# Patient Record
Sex: Female | Born: 1945 | Race: White | Hispanic: No | Smoking: Never smoker
Health system: Southern US, Community
[De-identification: ages and names within clinical notes are randomized; demographics above are authoritative.]

## PROBLEM LIST (undated history)

## (undated) DIAGNOSIS — R569 Unspecified convulsions: Secondary | ICD-10-CM

## (undated) DIAGNOSIS — T8859XA Other complications of anesthesia, initial encounter: Secondary | ICD-10-CM

## (undated) DIAGNOSIS — T7840XA Allergy, unspecified, initial encounter: Secondary | ICD-10-CM

## (undated) DIAGNOSIS — E119 Type 2 diabetes mellitus without complications: Secondary | ICD-10-CM

## (undated) DIAGNOSIS — K420 Umbilical hernia with obstruction, without gangrene: Secondary | ICD-10-CM

## (undated) DIAGNOSIS — R112 Nausea with vomiting, unspecified: Secondary | ICD-10-CM

## (undated) DIAGNOSIS — K56609 Unspecified intestinal obstruction, unspecified as to partial versus complete obstruction: Secondary | ICD-10-CM

## (undated) DIAGNOSIS — E785 Hyperlipidemia, unspecified: Secondary | ICD-10-CM

## (undated) DIAGNOSIS — C189 Malignant neoplasm of colon, unspecified: Secondary | ICD-10-CM

## (undated) DIAGNOSIS — I10 Essential (primary) hypertension: Secondary | ICD-10-CM

## (undated) DIAGNOSIS — E559 Vitamin D deficiency, unspecified: Secondary | ICD-10-CM

## (undated) DIAGNOSIS — M1710 Unilateral primary osteoarthritis, unspecified knee: Secondary | ICD-10-CM

## (undated) DIAGNOSIS — Z9889 Other specified postprocedural states: Secondary | ICD-10-CM

## (undated) HISTORY — DX: Allergy, unspecified, initial encounter: T78.40XA

## (undated) HISTORY — DX: Essential (primary) hypertension: I10

## (undated) HISTORY — DX: Vitamin D deficiency, unspecified: E55.9

## (undated) HISTORY — DX: Hyperlipidemia, unspecified: E78.5

## (undated) HISTORY — DX: Umbilical hernia with obstruction, without gangrene: K42.0

## (undated) HISTORY — DX: Unspecified convulsions: R56.9

## (undated) HISTORY — DX: Unspecified intestinal obstruction, unspecified as to partial versus complete obstruction: K56.609

## (undated) HISTORY — DX: Type 2 diabetes mellitus without complications: E11.9

---
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
## 2006-05-13 ENCOUNTER — Emergency Department: Payer: Self-pay | Admitting: Internal Medicine

## 2006-05-13 ENCOUNTER — Other Ambulatory Visit: Payer: Self-pay

## 2012-11-17 DIAGNOSIS — C189 Malignant neoplasm of colon, unspecified: Secondary | ICD-10-CM

## 2012-11-17 HISTORY — DX: Malignant neoplasm of colon, unspecified: C18.9

## 2013-02-24 ENCOUNTER — Ambulatory Visit: Payer: Self-pay | Admitting: Gastroenterology

## 2013-03-11 ENCOUNTER — Ambulatory Visit: Payer: Self-pay | Admitting: Radiation Oncology

## 2013-03-17 ENCOUNTER — Ambulatory Visit: Payer: Self-pay | Admitting: Radiation Oncology

## 2013-03-30 LAB — CBC CANCER CENTER
Basophil #: 0 x10 3/mm (ref 0.0–0.1)
Eosinophil #: 0 x10 3/mm (ref 0.0–0.7)
Eosinophil %: 1.1 %
HCT: 35.9 % (ref 35.0–47.0)
Lymphocyte #: 0.7 x10 3/mm — ABNORMAL LOW (ref 1.0–3.6)
MCH: 27.6 pg (ref 26.0–34.0)
MCHC: 32.4 g/dL (ref 32.0–36.0)
MCV: 85 fL (ref 80–100)
Monocyte #: 0.3 x10 3/mm (ref 0.2–0.9)
Neutrophil #: 3.2 x10 3/mm (ref 1.4–6.5)
Neutrophil %: 75.2 %
Platelet: 215 x10 3/mm (ref 150–440)
WBC: 4.3 x10 3/mm (ref 3.6–11.0)

## 2013-04-06 LAB — CBC CANCER CENTER
Basophil #: 0 x10 3/mm (ref 0.0–0.1)
Basophil %: 0.3 %
Eosinophil #: 0.1 x10 3/mm (ref 0.0–0.7)
Eosinophil %: 1.8 %
HGB: 11.6 g/dL — ABNORMAL LOW (ref 12.0–16.0)
Lymphocyte %: 11.1 %
MCH: 27.8 pg (ref 26.0–34.0)
MCHC: 32.4 g/dL (ref 32.0–36.0)
MCV: 86 fL (ref 80–100)
Neutrophil #: 3.2 x10 3/mm (ref 1.4–6.5)
Neutrophil %: 76 %
Platelet: 186 x10 3/mm (ref 150–440)
WBC: 4.2 x10 3/mm (ref 3.6–11.0)

## 2013-04-06 LAB — HEPATIC FUNCTION PANEL A (ARMC)
Albumin: 3.3 g/dL — ABNORMAL LOW (ref 3.4–5.0)
Alkaline Phosphatase: 118 U/L (ref 50–136)
Bilirubin,Total: 0.1 mg/dL — ABNORMAL LOW (ref 0.2–1.0)
SGPT (ALT): 18 U/L (ref 12–78)
Total Protein: 6.6 g/dL (ref 6.4–8.2)

## 2013-04-08 LAB — BASIC METABOLIC PANEL
Anion Gap: 9 (ref 7–16)
BUN: 9 mg/dL (ref 7–18)
Chloride: 101 mmol/L (ref 98–107)
EGFR (Non-African Amer.): 60 — ABNORMAL LOW
Glucose: 171 mg/dL — ABNORMAL HIGH (ref 65–99)
Osmolality: 278 (ref 275–301)
Potassium: 4.6 mmol/L (ref 3.5–5.1)
Sodium: 138 mmol/L (ref 136–145)

## 2013-04-13 LAB — MAGNESIUM: Magnesium: 1.8 mg/dL

## 2013-04-13 LAB — CBC CANCER CENTER
Basophil #: 0 x10 3/mm (ref 0.0–0.1)
Basophil %: 0.4 %
Eosinophil %: 2.7 %
HCT: 36.3 % (ref 35.0–47.0)
HGB: 11.9 g/dL — ABNORMAL LOW (ref 12.0–16.0)
Lymphocyte %: 8.5 %
MCH: 28.3 pg (ref 26.0–34.0)
Monocyte %: 9.8 %
Platelet: 177 x10 3/mm (ref 150–440)
WBC: 3.8 x10 3/mm (ref 3.6–11.0)

## 2013-04-13 LAB — COMPREHENSIVE METABOLIC PANEL
Alkaline Phosphatase: 122 U/L (ref 50–136)
Anion Gap: 12 (ref 7–16)
Co2: 25 mmol/L (ref 21–32)
Creatinine: 0.97 mg/dL (ref 0.60–1.30)
EGFR (African American): >60
Glucose: 185 mg/dL — ABNORMAL HIGH (ref 65–99)
Osmolality: 275 (ref 275–301)
SGPT (ALT): 18 U/L (ref 12–78)
Sodium: 136 mmol/L (ref 136–145)
Total Protein: 6.7 g/dL (ref 6.4–8.2)

## 2013-04-17 ENCOUNTER — Ambulatory Visit: Payer: Self-pay | Admitting: Radiation Oncology

## 2013-04-20 LAB — CBC CANCER CENTER
Basophil %: 0.4 %
Eosinophil %: 1.7 %
Lymphocyte %: 7.7 %
MCV: 88 fL (ref 80–100)
Monocyte #: 0.4 x10 3/mm (ref 0.2–0.9)
Neutrophil #: 2.9 x10 3/mm (ref 1.4–6.5)
Neutrophil %: 80.4 %
Platelet: 186 x10 3/mm (ref 150–440)
RBC: 3.99 10*6/uL (ref 3.80–5.20)
RDW: 15.7 % — ABNORMAL HIGH (ref 11.5–14.5)
WBC: 3.6 x10 3/mm (ref 3.6–11.0)

## 2013-04-20 LAB — HEPATIC FUNCTION PANEL A (ARMC)
Albumin: 3.4 g/dL (ref 3.4–5.0)
Bilirubin, Direct: 0.1 mg/dL (ref 0.00–0.20)
Total Protein: 6.6 g/dL (ref 6.4–8.2)

## 2013-04-20 LAB — CREATININE, SERUM
Creatinine: 1.06 mg/dL (ref 0.60–1.30)
EGFR (Non-African Amer.): 54 — ABNORMAL LOW

## 2013-04-27 LAB — CBC CANCER CENTER
Basophil %: 0.4 %
Eosinophil #: 0.1 x10 3/mm (ref 0.0–0.7)
Lymphocyte #: 0.2 x10 3/mm — ABNORMAL LOW (ref 1.0–3.6)
MCHC: 33.9 g/dL (ref 32.0–36.0)
Monocyte %: 12.2 %
Platelet: 198 x10 3/mm (ref 150–440)
WBC: 3.9 x10 3/mm (ref 3.6–11.0)

## 2013-05-04 LAB — CBC CANCER CENTER
Basophil %: 0.5 %
HCT: 36.2 % (ref 35.0–47.0)
Lymphocyte %: 4.9 %
MCH: 30.6 pg (ref 26.0–34.0)
MCV: 91 fL (ref 80–100)
Monocyte #: 0.4 x10 3/mm (ref 0.2–0.9)
Neutrophil #: 3.1 x10 3/mm (ref 1.4–6.5)
Platelet: 223 x10 3/mm (ref 150–440)
RDW: 18.5 % — ABNORMAL HIGH (ref 11.5–14.5)

## 2013-05-04 LAB — MAGNESIUM: Magnesium: 1.7 mg/dL — ABNORMAL LOW

## 2013-05-04 LAB — POTASSIUM: Potassium: 4 mmol/L (ref 3.5–5.1)

## 2013-05-04 LAB — HEPATIC FUNCTION PANEL A (ARMC)
Bilirubin, Direct: 0.1 mg/dL (ref 0.00–0.20)
SGPT (ALT): 19 U/L (ref 12–78)
Total Protein: 6.8 g/dL (ref 6.4–8.2)

## 2013-05-04 LAB — CREATININE, SERUM: EGFR (African American): >60

## 2013-05-17 ENCOUNTER — Ambulatory Visit: Payer: Self-pay | Admitting: Radiation Oncology

## 2013-05-17 HISTORY — PX: ILEOSTOMY: SHX1783

## 2013-06-13 DIAGNOSIS — G40909 Epilepsy, unspecified, not intractable, without status epilepticus: Secondary | ICD-10-CM | POA: Insufficient documentation

## 2013-06-17 ENCOUNTER — Ambulatory Visit: Payer: Self-pay | Admitting: Radiation Oncology

## 2013-07-27 DIAGNOSIS — N179 Acute kidney failure, unspecified: Secondary | ICD-10-CM | POA: Insufficient documentation

## 2013-09-29 ENCOUNTER — Ambulatory Visit: Payer: Self-pay | Admitting: Internal Medicine

## 2013-10-17 ENCOUNTER — Ambulatory Visit: Payer: Self-pay | Admitting: Internal Medicine

## 2013-11-24 DIAGNOSIS — C20 Malignant neoplasm of rectum: Secondary | ICD-10-CM | POA: Diagnosis not present

## 2013-12-14 DIAGNOSIS — C20 Malignant neoplasm of rectum: Secondary | ICD-10-CM | POA: Diagnosis not present

## 2013-12-14 DIAGNOSIS — Z932 Ileostomy status: Secondary | ICD-10-CM | POA: Diagnosis not present

## 2014-01-15 HISTORY — PX: COLOSTOMY REVERSAL: SHX5782

## 2014-01-19 DIAGNOSIS — Z8719 Personal history of other diseases of the digestive system: Secondary | ICD-10-CM | POA: Diagnosis not present

## 2014-01-19 DIAGNOSIS — C20 Malignant neoplasm of rectum: Secondary | ICD-10-CM | POA: Diagnosis not present

## 2014-01-19 DIAGNOSIS — K769 Liver disease, unspecified: Secondary | ICD-10-CM | POA: Diagnosis not present

## 2014-01-19 DIAGNOSIS — Z9889 Other specified postprocedural states: Secondary | ICD-10-CM | POA: Diagnosis not present

## 2014-01-19 DIAGNOSIS — R944 Abnormal results of kidney function studies: Secondary | ICD-10-CM | POA: Diagnosis not present

## 2014-01-24 DIAGNOSIS — F40298 Other specified phobia: Secondary | ICD-10-CM | POA: Diagnosis not present

## 2014-01-24 DIAGNOSIS — G40909 Epilepsy, unspecified, not intractable, without status epilepticus: Secondary | ICD-10-CM | POA: Diagnosis not present

## 2014-01-24 DIAGNOSIS — F411 Generalized anxiety disorder: Secondary | ICD-10-CM | POA: Diagnosis not present

## 2014-01-24 DIAGNOSIS — Z932 Ileostomy status: Secondary | ICD-10-CM | POA: Diagnosis not present

## 2014-01-24 DIAGNOSIS — E785 Hyperlipidemia, unspecified: Secondary | ICD-10-CM | POA: Diagnosis not present

## 2014-01-24 DIAGNOSIS — I10 Essential (primary) hypertension: Secondary | ICD-10-CM | POA: Diagnosis not present

## 2014-01-24 DIAGNOSIS — C20 Malignant neoplasm of rectum: Secondary | ICD-10-CM | POA: Diagnosis not present

## 2014-01-24 DIAGNOSIS — E119 Type 2 diabetes mellitus without complications: Secondary | ICD-10-CM | POA: Diagnosis not present

## 2014-01-24 DIAGNOSIS — Z0389 Encounter for observation for other suspected diseases and conditions ruled out: Secondary | ICD-10-CM | POA: Diagnosis not present

## 2014-01-27 DIAGNOSIS — Z932 Ileostomy status: Secondary | ICD-10-CM | POA: Diagnosis not present

## 2014-01-27 DIAGNOSIS — Z85048 Personal history of other malignant neoplasm of rectum, rectosigmoid junction, and anus: Secondary | ICD-10-CM | POA: Diagnosis not present

## 2014-01-31 DIAGNOSIS — E785 Hyperlipidemia, unspecified: Secondary | ICD-10-CM | POA: Diagnosis present

## 2014-01-31 DIAGNOSIS — Z432 Encounter for attention to ileostomy: Secondary | ICD-10-CM | POA: Diagnosis not present

## 2014-01-31 DIAGNOSIS — C20 Malignant neoplasm of rectum: Secondary | ICD-10-CM | POA: Diagnosis not present

## 2014-01-31 DIAGNOSIS — I10 Essential (primary) hypertension: Secondary | ICD-10-CM | POA: Diagnosis not present

## 2014-01-31 DIAGNOSIS — Z932 Ileostomy status: Secondary | ICD-10-CM | POA: Diagnosis not present

## 2014-01-31 DIAGNOSIS — Z85038 Personal history of other malignant neoplasm of large intestine: Secondary | ICD-10-CM | POA: Diagnosis not present

## 2014-01-31 DIAGNOSIS — Z85048 Personal history of other malignant neoplasm of rectum, rectosigmoid junction, and anus: Secondary | ICD-10-CM | POA: Diagnosis not present

## 2014-01-31 DIAGNOSIS — E119 Type 2 diabetes mellitus without complications: Secondary | ICD-10-CM | POA: Diagnosis not present

## 2014-01-31 DIAGNOSIS — K219 Gastro-esophageal reflux disease without esophagitis: Secondary | ICD-10-CM | POA: Diagnosis present

## 2014-01-31 DIAGNOSIS — J309 Allergic rhinitis, unspecified: Secondary | ICD-10-CM | POA: Diagnosis present

## 2014-01-31 DIAGNOSIS — F411 Generalized anxiety disorder: Secondary | ICD-10-CM | POA: Diagnosis present

## 2014-01-31 DIAGNOSIS — R569 Unspecified convulsions: Secondary | ICD-10-CM | POA: Diagnosis present

## 2014-01-31 DIAGNOSIS — F40298 Other specified phobia: Secondary | ICD-10-CM | POA: Diagnosis present

## 2014-02-05 DIAGNOSIS — Z85048 Personal history of other malignant neoplasm of rectum, rectosigmoid junction, and anus: Secondary | ICD-10-CM | POA: Diagnosis not present

## 2014-02-05 DIAGNOSIS — F411 Generalized anxiety disorder: Secondary | ICD-10-CM | POA: Diagnosis not present

## 2014-02-05 DIAGNOSIS — K439 Ventral hernia without obstruction or gangrene: Secondary | ICD-10-CM | POA: Diagnosis not present

## 2014-02-05 DIAGNOSIS — K560 Paralytic ileus: Secondary | ICD-10-CM | POA: Diagnosis not present

## 2014-02-05 DIAGNOSIS — R509 Fever, unspecified: Secondary | ICD-10-CM | POA: Diagnosis not present

## 2014-02-05 DIAGNOSIS — Z9889 Other specified postprocedural states: Secondary | ICD-10-CM | POA: Diagnosis not present

## 2014-02-05 DIAGNOSIS — K7689 Other specified diseases of liver: Secondary | ICD-10-CM | POA: Diagnosis not present

## 2014-02-05 DIAGNOSIS — R197 Diarrhea, unspecified: Secondary | ICD-10-CM | POA: Diagnosis present

## 2014-02-05 DIAGNOSIS — F40298 Other specified phobia: Secondary | ICD-10-CM | POA: Diagnosis present

## 2014-02-05 DIAGNOSIS — I10 Essential (primary) hypertension: Secondary | ICD-10-CM | POA: Diagnosis present

## 2014-02-05 DIAGNOSIS — E119 Type 2 diabetes mellitus without complications: Secondary | ICD-10-CM | POA: Diagnosis not present

## 2014-02-05 DIAGNOSIS — R109 Unspecified abdominal pain: Secondary | ICD-10-CM | POA: Diagnosis not present

## 2014-02-05 DIAGNOSIS — G40909 Epilepsy, unspecified, not intractable, without status epilepticus: Secondary | ICD-10-CM | POA: Diagnosis not present

## 2014-02-05 DIAGNOSIS — J9819 Other pulmonary collapse: Secondary | ICD-10-CM | POA: Diagnosis not present

## 2014-02-05 DIAGNOSIS — R918 Other nonspecific abnormal finding of lung field: Secondary | ICD-10-CM | POA: Diagnosis not present

## 2014-02-05 DIAGNOSIS — K56609 Unspecified intestinal obstruction, unspecified as to partial versus complete obstruction: Secondary | ICD-10-CM | POA: Diagnosis not present

## 2014-02-05 DIAGNOSIS — E785 Hyperlipidemia, unspecified: Secondary | ICD-10-CM | POA: Diagnosis not present

## 2014-02-05 DIAGNOSIS — R112 Nausea with vomiting, unspecified: Secondary | ICD-10-CM | POA: Diagnosis not present

## 2014-02-05 DIAGNOSIS — J90 Pleural effusion, not elsewhere classified: Secondary | ICD-10-CM | POA: Diagnosis not present

## 2014-03-03 DIAGNOSIS — C20 Malignant neoplasm of rectum: Secondary | ICD-10-CM | POA: Diagnosis not present

## 2014-03-16 DIAGNOSIS — Z01419 Encounter for gynecological examination (general) (routine) without abnormal findings: Secondary | ICD-10-CM | POA: Diagnosis not present

## 2014-03-16 DIAGNOSIS — L259 Unspecified contact dermatitis, unspecified cause: Secondary | ICD-10-CM | POA: Diagnosis not present

## 2014-04-11 ENCOUNTER — Ambulatory Visit: Payer: Self-pay | Admitting: Radiation Oncology

## 2014-04-12 DIAGNOSIS — C20 Malignant neoplasm of rectum: Secondary | ICD-10-CM | POA: Diagnosis not present

## 2014-04-20 DIAGNOSIS — C20 Malignant neoplasm of rectum: Secondary | ICD-10-CM | POA: Diagnosis not present

## 2014-04-20 DIAGNOSIS — C19 Malignant neoplasm of rectosigmoid junction: Secondary | ICD-10-CM | POA: Diagnosis not present

## 2014-04-25 DIAGNOSIS — M810 Age-related osteoporosis without current pathological fracture: Secondary | ICD-10-CM | POA: Diagnosis not present

## 2014-04-25 DIAGNOSIS — Z1231 Encounter for screening mammogram for malignant neoplasm of breast: Secondary | ICD-10-CM | POA: Diagnosis not present

## 2014-05-05 DIAGNOSIS — I10 Essential (primary) hypertension: Secondary | ICD-10-CM | POA: Diagnosis not present

## 2014-05-05 DIAGNOSIS — IMO0002 Reserved for concepts with insufficient information to code with codable children: Secondary | ICD-10-CM | POA: Diagnosis not present

## 2014-05-05 DIAGNOSIS — E785 Hyperlipidemia, unspecified: Secondary | ICD-10-CM | POA: Diagnosis not present

## 2014-05-05 DIAGNOSIS — E119 Type 2 diabetes mellitus without complications: Secondary | ICD-10-CM | POA: Diagnosis not present

## 2014-05-05 DIAGNOSIS — M1710 Unilateral primary osteoarthritis, unspecified knee: Secondary | ICD-10-CM | POA: Diagnosis not present

## 2014-06-02 DIAGNOSIS — M1710 Unilateral primary osteoarthritis, unspecified knee: Secondary | ICD-10-CM | POA: Diagnosis not present

## 2014-06-14 DIAGNOSIS — M25569 Pain in unspecified knee: Secondary | ICD-10-CM | POA: Diagnosis not present

## 2014-06-14 DIAGNOSIS — M1710 Unilateral primary osteoarthritis, unspecified knee: Secondary | ICD-10-CM | POA: Diagnosis not present

## 2014-06-14 DIAGNOSIS — R262 Difficulty in walking, not elsewhere classified: Secondary | ICD-10-CM | POA: Diagnosis not present

## 2014-06-16 DIAGNOSIS — M1710 Unilateral primary osteoarthritis, unspecified knee: Secondary | ICD-10-CM | POA: Diagnosis not present

## 2014-06-16 DIAGNOSIS — M25569 Pain in unspecified knee: Secondary | ICD-10-CM | POA: Diagnosis not present

## 2014-06-16 DIAGNOSIS — R262 Difficulty in walking, not elsewhere classified: Secondary | ICD-10-CM | POA: Diagnosis not present

## 2014-06-17 DIAGNOSIS — R262 Difficulty in walking, not elsewhere classified: Secondary | ICD-10-CM | POA: Diagnosis not present

## 2014-06-17 DIAGNOSIS — M25569 Pain in unspecified knee: Secondary | ICD-10-CM | POA: Diagnosis not present

## 2014-06-17 DIAGNOSIS — M1710 Unilateral primary osteoarthritis, unspecified knee: Secondary | ICD-10-CM | POA: Diagnosis not present

## 2014-06-30 DIAGNOSIS — Z1211 Encounter for screening for malignant neoplasm of colon: Secondary | ICD-10-CM | POA: Diagnosis not present

## 2014-06-30 DIAGNOSIS — Z85048 Personal history of other malignant neoplasm of rectum, rectosigmoid junction, and anus: Secondary | ICD-10-CM | POA: Diagnosis not present

## 2014-06-30 DIAGNOSIS — C20 Malignant neoplasm of rectum: Secondary | ICD-10-CM | POA: Diagnosis not present

## 2014-07-12 DIAGNOSIS — H2510 Age-related nuclear cataract, unspecified eye: Secondary | ICD-10-CM | POA: Diagnosis not present

## 2014-07-27 DIAGNOSIS — C19 Malignant neoplasm of rectosigmoid junction: Secondary | ICD-10-CM | POA: Diagnosis not present

## 2014-07-27 DIAGNOSIS — I10 Essential (primary) hypertension: Secondary | ICD-10-CM | POA: Diagnosis not present

## 2014-07-27 DIAGNOSIS — Z85048 Personal history of other malignant neoplasm of rectum, rectosigmoid junction, and anus: Secondary | ICD-10-CM | POA: Diagnosis not present

## 2014-07-27 DIAGNOSIS — Z09 Encounter for follow-up examination after completed treatment for conditions other than malignant neoplasm: Secondary | ICD-10-CM | POA: Diagnosis not present

## 2014-07-27 DIAGNOSIS — R9389 Abnormal findings on diagnostic imaging of other specified body structures: Secondary | ICD-10-CM | POA: Diagnosis not present

## 2014-07-27 DIAGNOSIS — K7689 Other specified diseases of liver: Secondary | ICD-10-CM | POA: Diagnosis not present

## 2014-08-07 DIAGNOSIS — S139XXA Sprain of joints and ligaments of unspecified parts of neck, initial encounter: Secondary | ICD-10-CM | POA: Diagnosis not present

## 2014-08-07 DIAGNOSIS — M5030 Other cervical disc degeneration, unspecified cervical region: Secondary | ICD-10-CM | POA: Diagnosis not present

## 2014-08-11 DIAGNOSIS — IMO0002 Reserved for concepts with insufficient information to code with codable children: Secondary | ICD-10-CM | POA: Diagnosis not present

## 2014-08-11 DIAGNOSIS — Z5189 Encounter for other specified aftercare: Secondary | ICD-10-CM | POA: Diagnosis not present

## 2014-08-22 DIAGNOSIS — M5032 Other cervical disc degeneration, mid-cervical region: Secondary | ICD-10-CM | POA: Diagnosis not present

## 2014-08-22 DIAGNOSIS — S134XXD Sprain of ligaments of cervical spine, subsequent encounter: Secondary | ICD-10-CM | POA: Diagnosis not present

## 2014-09-22 DIAGNOSIS — E1165 Type 2 diabetes mellitus with hyperglycemia: Secondary | ICD-10-CM | POA: Diagnosis not present

## 2014-09-22 DIAGNOSIS — E559 Vitamin D deficiency, unspecified: Secondary | ICD-10-CM | POA: Diagnosis not present

## 2014-09-22 DIAGNOSIS — Z23 Encounter for immunization: Secondary | ICD-10-CM | POA: Diagnosis not present

## 2014-09-22 DIAGNOSIS — C189 Malignant neoplasm of colon, unspecified: Secondary | ICD-10-CM | POA: Diagnosis not present

## 2014-09-22 DIAGNOSIS — I10 Essential (primary) hypertension: Secondary | ICD-10-CM | POA: Diagnosis not present

## 2014-10-06 DIAGNOSIS — C20 Malignant neoplasm of rectum: Secondary | ICD-10-CM | POA: Diagnosis not present

## 2014-12-19 DIAGNOSIS — Z23 Encounter for immunization: Secondary | ICD-10-CM | POA: Diagnosis not present

## 2015-01-25 DIAGNOSIS — C189 Malignant neoplasm of colon, unspecified: Secondary | ICD-10-CM | POA: Diagnosis not present

## 2015-01-25 DIAGNOSIS — M25569 Pain in unspecified knee: Secondary | ICD-10-CM | POA: Diagnosis not present

## 2015-01-25 DIAGNOSIS — Z85048 Personal history of other malignant neoplasm of rectum, rectosigmoid junction, and anus: Secondary | ICD-10-CM | POA: Diagnosis not present

## 2015-01-25 DIAGNOSIS — G8929 Other chronic pain: Secondary | ICD-10-CM | POA: Diagnosis not present

## 2015-01-25 DIAGNOSIS — K5900 Constipation, unspecified: Secondary | ICD-10-CM | POA: Diagnosis not present

## 2015-02-02 DIAGNOSIS — Z85048 Personal history of other malignant neoplasm of rectum, rectosigmoid junction, and anus: Secondary | ICD-10-CM | POA: Diagnosis not present

## 2015-02-02 DIAGNOSIS — C20 Malignant neoplasm of rectum: Secondary | ICD-10-CM | POA: Diagnosis not present

## 2015-02-21 DIAGNOSIS — C189 Malignant neoplasm of colon, unspecified: Secondary | ICD-10-CM | POA: Diagnosis not present

## 2015-02-21 DIAGNOSIS — M1712 Unilateral primary osteoarthritis, left knee: Secondary | ICD-10-CM | POA: Diagnosis not present

## 2015-02-21 DIAGNOSIS — E782 Mixed hyperlipidemia: Secondary | ICD-10-CM | POA: Diagnosis not present

## 2015-02-21 DIAGNOSIS — E1165 Type 2 diabetes mellitus with hyperglycemia: Secondary | ICD-10-CM | POA: Diagnosis not present

## 2015-02-21 DIAGNOSIS — I10 Essential (primary) hypertension: Secondary | ICD-10-CM | POA: Diagnosis not present

## 2015-02-21 DIAGNOSIS — E1129 Type 2 diabetes mellitus with other diabetic kidney complication: Secondary | ICD-10-CM | POA: Diagnosis not present

## 2015-02-21 DIAGNOSIS — G40909 Epilepsy, unspecified, not intractable, without status epilepticus: Secondary | ICD-10-CM | POA: Diagnosis not present

## 2015-03-09 NOTE — Consult Note (Signed)
Reason for Visit: This 69 year old Female patient presents to the clinic for initial evaluation of  rectal cancer .   Referred by Dr. Candace Cruise.  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 She also had intermittent diarrhea and constipation was found to be anemic and guaiac stool positive. Was seen by gastroenterology and underwent lower endoscopy showing a polypoid partially obstructing large mass in the distal sigmoid colon biopsy was positive for adenocarcinoma moderately well-differentiated. She worked up at [employer] of CT scan showing mild wall thickening at the rectosigmoid junction corresponding to site of probable tumor involvement. She also is noted to have a 1.2 cm low-attenuation lesion in the right hepatic lobe and then is being worked up with MRI scan tomorrow to rule out metastatic disease.she did have an endoscopic ultrasound and was clinically staged as a T3 N0 lesion. There also determined lesion was about 13 cm from the anal verge. GI surgery at [facility] has recommended preop chemoradiation. She is now referred to radiation oncology for opinion.she is doing fairly well although was quite nervous at this time.  Past Hx:    Rectal cancer:    Seizures:    HTN:    Diabetes:   Past, Family and Social History:  Past Medical History positive   Cardiovascular hypertension   Endocrine diabetes mellitus   Family History positive   Family History Comments mother had metastatic disease to her liver died of an early age possible  rectal cancer will have genetic counseling assessment   Social History noncontributory   Additional Past Medical and Surgical History accompanied by son today   Allergies:   Actos: N/V/Diarrhea  Home Meds:  Home Medications: Medication Instructions Status  ramipril 2.5 mg oral capsule 1 cap(s) orally once a day Active  Levemir FlexPen 100 units/mL subcutaneous solution 15 unit(s) subcutaneous once a day (at bedtime) Active  Glucovance 5 mg-500 mg oral tablet 1 tab(s) orally 2 times a day Active  ferrous sulfate 325 mg oral tablet 1 tab(s) orally once a day Active  carBAMazepine 200 mg oral tablet 1 tab(s) orally once a day Active  bisoprolol 5 mg oral tablet 1 tab(s) orally once a day Active  atorvastatin 40 mg oral tablet 1 tab(s) orally once a day (at bedtime) Active   Review of Systems:  General negative   Performance Status (ECOG) 0   Skin negative   Breast negative   ENMT negative   Respiratory and Thorax negative   Cardiovascular negative   Gastrointestinal see HPI   Genitourinary negative   Musculoskeletal negative   Neurological negative   Psychiatric negative   Hematology/Lymphatics negative   Endocrine negative   Allergic/Immunologic negative   Review of Systems   except for the intermittent diarrhea constipation blood per direct them according to the nurse's notesPatient denies any weight loss, fatigue, weakness, fever, chills or night sweats. Patient denies any loss of vision, blurred vision. Patient denies any ringing  of the ears or hearing loss. No irregular heartbeat. Patient denies heart murmur or history of fainting. Patient denies any  chest pain or pain radiating to her upper extremities. Patient denies any shortness of breath, difficulty breathing at night, cough or hemoptysis. Patient denies any swelling in the lower legs. Patient denies any nausea vomiting, vomiting of blood, or coffee ground material in the vomitus. Patient denies any  stomach pain. Patient states has had normal bowel movements no significant constipation or diarrhea. Patient denies any dysuria, hematuria or significant nocturia. Patient denies any problems walking, swelling in the joints or loss of balance. Patient denies any skin changes, loss of hair or loss of weight. Patient denies any excessive worrying or anxiety or significant depression. Patient denies any problems with insomnia. Patient denies excessive thirst, polyuria, polydipsia. Patient denies any swollen glands, patient denies easy bruising or easy bleeding. Patient denies any recent infections, allergies or URI. Patient "s visual fields have not changed significantly in recent time.  Nursing Notes:  Nursing Vital Signs and Chemo Nursing Nursing Notes: *CC Vital Signs Flowsheet:   25-Apr-14 09:27  Temp Temperature 96.8  Pulse Pulse 53  Respirations Respirations 21  SBP SBP 170  DBP DBP 81  Current Weight (kg) (kg) 76.3   Physical Exam:  General/Skin/HEENT:  General normal   Skin normal   Eyes normal   ENMT normal   Head and Neck normal   Additional PE well-developed slightly obese female in NAD. Lungs are clear to A&P cardiac examination shows regular rate and rhythm. Abdomen is benign with no organomegaly or masses noted. No inguinal adenopathy is identified. No significant peripheral edema is noted in her lower extremities.   Breasts/Resp/CV/GI/GU:  Respiratory and Thorax normal   Cardiovascular normal   Gastrointestinal normal   Genitourinary normal   MS/Neuro/Psych/Lymph:  Musculoskeletal normal   Neurological normal   Lymphatics normal   Relevent Results:   Relevant Scans and Labs CT scan and MRI scan from [facility] have been requested for my review   Assessment and Plan: Impression:   probable stage IIa adenocarcinoma of the rectum in 69 year old female. Plan:   certainly her overall preoperative chemotherapy radiation regimen will depend on whether MRI is valid  dates whether there is metastatic disease in the liver. I would make for stage IV disease and at that point would defer chemoradiation in a preoperative mode. If the liver lesion turns out to be an incidental finding and not malignant we'll go ahead with preoperative chemotherapy up to 5500 cGy over 5 weeks with concurrent chemotherapy. I have set her up for evaluation by medical oncology early next week. I've also requested copies of the reports and CT and MRI scans for my review early next week prior to simulation. Risks and benefits of treatment including diarrhea, possible your urinary frequency and urgency, skin reaction, alteration blood counts all explained in detail to the patient and her son. They both seem to comprehend my treatment plan well. Have set her up for CT simulation the middle of next week. Also discuss the case personally with Dr. Cynda Acres.  I would like to take this opportunity to thank you for allowing me to continue to participate in this patient's care.  CC Referral:  cc: Dr.Oh, Dr. Domenick Gong Dr. Ivette [facility], Dr. Roxy [employer]   Electronic Signatures: Baruch Gouty, Roda Shutters (MD)  (Signed 25-Apr-14 11:18)  Authored: HPI, Diagnosis, Past Hx, PFSH, Allergies, Home Meds, ROS, Nursing Notes, Physical Exam, Relevent Results, Encounter Assessment and Plan, CC Referring Physician   Last Updated: 25-Apr-14 11:18 by Armstead Peaks (MD)

## 2015-03-20 DIAGNOSIS — Z01419 Encounter for gynecological examination (general) (routine) without abnormal findings: Secondary | ICD-10-CM | POA: Diagnosis not present

## 2015-05-04 DIAGNOSIS — Z79899 Other long term (current) drug therapy: Secondary | ICD-10-CM | POA: Diagnosis not present

## 2015-05-04 DIAGNOSIS — E1165 Type 2 diabetes mellitus with hyperglycemia: Secondary | ICD-10-CM | POA: Diagnosis not present

## 2015-05-04 DIAGNOSIS — Z794 Long term (current) use of insulin: Secondary | ICD-10-CM | POA: Diagnosis not present

## 2015-05-04 DIAGNOSIS — I10 Essential (primary) hypertension: Secondary | ICD-10-CM | POA: Diagnosis not present

## 2015-05-04 DIAGNOSIS — Z1231 Encounter for screening mammogram for malignant neoplasm of breast: Secondary | ICD-10-CM | POA: Diagnosis not present

## 2015-05-04 DIAGNOSIS — Z85048 Personal history of other malignant neoplasm of rectum, rectosigmoid junction, and anus: Secondary | ICD-10-CM | POA: Diagnosis not present

## 2015-05-08 ENCOUNTER — Encounter: Payer: Self-pay | Admitting: Internal Medicine

## 2015-05-08 DIAGNOSIS — M1710 Unilateral primary osteoarthritis, unspecified knee: Secondary | ICD-10-CM

## 2015-05-08 DIAGNOSIS — E559 Vitamin D deficiency, unspecified: Secondary | ICD-10-CM | POA: Insufficient documentation

## 2015-05-08 DIAGNOSIS — Z85048 Personal history of other malignant neoplasm of rectum, rectosigmoid junction, and anus: Secondary | ICD-10-CM | POA: Insufficient documentation

## 2015-05-08 DIAGNOSIS — M179 Osteoarthritis of knee, unspecified: Secondary | ICD-10-CM

## 2015-05-08 DIAGNOSIS — S46919A Strain of unspecified muscle, fascia and tendon at shoulder and upper arm level, unspecified arm, initial encounter: Secondary | ICD-10-CM | POA: Insufficient documentation

## 2015-05-08 DIAGNOSIS — I6789 Other cerebrovascular disease: Secondary | ICD-10-CM

## 2015-05-08 DIAGNOSIS — I10 Essential (primary) hypertension: Secondary | ICD-10-CM | POA: Insufficient documentation

## 2015-05-08 DIAGNOSIS — E785 Hyperlipidemia, unspecified: Secondary | ICD-10-CM

## 2015-05-08 DIAGNOSIS — G40909 Epilepsy, unspecified, not intractable, without status epilepticus: Secondary | ICD-10-CM | POA: Insufficient documentation

## 2015-05-08 DIAGNOSIS — E1169 Type 2 diabetes mellitus with other specified complication: Secondary | ICD-10-CM | POA: Insufficient documentation

## 2015-05-08 HISTORY — DX: Osteoarthritis of knee, unspecified: M17.9

## 2015-05-08 HISTORY — DX: Unilateral primary osteoarthritis, unspecified knee: M17.10

## 2015-05-22 ENCOUNTER — Other Ambulatory Visit: Payer: Self-pay | Admitting: Internal Medicine

## 2015-06-27 ENCOUNTER — Encounter: Payer: Self-pay | Admitting: Family Medicine

## 2015-06-27 ENCOUNTER — Encounter: Payer: Self-pay | Admitting: Internal Medicine

## 2015-06-27 ENCOUNTER — Ambulatory Visit (INDEPENDENT_AMBULATORY_CARE_PROVIDER_SITE_OTHER): Payer: Medicare Other | Admitting: Family Medicine

## 2015-06-27 ENCOUNTER — Ambulatory Visit: Payer: Self-pay | Admitting: Internal Medicine

## 2015-06-27 VITALS — BP 120/70 | HR 60 | Ht 62.0 in | Wt 162.0 lb

## 2015-06-27 DIAGNOSIS — E1165 Type 2 diabetes mellitus with hyperglycemia: Secondary | ICD-10-CM | POA: Diagnosis not present

## 2015-06-27 DIAGNOSIS — IMO0002 Reserved for concepts with insufficient information to code with codable children: Secondary | ICD-10-CM

## 2015-06-27 NOTE — Progress Notes (Signed)
Name: [name]   MRN: [id number]    DOB: 1945/12/25   Date:06/27/2015       Progress Note  Subjective  Chief Complaint  Chief Complaint  Patient presents with  . Diabetes    Diabetes She presents for her follow-up diabetic visit. She has type 2 diabetes mellitus. Her disease course has been fluctuating. Hypoglycemia symptoms include nervousness/anxiousness. Pertinent negatives for hypoglycemia include no confusion, dizziness, headaches, hunger, mood changes, pallor, seizures, sleepiness, speech difficulty, sweats or tremors. Associated symptoms include polyuria. Pertinent negatives for diabetes include no blurred vision, no chest pain, no fatigue, no foot paresthesias, no foot ulcerations, no polydipsia, no polyphagia, no visual change, no weakness and no weight loss. (Nocturia) There are no hypoglycemic complications. Pertinent negatives for hypoglycemia complications include no blackouts and no nocturnal hypoglycemia. Symptoms are stable. Pertinent negatives for diabetic complications include no autonomic neuropathy, CVA, heart disease, peripheral neuropathy or PVD. Risk factors for coronary artery disease include diabetes mellitus. Current diabetic treatment includes insulin injections and oral agent (monotherapy). She is compliant with treatment all of the time. She is currently taking insulin at bedtime (15 units). Insulin injections are given by patient. Her weight is stable. She is following a generally healthy diet. Her home blood glucose trend is increasing steadily. Her breakfast blood glucose is taken between 8-9 am. Her breakfast blood glucose range is generally 140-180 mg/dl.    No problem-specific assessment & plan notes found for this encounter.   Past Medical History  Diagnosis Date  . Diabetes mellitus without complication   . Hypertension   . Seizures     Past Surgical History  Procedure Laterality Date  . Ileostomy    . Colostomy reversal      Family History   Problem Relation Age of Onset  . Cancer Mother     Social History   Social History  . Marital Status: Married    Spouse Name: N/A  . Number of Children: N/A  . Years of Education: N/A   Occupational History  . Not on file.   Social History Main Topics  . Smoking status: Never Smoker   . Smokeless tobacco: Not on file  . Alcohol Use: No  . Drug Use: No  . Sexual Activity: No   Other Topics Concern  . Not on file   Social History Narrative    Allergies  Allergen Reactions  . Pioglitazone Nausea Only     Review of Systems  Constitutional: Negative for fever, chills, weight loss, malaise/fatigue and fatigue.  HENT: Negative for ear discharge, ear pain and sore throat.   Eyes: Negative for blurred vision.  Respiratory: Negative for cough, sputum production, shortness of breath and wheezing.   Cardiovascular: Negative for chest pain, palpitations and leg swelling.  Gastrointestinal: Negative for heartburn, nausea, abdominal pain, diarrhea, constipation, blood in stool and melena.  Genitourinary: Negative for dysuria, urgency, frequency and hematuria.  Musculoskeletal: Negative for myalgias, back pain, joint pain and neck pain.  Skin: Negative for pallor and rash.  Neurological: Negative for dizziness, tingling, tremors, sensory change, focal weakness, seizures, speech difficulty, weakness and headaches.  Endo/Heme/Allergies: Negative for environmental allergies, polydipsia and polyphagia. Does not bruise/bleed easily.  Psychiatric/Behavioral: Negative for depression, suicidal ideas and confusion. The patient is nervous/anxious. The patient does not have insomnia.      Objective  Filed Vitals:   06/27/15 1045  BP: 120/70  Pulse: 60  Height: 5\' 2"  (1.575 m)  Weight: 162 lb (73.483 kg)  Physical Exam  Constitutional: She is well-developed, well-nourished, and in no distress. No distress.  HENT:  Head: Normocephalic and atraumatic.  Right Ear: External ear  normal.  Left Ear: External ear normal.  Nose: Nose normal.  Mouth/Throat: Oropharynx is clear and moist.  Eyes: Conjunctivae and EOM are normal. Pupils are equal, round, and reactive to light. Right eye exhibits no discharge. Left eye exhibits no discharge.  Neck: Normal range of motion. Neck supple. No JVD present. No thyromegaly present.  Cardiovascular: Normal rate, regular rhythm, normal heart sounds and intact distal pulses.  Exam reveals no gallop and no friction rub.   No murmur heard. Pulmonary/Chest: Effort normal and breath sounds normal.  Abdominal: Soft. Bowel sounds are normal. She exhibits no mass. There is no tenderness. There is no guarding.  Musculoskeletal: Normal range of motion. She exhibits no edema.  Lymphadenopathy:    She has no cervical adenopathy.  Neurological: She is alert. She has normal reflexes.  Skin: Skin is warm and dry. She is not diaphoretic.  Psychiatric: Mood and affect normal.      Assessment & Plan  Problem List Items Addressed This Visit      Other   Diabetes mellitus type 2, uncontrolled - Primary   Relevant Medications   aspirin 81 MG tablet   Other Relevant Orders   Renal Function Panel   HgB A1c        Dr. [facility]  06/27/2015

## 2015-06-28 LAB — RENAL FUNCTION PANEL
ALBUMIN: 4.4 g/dL (ref 3.6–4.8)
BUN / CREAT RATIO: 22 (ref 11–26)
BUN: 22 mg/dL (ref 8–27)
CO2: 27 mmol/L (ref 18–29)
Calcium: 9.4 mg/dL (ref 8.7–10.3)
Chloride: 97 mmol/L (ref 97–108)
Creatinine, Ser: 0.98 mg/dL (ref 0.57–1.00)
GFR, EST AFRICAN AMERICAN: 68 mL/min/{1.73_m2} (ref >59)
GFR, EST NON AFRICAN AMERICAN: 59 mL/min/{1.73_m2} — AB (ref >59)
GLUCOSE: 226 mg/dL — AB (ref 65–99)
PHOSPHORUS: 3.1 mg/dL (ref 2.5–4.5)
Potassium: 5 mmol/L (ref 3.5–5.2)
Sodium: 140 mmol/L (ref 134–144)

## 2015-06-28 LAB — HEMOGLOBIN A1C
Est. average glucose Bld gHb Est-mCnc: 232 mg/dL
HEMOGLOBIN A1C: 9.7 % — AB (ref 4.8–5.6)

## 2015-07-09 ENCOUNTER — Encounter: Payer: Self-pay | Admitting: Family Medicine

## 2015-07-09 ENCOUNTER — Ambulatory Visit (INDEPENDENT_AMBULATORY_CARE_PROVIDER_SITE_OTHER): Payer: Medicare Other | Admitting: Family Medicine

## 2015-07-09 VITALS — BP 130/64 | HR 64 | Ht 62.0 in | Wt 162.0 lb

## 2015-07-09 DIAGNOSIS — E1165 Type 2 diabetes mellitus with hyperglycemia: Secondary | ICD-10-CM

## 2015-07-09 DIAGNOSIS — E782 Mixed hyperlipidemia: Secondary | ICD-10-CM | POA: Diagnosis not present

## 2015-07-09 DIAGNOSIS — I10 Essential (primary) hypertension: Secondary | ICD-10-CM

## 2015-07-09 DIAGNOSIS — IMO0002 Reserved for concepts with insufficient information to code with codable children: Secondary | ICD-10-CM

## 2015-07-09 MED ORDER — INSULIN DETEMIR 100 UNIT/ML FLEXPEN
21.0000 [IU] | PEN_INJECTOR | Freq: Every day | SUBCUTANEOUS | Status: DC
Start: 2015-07-09 — End: 2015-12-24

## 2015-07-09 MED ORDER — METFORMIN HCL 1000 MG PO TABS: 1000.0000 mg | ORAL_TABLET | Freq: Two times a day (BID) | ORAL | Status: DC

## 2015-07-09 MED ORDER — BISOPROLOL-HYDROCHLOROTHIAZIDE 5-6.25 MG PO TABS: ORAL_TABLET | ORAL | Status: DC

## 2015-07-09 MED ORDER — INSULIN DETEMIR 100 UNIT/ML FLEXPEN: 21.0000 [IU] | PEN_INJECTOR | Freq: Every day | SUBCUTANEOUS | Status: DC

## 2015-07-09 NOTE — Progress Notes (Signed)
Name: [name]   MRN: [id number]    DOB: March 16, 1946   Date:07/09/2015       Progress Note  Subjective  Chief Complaint  Chief Complaint  Patient presents with  . Diabetes    follow up on increasing Levemir to 21 units    Diabetes She presents for her follow-up diabetic visit. She has type 2 diabetes mellitus. Her disease course has been fluctuating. Pertinent negatives for hypoglycemia include no confusion, dizziness, headaches, hunger, mood changes, nervousness/anxiousness, pallor, seizures, sleepiness, speech difficulty, sweats or tremors. Pertinent negatives for diabetes include no blurred vision, no chest pain, no fatigue, no foot paresthesias, no foot ulcerations, no polydipsia, no polyphagia, no polyuria, no visual change, no weakness and no weight loss. There are no hypoglycemic complications. Symptoms are worsening. Pertinent negatives for diabetic complications include no CVA, PVD or retinopathy. Current diabetic treatment includes diet.  Hypertension This is a chronic problem. The current episode started more than 1 year ago. The problem has been waxing and waning since onset. The problem is uncontrolled. Pertinent negatives include no anxiety, blurred vision, chest pain, headaches, malaise/fatigue, neck pain, orthopnea, palpitations, peripheral edema, PND, shortness of breath or sweats. There are no associated agents to hypertension. There are no known risk factors for coronary artery disease. Past treatments include diuretics and ACE inhibitors. The current treatment provides mild improvement. There are no compliance problems.  There is no history of angina, kidney disease, CAD/MI, CVA, heart failure, left ventricular hypertrophy, PVD, renovascular disease or retinopathy. There is no history of chronic renal disease.    No problem-specific assessment & plan notes found for this encounter.   Past Medical History  Diagnosis Date  . Diabetes mellitus without complication   .  Hypertension   . Seizures     Past Surgical History  Procedure Laterality Date  . Ileostomy    . Colostomy reversal      Family History  Problem Relation Age of Onset  . Cancer Mother     Social History   Social History  . Marital Status: Married    Spouse Name: N/A  . Number of Children: N/A  . Years of Education: N/A   Occupational History  . Not on file.   Social History Main Topics  . Smoking status: Never Smoker   . Smokeless tobacco: Not on file  . Alcohol Use: No  . Drug Use: No  . Sexual Activity: No   Other Topics Concern  . Not on file   Social History Narrative    Allergies  Allergen Reactions  . Pioglitazone Nausea Only     Review of Systems  Constitutional: Negative for fever, chills, weight loss, malaise/fatigue and fatigue.  HENT: Negative for ear discharge, ear pain and sore throat.   Eyes: Negative for blurred vision.  Respiratory: Negative for cough, sputum production, shortness of breath and wheezing.   Cardiovascular: Negative for chest pain, palpitations, orthopnea, leg swelling and PND.  Gastrointestinal: Negative for heartburn, nausea, abdominal pain, diarrhea, constipation, blood in stool and melena.  Genitourinary: Negative for dysuria, urgency, frequency and hematuria.  Musculoskeletal: Negative for myalgias, back pain, joint pain and neck pain.  Skin: Negative for pallor and rash.  Neurological: Negative for dizziness, tingling, tremors, sensory change, focal weakness, seizures, speech difficulty, weakness and headaches.  Endo/Heme/Allergies: Negative for environmental allergies, polydipsia and polyphagia. Does not bruise/bleed easily.  Psychiatric/Behavioral: Negative for depression, suicidal ideas and confusion. The patient is not nervous/anxious and does not have insomnia.  Objective  Filed Vitals:   07/09/15 1421  BP: 130/64  Pulse: 64  Height: 5\' 2"  (1.575 m)  Weight: 162 lb (73.483 kg)    Physical Exam   Constitutional: She is well-developed, well-nourished, and in no distress. No distress.  HENT:  Head: Normocephalic and atraumatic.  Right Ear: External ear normal.  Left Ear: External ear normal.  Nose: Nose normal.  Mouth/Throat: Oropharynx is clear and moist.  Eyes: Conjunctivae and EOM are normal. Pupils are equal, round, and reactive to light. Right eye exhibits no discharge. Left eye exhibits no discharge.  Neck: Normal range of motion. Neck supple. No JVD present. No thyromegaly present.  Cardiovascular: Normal rate, regular rhythm, normal heart sounds and intact distal pulses.  Exam reveals no gallop and no friction rub.   No murmur heard. Pulmonary/Chest: Effort normal and breath sounds normal.  Abdominal: Soft. Bowel sounds are normal. She exhibits no mass. There is no tenderness. There is no guarding.  Musculoskeletal: Normal range of motion. She exhibits no edema.  Lymphadenopathy:    She has no cervical adenopathy.  Neurological: She is alert. She has normal reflexes.  Skin: Skin is warm and dry. She is not diaphoretic.  Psychiatric: Mood and affect normal.      Assessment & Plan  Problem List Items Addressed This Visit    None        Dr. Otilio Miu [facility]  07/09/2015

## 2015-07-26 DIAGNOSIS — Z85048 Personal history of other malignant neoplasm of rectum, rectosigmoid junction, and anus: Secondary | ICD-10-CM | POA: Diagnosis not present

## 2015-07-26 DIAGNOSIS — R918 Other nonspecific abnormal finding of lung field: Secondary | ICD-10-CM | POA: Diagnosis not present

## 2015-07-26 DIAGNOSIS — C20 Malignant neoplasm of rectum: Secondary | ICD-10-CM | POA: Diagnosis not present

## 2015-08-20 ENCOUNTER — Ambulatory Visit (INDEPENDENT_AMBULATORY_CARE_PROVIDER_SITE_OTHER): Payer: Medicare Other | Admitting: Internal Medicine

## 2015-08-20 ENCOUNTER — Encounter: Payer: Self-pay | Admitting: Internal Medicine

## 2015-08-20 ENCOUNTER — Other Ambulatory Visit: Payer: Self-pay | Admitting: Internal Medicine

## 2015-08-20 VITALS — BP 122/60 | HR 60 | Ht 62.0 in | Wt 158.4 lb

## 2015-08-20 DIAGNOSIS — R233 Spontaneous ecchymoses: Secondary | ICD-10-CM

## 2015-08-20 DIAGNOSIS — Z23 Encounter for immunization: Secondary | ICD-10-CM | POA: Diagnosis not present

## 2015-08-20 DIAGNOSIS — Z794 Long term (current) use of insulin: Secondary | ICD-10-CM | POA: Diagnosis not present

## 2015-08-20 DIAGNOSIS — E1165 Type 2 diabetes mellitus with hyperglycemia: Secondary | ICD-10-CM

## 2015-08-20 DIAGNOSIS — IMO0001 Reserved for inherently not codable concepts without codable children: Secondary | ICD-10-CM

## 2015-08-20 MED ORDER — GLUCOSE BLOOD VI STRP: ORAL_STRIP | Status: DC

## 2015-08-20 NOTE — Progress Notes (Signed)
Date:  08/20/2015   Name:  [name]   DOB:  [birth year]   MRN:  [id number]   Chief Complaint: Diabetes Diabetes She presents for her follow-up diabetic visit. She has type 2 diabetes mellitus. Her disease course has been worsening. Pertinent negatives for diabetes include no chest pain, no fatigue, no polydipsia and no polyuria. Current diabetic treatment includes insulin injections. She is compliant with treatment most of the time. Her breakfast blood glucose is taken between 8-9 am. Her breakfast blood glucose range is generally 110-130 mg/dl.   Patient was seen several months ago to follow-up on diabetes. A1c was 9.7 so metformin 1000 twice a day was added to her regimen.  Insulin dose was increased to 21 units. However since increasing the metformin she's had stomach upset and diarrhea so she stopped the medication one week ago. Her blood sugars and been running 90 to 130 in the morning and up to 160 in the evening. Off of the metformin her intestinal symptoms have resolved.   Review of Systems:  Review of Systems  Constitutional: Negative for fever, chills and fatigue.  HENT: Negative for hearing loss.   Eyes: Negative for visual disturbance.  Respiratory: Negative for shortness of breath.   Cardiovascular: Negative for chest pain and palpitations.  Gastrointestinal: Negative for abdominal pain, blood in stool and abdominal distention.  Endocrine: Negative for polydipsia and polyuria.  Genitourinary: Negative for frequency and difficulty urinating.  Skin: Positive for wound (bruises from minor trauma). Negative for color change.    Patient Active Problem List   Diagnosis Date Noted  . Diabetes mellitus type 2, uncontrolled ([place]) 05/08/2015  . Essential (primary) hypertension 05/08/2015  . Cancer of colon ([place]) 05/08/2015  . Combined fat and carbohydrate induced hyperlipemia 05/08/2015  . Arthritis of knee, degenerative 05/08/2015  . Cerebral seizure ([place]) 05/08/2015  .  Shoulder strain 05/08/2015  . Avitaminosis D 05/08/2015  . Acute renal failure ([place]) 07/27/2013    Prior to Admission medications   Medication Sig Start Date End Date Taking? Authorizing Provider  aspirin 81 MG tablet Take 81 mg by mouth daily.   Yes Historical Provider, MD  atorvastatin (LIPITOR) 40 MG tablet Take 1 tablet by mouth daily. 02/21/15  Yes Historical Provider, MD  bisoprolol-hydrochlorothiazide (ZIAC) 5-6.25 MG per tablet TAKE 1 BY MOUTH DAILY 07/09/15  Yes Juline Patch, MD  carbamazepine (TEGRETOL) 200 MG tablet Take 1 tablet by mouth daily. 02/21/15  Yes Historical Provider, MD  Cholecalciferol (D3 HIGH POTENCY) 1000 UNITS capsule Take by mouth. 09/22/14  Yes Historical Provider, MD  Coral Calcium 500 MG TABS Take by mouth.   Yes Historical Provider, MD  Insulin Detemir (LEVEMIR FLEXTOUCH) 100 UNIT/ML Pen Inject 21 Units into the skin daily. 07/09/15  Yes Juline Patch, MD  meloxicam (MOBIC) 15 MG tablet Take 1 tablet by mouth daily. 07/16/15  Yes Historical Provider, MD  ramipril (ALTACE) 5 MG capsule Take 1 capsule by mouth daily. 11/14/14  Yes Historical Provider, MD  metFORMIN (GLUCOPHAGE) 1000 MG tablet Take 1 tablet (1,000 mg total) by mouth 2 (two) times daily with a meal. Patient not taking: Reported on 08/20/2015 07/09/15   Juline Patch, MD    Allergies  Allergen Reactions  . Pioglitazone Nausea Only    Past Surgical History  Procedure Laterality Date  . Ileostomy    . Colostomy reversal      Social History  Substance Use Topics  . Smoking status: Never Smoker   . Smokeless  tobacco: None  . Alcohol Use: No     Medication list has been reviewed and updated.  Physical Examination:  Physical Exam  Constitutional: She is oriented to person, place, and time. She appears well-developed and well-nourished.  Neck: Normal range of motion. No thyromegaly present.  Cardiovascular: Normal rate, regular rhythm and normal heart sounds.   Pulmonary/Chest: Effort  normal and breath sounds normal. No respiratory distress. She has no wheezes.  Musculoskeletal: She exhibits no edema.  Neurological: She is alert and oriented to person, place, and time.  Skin: Skin is warm. There is erythema.  Psychiatric: She has a normal mood and affect. Her behavior is normal.    BP 122/60 mmHg  Pulse 60  Ht 5\' 2"  (1.575 m)  Wt 158 lb 6.4 oz (71.85 kg)  BMI 28.96 kg/m2  Assessment and Plan: 1. Uncontrolled type 2 diabetes mellitus without complication, with long-term current use of insulin (HCC) Continue Levemir 21 units daily Try metformin 500 mg with evening meal Call in 2 weeks to report blood sugars or sooner if metformin is not tolerated - glucose blood (ONE TOUCH ULTRA TEST) test strip; Test blood sugar twice a day Dx E11.65  Dispense: 100 each; Refill: 12  2. Need for influenza vaccination - Flu Vaccine QUAD 36+ mos IM.  3. Senile ecchymosis Skin precautions advised  Halina Maidens, MD [facility]  08/20/2015

## 2015-08-20 NOTE — Patient Instructions (Signed)
Begin taking Metformin 500 mg with the largest meal of the day. Continue levemir 21 units daily

## 2015-08-21 ENCOUNTER — Other Ambulatory Visit: Payer: Self-pay | Admitting: Family Medicine

## 2015-08-22 ENCOUNTER — Other Ambulatory Visit: Payer: Self-pay | Admitting: Internal Medicine

## 2015-08-22 DIAGNOSIS — Z794 Long term (current) use of insulin: Principal | ICD-10-CM

## 2015-08-22 DIAGNOSIS — IMO0001 Reserved for inherently not codable concepts without codable children: Secondary | ICD-10-CM

## 2015-08-22 DIAGNOSIS — E1165 Type 2 diabetes mellitus with hyperglycemia: Principal | ICD-10-CM

## 2015-08-22 MED ORDER — GLUCOSE BLOOD VI STRP: ORAL_STRIP | Status: DC

## 2015-08-27 ENCOUNTER — Other Ambulatory Visit: Payer: Self-pay | Admitting: Internal Medicine

## 2015-08-27 DIAGNOSIS — E1165 Type 2 diabetes mellitus with hyperglycemia: Principal | ICD-10-CM

## 2015-08-27 DIAGNOSIS — Z794 Long term (current) use of insulin: Principal | ICD-10-CM

## 2015-08-27 DIAGNOSIS — IMO0001 Reserved for inherently not codable concepts without codable children: Secondary | ICD-10-CM

## 2015-08-27 MED ORDER — GLUCOSE BLOOD VI STRP: ORAL_STRIP | Status: DC

## 2015-10-08 DIAGNOSIS — M654 Radial styloid tenosynovitis [de Quervain]: Secondary | ICD-10-CM | POA: Diagnosis not present

## 2015-10-08 DIAGNOSIS — M1712 Unilateral primary osteoarthritis, left knee: Secondary | ICD-10-CM | POA: Diagnosis not present

## 2015-10-22 ENCOUNTER — Encounter: Payer: Self-pay | Admitting: Internal Medicine

## 2015-10-22 ENCOUNTER — Ambulatory Visit (INDEPENDENT_AMBULATORY_CARE_PROVIDER_SITE_OTHER): Payer: Medicare Other | Admitting: Internal Medicine

## 2015-10-22 VITALS — BP 110/60 | HR 64 | Ht 62.0 in | Wt 157.0 lb

## 2015-10-22 DIAGNOSIS — R233 Spontaneous ecchymoses: Secondary | ICD-10-CM

## 2015-10-22 DIAGNOSIS — I6789 Other cerebrovascular disease: Secondary | ICD-10-CM | POA: Diagnosis not present

## 2015-10-22 DIAGNOSIS — E1169 Type 2 diabetes mellitus with other specified complication: Secondary | ICD-10-CM | POA: Diagnosis not present

## 2015-10-22 DIAGNOSIS — E782 Mixed hyperlipidemia: Secondary | ICD-10-CM

## 2015-10-22 DIAGNOSIS — Z23 Encounter for immunization: Secondary | ICD-10-CM

## 2015-10-22 DIAGNOSIS — Z794 Long term (current) use of insulin: Secondary | ICD-10-CM | POA: Diagnosis not present

## 2015-10-22 DIAGNOSIS — E1165 Type 2 diabetes mellitus with hyperglycemia: Secondary | ICD-10-CM

## 2015-10-22 DIAGNOSIS — G40909 Epilepsy, unspecified, not intractable, without status epilepticus: Secondary | ICD-10-CM

## 2015-10-22 DIAGNOSIS — I10 Essential (primary) hypertension: Secondary | ICD-10-CM | POA: Diagnosis not present

## 2015-10-22 DIAGNOSIS — IMO0001 Reserved for inherently not codable concepts without codable children: Secondary | ICD-10-CM

## 2015-10-22 MED ORDER — GLUCOSE BLOOD VI STRP: ORAL_STRIP | Status: DC

## 2015-10-22 NOTE — Progress Notes (Signed)
Date:  10/22/2015   Name:  [name]   DOB:  13-Feb-1946   MRN:  [id number]   Chief Complaint: Diabetes Diabetes She presents for her follow-up diabetic visit. She has type 2 diabetes mellitus. Her disease course has been improving. Pertinent negatives for hypoglycemia include no nervousness/anxiousness, seizures or tremors. Pertinent negatives for diabetes include no chest pain and no fatigue. Symptoms are improving. Current diabetic treatment includes insulin injections and oral agent (monotherapy). She is compliant with treatment most of the time. Her home blood glucose trend is decreasing steadily.   Her home blood sugars range between 110 and 160 fasting. She was able to restart the metformin and titrate back to 1000 mg twice a day without side effects.   Review of Systems  Constitutional: Negative for fever, chills, fatigue and unexpected weight change.  Respiratory: Negative for chest tightness and shortness of breath.   Cardiovascular: Negative for chest pain and palpitations.  Gastrointestinal: Negative for abdominal pain, diarrhea, constipation and blood in stool.  Musculoskeletal: Positive for arthralgias (in knee and wrist - had cortisone injection last month).  Neurological: Negative for tremors and seizures.  Hematological: Negative for adenopathy. Bruises/bleeds easily.  Psychiatric/Behavioral: The patient is not nervous/anxious.     Patient Active Problem List   Diagnosis Date Noted  . 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 Diabetes mellitus type 2, uncontrolled ([place]) 05/08/2015  . Essential (primary) hypertension 05/08/2015  . Cancer of colon ([place]) 05/08/2015  . Combined fat and carbohydrate induced hyperlipemia 05/08/2015  . Arthritis of knee, degenerative 05/08/2015  . Cerebral seizure ([place]) 05/08/2015  . Shoulder strain 05/08/2015  . Avitaminosis D 05/08/2015  . Acute renal failure ([place]) 07/27/2013    Prior to Admission medications   Medication Sig Start Date End  Date Taking? Authorizing Provider  aspirin 81 MG tablet Take 81 mg by mouth daily.   Yes Historical Provider, MD  atorvastatin (LIPITOR) 40 MG tablet Take 1 tablet by mouth daily. 02/21/15  Yes Historical Provider, MD  bisoprolol-hydrochlorothiazide (ZIAC) 5-6.25 MG per tablet TAKE 1 BY MOUTH DAILY 07/09/15  Yes Juline Patch, MD  carbamazepine (TEGRETOL) 200 MG tablet Take 1 tablet by mouth daily. 02/21/15  Yes Historical Provider, MD  Cholecalciferol (D3 HIGH POTENCY) 1000 UNITS capsule Take by mouth. 09/22/14  Yes Historical Provider, MD  Coral Calcium 500 MG TABS Take by mouth.   Yes Historical Provider, MD  glucose blood (ONE TOUCH ULTRA TEST) test strip Test blood sugar daily 08/27/15  Yes Glean Hess, MD  Insulin Detemir (LEVEMIR FLEXTOUCH) 100 UNIT/ML Pen Inject 21 Units into the skin daily. 07/09/15  Yes Juline Patch, MD  meloxicam (MOBIC) 15 MG tablet Take 1 tablet by mouth daily. 07/16/15  Yes Historical Provider, MD  metFORMIN (GLUCOPHAGE) 1000 MG tablet Take 1 tablet (1,000 mg total) by mouth 2 (two) times daily with a meal. 07/09/15  Yes Juline Patch, MD  ramipril (ALTACE) 5 MG capsule Take 1 capsule by mouth daily. 11/14/14  Yes Historical Provider, MD    Allergies  Allergen Reactions  . Pioglitazone Nausea Only    Past Surgical History  Procedure Laterality Date  . Ileostomy    . Colostomy reversal      Social History  Substance Use Topics  . Smoking status: Never Smoker   . Smokeless tobacco: None  . Alcohol Use: No    Medication list has been reviewed and updated.   Physical Exam  Constitutional: She is oriented to person, place, and  time. She appears well-developed. No distress.  HENT:  Head: Normocephalic and atraumatic.  Eyes: Right eye exhibits no discharge. Left eye exhibits no discharge. No scleral icterus.  Neck: Normal range of motion.  Cardiovascular: Normal rate, regular rhythm and normal heart sounds.   Pulmonary/Chest: Effort normal and breath  sounds normal. No respiratory distress.  Abdominal: Soft. Bowel sounds are normal. She exhibits no distension and no mass. There is no tenderness. There is no guarding.  Musculoskeletal: Normal range of motion. She exhibits no edema.  Lymphadenopathy:    She has no cervical adenopathy.  Neurological: She is alert and oriented to person, place, and time.  Skin: Skin is warm and dry. No rash noted.  Psychiatric: She has a normal mood and affect. Her behavior is normal. Thought content normal.  Nursing note and vitals reviewed.   BP 110/60 mmHg  Pulse 64  Ht 5\' 2"  (1.575 m)  Wt 157 lb (71.215 kg)  BMI 28.71 kg/m2  Assessment and Plan: 1. Uncontrolled type 2 diabetes mellitus without complication, with long-term current use of insulin (HCC) Improved control with resumption of metformin - glucose blood (ONE TOUCH ULTRA TEST) test strip; Test blood sugar twice a day  Dispense: 200 each; Refill: 12 - Hemoglobin A1c  2. Essential (primary) hypertension Controlled  3. Cerebral seizure ([place]) On preventative with no recurrence of seizure  4. Senile ecchymosis Stable  5. DM type 2 with diabetic mixed hyperlipidemia ([place]) On appropriate statin therapy  6. Need for pneumococcal vaccination - Pneumococcal conjugate vaccine 13-valent IM   Halina Maidens, MD [facility]  10/22/2015

## 2015-10-22 NOTE — Patient Instructions (Signed)
Pneumococcal Conjugate Vaccine (PCV13)   1. Why get vaccinated?  Vaccination can protect both children and adults from pneumococcal disease.  Pneumococcal disease is caused by bacteria that can spread from person to person through close contact. It can cause ear infections, and it can also lead to more serious infections of the:  · Lungs (pneumonia),  · Blood (bacteremia), and  · Covering of the brain and spinal cord (meningitis).  Pneumococcal pneumonia is most common among adults. Pneumococcal meningitis can cause deafness and brain damage, and it kills about 1 child in 10 who get it.  Anyone can get pneumococcal disease, but children under 2 years of age and adults 65 years and older, people with certain medical conditions, and cigarette smokers are at the highest risk.  Before there was a vaccine, the [place] saw:  · more than 700 cases of meningitis,  · about 13,000 blood infections,  · about 5 million ear infections, and  · about 200 deaths  in children under 5 each year from pneumococcal disease. Since vaccine became available, severe pneumococcal disease in these children has fallen by 88%.  About 18,000 older adults die of pneumococcal disease each year in the [place].  Treatment of pneumococcal infections with penicillin and other drugs is not as effective as it used to be, because some strains of the disease have become resistant to these drugs. This makes prevention of the disease, through vaccination, even more important.  2. PCV13 vaccine  Pneumococcal conjugate vaccine (called PCV13) protects against 13 types of pneumococcal bacteria.  PCV13 is routinely given to children at 2, 4, 6, and 12-15 months of age. It is also recommended for children and adults 2 to 64 years of age with certain health conditions, and for all adults 65 years of age and older. Your doctor can give you details.  3. Some people should not get this vaccine  Anyone who has ever had a life-threatening allergic reaction  to a dose of this vaccine, to an earlier pneumococcal vaccine called PCV7, or to any vaccine containing diphtheria toxoid (for example, DTaP), should not get PCV13.  Anyone with a severe allergy to any component of PCV13 should not get the vaccine. Tell your doctor if the person being vaccinated has any severe allergies.  If the person scheduled for vaccination is not feeling well, your healthcare provider might decide to reschedule the shot on another day.  4. Risks of a vaccine reaction  With any medicine, including vaccines, there is a chance of reactions. These are usually mild and go away on their own, but serious reactions are also possible.  Problems reported following PCV13 varied by age and dose in the series. The most common problems reported among children were:  · About half became drowsy after the shot, had a temporary loss of appetite, or had redness or tenderness where the shot was given.  · About 1 out of 3 had swelling where the shot was given.  · About 1 out of 3 had a mild fever, and about 1 in 20 had a fever over 102.2°F.  · Up to about 8 out of 10 became fussy or irritable.  Adults have reported pain, redness, and swelling where the shot was given; also mild fever, fatigue, headache, chills, or muscle pain.  Young children who get PCV13 along with inactivated flu vaccine at the same time may be at increased risk for seizures caused by fever. Ask your doctor for more information.  Problems that   could happen after any vaccine:  · People sometimes faint after a medical procedure, including vaccination. Sitting or lying down for about 15 minutes can help prevent fainting, and injuries caused by a fall. Tell your doctor if you feel dizzy, or have vision changes or ringing in the ears.  · Some older children and adults get severe pain in the shoulder and have difficulty moving the arm where a shot was given. This happens very rarely.  · Any medication can cause a severe allergic reaction. Such  reactions from a vaccine are very rare, estimated at about 1 in a million doses, and would happen within a few minutes to a few hours after the vaccination.  As with any medicine, there is a very small chance of a vaccine causing a serious injury or death.  The safety of vaccines is always being monitored. For more information, visit: www.cdc.gov/vaccinesafety/  5. What if there is a serious reaction?  What should I look for?  · Look for anything that concerns you, such as signs of a severe allergic reaction, very high fever, or unusual behavior.  Signs of a severe allergic reaction can include hives, swelling of the face and throat, difficulty breathing, a fast heartbeat, dizziness, and weakness-usually within a few minutes to a few hours after the vaccination.  What should I do?  · If you think it is a severe allergic reaction or other emergency that can't wait, call 9-1-1 or get the person to the nearest hospital. Otherwise, call your doctor.  Reactions should be reported to the Vaccine Adverse Event Reporting System (VAERS). Your doctor should file this report, or you can do it yourself through the VAERS web site at www.vaers.hhs.gov, or by calling 1-800-822-7967.  VAERS does not give medical advice.  6. The National Vaccine Injury Compensation Program  The National Vaccine Injury Compensation Program (VICP) is a federal program that was created to compensate people who may have been injured by certain vaccines.  Persons who believe they may have been injured by a vaccine can learn about the program and about filing a claim by calling 1-800-338-2382 or visiting the VICP website at www.hrsa.gov/vaccinecompensation. There is a time limit to file a claim for compensation.  7. How can I learn more?  · Ask your healthcare provider. He or she can give you the vaccine package insert or suggest other sources of information.  · Call your local or state health department.  · Contact the Centers for Disease Control and  Prevention (CDC):    Call 1-800-232-4636 (1-800-CDC-INFO) or    Visit CDC's website at www.cdc.gov/vaccines  Vaccine Information Statement  PCV13 Vaccine (09/21/2014)     This information is not intended to replace advice given to you by your health care provider. Make sure you discuss any questions you have with your health care provider.     Document Released: 08/31/2006 Document Revised: 11/24/2014 Document Reviewed: 09/28/2014  Elsevier Interactive Patient Education ©2016 Elsevier Inc.

## 2015-10-23 LAB — HEMOGLOBIN A1C
ESTIMATED AVERAGE GLUCOSE: 217 mg/dL
HEMOGLOBIN A1C: 9.2 % — AB (ref 4.8–5.6)

## 2015-11-02 DIAGNOSIS — C20 Malignant neoplasm of rectum: Secondary | ICD-10-CM | POA: Diagnosis not present

## 2015-11-02 DIAGNOSIS — Z85048 Personal history of other malignant neoplasm of rectum, rectosigmoid junction, and anus: Secondary | ICD-10-CM | POA: Diagnosis not present

## 2015-11-02 DIAGNOSIS — Z08 Encounter for follow-up examination after completed treatment for malignant neoplasm: Secondary | ICD-10-CM | POA: Diagnosis not present

## 2015-11-16 ENCOUNTER — Encounter: Payer: Self-pay | Admitting: Internal Medicine

## 2015-11-16 ENCOUNTER — Ambulatory Visit (INDEPENDENT_AMBULATORY_CARE_PROVIDER_SITE_OTHER): Payer: Medicare Other | Admitting: Internal Medicine

## 2015-11-16 VITALS — BP 128/68 | HR 74 | Temp 98.1°F | Ht 62.0 in | Wt 152.2 lb

## 2015-11-16 DIAGNOSIS — H109 Unspecified conjunctivitis: Secondary | ICD-10-CM | POA: Diagnosis not present

## 2015-11-16 DIAGNOSIS — J0100 Acute maxillary sinusitis, unspecified: Secondary | ICD-10-CM

## 2015-11-16 MED ORDER — AMOXICILLIN-POT CLAVULANATE 875-125 MG PO TABS: 1.0000 | ORAL_TABLET | Freq: Two times a day (BID) | ORAL | Status: DC

## 2015-11-16 MED ORDER — FLUTICASONE PROPIONATE 50 MCG/ACT NA SUSP: 2.0000 | Freq: Every day | NASAL | Status: DC

## 2015-11-16 MED ORDER — NEOMYCIN-POLYMYXIN-DEXAMETH 3.5-10000-0.1 OP SUSP: 2.0000 [drp] | Freq: Four times a day (QID) | OPHTHALMIC | Status: DC

## 2015-11-16 NOTE — Progress Notes (Signed)
Date:  11/16/2015   Name:  [name]   DOB:  01-01-1946   MRN:  [id number]   Chief Complaint: URI URI  The current episode started in the past 7 days. The problem has been unchanged. There has been no fever. Associated symptoms include congestion, coughing, ear pain, nausea, sinus pain, a sore throat and swollen glands. Pertinent negatives include no chest pain, joint pain, rash, vomiting or wheezing. She has tried decongestant and acetaminophen for the symptoms.      Review of Systems  Constitutional: Positive for fever, chills and fatigue.  HENT: Positive for congestion, ear pain, sinus pressure and sore throat.   Eyes: Positive for pain, discharge, redness and itching.  Respiratory: Positive for cough. Negative for wheezing.   Cardiovascular: Negative for chest pain.  Gastrointestinal: Positive for nausea. Negative for vomiting.  Musculoskeletal: Negative for joint pain.  Skin: Negative for rash.  Neurological: Negative for dizziness and syncope.    Patient Active Problem List   Diagnosis Date Noted  . 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 Diabetes mellitus type 2, uncontrolled ([place]) 05/08/2015  . Essential (primary) hypertension 05/08/2015  . History of rectal cancer 05/08/2015  . DM type 2 with diabetic mixed hyperlipidemia ([place]) 05/08/2015  . Arthritis of knee, degenerative 05/08/2015  . Cerebral seizure ([place]) 05/08/2015  . Shoulder strain 05/08/2015  . Avitaminosis D 05/08/2015    Prior to Admission medications   Medication Sig Start Date End Date Taking? Authorizing Provider  aspirin 81 MG tablet Take 81 mg by mouth daily.   Yes Historical Provider, MD  atorvastatin (LIPITOR) 40 MG tablet Take 1 tablet by mouth daily. 02/21/15  Yes Historical Provider, MD  bisoprolol-hydrochlorothiazide (ZIAC) 5-6.25 MG per tablet TAKE 1 BY MOUTH DAILY 07/09/15  Yes Juline Patch, MD  carbamazepine (TEGRETOL) 200 MG tablet Take 1 tablet by mouth daily. 02/21/15  Yes Historical  Provider, MD  Cholecalciferol (D3 HIGH POTENCY) 1000 UNITS capsule Take by mouth. 09/22/14  Yes Historical Provider, MD  Coral Calcium 500 MG TABS Take by mouth.   Yes Historical Provider, MD  glucose blood (ONE TOUCH ULTRA TEST) test strip Test blood sugar twice a day 10/22/15  Yes Glean Hess, MD  Insulin Detemir (LEVEMIR FLEXTOUCH) 100 UNIT/ML Pen Inject 21 Units into the skin daily. 07/09/15  Yes Juline Patch, MD  meloxicam (MOBIC) 15 MG tablet Take 1 tablet by mouth daily. 07/16/15  Yes Historical Provider, MD  metFORMIN (GLUCOPHAGE) 1000 MG tablet Take 1 tablet (1,000 mg total) by mouth 2 (two) times daily with a meal. 07/09/15  Yes Juline Patch, MD  ramipril (ALTACE) 5 MG capsule Take 1 capsule by mouth daily. 11/14/14  Yes Historical Provider, MD    Allergies  Allergen Reactions  . Pioglitazone Nausea Only    Past Surgical History  Procedure Laterality Date  . Ileostomy  05/2013  . Colostomy reversal  01/2014    Social History  Substance Use Topics  . Smoking status: Never Smoker   . Smokeless tobacco: None  . Alcohol Use: No     Medication list has been reviewed and updated.   Physical Exam  Constitutional: She is oriented to person, place, and time. She appears well-developed and well-nourished. She appears ill.  HENT:  Right Ear: External ear and ear canal normal. Tympanic membrane is not erythematous and not retracted.  Left Ear: External ear and ear canal normal. Tympanic membrane is not erythematous and not retracted.  Nose: Right sinus  exhibits maxillary sinus tenderness and frontal sinus tenderness. Left sinus exhibits maxillary sinus tenderness and frontal sinus tenderness.  Mouth/Throat: Uvula is midline and mucous membranes are normal. No oral lesions. Posterior oropharyngeal erythema present. No oropharyngeal exudate.  Eyes: Right eye exhibits chemosis. Left eye exhibits chemosis. Right conjunctiva is injected. Left conjunctiva is injected.    Cardiovascular: Normal rate, regular rhythm and normal heart sounds.   Pulmonary/Chest: She has decreased breath sounds. She has no wheezes. She has no rhonchi. She has no rales.  Lymphadenopathy:    She has no cervical adenopathy.  Neurological: She is alert and oriented to person, place, and time.    BP 128/68 mmHg  Pulse 74  Temp(Src) 98.1 F (36.7 C)  Ht 5\' 2"  (1.575 m)  Wt 152 lb 3.2 oz (69.037 kg)  BMI 27.83 kg/m2  SpO2 98%  Assessment and Plan: 1. Bilateral conjunctivitis - neomycin-polymyxin b-dexamethasone (MAXITROL) 3.5-10000-0.1 SUSP; Place 2 drops into both eyes every 6 (six) hours.  Dispense: 5 mL; Refill: 0  2. Acute maxillary sinusitis, recurrence not specified Continue Mucinex; take tylenol or advil and push fluids - amoxicillin-clavulanate (AUGMENTIN) 875-125 MG tablet; Take 1 tablet by mouth 2 (two) times daily.  Dispense: 20 tablet; Refill: 0 - fluticasone (FLONASE) 50 MCG/ACT nasal spray; Place 2 sprays into both nostrils daily.  Dispense: 16 g; Refill: [place], MD [facility]  11/16/2015

## 2015-11-16 NOTE — Patient Instructions (Signed)
Plain mucinex twice a day for chest congestion  Fluticasone nasal spray as directed  Take all antibiotic pills  Use eye drops for at least 5 days - can stop if symptoms resolved  Take tylenol or Advil 3-4 times a day as needed for headache, sore throat, fever and body aches  Lots of fluids - rest but do not stay in bed.

## 2015-11-20 ENCOUNTER — Other Ambulatory Visit: Payer: Self-pay | Admitting: Internal Medicine

## 2015-12-24 ENCOUNTER — Other Ambulatory Visit: Payer: Self-pay

## 2015-12-24 DIAGNOSIS — Z794 Long term (current) use of insulin: Principal | ICD-10-CM

## 2015-12-24 DIAGNOSIS — IMO0001 Reserved for inherently not codable concepts without codable children: Secondary | ICD-10-CM

## 2015-12-24 DIAGNOSIS — E1165 Type 2 diabetes mellitus with hyperglycemia: Principal | ICD-10-CM

## 2015-12-24 MED ORDER — INSULIN DETEMIR 100 UNIT/ML FLEXPEN: 21.0000 [IU] | PEN_INJECTOR | Freq: Every day | SUBCUTANEOUS | Status: DC

## 2015-12-24 NOTE — Telephone Encounter (Signed)
Patient called in today and states that she switched to [place] and would like this sent in.

## 2016-01-24 DIAGNOSIS — C20 Malignant neoplasm of rectum: Secondary | ICD-10-CM | POA: Diagnosis not present

## 2016-01-24 DIAGNOSIS — C189 Malignant neoplasm of colon, unspecified: Secondary | ICD-10-CM | POA: Diagnosis not present

## 2016-02-12 DIAGNOSIS — H25013 Cortical age-related cataract, bilateral: Secondary | ICD-10-CM | POA: Diagnosis not present

## 2016-02-22 ENCOUNTER — Encounter: Payer: Self-pay | Admitting: Internal Medicine

## 2016-02-22 ENCOUNTER — Ambulatory Visit (INDEPENDENT_AMBULATORY_CARE_PROVIDER_SITE_OTHER): Payer: Medicare Other | Admitting: Internal Medicine

## 2016-02-22 VITALS — BP 138/84 | HR 64 | Ht 62.0 in | Wt 156.0 lb

## 2016-02-22 DIAGNOSIS — I10 Essential (primary) hypertension: Secondary | ICD-10-CM

## 2016-02-22 DIAGNOSIS — E1165 Type 2 diabetes mellitus with hyperglycemia: Secondary | ICD-10-CM | POA: Diagnosis not present

## 2016-02-22 DIAGNOSIS — I6789 Other cerebrovascular disease: Secondary | ICD-10-CM

## 2016-02-22 DIAGNOSIS — Z794 Long term (current) use of insulin: Secondary | ICD-10-CM | POA: Diagnosis not present

## 2016-02-22 DIAGNOSIS — E1169 Type 2 diabetes mellitus with other specified complication: Secondary | ICD-10-CM | POA: Diagnosis not present

## 2016-02-22 DIAGNOSIS — IMO0001 Reserved for inherently not codable concepts without codable children: Secondary | ICD-10-CM

## 2016-02-22 DIAGNOSIS — Z85048 Personal history of other malignant neoplasm of rectum, rectosigmoid junction, and anus: Secondary | ICD-10-CM | POA: Diagnosis not present

## 2016-02-22 DIAGNOSIS — E785 Hyperlipidemia, unspecified: Secondary | ICD-10-CM | POA: Diagnosis not present

## 2016-02-22 DIAGNOSIS — Z0000 Encounter for general adult medical examination without abnormal findings: Secondary | ICD-10-CM | POA: Diagnosis not present

## 2016-02-22 DIAGNOSIS — G40909 Epilepsy, unspecified, not intractable, without status epilepticus: Secondary | ICD-10-CM

## 2016-02-22 MED ORDER — CARBAMAZEPINE 200 MG PO TABS: 200.0000 mg | ORAL_TABLET | Freq: Every day | ORAL | Status: DC

## 2016-02-22 MED ORDER — METFORMIN HCL 1000 MG PO TABS: 1000.0000 mg | ORAL_TABLET | Freq: Two times a day (BID) | ORAL | Status: DC

## 2016-02-22 MED ORDER — BISOPROLOL-HYDROCHLOROTHIAZIDE 5-6.25 MG PO TABS
ORAL_TABLET | ORAL | Status: DC
Start: 2016-02-22 — End: 2017-02-12

## 2016-02-22 NOTE — Patient Instructions (Signed)
Health Maintenance  Topic Date Due  . Hepatitis C Screening  09-03-1946  . FOOT EXAM  10/16/1955  . COLONOSCOPY  10/16/1995  . ZOSTAVAX  10/15/2005  . OPHTHALMOLOGY EXAM  11/17/2015  . HEMOGLOBIN A1C  04/21/2016  . INFLUENZA VACCINE  06/17/2016  . PNA vac Low Risk Adult (2 of 2 - PPSV23) 10/21/2016  . MAMMOGRAM  05/03/2017  . TETANUS/TDAP  12/19/2024  . DEXA SCAN  Addressed   Breast Self-Awareness Practicing breast self-awareness may pick up problems early, prevent significant medical complications, and possibly save your life. By practicing breast self-awareness, you can become familiar with how your breasts look and feel and if your breasts are changing. This allows you to notice changes early. It can also offer you some reassurance that your breast health is good. One way to learn what is normal for your breasts and whether your breasts are changing is to do a breast self-exam. If you find a lump or something that was not present in the past, it is best to contact your caregiver right away. Other findings that should be evaluated by your caregiver include nipple discharge, especially if it is bloody; skin changes or reddening; areas where the skin seems to be pulled in (retracted); or new lumps and bumps. Breast pain is seldom associated with cancer (malignancy), but should also be evaluated by a caregiver. HOW TO PERFORM A BREAST SELF-EXAM The best time to examine your breasts is 5-7 days after your menstrual period is over. During menstruation, the breasts are lumpier, and it may be more difficult to pick up changes. If you do not menstruate, have reached menopause, or had your uterus removed (hysterectomy), you should examine your breasts at regular intervals, such as monthly. If you are breastfeeding, examine your breasts after a feeding or after using a breast pump. Breast implants do not decrease the risk for lumps or tumors, so continue to perform breast self-exams as recommended. Talk to  your caregiver about how to determine the difference between the implant and breast tissue. Also, talk about the amount of pressure you should use during the exam. Over time, you will become more familiar with the variations of your breasts and more comfortable with the exam. A breast self-exam requires you to remove all your clothes above the waist. 1. Look at your breasts and nipples. Stand in front of a mirror in a room with good lighting. With your hands on your hips, push your hands firmly downward. Look for a difference in shape, contour, and size from one breast to the other (asymmetry). Asymmetry includes puckers, dips, or bumps. Also, look for skin changes, such as reddened or scaly areas on the breasts. Look for nipple changes, such as discharge, dimpling, repositioning, or redness. 2. Carefully feel your breasts. This is best done either in the shower or tub while using soapy water or when flat on your back. Place the arm (on the side of the breast you are examining) above your head. Use the pads (not the fingertips) of your three middle fingers on your opposite hand to feel your breasts. Start in the underarm area and use  inch (2 cm) overlapping circles to feel your breast. Use 3 different levels of pressure (light, medium, and firm pressure) at each circle before moving to the next circle. The light pressure is needed to feel the tissue closest to the skin. The medium pressure will help to feel breast tissue a little deeper, while the firm pressure is needed to  feel the tissue close to the ribs. Continue the overlapping circles, moving downward over the breast until you feel your ribs below your breast. Then, move one finger-width towards the center of the body. Continue to use the  inch (2 cm) overlapping circles to feel your breast as you move slowly up toward the collar bone (clavicle) near the base of the neck. Continue the up and down exam using all 3 pressures until you reach the middle of  the chest. Do this with each breast, carefully feeling for lumps or changes. 3.  Keep a written record with breast changes or normal findings for each breast. By writing this information down, you do not need to depend only on memory for size, tenderness, or location. Write down where you are in your menstrual cycle, if you are still menstruating. Breast tissue can have some lumps or thick tissue. However, see your caregiver if you find anything that concerns you.  SEEK MEDICAL CARE IF:  You see a change in shape, contour, or size of your breasts or nipples.   You see skin changes, such as reddened or scaly areas on the breasts or nipples.   You have an unusual discharge from your nipples.   You feel a new lump or unusually thick areas.    This information is not intended to replace advice given to you by your health care provider. Make sure you discuss any questions you have with your health care provider.   Document Released: 11/03/2005 Document Revised: 10/20/2012 Document Reviewed: 02/18/2012 Elsevier Interactive Patient Education [employer].

## 2016-02-22 NOTE — Progress Notes (Signed)
Patient: [name], Female    DOB: 05/07/1946, 70 y.o.   MRN: [id number] Visit Date: 02/22/2016  Today's Provider: Halina Maidens, MD   Chief Complaint  Patient presents with  . Medicare Wellness  . Diabetes  . Hypertension  . Hyperlipidemia   Subjective:    Annual wellness visit [name] is a 70 y.o. female who presents today for her Subsequent Annual Wellness Visit. She feels well. She reports exercising little due to knee arthritis. She reports she is sleeping well.   ----------------------------------------------------------- Diabetes She presents for her follow-up diabetic visit. She has type 2 diabetes mellitus. Her disease course has been improving. There are no hypoglycemic associated symptoms. Pertinent negatives for hypoglycemia include no dizziness, headaches, seizures or tremors. Pertinent negatives for diabetes include no chest pain, no fatigue, no polydipsia, no polyuria and no weakness. Current diabetic treatment includes insulin injections.  Hypertension This is a chronic problem. The current episode started more than 1 year ago. The problem is unchanged. The problem is controlled. Pertinent negatives include no chest pain, headaches, palpitations or shortness of breath.  Hyperlipidemia This is a chronic problem. The problem is resistant. Recent lipid tests were reviewed and are normal. Pertinent negatives include no chest pain or shortness of breath. Current antihyperlipidemic treatment includes statins.   Seizure disorder - no seizures in more than 20 years.  No side effects to medication and no difficulty taking it.  She is satisfied to continue it indefinitely.   Lab Results  Component Value Date   HGBA1C 9.2* 10/22/2015     Review of Systems  Constitutional: Negative for fever, chills and fatigue.  HENT: Negative for hearing loss, tinnitus, trouble swallowing and voice change.   Eyes: Negative for visual disturbance.  Respiratory: Negative  for cough, chest tightness, shortness of breath and wheezing.   Cardiovascular: Negative for chest pain, palpitations and leg swelling.  Gastrointestinal: Negative for abdominal pain, diarrhea and constipation.  Endocrine: Negative for polydipsia and polyuria.  Genitourinary: Negative for dysuria, hematuria, vaginal bleeding and vaginal discharge.  Musculoskeletal: Positive for joint swelling, arthralgias and gait problem.  Skin: Positive for wound (skin tear on right forearm). Negative for rash.  Allergic/Immunologic: Negative for environmental allergies.  Neurological: Negative for dizziness, tremors, seizures, weakness and headaches.  Hematological: Negative for adenopathy.  Psychiatric/Behavioral: Negative for sleep disturbance and dysphoric mood.    Social History   Social History  . Marital Status: Married    Spouse Name: N/A  . Number of Children: N/A  . Years of Education: N/A   Occupational History  . Not on file.   Social History Main Topics  . Smoking status: Never Smoker   . Smokeless tobacco: Not on file  . Alcohol Use: No  . Drug Use: No  . Sexual Activity: No   Other Topics Concern  . Not on file   Social History Narrative    Patient Active Problem List   Diagnosis Date Noted  . 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 Diabetes mellitus type 2, uncontrolled ([place]) 05/08/2015  . Essential (primary) hypertension 05/08/2015  . History of rectal cancer 05/08/2015  . Hyperlipidemia associated with type 2 diabetes mellitus ([place]) 05/08/2015  . Arthritis of knee, degenerative 05/08/2015  . Cerebral seizure ([place]) 05/08/2015  . Shoulder strain 05/08/2015  . Avitaminosis D 05/08/2015    Past Surgical History  Procedure Laterality Date  . Ileostomy  05/2013  . Colostomy reversal  01/2014    Her family history includes Cancer in her  mother.    Previous Medications   ASPIRIN 81 MG TABLET    Take 81 mg by mouth daily.   ATORVASTATIN (LIPITOR) 40 MG TABLET    Take 1  tablet by mouth daily.   BISOPROLOL-HYDROCHLOROTHIAZIDE (ZIAC) 5-6.25 MG PER TABLET    TAKE 1 BY MOUTH DAILY   CARBAMAZEPINE (TEGRETOL) 200 MG TABLET    Take 1 tablet by mouth daily.   CHOLECALCIFEROL (D3 HIGH POTENCY) 1000 UNITS CAPSULE    Take by mouth.   CORAL CALCIUM 500 MG TABS    Take by mouth.   FLUTICASONE (FLONASE) 50 MCG/ACT NASAL SPRAY    Place 2 sprays into both nostrils daily.   GLUCOSE BLOOD (ONE TOUCH ULTRA TEST) TEST STRIP    Test blood sugar twice a day   INSULIN DETEMIR (LEVEMIR FLEXTOUCH) 100 UNIT/ML PEN    Inject 21 Units into the skin daily.   MELOXICAM (MOBIC) 15 MG TABLET    Take 1 tablet by mouth daily.   METFORMIN (GLUCOPHAGE) 1000 MG TABLET    Take 1 tablet (1,000 mg total) by mouth 2 (two) times daily with a meal.   NEOMYCIN-POLYMYXIN B-DEXAMETHASONE (MAXITROL) 3.5-10000-0.1 SUSP    Place 2 drops into both eyes every 6 (six) hours.   RAMIPRIL (ALTACE) 5 MG CAPSULE    TAKE 1 BY MOUTH DAILY    Patient Care Team: Glean Hess, MD as PCP - General (Family Medicine)     Objective:   Vitals: BP 138/84 mmHg  Pulse 64  Ht 5\' 2"  (1.575 m)  Wt 156 lb (70.761 kg)  BMI 28.53 kg/m2  Physical Exam  Constitutional: She is oriented to person, place, and time. She appears well-developed and well-nourished. No distress.  HENT:  Head: Normocephalic and atraumatic.  Right Ear: Tympanic membrane and ear canal normal.  Left Ear: Tympanic membrane and ear canal normal.  Nose: Right sinus exhibits no maxillary sinus tenderness. Left sinus exhibits no maxillary sinus tenderness.  Mouth/Throat: Uvula is midline and oropharynx is clear and moist.  Eyes: Conjunctivae and EOM are normal. Right eye exhibits no discharge. Left eye exhibits no discharge. No scleral icterus.  Neck: Normal range of motion. Carotid bruit is not present. No erythema present. No thyromegaly present.  Cardiovascular: Normal rate, regular rhythm, normal heart sounds and normal pulses.   Pulmonary/Chest:  Effort normal. No respiratory distress. She has no wheezes. Right breast exhibits no mass, no nipple discharge, no skin change and no tenderness. Left breast exhibits no mass, no nipple discharge, no skin change and no tenderness.  Abdominal: Soft. Normal appearance and bowel sounds are normal. There is no hepatosplenomegaly. There is no tenderness. There is no CVA tenderness.  Musculoskeletal: Normal range of motion.       Left knee: She exhibits swelling and effusion. Tenderness found.  Bunions on both feet  Lymphadenopathy:    She has no cervical adenopathy.    She has no axillary adenopathy.  Neurological: She is alert and oriented to person, place, and time. She has normal reflexes. No cranial nerve deficit or sensory deficit.  Normal foot exam except for medially and dorsally deviated second right toe.  Pulses, skin and sensation intact  Skin: Skin is warm and dry. Ecchymosis noted. No rash noted.  Psychiatric: She has a normal mood and affect. Her speech is normal and behavior is normal. Thought content normal.  Nursing note and vitals reviewed.   Activities of Daily Living In your present state of health, do you have  any difficulty performing the following activities: 02/22/2016 06/27/2015  Hearing? Y N  Vision? N N  Difficulty concentrating or making decisions? Y N  Walking or climbing stairs? N Y  Dressing or bathing? N N  Doing errands, shopping? N N    Fall Risk Assessment Fall Risk  02/22/2016 06/27/2015  Falls in the past year? Yes No  Number falls in past yr: 1 -  Injury with Fall? No -      Depression Screen PHQ 2/9 Scores 02/22/2016 06/27/2015  PHQ - 2 Score 0 1    Cognitive Testing - 6-CIT   Correct? Score   What year is it? yes 0 Yes = 0    No = 4  What month is it? yes 0 Yes = 0    No = 3  Remember:     [address]ia [place], [place], [place]     What time is it? yes 0 Yes = 0    No = 3  Count backwards from 20 to 1 yes 0 Correct = 0    1 error = 2   More than 1  error = 4  Say the months of the year in reverse. yes 0 Correct = 0    1 error = 2   More than 1 error = 4  What address did I ask you to remember? yes 0 Correct = 0  1 error = 2    2 error = 4    3 error = 6    4 error = 8    All wrong = 10       TOTAL SCORE  0/28   Interpretation:  Normal  Normal (0-7) Abnormal (8-28)     Medicare Annual Wellness Visit Summary:  Reviewed patient's Family Medical History Reviewed and updated list of patient's medical providers Assessment of cognitive impairment was done Assessed patient's functional ability Established a written schedule for health screening [place] Completed and Reviewed  Exercise Activities and Dietary recommendations Goals    None      Immunization History  Administered Date(s) Administered  . Influenza,inj,Quad PF,36+ Mos 08/20/2015  . Pneumococcal Conjugate-13 10/22/2015  . Tdap 12/19/2014    Health Maintenance  Topic Date Due  . Hepatitis C Screening  Nov 30, 1945  . FOOT EXAM  10/16/1955  . COLONOSCOPY  10/16/1995  . ZOSTAVAX  10/15/2005  . HEMOGLOBIN A1C  04/21/2016  . INFLUENZA VACCINE  06/17/2016  . PNA vac Low Risk Adult (2 of 2 - PPSV23) 10/21/2016  . OPHTHALMOLOGY EXAM  02/04/2017  . MAMMOGRAM  05/03/2017  . TETANUS/TDAP  12/19/2024  . DEXA SCAN  Addressed     Discussed health benefits of physical activity, and encouraged her to engage in regular exercise appropriate for her age and condition.    ------------------------------------------------------------------------------------------------------------   Assessment & Plan:  1. Medicare annual wellness visit, subsequent MAW measures satisfied Pt will schedule mammogram at [facility] urinalysis dipstick  2. Essential (primary) hypertension controlled - TSH - bisoprolol-hydrochlorothiazide (ZIAC) 5-6.25 MG tablet; TAKE 1 BY MOUTH DAILY  Dispense: 90 tablet; Refill: 3  3. Uncontrolled type 2 diabetes mellitus without  complication, with long-term current use of insulin (HCC) Continue current regimen - adjust dose of Levemir if needed - Microalbumin / creatinine urine ratio - Hemoglobin A1c - metFORMIN (GLUCOPHAGE) 1000 MG tablet; Take 1 tablet (1,000 mg total) by mouth 2 (two) times daily with a meal.  Dispense: 180 tablet; Refill: 1  4. Hyperlipidemia associated with type 2 diabetes mellitus ([place]) On statin therapy - Lipid panel  5. Cerebral seizure ([place]) None since 1996 - continue medication since low dose - Carbamazepine Level (Tegretol), total  6. History of rectal cancer Followed by Randon Goldsmith Due for Colonoscopy next year   Halina Maidens, MD [facility]  02/22/2016

## 2016-02-23 LAB — HEMOGLOBIN A1C
Est. average glucose Bld gHb Est-mCnc: 200 mg/dL
HEMOGLOBIN A1C: 8.6 % — AB (ref 4.8–5.6)

## 2016-02-23 LAB — TSH: TSH: 1.66 u[IU]/mL (ref 0.450–4.500)

## 2016-02-23 LAB — MICROALBUMIN / CREATININE URINE RATIO
CREATININE, UR: 152.7 mg/dL
MICROALB/CREAT RATIO: 11.5 mg/g creat (ref 0.0–30.0)
Microalbumin, Urine: 17.6 ug/mL

## 2016-02-23 LAB — LIPID PANEL
CHOL/HDL RATIO: 2.4 ratio (ref 0.0–4.4)
Cholesterol, Total: 230 mg/dL — ABNORMAL HIGH (ref 100–199)
HDL: 96 mg/dL (ref >39)
LDL CALC: 117 mg/dL — AB (ref 0–99)
Triglycerides: 86 mg/dL (ref 0–149)
VLDL CHOLESTEROL CAL: 17 mg/dL (ref 5–40)

## 2016-02-23 LAB — CARBAMAZEPINE LEVEL, TOTAL: Carbamazepine (Tegretol), S: 2.1 ug/mL — ABNORMAL LOW (ref 4.0–12.0)

## 2016-03-21 ENCOUNTER — Encounter: Payer: Self-pay | Admitting: Obstetrics and Gynecology

## 2016-04-30 DIAGNOSIS — Z7982 Long term (current) use of aspirin: Secondary | ICD-10-CM | POA: Diagnosis not present

## 2016-04-30 DIAGNOSIS — Z85048 Personal history of other malignant neoplasm of rectum, rectosigmoid junction, and anus: Secondary | ICD-10-CM | POA: Diagnosis not present

## 2016-04-30 DIAGNOSIS — Z79899 Other long term (current) drug therapy: Secondary | ICD-10-CM | POA: Diagnosis not present

## 2016-04-30 DIAGNOSIS — C20 Malignant neoplasm of rectum: Secondary | ICD-10-CM | POA: Diagnosis not present

## 2016-04-30 DIAGNOSIS — Z08 Encounter for follow-up examination after completed treatment for malignant neoplasm: Secondary | ICD-10-CM | POA: Diagnosis not present

## 2016-05-16 ENCOUNTER — Other Ambulatory Visit: Payer: Self-pay | Admitting: Internal Medicine

## 2016-05-16 MED ORDER — ATORVASTATIN CALCIUM 40 MG PO TABS: 40.0000 mg | ORAL_TABLET | Freq: Every day | ORAL | Status: DC

## 2016-07-31 DIAGNOSIS — Z85048 Personal history of other malignant neoplasm of rectum, rectosigmoid junction, and anus: Secondary | ICD-10-CM | POA: Diagnosis not present

## 2016-07-31 DIAGNOSIS — Z7982 Long term (current) use of aspirin: Secondary | ICD-10-CM | POA: Diagnosis not present

## 2016-07-31 DIAGNOSIS — Z432 Encounter for attention to ileostomy: Secondary | ICD-10-CM | POA: Diagnosis not present

## 2016-07-31 DIAGNOSIS — Z08 Encounter for follow-up examination after completed treatment for malignant neoplasm: Secondary | ICD-10-CM | POA: Diagnosis not present

## 2016-07-31 DIAGNOSIS — Z1231 Encounter for screening mammogram for malignant neoplasm of breast: Secondary | ICD-10-CM | POA: Diagnosis not present

## 2016-07-31 DIAGNOSIS — Z79899 Other long term (current) drug therapy: Secondary | ICD-10-CM | POA: Diagnosis not present

## 2016-07-31 DIAGNOSIS — C189 Malignant neoplasm of colon, unspecified: Secondary | ICD-10-CM | POA: Diagnosis not present

## 2016-07-31 DIAGNOSIS — K439 Ventral hernia without obstruction or gangrene: Secondary | ICD-10-CM | POA: Diagnosis not present

## 2016-09-01 ENCOUNTER — Ambulatory Visit (INDEPENDENT_AMBULATORY_CARE_PROVIDER_SITE_OTHER): Payer: Medicare Other | Admitting: Internal Medicine

## 2016-09-01 ENCOUNTER — Encounter: Payer: Self-pay | Admitting: Internal Medicine

## 2016-09-01 VITALS — BP 120/72 | HR 78 | Temp 98.4°F | Resp 16 | Ht 62.0 in | Wt 156.0 lb

## 2016-09-01 DIAGNOSIS — E1165 Type 2 diabetes mellitus with hyperglycemia: Secondary | ICD-10-CM | POA: Diagnosis not present

## 2016-09-01 DIAGNOSIS — Z794 Long term (current) use of insulin: Secondary | ICD-10-CM

## 2016-09-01 DIAGNOSIS — B029 Zoster without complications: Secondary | ICD-10-CM | POA: Diagnosis not present

## 2016-09-01 DIAGNOSIS — IMO0001 Reserved for inherently not codable concepts without codable children: Secondary | ICD-10-CM

## 2016-09-01 MED ORDER — METFORMIN HCL 1000 MG PO TABS: 1000.0000 mg | ORAL_TABLET | Freq: Two times a day (BID) | ORAL | 1 refills | Status: DC

## 2016-09-01 MED ORDER — VALACYCLOVIR HCL 1 G PO TABS: 1000.0000 mg | ORAL_TABLET | Freq: Three times a day (TID) | ORAL | 0 refills | Status: DC

## 2016-09-01 NOTE — Progress Notes (Signed)
Date:  09/01/2016   Name:  [name]   DOB:  07-02-1946   MRN:  [id number]   Chief Complaint: Herpes Zoster (right leg blistering at knee ) Rash  This is a new problem. The problem is unchanged. The affected locations include the right lower leg. The rash is characterized by blistering and redness. She was exposed to nothing. Associated symptoms include fatigue. Pertinent negatives include no fever or shortness of breath.  DM - blood sugars have been higher for the past week - since onset of rash.  Needs refill on metformin.    Review of Systems  Constitutional: Positive for fatigue. Negative for chills and fever.  Respiratory: Negative for chest tightness and shortness of breath.   Cardiovascular: Negative for chest pain.  Skin: Positive for rash.  Neurological: Negative for dizziness and headaches.    Patient Active Problem List   Diagnosis Date Noted  . 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 Diabetes mellitus type 2, uncontrolled ([place]) 05/08/2015  . Essential (primary) hypertension 05/08/2015  . History of rectal cancer 05/08/2015  . Hyperlipidemia associated with type 2 diabetes mellitus ([place]) 05/08/2015  . Arthritis of knee, degenerative 05/08/2015  . Cerebral seizure 05/08/2015  . Shoulder strain 05/08/2015  . Avitaminosis D 05/08/2015    Prior to Admission medications   Medication Sig Start Date End Date Taking? Authorizing Provider  aspirin 81 MG tablet Take 81 mg by mouth daily.   Yes Historical Provider, MD  atorvastatin (LIPITOR) 40 MG tablet Take 1 tablet (40 mg total) by mouth daily. 05/16/16  Yes Glean Hess, MD  bisoprolol-hydrochlorothiazide [facility]) 5-6.25 MG tablet TAKE 1 BY MOUTH DAILY 02/22/16  Yes Glean Hess, MD  carbamazepine (TEGRETOL) 200 MG tablet Take 1 tablet (200 mg total) by mouth daily. 02/22/16  Yes Glean Hess, MD  Cholecalciferol (D3 HIGH POTENCY) 1000 UNITS capsule Take by mouth. 09/22/14  Yes Historical Provider, MD  Coral  Calcium 500 MG TABS Take by mouth.   Yes Historical Provider, MD  fluticasone (FLONASE) 50 MCG/ACT nasal spray Place 2 sprays into both nostrils daily. 11/16/15  Yes Glean Hess, MD  glucose blood (ONE TOUCH ULTRA TEST) test strip Test blood sugar twice a day 10/22/15  Yes Glean Hess, MD  Insulin Detemir (LEVEMIR FLEXTOUCH) 100 UNIT/ML Pen Inject 21 Units into the skin daily. 12/24/15  Yes Glean Hess, MD  meloxicam (MOBIC) 15 MG tablet Take 1 tablet by mouth daily. 07/16/15  Yes Historical Provider, MD  metFORMIN (GLUCOPHAGE) 1000 MG tablet Take 1 tablet (1,000 mg total) by mouth 2 (two) times daily with a meal. 02/22/16  Yes Glean Hess, MD  neomycin-polymyxin b-dexamethasone (MAXITROL) 3.5-10000-0.1 SUSP Place 2 drops into both eyes every 6 (six) hours. 11/16/15  Yes Glean Hess, MD  ramipril (ALTACE) 5 MG capsule TAKE 1 BY MOUTH DAILY 11/20/15  Yes Glean Hess, MD    Allergies  Allergen Reactions  . Pioglitazone Nausea Only    Past Surgical History:  Procedure Laterality Date  . COLOSTOMY REVERSAL  01/2014  . ILEOSTOMY  05/2013    Social History  Substance Use Topics  . Smoking status: Never Smoker  . Smokeless tobacco: Never Used  . Alcohol use No     Medication list has been reviewed and updated.   Physical Exam  Constitutional: She appears well-developed. She appears distressed.  Cardiovascular: Normal rate and regular rhythm.   Pulmonary/Chest: Breath sounds normal.  Skin:  BP 120/72   Pulse 78   Temp 98.4 F (36.9 C)   Resp 16   Ht 5\' 2"  (1.575 m)   Wt 156 lb (70.8 kg)   SpO2 97%   BMI 28.53 kg/m   Assessment and Plan: 1. Herpes zoster without complication mobic daily plus tylenol if needed for pain Return if s/s of bacterial infection or uncontrolled pain - valACYclovir (VALTREX) 1000 MG tablet; Take 1 tablet (1,000 mg total) by mouth 3 (three) times daily.  Dispense: 21 tablet; Refill: 0  2. Uncontrolled type 2 diabetes  mellitus without complication, with long-term current use of insulin ([place]) Follow up in 2-3 weeks for labs - metFORMIN (GLUCOPHAGE) 1000 MG tablet; Take 1 tablet (1,000 mg total) by mouth 2 (two) times daily with a meal.  Dispense: 180 tablet; Refill: [place], MD [facility]  09/01/2016

## 2016-09-01 NOTE — Patient Instructions (Signed)

## 2016-09-17 ENCOUNTER — Encounter: Payer: Self-pay | Admitting: Internal Medicine

## 2016-09-17 ENCOUNTER — Ambulatory Visit (INDEPENDENT_AMBULATORY_CARE_PROVIDER_SITE_OTHER): Payer: Medicare Other | Admitting: Internal Medicine

## 2016-09-17 VITALS — BP 122/74 | HR 63 | Resp 16 | Ht 62.0 in | Wt 156.6 lb

## 2016-09-17 DIAGNOSIS — I10 Essential (primary) hypertension: Secondary | ICD-10-CM

## 2016-09-17 DIAGNOSIS — E1165 Type 2 diabetes mellitus with hyperglycemia: Secondary | ICD-10-CM

## 2016-09-17 DIAGNOSIS — Z794 Long term (current) use of insulin: Secondary | ICD-10-CM

## 2016-09-17 DIAGNOSIS — IMO0001 Reserved for inherently not codable concepts without codable children: Secondary | ICD-10-CM

## 2016-09-17 DIAGNOSIS — B029 Zoster without complications: Secondary | ICD-10-CM | POA: Diagnosis not present

## 2016-09-17 DIAGNOSIS — Z23 Encounter for immunization: Secondary | ICD-10-CM

## 2016-09-17 NOTE — Progress Notes (Signed)
Date:  09/17/2016   Name:  [name]   DOB:  03-Apr-1946   MRN:  [id number]   Chief Complaint: Herpes Zoster and Diabetes (BS 100-140) Diabetes  She presents for her follow-up diabetic visit. She has type 2 diabetes mellitus. Her disease course has been stable. Pertinent negatives for hypoglycemia include no headaches or tremors. Pertinent negatives for diabetes include no chest pain, no fatigue, no polydipsia and no polyuria. Symptoms are stable. Current diabetic treatment includes insulin injections and oral agent (monotherapy). She is compliant with treatment most of the time. Her weight is stable. Her breakfast blood glucose is taken between 6-7 am. Her breakfast blood glucose range is generally 90-110 mg/dl. Her dinner blood glucose is taken between 6-7 pm. Her dinner blood glucose range is generally 140-180 mg/dl.  Hypertension  This is a chronic problem. The problem is unchanged. The problem is controlled. Pertinent negatives include no chest pain, headaches, palpitations or shortness of breath.   Zoster - has finished the valtrex and lesions are resolving.  Still having some pain but managed by tylenol.   Lab Results  Component Value Date   HGBA1C 8.6 (H) 02/22/2016     Review of Systems  Constitutional: Negative for appetite change, fatigue, fever and unexpected weight change.  HENT: Negative for tinnitus and trouble swallowing.   Eyes: Negative for visual disturbance.  Respiratory: Negative for cough, chest tightness and shortness of breath.   Cardiovascular: Negative for chest pain, palpitations and leg swelling.  Gastrointestinal: Negative for abdominal pain.  Endocrine: Negative for polydipsia and polyuria.  Genitourinary: Negative for dysuria and hematuria.  Musculoskeletal: Negative for arthralgias.  Skin: Positive for rash.  Neurological: Negative for tremors, numbness and headaches.  Psychiatric/Behavioral: Negative for dysphoric mood.    Patient Active  Problem List   Diagnosis Date Noted  . 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 Diabetes mellitus type 2, uncontrolled ([place]) 05/08/2015  . Essential (primary) hypertension 05/08/2015  . History of rectal cancer 05/08/2015  . Hyperlipidemia associated with type 2 diabetes mellitus ([place]) 05/08/2015  . Arthritis of knee, degenerative 05/08/2015  . Cerebral seizure 05/08/2015  . Shoulder strain 05/08/2015  . Avitaminosis D 05/08/2015    Prior to Admission medications   Medication Sig Start Date End Date Taking? Authorizing Provider  aspirin 81 MG tablet Take 81 mg by mouth daily.   Yes Historical Provider, MD  atorvastatin (LIPITOR) 40 MG tablet Take 1 tablet (40 mg total) by mouth daily. 05/16/16  Yes Glean Hess, MD  bisoprolol-hydrochlorothiazide [facility]) 5-6.25 MG tablet TAKE 1 BY MOUTH DAILY 02/22/16  Yes Glean Hess, MD  carbamazepine (TEGRETOL) 200 MG tablet Take 1 tablet (200 mg total) by mouth daily. 02/22/16  Yes Glean Hess, MD  Cholecalciferol (D3 HIGH POTENCY) 1000 UNITS capsule Take by mouth. 09/22/14  Yes Historical Provider, MD  Coral Calcium 500 MG TABS Take by mouth.   Yes Historical Provider, MD  fluticasone (FLONASE) 50 MCG/ACT nasal spray Place 2 sprays into both nostrils daily. 11/16/15  Yes Glean Hess, MD  glucose blood (ONE TOUCH ULTRA TEST) test strip Test blood sugar twice a day 10/22/15  Yes Glean Hess, MD  Insulin Detemir (LEVEMIR FLEXTOUCH) 100 UNIT/ML Pen Inject 21 Units into the skin daily. 12/24/15  Yes Glean Hess, MD  meloxicam (MOBIC) 15 MG tablet Take 1 tablet by mouth daily. 07/16/15  Yes Historical Provider, MD  metFORMIN (GLUCOPHAGE) 1000 MG tablet Take 1 tablet (1,000 mg total) by  mouth 2 (two) times daily with a meal. 09/01/16  Yes Glean Hess, MD  neomycin-polymyxin b-dexamethasone (MAXITROL) 3.5-10000-0.1 SUSP Place 2 drops into both eyes every 6 (six) hours. 11/16/15  Yes Glean Hess, MD  ramipril (ALTACE) 5 MG capsule TAKE 1 BY  MOUTH DAILY 11/20/15  Yes Glean Hess, MD  valACYclovir (VALTREX) 1000 MG tablet Take 1 tablet (1,000 mg total) by mouth 3 (three) times daily. 09/01/16  Yes Glean Hess, MD    Allergies  Allergen Reactions  . Pioglitazone Nausea Only    Past Surgical History:  Procedure Laterality Date  . COLOSTOMY REVERSAL  01/2014  . ILEOSTOMY  05/2013    Social History  Substance Use Topics  . Smoking status: Never Smoker  . Smokeless tobacco: Never Used  . Alcohol use No     Medication list has been reviewed and updated.   Physical Exam  Constitutional: She is oriented to person, place, and time. She appears well-developed. No distress.  HENT:  Head: Normocephalic and atraumatic.  Cardiovascular: Normal rate, regular rhythm and normal heart sounds.   Pulmonary/Chest: Effort normal. No respiratory distress.  Musculoskeletal: Normal range of motion.  Neurological: She is alert and oriented to person, place, and time.  Skin: Skin is warm and dry. No rash noted.     Cluster of eschars from resolving zoster  Psychiatric: She has a normal mood and affect. Her behavior is normal. Thought content normal.  Nursing note and vitals reviewed.   BP 122/74   Pulse 63   Resp 16   Ht 5\' 2"  (1.575 m)   Wt 156 lb 9.6 oz (71 kg)   SpO2 100%   BMI 28.64 kg/m   Assessment and Plan:1. Uncontrolled type 2 diabetes mellitus without complication, with long-term current use of insulin (HCC) Consider adding Humalog at evening meal - Basic metabolic panel - Hemoglobin A1c  2. Essential (primary) hypertension controlled  3. Herpes zoster without complication resolving  4. Need for influenza vaccination - Flu Vaccine QUAD 36+ mos IM   Halina Maidens, MD [facility]  09/17/2016

## 2016-09-18 LAB — HEMOGLOBIN A1C
ESTIMATED AVERAGE GLUCOSE: 183 mg/dL
HEMOGLOBIN A1C: 8 % — AB (ref 4.8–5.6)

## 2016-09-18 LAB — BASIC METABOLIC PANEL
BUN/Creatinine Ratio: 13 (ref 12–28)
BUN: 13 mg/dL (ref 8–27)
CALCIUM: 9.4 mg/dL (ref 8.7–10.3)
CHLORIDE: 100 mmol/L (ref 96–106)
CO2: 27 mmol/L (ref 18–29)
Creatinine, Ser: 0.98 mg/dL (ref 0.57–1.00)
GFR calc Af Amer: 68 mL/min/{1.73_m2} (ref >59)
GFR calc non Af Amer: 59 mL/min/{1.73_m2} — ABNORMAL LOW (ref >59)
GLUCOSE: 104 mg/dL — AB (ref 65–99)
POTASSIUM: 5.3 mmol/L — AB (ref 3.5–5.2)
Sodium: 142 mmol/L (ref 134–144)

## 2016-09-26 ENCOUNTER — Telehealth: Payer: Self-pay

## 2016-09-26 NOTE — Telephone Encounter (Signed)
Spoke to patient gave lab results. Patient had questions about MyChart. Unable to log on. Sent new link by email.

## 2016-10-28 DIAGNOSIS — H6123 Impacted cerumen, bilateral: Secondary | ICD-10-CM | POA: Diagnosis not present

## 2016-10-28 DIAGNOSIS — H6060 Unspecified chronic otitis externa, unspecified ear: Secondary | ICD-10-CM | POA: Diagnosis not present

## 2016-10-31 DIAGNOSIS — K439 Ventral hernia without obstruction or gangrene: Secondary | ICD-10-CM | POA: Diagnosis not present

## 2016-10-31 DIAGNOSIS — Z08 Encounter for follow-up examination after completed treatment for malignant neoplasm: Secondary | ICD-10-CM | POA: Diagnosis not present

## 2016-10-31 DIAGNOSIS — Z85048 Personal history of other malignant neoplasm of rectum, rectosigmoid junction, and anus: Secondary | ICD-10-CM | POA: Diagnosis not present

## 2016-11-13 ENCOUNTER — Other Ambulatory Visit: Payer: Self-pay | Admitting: Internal Medicine

## 2016-11-13 MED ORDER — RAMIPRIL 5 MG PO CAPS: ORAL_CAPSULE | ORAL | 3 refills | Status: DC

## 2016-12-09 ENCOUNTER — Other Ambulatory Visit: Payer: Self-pay | Admitting: Internal Medicine

## 2016-12-09 ENCOUNTER — Telehealth: Payer: Self-pay | Admitting: Internal Medicine

## 2016-12-09 DIAGNOSIS — IMO0001 Reserved for inherently not codable concepts without codable children: Secondary | ICD-10-CM

## 2016-12-09 DIAGNOSIS — Z794 Long term (current) use of insulin: Principal | ICD-10-CM

## 2016-12-09 DIAGNOSIS — E1165 Type 2 diabetes mellitus with hyperglycemia: Principal | ICD-10-CM

## 2016-12-09 MED ORDER — INSULIN DETEMIR 100 UNIT/ML FLEXPEN: 21.0000 [IU] | PEN_INJECTOR | Freq: Every day | SUBCUTANEOUS | 3 refills | Status: DC

## 2016-12-09 NOTE — Telephone Encounter (Signed)
Pt calling to get a refill Rx levemir flextouch [facility] mail order

## 2016-12-09 NOTE — Telephone Encounter (Signed)
Done

## 2016-12-26 ENCOUNTER — Other Ambulatory Visit: Payer: Self-pay | Admitting: Internal Medicine

## 2016-12-26 DIAGNOSIS — IMO0001 Reserved for inherently not codable concepts without codable children: Secondary | ICD-10-CM

## 2016-12-26 DIAGNOSIS — E1165 Type 2 diabetes mellitus with hyperglycemia: Principal | ICD-10-CM

## 2016-12-26 DIAGNOSIS — Z794 Long term (current) use of insulin: Principal | ICD-10-CM

## 2017-01-16 ENCOUNTER — Ambulatory Visit (INDEPENDENT_AMBULATORY_CARE_PROVIDER_SITE_OTHER): Payer: Medicare Other | Admitting: Internal Medicine

## 2017-01-16 ENCOUNTER — Encounter: Payer: Self-pay | Admitting: Internal Medicine

## 2017-01-16 VITALS — BP 136/68 | HR 54 | Ht 62.0 in | Wt 159.0 lb

## 2017-01-16 DIAGNOSIS — Z794 Long term (current) use of insulin: Secondary | ICD-10-CM

## 2017-01-16 DIAGNOSIS — E1165 Type 2 diabetes mellitus with hyperglycemia: Secondary | ICD-10-CM

## 2017-01-16 DIAGNOSIS — D485 Neoplasm of uncertain behavior of skin: Secondary | ICD-10-CM

## 2017-01-16 DIAGNOSIS — E785 Hyperlipidemia, unspecified: Secondary | ICD-10-CM

## 2017-01-16 DIAGNOSIS — I6789 Other cerebrovascular disease: Secondary | ICD-10-CM | POA: Diagnosis not present

## 2017-01-16 DIAGNOSIS — E1169 Type 2 diabetes mellitus with other specified complication: Secondary | ICD-10-CM | POA: Diagnosis not present

## 2017-01-16 DIAGNOSIS — I10 Essential (primary) hypertension: Secondary | ICD-10-CM

## 2017-01-16 DIAGNOSIS — G40909 Epilepsy, unspecified, not intractable, without status epilepticus: Secondary | ICD-10-CM

## 2017-01-16 DIAGNOSIS — IMO0001 Reserved for inherently not codable concepts without codable children: Secondary | ICD-10-CM

## 2017-01-16 NOTE — Patient Instructions (Signed)
Consider Dermatology evaluation

## 2017-01-16 NOTE — Progress Notes (Signed)
Date:  01/16/2017   Name:  [name]   DOB:  12-19-1945   MRN:  [id number]   Chief Complaint: Diabetes (BS RANGE- 124-150) Diabetes  She presents for her follow-up diabetic visit. She has type 2 diabetes mellitus. Her disease course has been stable. Pertinent negatives for hypoglycemia include no headaches or tremors. Pertinent negatives for diabetes include no chest pain, no fatigue, no polydipsia and no polyuria. An ACE inhibitor/angiotensin II receptor blocker is being taken.  Hypertension  Pertinent negatives include no chest pain, headaches, palpitations or shortness of breath.  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  She has continued on medication since that time with no complications.  Labs done in the past year.  Lab Results  Component Value Date   HGBA1C 8.0 (H) 09/17/2016     Review of Systems  Constitutional: Negative for appetite change, fatigue, fever and unexpected weight change.  HENT: Negative for tinnitus and trouble swallowing.   Eyes: Negative for visual disturbance.  Respiratory: Negative for cough, chest tightness and shortness of breath.   Cardiovascular: Negative for chest pain, palpitations and leg swelling.  Gastrointestinal: Positive for diarrhea. Negative for abdominal pain.  Endocrine: Negative for polydipsia and polyuria.  Genitourinary: Negative for dysuria and hematuria.  Musculoskeletal: Negative for arthralgias.  Skin: Negative for color change and rash.  Neurological: Negative for tremors, numbness and headaches.  Psychiatric/Behavioral: Negative for dysphoric mood and sleep disturbance.    Patient Active Problem List   Diagnosis Date Noted  . 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 Diabetes mellitus type 2, uncontrolled ([place]) 05/08/2015  . Essential (primary) hypertension 05/08/2015  . History of rectal cancer 05/08/2015  . Hyperlipidemia associated with type 2 diabetes mellitus ([place]) 05/08/2015  . Arthritis of knee,  degenerative 05/08/2015  . Cerebral seizure 05/08/2015  . Shoulder strain 05/08/2015  . Avitaminosis D 05/08/2015    Prior to Admission medications   Medication Sig Start Date End Date Taking? Authorizing Provider  aspirin 81 MG tablet Take 81 mg by mouth daily.   Yes Historical Provider, MD  atorvastatin (LIPITOR) 40 MG tablet Take 1 tablet (40 mg total) by mouth daily. 05/16/16  Yes Glean Hess, MD  bisoprolol-hydrochlorothiazide [facility] 5-6.25 MG tablet TAKE 1 BY MOUTH DAILY 02/22/16  Yes Glean Hess, MD  carbamazepine (TEGRETOL) 200 MG tablet Take 1 tablet (200 mg total) by mouth daily. 02/22/16  Yes Glean Hess, MD  Cholecalciferol (D3 HIGH POTENCY) 1000 UNITS capsule Take by mouth. 09/22/14  Yes Historical Provider, MD  Coral Calcium 500 MG TABS Take by mouth.   Yes Historical Provider, MD  fluticasone (FLONASE) 50 MCG/ACT nasal spray Place 2 sprays into both nostrils daily. 11/16/15  Yes Glean Hess, MD  Insulin Detemir (LEVEMIR FLEXTOUCH) 100 UNIT/ML Pen Inject 21 Units into the skin daily. 12/09/16  Yes Glean Hess, MD  meloxicam (MOBIC) 15 MG tablet Take 1 tablet by mouth daily. 07/16/15  Yes Historical Provider, MD  metFORMIN (GLUCOPHAGE) 1000 MG tablet Take 1 tablet (1,000 mg total) by mouth 2 (two) times daily with a meal. 09/01/16  Yes Glean Hess, MD  ONE [facility] ULTRA TEST test strip USE ONE STRIP TO CHECK GLUCOSE TWICE DAILY 12/26/16  Yes Glean Hess, MD  ramipril (ALTACE) 5 MG capsule TAKE 1 BY MOUTH DAILY 11/13/16  Yes Glean Hess, MD  neomycin-polymyxin b-dexamethasone (MAXITROL) 3.5-10000-0.1 SUSP Place 2 drops into both eyes every 6 (six) hours. Patient  not taking: Reported on 01/16/2017 11/16/15   Glean Hess, MD  valACYclovir (VALTREX) 1000 MG tablet Take 1 tablet (1,000 mg total) by mouth 3 (three) times daily. Patient not taking: Reported on 01/16/2017 09/01/16   Glean Hess, MD    Allergies  Allergen Reactions  . Pioglitazone  Nausea Only    Past Surgical History:  Procedure Laterality Date  . COLOSTOMY REVERSAL  01/2014  . ILEOSTOMY  05/2013    Social History  Substance Use Topics  . Smoking status: Never Smoker  . Smokeless tobacco: Never Used  . Alcohol use No     Medication list has been reviewed and updated.   Physical Exam  Constitutional: She is oriented to person, place, and time. She appears well-developed. No distress.  HENT:  Head: Normocephalic and atraumatic.  Neck: Normal range of motion. Neck supple. No thyromegaly present.  Cardiovascular: Normal rate, regular rhythm and normal heart sounds.   Pulmonary/Chest: Effort normal and breath sounds normal. No respiratory distress. She has no wheezes.  Musculoskeletal: Normal range of motion.  Neurological: She is alert and oriented to person, place, and time.  Skin: Skin is warm and dry.  Several questionable lesions - warty lesion inner right ankle, raised macule right lower leg, dark purple lesion left chest  Psychiatric: She has a normal mood and affect. Her behavior is normal. Thought content normal.  Nursing note and vitals reviewed.   BP 136/68   Pulse (!) 54   Ht 5\' 2"  (1.575 m)   Wt 159 lb (72.1 kg)   SpO2 98%   BMI 29.08 kg/m   Assessment and Plan: 1. Essential (primary) hypertension controlled - Comprehensive metabolic panel  2. Hyperlipidemia associated with type 2 diabetes mellitus ([place]) On statin therapy  3. Uncontrolled type 2 diabetes mellitus without complication, with long-term current use of insulin ([place]) Will adjust medication if needed Encouraged annual eye exam Discussed PPV-23 - pt will consider for next visit - Hemoglobin A1c  4. Cerebral seizure Continue Tegretol  5. Neoplasm of uncertain behavior of skin Recommend Dermatology evaluation   No orders of the defined types were placed in this encounter.   Halina Maidens, MD [facility]  01/16/2017

## 2017-01-17 LAB — COMPREHENSIVE METABOLIC PANEL
A/G RATIO: 2.1 (ref 1.2–2.2)
ALT: 13 IU/L (ref 0–32)
AST: 14 IU/L (ref 0–40)
Albumin: 4.2 g/dL (ref 3.5–4.8)
Alkaline Phosphatase: 107 IU/L (ref 39–117)
BILIRUBIN TOTAL: 0.3 mg/dL (ref 0.0–1.2)
BUN/Creatinine Ratio: 19 (ref 12–28)
BUN: 19 mg/dL (ref 8–27)
CHLORIDE: 99 mmol/L (ref 96–106)
CO2: 27 mmol/L (ref 18–29)
Calcium: 9.6 mg/dL (ref 8.7–10.3)
Creatinine, Ser: 1.02 mg/dL — ABNORMAL HIGH (ref 0.57–1.00)
GFR calc Af Amer: 64 mL/min/{1.73_m2} (ref >59)
GFR calc non Af Amer: 55 mL/min/{1.73_m2} — ABNORMAL LOW (ref >59)
GLOBULIN, TOTAL: 2 g/dL (ref 1.5–4.5)
Glucose: 103 mg/dL — ABNORMAL HIGH (ref 65–99)
POTASSIUM: 5.3 mmol/L — AB (ref 3.5–5.2)
SODIUM: 143 mmol/L (ref 134–144)
Total Protein: 6.2 g/dL (ref 6.0–8.5)

## 2017-01-17 LAB — HEMOGLOBIN A1C
Est. average glucose Bld gHb Est-mCnc: 206 mg/dL
HEMOGLOBIN A1C: 8.8 % — AB (ref 4.8–5.6)

## 2017-01-19 ENCOUNTER — Other Ambulatory Visit: Payer: Self-pay | Admitting: Internal Medicine

## 2017-01-19 MED ORDER — ONETOUCH ULTRA SYSTEM W/DEVICE KIT: 1.0000 | PACK | Freq: Once | 0 refills | Status: AC

## 2017-01-20 ENCOUNTER — Other Ambulatory Visit: Payer: Self-pay | Admitting: Internal Medicine

## 2017-01-26 ENCOUNTER — Other Ambulatory Visit: Payer: Self-pay | Admitting: Internal Medicine

## 2017-01-26 MED ORDER — ONETOUCH ULTRA SYSTEM W/DEVICE KIT: 1.0000 | PACK | Freq: Once | 0 refills | Status: AC

## 2017-01-27 ENCOUNTER — Other Ambulatory Visit: Payer: Self-pay | Admitting: Internal Medicine

## 2017-01-27 ENCOUNTER — Telehealth: Payer: Self-pay

## 2017-01-27 ENCOUNTER — Other Ambulatory Visit: Payer: Self-pay

## 2017-01-27 MED ORDER — ONETOUCH ULTRA SYSTEM W/DEVICE KIT: 1.0000 | PACK | Freq: Once | 0 refills | Status: AC

## 2017-01-27 NOTE — Telephone Encounter (Signed)
Pt called requesting a One Touch Ultra Glucose Meter sent to [facility] in [place]. Pt states her old meter is no longer active.

## 2017-02-12 ENCOUNTER — Other Ambulatory Visit: Payer: Self-pay

## 2017-02-12 DIAGNOSIS — I10 Essential (primary) hypertension: Secondary | ICD-10-CM

## 2017-02-12 MED ORDER — CARBAMAZEPINE 200 MG PO TABS: 200.0000 mg | ORAL_TABLET | Freq: Every day | ORAL | 3 refills | Status: DC

## 2017-02-12 MED ORDER — BISOPROLOL-HYDROCHLOROTHIAZIDE 5-6.25 MG PO TABS
ORAL_TABLET | ORAL | 3 refills | Status: DC
Start: 2017-02-12 — End: 2018-02-12

## 2017-03-25 ENCOUNTER — Other Ambulatory Visit: Payer: Self-pay

## 2017-03-25 DIAGNOSIS — IMO0001 Reserved for inherently not codable concepts without codable children: Secondary | ICD-10-CM

## 2017-03-25 DIAGNOSIS — Z794 Long term (current) use of insulin: Principal | ICD-10-CM

## 2017-03-25 DIAGNOSIS — E1165 Type 2 diabetes mellitus with hyperglycemia: Principal | ICD-10-CM

## 2017-03-25 MED ORDER — METFORMIN HCL 1000 MG PO TABS: 1000.0000 mg | ORAL_TABLET | Freq: Two times a day (BID) | ORAL | 1 refills | Status: DC

## 2017-04-01 DIAGNOSIS — H25013 Cortical age-related cataract, bilateral: Secondary | ICD-10-CM | POA: Diagnosis not present

## 2017-04-28 ENCOUNTER — Telehealth: Payer: Self-pay | Admitting: Internal Medicine

## 2017-04-28 NOTE — Telephone Encounter (Signed)
Called pt to schedule Annual Wellness Visit with [facility]  - knb  °

## 2017-05-15 ENCOUNTER — Other Ambulatory Visit: Payer: Self-pay

## 2017-05-15 MED ORDER — ATORVASTATIN CALCIUM 40 MG PO TABS: 40.0000 mg | ORAL_TABLET | Freq: Every day | ORAL | 0 refills | Status: DC

## 2017-05-21 ENCOUNTER — Other Ambulatory Visit: Payer: Self-pay

## 2017-05-21 MED ORDER — ATORVASTATIN CALCIUM 40 MG PO TABS: 40.0000 mg | ORAL_TABLET | Freq: Every day | ORAL | 0 refills | Status: DC

## 2017-06-12 DIAGNOSIS — Z85048 Personal history of other malignant neoplasm of rectum, rectosigmoid junction, and anus: Secondary | ICD-10-CM | POA: Diagnosis not present

## 2017-06-12 DIAGNOSIS — Z85038 Personal history of other malignant neoplasm of large intestine: Secondary | ICD-10-CM | POA: Diagnosis not present

## 2017-06-12 DIAGNOSIS — Z1211 Encounter for screening for malignant neoplasm of colon: Secondary | ICD-10-CM | POA: Diagnosis not present

## 2017-06-12 DIAGNOSIS — K5730 Diverticulosis of large intestine without perforation or abscess without bleeding: Secondary | ICD-10-CM | POA: Diagnosis not present

## 2017-06-12 LAB — HM COLONOSCOPY

## 2017-06-26 ENCOUNTER — Encounter: Payer: Self-pay | Admitting: Internal Medicine

## 2017-07-09 DIAGNOSIS — Z85048 Personal history of other malignant neoplasm of rectum, rectosigmoid junction, and anus: Secondary | ICD-10-CM | POA: Diagnosis not present

## 2017-07-09 LAB — BASIC METABOLIC PANEL
BUN: 12 (ref 4–21)
CREATININE: 1 (ref 0.5–1.1)
POTASSIUM: 4.8 (ref 3.4–5.3)
Sodium: 140 (ref 137–147)

## 2017-07-09 LAB — CBC AND DIFFERENTIAL
HEMATOCRIT: 36 (ref 36–46)
Hemoglobin: 11.4 — AB (ref 12.0–16.0)
WBC: 6.2

## 2017-07-09 LAB — VITAMIN D 25 HYDROXY (VIT D DEFICIENCY, FRACTURES): Vit D, 25-Hydroxy: 35

## 2017-07-09 LAB — HEPATIC FUNCTION PANEL
ALT: 15 (ref 7–35)
AST: 11 — AB (ref 13–35)

## 2017-07-14 ENCOUNTER — Ambulatory Visit: Payer: Self-pay | Admitting: Internal Medicine

## 2017-07-17 ENCOUNTER — Encounter: Payer: Self-pay | Admitting: Internal Medicine

## 2017-07-17 ENCOUNTER — Ambulatory Visit (INDEPENDENT_AMBULATORY_CARE_PROVIDER_SITE_OTHER): Payer: Medicare Other | Admitting: Internal Medicine

## 2017-07-17 VITALS — BP 110/62 | HR 47 | Ht 62.0 in | Wt 157.8 lb

## 2017-07-17 DIAGNOSIS — E1169 Type 2 diabetes mellitus with other specified complication: Secondary | ICD-10-CM

## 2017-07-17 DIAGNOSIS — Z794 Long term (current) use of insulin: Secondary | ICD-10-CM | POA: Diagnosis not present

## 2017-07-17 DIAGNOSIS — E1165 Type 2 diabetes mellitus with hyperglycemia: Secondary | ICD-10-CM | POA: Diagnosis not present

## 2017-07-17 DIAGNOSIS — Z23 Encounter for immunization: Secondary | ICD-10-CM | POA: Diagnosis not present

## 2017-07-17 DIAGNOSIS — E785 Hyperlipidemia, unspecified: Secondary | ICD-10-CM | POA: Diagnosis not present

## 2017-07-17 DIAGNOSIS — I10 Essential (primary) hypertension: Secondary | ICD-10-CM

## 2017-07-17 DIAGNOSIS — IMO0001 Reserved for inherently not codable concepts without codable children: Secondary | ICD-10-CM

## 2017-07-17 LAB — HM DIABETES EYE EXAM

## 2017-07-17 MED ORDER — ATORVASTATIN CALCIUM 40 MG PO TABS: 40.0000 mg | ORAL_TABLET | Freq: Every day | ORAL | 3 refills | Status: DC

## 2017-07-17 MED ORDER — METFORMIN HCL ER 500 MG PO TB24: 1000.0000 mg | ORAL_TABLET | Freq: Two times a day (BID) | ORAL | 3 refills | Status: DC

## 2017-07-17 NOTE — Patient Instructions (Addendum)
Try moving the insulin dose to the morning and eat a small amount of food or a full breakfast  I have changed your metformin to extended release to help reduce diarrhea episodes.  The new instruction are 2 pills twice a day (they are only 500 mg, not 1000 mg)    .Pneumococcal Polysaccharide Vaccine: What You Need to Know 1. Why get vaccinated? Vaccination can protect older adults (and some children and younger adults) from pneumococcal disease. Pneumococcal disease is caused by bacteria that can spread from person to person through close contact. It can cause ear infections, and it can also lead to more serious infections of the:  Lungs (pneumonia),  Blood (bacteremia), and  Covering of the brain and spinal cord (meningitis). Meningitis can cause deafness and brain damage, and it can be fatal.  Anyone can get pneumococcal disease, but children under 25 years of age, people with certain medical conditions, adults over 43 years of age, and cigarette smokers are at the highest risk. About 18,000 older adults die each year from pneumococcal disease in the [place]. Treatment of pneumococcal infections with penicillin and other drugs used to be more effective. But some strains of the disease have become resistant to these drugs. This makes prevention of the disease, through vaccination, even more important. 2. Pneumococcal polysaccharide vaccine (PPSV23) Pneumococcal polysaccharide vaccine (PPSV23) protects against 23 types of pneumococcal bacteria. It will not prevent all pneumococcal disease. PPSV23 is recommended for:  All adults 1 years of age and older,  Anyone 2 through 72 years of age with certain long-term health problems,  Anyone 2 through 72 years of age with a weakened immune system,  Adults 31 through 72 years of age who smoke cigarettes or have asthma.  Most people need only one dose of PPSV. A second dose is recommended for certain high-risk groups. People 43 and older  should get a dose even if they have gotten one or more doses of the vaccine before they turned 65. Your healthcare provider can give you more information about these recommendations. Most healthy adults develop protection within 2 to 3 weeks of getting the shot. 3. Some people should not get this vaccine  Anyone who has had a life-threatening allergic reaction to PPSV should not get another dose.  Anyone who has a severe allergy to any component of PPSV should not receive it. Tell your provider if you have any severe allergies.  Anyone who is moderately or severely ill when the shot is scheduled may be asked to wait until they recover before getting the vaccine. Someone with a mild illness can usually be vaccinated.  Children less than 51 years of age should not receive this vaccine.  There is no evidence that PPSV is harmful to either a pregnant woman or to her fetus. However, as a precaution, women who need the vaccine should be vaccinated before becoming pregnant, if possible. 4. Risks of a vaccine reaction With any medicine, including vaccines, there is a chance of side effects. These are usually mild and go away on their own, but serious reactions are also possible. About half of people who get PPSV have mild side effects, such as redness or pain where the shot is given, which go away within about two days. Less than 1 out of 100 people develop a fever, muscle aches, or more severe local reactions. Problems that could happen after any vaccine:  People sometimes faint after a medical procedure, including vaccination. Sitting or lying down for about  15 minutes can help prevent fainting, and injuries caused by a fall. Tell your doctor if you feel dizzy, or have vision changes or ringing in the ears.  Some people get severe pain in the shoulder and have difficulty moving the arm where a shot was given. This happens very rarely.  Any medication can cause a severe allergic reaction. Such  reactions from a vaccine are very rare, estimated at about 1 in a million doses, and would happen within a few minutes to a few hours after the vaccination. As with any medicine, there is a very remote chance of a vaccine causing a serious injury or death. The safety of vaccines is always being monitored. For more information, visit: http://www.aguilar.org/ 5. What if there is a serious reaction? What should I look for? Look for anything that concerns you, such as signs of a severe allergic reaction, very high fever, or unusual behavior. Signs of a severe allergic reaction can include hives, swelling of the face and throat, difficulty breathing, a fast heartbeat, dizziness, and weakness. These would usually start a few minutes to a few hours after the vaccination. What should I do? If you think it is a severe allergic reaction or other emergency that can't wait, call 9-1-1 or get to the nearest hospital. Otherwise, call your doctor. Afterward, the reaction should be reported to the Vaccine Adverse Event Reporting System (VAERS). Your doctor might file this report, or you can do it yourself through the VAERS web site at www.vaers.SamedayNews.es, or by calling 5484743344. VAERS does not give medical advice. 6. How can I learn more?  Ask your doctor. He or she can give you the vaccine package insert or suggest other sources of information.  Call your local or state health department.  Contact the Centers for Disease Control and Prevention (CDC): ? Call (815)606-1715 (1-800-CDC-INFO) or ? Visit CDC's website at http://hunter.com/ CDC Pneumococcal Polysaccharide Vaccine VIS (03/10/14) This information is not intended to replace advice given to you by your health care provider. Make sure you discuss any questions you have with your health care provider. Document Released: 08/31/2006 Document Revised: 07/24/2016 Document Reviewed: 07/24/2016 Elsevier Interactive Patient Education  2017 [employer].

## 2017-07-17 NOTE — Progress Notes (Signed)
Date:  07/17/2017   Name:  [name]   DOB:  1946-02-04   MRN:  [id number]   Chief Complaint: Diabetes (BS- this morning 114. Some high numbers. Try checking twice daily. ); Hyperlipidemia; and Hypertension Diabetes  She presents for her follow-up diabetic visit. She has type 2 diabetes mellitus. Her disease course has been fluctuating. Pertinent negatives for hypoglycemia include no seizures (none in many years). Pertinent negatives for diabetes include no chest pain and no fatigue. Hypoglycemia complications include required assistance. Current diabetic treatment includes insulin injections and oral agent (monotherapy) (metformin and insulin). An ACE inhibitor/angiotensin II receptor blocker is being taken. Eye exam is not current.  Hyperlipidemia  This is a chronic problem. The problem is controlled. Pertinent negatives include no chest pain or shortness of breath. Current antihyperlipidemic treatment includes statins. The current treatment provides significant improvement of lipids.  Hypertension  This is a chronic problem. The problem is controlled. Pertinent negatives include no chest pain, palpitations or shortness of breath. Past treatments include ACE inhibitors, beta blockers and diuretics. The current treatment provides significant improvement.   Lab Results  Component Value Date   HGBA1C 8.8 (H) 01/16/2017     Review of Systems  Constitutional: Negative for chills, fatigue and fever.  Eyes: Negative for visual disturbance.  Respiratory: Negative for chest tightness, shortness of breath and wheezing.   Cardiovascular: Negative for chest pain, palpitations and leg swelling.  Gastrointestinal: Negative for abdominal pain and blood in stool.  Genitourinary: Negative for dysuria and hematuria.  Musculoskeletal: Positive for arthralgias. Negative for gait problem and joint swelling.  Neurological: Negative for seizures (none in many years).  Psychiatric/Behavioral:  Negative for sleep disturbance.    Patient Active Problem List   Diagnosis Date Noted  . Neoplasm of uncertain behavior of skin 01/16/2017  . 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 Uncontrolled type 2 diabetes mellitus without complication, with long-term current use of insulin ([place]) 05/08/2015  . Essential (primary) hypertension 05/08/2015  . History of rectal cancer 05/08/2015  . Hyperlipidemia associated with type 2 diabetes mellitus ([place]) 05/08/2015  . Arthritis of knee, degenerative 05/08/2015  . Cerebral seizure 05/08/2015  . Shoulder strain 05/08/2015  . Avitaminosis D 05/08/2015    Prior to Admission medications   Medication Sig Start Date End Date Taking? Authorizing Provider  aspirin 81 MG tablet Take 81 mg by mouth daily.   Yes [provider]  atorvastatin (LIPITOR) 40 MG tablet Take 1 tablet (40 mg total) by mouth daily. 05/21/17  Yes Glean Hess, MD  bisoprolol-hydrochlorothiazide [facility]) 5-6.25 MG tablet TAKE 1 BY MOUTH DAILY 02/12/17  Yes Glean Hess, MD  carbamazepine (TEGRETOL) 200 MG tablet Take 1 tablet (200 mg total) by mouth daily. 02/12/17  Yes Glean Hess, MD  Cholecalciferol (D3 HIGH POTENCY) 1000 UNITS capsule Take by mouth. 09/22/14  Yes [provider]  Coral Calcium 500 MG TABS Take by mouth.   Yes [provider]  fluticasone (FLONASE) 50 MCG/ACT nasal spray Place 2 sprays into both nostrils daily. 11/16/15  Yes Glean Hess, MD  Insulin Detemir (LEVEMIR FLEXTOUCH) 100 UNIT/ML Pen Inject 21 Units into the skin daily. 12/09/16  Yes Glean Hess, MD  metFORMIN (GLUCOPHAGE) 1000 MG tablet Take 1 tablet (1,000 mg total) by mouth 2 (two) times daily with a meal. 03/25/17  Yes Glean Hess, MD  neomycin-polymyxin b-dexamethasone (MAXITROL) 3.5-10000-0.1 SUSP Place 2 drops into both eyes every 6 (six) hours. 11/16/15  Yes  Glean Hess, MD  ONE [facility] ULTRA TEST test strip USE ONE STRIP TO CHECK GLUCOSE TWICE DAILY  12/26/16  Yes Glean Hess, MD  ramipril (ALTACE) 5 MG capsule TAKE 1 BY MOUTH DAILY 11/13/16  Yes Glean Hess, MD  valACYclovir (VALTREX) 1000 MG tablet Take 1 tablet (1,000 mg total) by mouth 3 (three) times daily. 09/01/16  Yes Glean Hess, MD  meloxicam (MOBIC) 15 MG tablet Take 1 tablet by mouth daily. 07/16/15   [provider]    Allergies  Allergen Reactions  . Pioglitazone Nausea Only    Past Surgical History:  Procedure Laterality Date  . COLOSTOMY REVERSAL  01/2014  . ILEOSTOMY  05/2013    Social History  Substance Use Topics  . Smoking status: Never Smoker  . Smokeless tobacco: Never Used  . Alcohol use No   Fall Risk  07/17/2017 02/22/2016 06/27/2015  Falls in the past year? Yes Yes No  Number falls in past yr: 1 1 -  Injury with Fall? No No -  Follow up Falls evaluation completed - -     Medication list has been reviewed and updated.  PHQ 2/9 Scores 07/17/2017 02/22/2016 06/27/2015  PHQ - 2 Score 0 0 1    Physical Exam  Constitutional: She is oriented to person, place, and time. She appears well-developed and well-nourished.  Eyes: Pupils are equal, round, and reactive to light.  Neck: Normal range of motion. Neck supple. No thyromegaly present.  Cardiovascular: Normal rate, regular rhythm and normal heart sounds.   Pulmonary/Chest: Effort normal and breath sounds normal. No respiratory distress.  Abdominal: Soft.  Musculoskeletal: She exhibits no edema or tenderness.  Neurological: She is alert and oriented to person, place, and time.    BP 110/62   Pulse (!) 47   Ht 5\' 2"  (1.575 m)   Wt 157 lb 12.8 oz (71.6 kg)   SpO2 100%   BMI 28.86 kg/m   Assessment and Plan: 1. Essential (primary) hypertension controlled  2. Uncontrolled type 2 diabetes mellitus without complication, with long-term current use of insulin ([facility]) Will change metformin to ER due to diarrhea Eye exam recently done Discussed resuming regular walking or pool  exercise - Hemoglobin A1c - Microalbumin / creatinine urine ratio - TSH - metFORMIN (GLUCOPHAGE-XR) 500 MG 24 hr tablet; Take 2 tablets (1,000 mg total) by mouth 2 (two) times daily.  Dispense: 360 tablet; Refill: 3  3. Hyperlipidemia associated with type 2 diabetes mellitus ([place]) - Lipid panel  4. Need for pneumococcal vaccination - Pneumococcal polysaccharide vaccine 23-valent greater than or equal to 2yo subcutaneous/IM   Meds ordered this encounter  Medications  . atorvastatin (LIPITOR) 40 MG tablet    Sig: Take 1 tablet (40 mg total) by mouth daily.    Dispense:  90 tablet    Refill:  3  . metFORMIN (GLUCOPHAGE-XR) 500 MG 24 hr tablet    Sig: Take 2 tablets (1,000 mg total) by mouth 2 (two) times daily.    Dispense:  360 tablet    Refill:  3    Partially dictated using Editor, commissioning. Any errors are unintentional.  Halina Maidens, MD [facility]  07/17/2017

## 2017-07-18 ENCOUNTER — Encounter: Payer: Self-pay | Admitting: Internal Medicine

## 2017-07-18 LAB — LIPID PANEL
CHOL/HDL RATIO: 2.7 ratio (ref 0.0–4.4)
CHOLESTEROL TOTAL: 197 mg/dL (ref 100–199)
HDL: 72 mg/dL (ref >39)
LDL Calculated: 106 mg/dL — ABNORMAL HIGH (ref 0–99)
TRIGLYCERIDES: 97 mg/dL (ref 0–149)
VLDL Cholesterol Cal: 19 mg/dL (ref 5–40)

## 2017-07-18 LAB — MICROALBUMIN / CREATININE URINE RATIO
Creatinine, Urine: 109.1 mg/dL
Microalbumin, Urine: <3 ug/mL

## 2017-07-18 LAB — HEMOGLOBIN A1C
Est. average glucose Bld gHb Est-mCnc: 180 mg/dL
HEMOGLOBIN A1C: 7.9 % — AB (ref 4.8–5.6)

## 2017-07-18 LAB — TSH: TSH: 1.68 u[IU]/mL (ref 0.450–4.500)

## 2017-07-22 ENCOUNTER — Encounter: Payer: Self-pay | Admitting: Internal Medicine

## 2017-08-20 ENCOUNTER — Other Ambulatory Visit: Payer: Self-pay

## 2017-08-20 DIAGNOSIS — IMO0001 Reserved for inherently not codable concepts without codable children: Secondary | ICD-10-CM

## 2017-08-20 DIAGNOSIS — E1165 Type 2 diabetes mellitus with hyperglycemia: Principal | ICD-10-CM

## 2017-08-20 DIAGNOSIS — Z794 Long term (current) use of insulin: Principal | ICD-10-CM

## 2017-08-20 MED ORDER — INSULIN DETEMIR 100 UNIT/ML FLEXPEN: 21.0000 [IU] | PEN_INJECTOR | Freq: Every day | SUBCUTANEOUS | 3 refills | Status: DC

## 2017-08-27 ENCOUNTER — Other Ambulatory Visit: Payer: Self-pay

## 2017-08-27 DIAGNOSIS — E1165 Type 2 diabetes mellitus with hyperglycemia: Principal | ICD-10-CM

## 2017-08-27 DIAGNOSIS — Z794 Long term (current) use of insulin: Principal | ICD-10-CM

## 2017-08-27 DIAGNOSIS — IMO0001 Reserved for inherently not codable concepts without codable children: Secondary | ICD-10-CM

## 2017-08-27 MED ORDER — INSULIN DETEMIR 100 UNIT/ML FLEXPEN: 21.0000 [IU] | PEN_INJECTOR | Freq: Every day | SUBCUTANEOUS | 3 refills | Status: DC

## 2017-09-21 ENCOUNTER — Ambulatory Visit (INDEPENDENT_AMBULATORY_CARE_PROVIDER_SITE_OTHER): Payer: Medicare Other

## 2017-09-21 VITALS — BP 112/40 | HR 60 | Temp 98.0°F | Resp 16 | Ht 62.0 in | Wt 160.2 lb

## 2017-09-21 DIAGNOSIS — Z1159 Encounter for screening for other viral diseases: Secondary | ICD-10-CM

## 2017-09-21 DIAGNOSIS — Z9189 Other specified personal risk factors, not elsewhere classified: Secondary | ICD-10-CM | POA: Diagnosis not present

## 2017-09-21 DIAGNOSIS — Z23 Encounter for immunization: Secondary | ICD-10-CM

## 2017-09-21 DIAGNOSIS — Z0000 Encounter for general adult medical examination without abnormal findings: Secondary | ICD-10-CM

## 2017-09-21 DIAGNOSIS — Z1231 Encounter for screening mammogram for malignant neoplasm of breast: Secondary | ICD-10-CM

## 2017-09-21 DIAGNOSIS — Z1239 Encounter for other screening for malignant neoplasm of breast: Secondary | ICD-10-CM

## 2017-09-21 NOTE — Patient Instructions (Signed)
[name] , Thank you for taking time to come for your Medicare Wellness Visit. I appreciate your ongoing commitment to your health goals. Please review the following plan we discussed and let me know if I can assist you in the future.   Screening recommendations/referrals: Colonoscopy: Please provide a copy of your colonoscopy report prior to your next office visit. Mammogram: Ordered today. Please call [facility] to schedule your mammogram.  Bone Density: Completed 04/25/14 Recommended yearly ophthalmology/optometry visit for glaucoma screening and checkup Recommended yearly dental visit for hygiene and checkup  Vaccinations: Influenza vaccine: Given today Pneumococcal vaccine: Completed series Tdap vaccine: Up to date Shingles vaccine: Declined. Please call your insurance company to determine your out of pocket expense.  Advanced directives: Advance directive discussed with you today. I have provided a copy for you to complete at home and have notarized. Once this is complete please bring a copy in to our office so we can scan it into your chart.  Conditions/risks identified: Fall risk prevention discussed  Next appointment: You are scheduled sto see Dr. Army Melia on 12/10/17@ 9:30am. Please schedule your annual wellness exam with the Nurse Health Advisor in one year.   Preventive Care 72 Years and Older, Female Preventive care refers to lifestyle choices and visits with your health care provider that can promote health and wellness. What does preventive care include?  A yearly physical exam. This is also called an annual well check.  Dental exams once or twice a year.  Routine eye exams. Ask your health care provider how often you should have your eyes checked.  Personal lifestyle choices, including:  Daily care of your teeth and gums.  Regular physical activity.  Eating a healthy diet.  Avoiding tobacco and drug use.  Limiting alcohol use.  Practicing safe sex.  Taking  low-dose aspirin every day.  Taking vitamin and mineral supplements as recommended by your health care provider. What happens during an annual well check? The services and screenings done by your health care provider during your annual well check will depend on your age, overall health, lifestyle risk factors, and family history of disease. Counseling  Your health care provider may ask you questions about your:  Alcohol use.  Tobacco use.  Drug use.  Emotional well-being.  Home and relationship well-being.  Sexual activity.  Eating habits.  History of falls.  Memory and ability to understand (cognition).  Work and work Statistician.  Reproductive health. Screening  You may have the following tests or measurements:  Height, weight, and BMI.  Blood pressure.  Lipid and cholesterol levels. These may be checked every 5 years, or more frequently if you are over 55 years old.  Skin check.  Lung cancer screening. You may have this screening every year starting at age 63 if you have a 30-pack-year history of smoking and currently smoke or have quit within the past 15 years.  Fecal occult blood test (FOBT) of the stool. You may have this test every year starting at age 56.  Flexible sigmoidoscopy or colonoscopy. You may have a sigmoidoscopy every 5 years or a colonoscopy every 10 years starting at age 76.  Hepatitis C blood test.  Hepatitis B blood test.  Sexually transmitted disease (STD) testing.  Diabetes screening. This is done by checking your blood sugar (glucose) after you have not eaten for a while (fasting). You may have this done every 1-3 years.  Bone density scan. This is done to screen for osteoporosis. You may have this done  starting at age 28.  Mammogram. This may be done every 1-2 years. Talk to your health care provider about how often you should have regular mammograms. Talk with your health care provider about your test results, treatment options, and  if necessary, the need for more tests. Vaccines  Your health care provider may recommend certain vaccines, such as:  Influenza vaccine. This is recommended every year.  Tetanus, diphtheria, and acellular pertussis (Tdap, Td) vaccine. You may need a Td booster every 10 years.  Zoster vaccine. You may need this after age 37.  Pneumococcal 13-valent conjugate (PCV13) vaccine. One dose is recommended after age 6.  Pneumococcal polysaccharide (PPSV23) vaccine. One dose is recommended after age 11. Talk to your health care provider about which screenings and vaccines you need and how often you need them. This information is not intended to replace advice given to you by your health care provider. Make sure you discuss any questions you have with your health care provider. Document Released: 11/30/2015 Document Revised: 07/23/2016 Document Reviewed: 09/04/2015 Elsevier Interactive Patient Education  2017 [place] Prevention in the Home Falls can cause injuries. They can happen to people of all ages. There are many things you can do to make your home safe and to help prevent falls. What can I do on the outside of my home?  Regularly fix the edges of walkways and driveways and fix any cracks.  Remove anything that might make you trip as you walk through a door, such as a raised step or threshold.  Trim any bushes or trees on the path to your home.  Use bright outdoor lighting.  Clear any walking paths of anything that might make someone trip, such as rocks or tools.  Regularly check to see if handrails are loose or broken. Make sure that both sides of any steps have handrails.  Any raised decks and porches should have guardrails on the edges.  Have any leaves, snow, or ice cleared regularly.  Use sand or salt on walking paths during winter.  Clean up any spills in your garage right away. This includes oil or grease spills. What can I do in the bathroom?  Use night  lights.  Install grab bars by the toilet and in the tub and shower. Do not use towel bars as grab bars.  Use non-skid mats or decals in the tub or shower.  If you need to sit down in the shower, use a plastic, non-slip stool.  Keep the floor dry. Clean up any water that spills on the floor as soon as it happens.  Remove soap buildup in the tub or shower regularly.  Attach bath mats securely with double-sided non-slip rug tape.  Do not have throw rugs and other things on the floor that can make you trip. What can I do in the bedroom?  Use night lights.  Make sure that you have a light by your bed that is easy to reach.  Do not use any sheets or blankets that are too big for your bed. They should not hang down onto the floor.  Have a firm chair that has side arms. You can use this for support while you get dressed.  Do not have throw rugs and other things on the floor that can make you trip. What can I do in the kitchen?  Clean up any spills right away.  Avoid walking on wet floors.  Keep items that you use a lot in easy-to-reach places.  If you need to reach something above you, use a strong step stool that has a grab bar.  Keep electrical cords out of the way.  Do not use floor polish or wax that makes floors slippery. If you must use wax, use non-skid floor wax.  Do not have throw rugs and other things on the floor that can make you trip. What can I do with my stairs?  Do not leave any items on the stairs.  Make sure that there are handrails on both sides of the stairs and use them. Fix handrails that are broken or loose. Make sure that handrails are as long as the stairways.  Check any carpeting to make sure that it is firmly attached to the stairs. Fix any carpet that is loose or worn.  Avoid having throw rugs at the top or bottom of the stairs. If you do have throw rugs, attach them to the floor with carpet tape.  Make sure that you have a light switch at the  top of the stairs and the bottom of the stairs. If you do not have them, ask someone to add them for you. What else can I do to help prevent falls?  Wear shoes that:  Do not have high heels.  Have rubber bottoms.  Are comfortable and fit you well.  Are closed at the toe. Do not wear sandals.  If you use a stepladder:  Make sure that it is fully opened. Do not climb a closed stepladder.  Make sure that both sides of the stepladder are locked into place.  Ask someone to hold it for you, if possible.  Clearly mark and make sure that you can see:  Any grab bars or handrails.  First and last steps.  Where the edge of each step is.  Use tools that help you move around (mobility aids) if they are needed. These include:  Canes.  Walkers.  Scooters.  Crutches.  Turn on the lights when you go into a dark area. Replace any light bulbs as soon as they burn out.  Set up your furniture so you have a clear path. Avoid moving your furniture around.  If any of your floors are uneven, fix them.  If there are any pets around you, be aware of where they are.  Review your medicines with your doctor. Some medicines can make you feel dizzy. This can increase your chance of falling. Ask your doctor what other things that you can do to help prevent falls. This information is not intended to replace advice given to you by your health care provider. Make sure you discuss any questions you have with your health care provider. Document Released: 08/30/2009 Document Revised: 04/10/2016 Document Reviewed: 12/08/2014 Elsevier Interactive Patient Education  2017 [employer].

## 2017-09-21 NOTE — Progress Notes (Signed)
Subjective:   [name] is a 71 y.o. female who presents for Medicare Annual (Subsequent) preventive examination.  Review of Systems:  N/A  Cardiac Risk Factors include: advanced age (>38men, >94 women);diabetes mellitus;dyslipidemia;hypertension;obesity (BMI >30kg/m2)     Objective:     Vitals: BP (!) 112/40 (BP Location: Right Arm, Patient Position: Sitting, Cuff Size: Normal)   Pulse 60   Temp 98 F (36.7 C) (Oral)   Resp 16   Ht 5\' 2"  (1.575 m)   Wt 160 lb 3.2 oz (72.7 kg)   BMI 29.30 kg/m   Body mass index is 29.3 kg/m.   Tobacco Social History   Tobacco Use  Smoking Status Never Smoker  Smokeless Tobacco Never Used     Counseling given: Not Answered   Past Medical History:  Diagnosis Date  . Diabetes mellitus without complication ([place])   . Hyperlipidemia   . Hypertension   . Seizures ([place])   . Vitamin D deficiency    Past Surgical History:  Procedure Laterality Date  . COLOSTOMY REVERSAL  01/2014  . ILEOSTOMY  05/2013   Family History  Problem Relation Age of Onset  . Cancer Mother   . Diabetes Daughter   . Diabetes Son    Social History   Substance and Sexual Activity  Sexual Activity No    Outpatient Encounter Medications as of 09/21/2017  Medication Sig  . aspirin 81 MG tablet Take 81 mg by mouth daily.  Marland Kitchen atorvastatin (LIPITOR) 40 MG tablet Take 1 tablet (40 mg total) by mouth daily.  . bisoprolol-hydrochlorothiazide (ZIAC) 5-6.25 MG tablet TAKE 1 BY MOUTH DAILY  . carbamazepine (TEGRETOL) 200 MG tablet Take 1 tablet (200 mg total) by mouth daily.  . Cholecalciferol (D3 HIGH POTENCY) 1000 UNITS capsule Take by mouth.  . [place] Calcium 500 MG TABS Take by mouth.  . fluticasone (FLONASE) 50 MCG/ACT nasal spray Place 2 sprays into both nostrils daily.  . Insulin Detemir (LEVEMIR FLEXTOUCH) 100 UNIT/ML Pen Inject 21 Units into the skin daily.  . meloxicam (MOBIC) 15 MG tablet Take 1 tablet by mouth daily.  . metFORMIN (GLUCOPHAGE-XR)  500 MG 24 hr tablet Take 2 tablets (1,000 mg total) by mouth 2 (two) times daily.  Marland Kitchen neomycin-polymyxin b-dexamethasone (MAXITROL) 3.5-10000-0.1 SUSP Place 2 drops into both eyes every 6 (six) hours.  . ONE TOUCH ULTRA TEST test strip USE ONE STRIP TO CHECK GLUCOSE TWICE DAILY  . ramipril (ALTACE) 5 MG capsule TAKE 1 BY MOUTH DAILY  . valACYclovir (VALTREX) 1000 MG tablet Take 1 tablet (1,000 mg total) by mouth 3 (three) times daily.   No facility-administered encounter medications on file as of 09/21/2017.     Activities of Daily Living In your present state of health, do you have any difficulty performing the following activities: 09/21/2017 07/17/2017  Hearing? N N  Vision? N N  Difficulty concentrating or making decisions? Y N  Comment occasional memory loss -  Walking or climbing stairs? N N  Dressing or bathing? N N  Doing errands, shopping? N N  Preparing Food and eating ? N -  Using the Toilet? N -  In the past six months, have you accidently leaked urine? N -  Do you have problems with loss of bowel control? Y -  Comment feels it's a side effect from her colon cancer surgery -  Managing your Medications? N -  Managing your Finances? N -  Housekeeping or managing your Housekeeping? N -  Some recent  data might be hidden    Patient Care Team: Glean Hess, MD as PCP - General (Family Medicine) Gwinda Maine, MD as Referring Physician (Oncology) [facility] (Ophthalmology) Zafar, Rondel Jumbo (Inactive) as Consulting Physician    Assessment:     Exercise Activities and Dietary recommendations Current Exercise Habits: The patient does not participate in regular exercise at present, Type of exercise: walking, Time (Minutes): 20, Frequency (Times/Week): 1, Weekly Exercise (Minutes/Week): 20, Intensity: Mild  Goals    None     Fall Risk Fall Risk  09/21/2017 07/17/2017 02/22/2016 06/27/2015  Falls in the past year? Yes Yes Yes No  Number falls in past yr: 2 or more 1  1 -  Injury with Fall? No No No -  Follow up Education provided;Falls prevention discussed Falls evaluation completed - -   Depression Screen PHQ 2/9 Scores 09/21/2017 07/17/2017 02/22/2016 06/27/2015  PHQ - 2 Score 1 0 0 1     Cognitive Function     6CIT Screen 09/21/2017  What Year? 0 points  What month? 0 points  What time? 0 points  Count back from 20 0 points  Months in reverse 0 points  Repeat phrase 2 points  Total Score 2    Immunization History  Administered Date(s) Administered  . Influenza,inj,Quad PF,6+ Mos 08/20/2015, 09/17/2016  . Pneumococcal Conjugate-13 10/22/2015  . Pneumococcal Polysaccharide-23 07/17/2017  . Tdap 12/19/2014   Screening Tests Health Maintenance  Topic Date Due  . Hepatitis C Screening  04-25-1946  . FOOT EXAM  02/21/2017  . INFLUENZA VACCINE  06/17/2017  . COLONOSCOPY  06/30/2017  . MAMMOGRAM  07/31/2017  . HEMOGLOBIN A1C  01/14/2018  . OPHTHALMOLOGY EXAM  07/17/2018  . TETANUS/TDAP  12/19/2024  . PNA vac Low Risk Adult  Completed  . DEXA SCAN  Addressed      Plan:    I have personally reviewed and addressed the Medicare Annual Wellness questionnaire and have noted the following in the patient's chart:  A. Medical and social history B. Use of alcohol, tobacco or illicit drugs  C. Current medications and supplements D. Functional ability and status E.  Nutritional status F.  Physical activity G. Advance directives H. List of other physicians I.  Hospitalizations, surgeries, and ER visits in previous 12 months J.  [place] such as hearing and vision if needed, cognitive and depression L. Referrals and appointments - none  In addition, I have reviewed and discussed with patient certain preventive protocols, quality metrics, and best practice recommendations. A written personalized care plan for preventive services as well as general preventive health recommendations were provided to patient.  See attached scanned  questionnaire for additional information.   Signed,  Aleatha Borer, LPN Nurse Health Advisor  MD Recommendations: Pt due for mammogram. Pt requesting to complete testing at [facility]. Signed orders given to pt today.  Pt due for Hep C screening. Signed orders given to pt today.  Pt states she had a colonoscopy Aug of 2018. Pt advised to provide copy of results prior to next OV.  Flu vaccine given today.

## 2017-11-11 ENCOUNTER — Other Ambulatory Visit: Payer: Self-pay | Admitting: Internal Medicine

## 2017-11-11 MED ORDER — RAMIPRIL 5 MG PO CAPS: ORAL_CAPSULE | ORAL | 3 refills | Status: DC

## 2017-11-16 DIAGNOSIS — Z1231 Encounter for screening mammogram for malignant neoplasm of breast: Secondary | ICD-10-CM | POA: Diagnosis not present

## 2017-12-02 ENCOUNTER — Other Ambulatory Visit: Payer: Self-pay | Admitting: Internal Medicine

## 2017-12-02 DIAGNOSIS — Z1159 Encounter for screening for other viral diseases: Secondary | ICD-10-CM | POA: Diagnosis not present

## 2017-12-03 LAB — HEPATITIS C ANTIBODY: HEP C VIRUS AB: 0.2 {s_co_ratio} (ref 0.0–0.9)

## 2017-12-04 NOTE — Progress Notes (Signed)
Patient informed of negative Hep C.

## 2017-12-10 ENCOUNTER — Encounter: Payer: Self-pay | Admitting: Internal Medicine

## 2017-12-10 ENCOUNTER — Ambulatory Visit (INDEPENDENT_AMBULATORY_CARE_PROVIDER_SITE_OTHER): Payer: Medicare Other | Admitting: Internal Medicine

## 2017-12-10 VITALS — BP 102/58 | HR 57 | Ht 62.0 in | Wt 158.0 lb

## 2017-12-10 DIAGNOSIS — I10 Essential (primary) hypertension: Secondary | ICD-10-CM | POA: Diagnosis not present

## 2017-12-10 DIAGNOSIS — E1165 Type 2 diabetes mellitus with hyperglycemia: Secondary | ICD-10-CM | POA: Diagnosis not present

## 2017-12-10 DIAGNOSIS — Z794 Long term (current) use of insulin: Secondary | ICD-10-CM | POA: Diagnosis not present

## 2017-12-10 DIAGNOSIS — Z85048 Personal history of other malignant neoplasm of rectum, rectosigmoid junction, and anus: Secondary | ICD-10-CM

## 2017-12-10 DIAGNOSIS — E785 Hyperlipidemia, unspecified: Secondary | ICD-10-CM

## 2017-12-10 DIAGNOSIS — M170 Bilateral primary osteoarthritis of knee: Secondary | ICD-10-CM

## 2017-12-10 DIAGNOSIS — E1169 Type 2 diabetes mellitus with other specified complication: Secondary | ICD-10-CM

## 2017-12-10 DIAGNOSIS — IMO0001 Reserved for inherently not codable concepts without codable children: Secondary | ICD-10-CM

## 2017-12-10 LAB — POCT URINALYSIS DIPSTICK
Bilirubin, UA: NEGATIVE
Glucose, UA: NEGATIVE
Ketones, UA: NEGATIVE
Leukocytes, UA: NEGATIVE
Nitrite, UA: NEGATIVE
Protein, UA: NEGATIVE
Spec Grav, UA: 1.015 (ref 1.010–1.025)
Urobilinogen, UA: 0.2 E.U./dL
pH, UA: 5 (ref 5.0–8.0)

## 2017-12-10 MED ORDER — ATORVASTATIN CALCIUM 40 MG PO TABS: 40.0000 mg | ORAL_TABLET | Freq: Every day | ORAL | 3 refills | Status: DC

## 2017-12-10 MED ORDER — INSULIN DETEMIR 100 UNIT/ML FLEXPEN: 50.0000 [IU] | PEN_INJECTOR | Freq: Every day | SUBCUTANEOUS | 3 refills | Status: DC

## 2017-12-10 MED ORDER — GLUCOSE BLOOD VI STRP: ORAL_STRIP | 12 refills | Status: DC

## 2017-12-10 NOTE — Progress Notes (Signed)
Date:  12/10/2017   Name:  [name]   DOB:  Jun 25, 1946   MRN:  [id number]   Chief Complaint: Diabetes and Hyperlipidemia Diabetes  She presents for her follow-up diabetic visit. She has type 2 diabetes mellitus. Her disease course has been stable. Pertinent negatives for hypoglycemia include no dizziness, headaches, nervousness/anxiousness or tremors. Pertinent negatives for diabetes include no chest pain, no fatigue, no polydipsia and no polyuria. Current diabetic treatment includes oral agent (monotherapy) and insulin injections. She is compliant with treatment most of the time.  Hyperlipidemia  This is a chronic problem. Pertinent negatives include no chest pain or shortness of breath. Current antihyperlipidemic treatment includes statins. The current treatment provides significant improvement of lipids.  Hypertension  This is a chronic problem. The problem is controlled. Pertinent negatives include no chest pain, headaches, palpitations or shortness of breath.    Lab Results  Component Value Date   HGBA1C 7.9 (H) 07/17/2017     Review of Systems  Constitutional: Negative for chills, fatigue and fever.  HENT: Negative for congestion, ear discharge, ear pain, hearing loss, tinnitus, trouble swallowing and voice change.   Eyes: Negative for visual disturbance.  Respiratory: Negative for cough, chest tightness, shortness of breath and wheezing.   Cardiovascular: Negative for chest pain, palpitations and leg swelling.  Gastrointestinal: Negative for abdominal pain, constipation, diarrhea and vomiting.  Endocrine: Negative for polydipsia and polyuria.  Genitourinary: Negative for dysuria, frequency, genital sores, vaginal bleeding and vaginal discharge.  Musculoskeletal: Positive for arthralgias and gait problem. Negative for joint swelling.  Skin: Negative for color change and rash.  Neurological: Negative for dizziness, tremors, light-headedness and headaches.    Hematological: Negative for adenopathy. Does not bruise/bleed easily.  Psychiatric/Behavioral: Negative for dysphoric mood and sleep disturbance. The patient is not nervous/anxious.     Patient Active Problem List   Diagnosis Date Noted  . Neoplasm of uncertain behavior of skin 01/16/2017  . 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 Uncontrolled type 2 diabetes mellitus without complication, with long-term current use of insulin ([place]) 05/08/2015  . Essential (primary) hypertension 05/08/2015  . History of rectal cancer 05/08/2015  . Hyperlipidemia associated with type 2 diabetes mellitus ([place]) 05/08/2015  . Arthritis of knee, degenerative 05/08/2015  . Cerebral seizure 05/08/2015  . Shoulder strain 05/08/2015  . Avitaminosis D 05/08/2015    Prior to Admission medications   Medication Sig Start Date End Date Taking? Authorizing Provider  aspirin 81 MG tablet Take 81 mg by mouth daily.   Yes [provider]  atorvastatin (LIPITOR) 40 MG tablet Take 1 tablet (40 mg total) by mouth daily. 07/17/17  Yes Glean Hess, MD  bisoprolol-hydrochlorothiazide [facility]) 5-6.25 MG tablet TAKE 1 BY MOUTH DAILY 02/12/17  Yes Glean Hess, MD  carbamazepine (TEGRETOL) 200 MG tablet Take 1 tablet (200 mg total) by mouth daily. 02/12/17  Yes Glean Hess, MD  Cholecalciferol (D3 HIGH POTENCY) 1000 UNITS capsule Take by mouth. 09/22/14  Yes [provider]  Coral Calcium 500 MG TABS Take by mouth.   Yes [provider]  fluticasone (FLONASE) 50 MCG/ACT nasal spray Place 2 sprays into both nostrils daily. 11/16/15  Yes Glean Hess, MD  Insulin Detemir (LEVEMIR FLEXTOUCH) 100 UNIT/ML Pen Inject 21 Units into the skin daily. 08/27/17  Yes Glean Hess, MD  meloxicam (MOBIC) 15 MG tablet Take 1 tablet by mouth daily. 07/16/15  Yes [provider]  metFORMIN (GLUCOPHAGE-XR) 500 MG 24 hr tablet Take  2 tablets (1,000 mg total) by mouth 2 (two) times daily. 07/17/17  Yes  Glean Hess, MD  neomycin-polymyxin b-dexamethasone (MAXITROL) 3.5-10000-0.1 SUSP Place 2 drops into both eyes every 6 (six) hours. 11/16/15  Yes Glean Hess, MD  ONE TOUCH ULTRA TEST test strip USE ONE STRIP TO CHECK GLUCOSE TWICE DAILY 12/26/16  Yes Glean Hess, MD  ramipril (ALTACE) 5 MG capsule TAKE 1 BY MOUTH DAILY 11/11/17  Yes Glean Hess, MD  valACYclovir (VALTREX) 1000 MG tablet Take 1 tablet (1,000 mg total) by mouth 3 (three) times daily. 09/01/16  Yes Glean Hess, MD    Allergies  Allergen Reactions  . Pioglitazone Nausea Only    Past Surgical History:  Procedure Laterality Date  . COLOSTOMY REVERSAL  01/2014  . ILEOSTOMY  05/2013    Social History   Tobacco Use  . Smoking status: Never Smoker  . Smokeless tobacco: Never Used  Substance Use Topics  . Alcohol use: No    Alcohol/week: 0.0 oz  . Drug use: No     Medication list has been reviewed and updated.  PHQ 2/9 Scores 09/21/2017 07/17/2017 02/22/2016 06/27/2015  PHQ - 2 Score 1 0 0 1    Physical Exam  Constitutional: She is oriented to person, place, and time. She appears well-developed and well-nourished. No distress.  HENT:  Head: Normocephalic and atraumatic.  Right Ear: Tympanic membrane and ear canal normal.  Left Ear: Tympanic membrane and ear canal normal.  Nose: Right sinus exhibits no maxillary sinus tenderness. Left sinus exhibits no maxillary sinus tenderness.  Mouth/Throat: Uvula is midline and oropharynx is clear and moist.  Eyes: Conjunctivae and EOM are normal. Right eye exhibits no discharge. Left eye exhibits no discharge. No scleral icterus.  Neck: Normal range of motion. Carotid bruit is not present. No erythema present. No thyromegaly present.  Cardiovascular: Normal rate, regular rhythm, normal heart sounds and normal pulses.  Pulmonary/Chest: Effort normal. No respiratory distress. She has no wheezes.  Abdominal: Soft. Bowel sounds are normal. There is no  hepatosplenomegaly. There is no tenderness. There is no CVA tenderness.  Musculoskeletal:       Right knee: She exhibits decreased range of motion. She exhibits no effusion.       Left knee: She exhibits decreased range of motion. She exhibits no effusion.  Lymphadenopathy:    She has no cervical adenopathy.    She has no axillary adenopathy.  Neurological: She is alert and oriented to person, place, and time. She has normal strength and normal reflexes. No cranial nerve deficit or sensory deficit. Gait normal.  Skin: Skin is warm, dry and intact. No rash noted.  Psychiatric: She has a normal mood and affect. Her speech is normal and behavior is normal. Thought content normal.  Nursing note and vitals reviewed.   BP (!) 102/58   Pulse (!) 57   Ht 5\' 2"  (1.575 m)   Wt 158 lb (71.7 kg)   SpO2 98%   BMI 28.90 kg/m   Assessment and Plan: 1. Uncontrolled type 2 diabetes mellitus without complication, with long-term current use of insulin (HCC) Continue medications; may need to increase insulin dose so will send new Rx - glucose blood (ONE TOUCH ULTRA TEST) test strip; Test twice a day  Dispense: 150 each; Refill: 12 - Insulin Detemir (LEVEMIR FLEXTOUCH) 100 UNIT/ML Pen; Inject 50 Units into the skin daily.  Dispense: 30 mL; Refill: 3 - Hemoglobin A1c - TSH - Microalbumin / creatinine  urine ratio - POCT Urinalysis Dipstick  2. Essential (primary) hypertension controlled - CBC with Differential/Platelet  3. Hyperlipidemia associated with type 2 diabetes mellitus ([place]) On statin therapy - atorvastatin (LIPITOR) 40 MG tablet; Take 1 tablet (40 mg total) by mouth daily.  Dispense: 90 tablet; Refill: 3 - Comprehensive metabolic panel - Lipid panel  4. Primary osteoarthritis of both knees Continue tylenol Follow up with Ortho if needed  5. History of rectal cancer Recent colonoscopy was normal   Meds ordered this encounter  Medications  . atorvastatin (LIPITOR) 40 MG tablet     Sig: Take 1 tablet (40 mg total) by mouth daily.    Dispense:  90 tablet    Refill:  3  . glucose blood (ONE TOUCH ULTRA TEST) test strip    Sig: Test twice a day    Dispense:  150 each    Refill:  12    Dx E11.9  . Insulin Detemir (LEVEMIR FLEXTOUCH) 100 UNIT/ML Pen    Sig: Inject 50 Units into the skin daily.    Dispense:  30 mL    Refill:  3    Partially dictated using Editor, commissioning. Any errors are unintentional.  Halina Maidens, MD [facility]  12/10/2017

## 2017-12-10 NOTE — Patient Instructions (Addendum)
Call Dr. Pryor Ochoa for ear wax removal   [phone]  Or [phone]  Breast Self-Awareness Breast self-awareness means being familiar with how your breasts look and feel. It involves checking your breasts regularly and reporting any changes to your health care provider. Practicing breast self-awareness is important. A change in your breasts can be a sign of a serious medical problem. Being familiar with how your breasts look and feel allows you to find any problems early, when treatment is more likely to be successful. All women should practice breast self-awareness, including women who have had breast implants. How to do a breast self-exam One way to learn what is normal for your breasts and whether your breasts are changing is to do a breast self-exam. To do a breast self-exam: Look for Changes  1. Remove all the clothing above your waist. 2. Stand in front of a mirror in a room with good lighting. 3. Put your hands on your hips. 4. Push your hands firmly downward. 5. Compare your breasts in the mirror. Look for differences between them (asymmetry), such as: ? Differences in shape. ? Differences in size. ? Puckers, dips, and bumps in one breast and not the other. 6. Look at each breast for changes in your skin, such as: ? Redness. ? Scaly areas. 7. Look for changes in your nipples, such as: ? Discharge. ? Bleeding. ? Dimpling. ? Redness. ? A change in position. Feel for Changes  Carefully feel your breasts for lumps and changes. It is best to do this while lying on your back on the floor and again while sitting or standing in the shower or tub with soapy water on your skin. Feel each breast in the following way:  Place the arm on the side of the breast you are examining above your head.  Feel your breast with the other hand.  Start in the nipple area and make  inch (2 cm) overlapping circles to feel your breast. Use the pads of your three middle fingers to do this. Apply light  pressure, then medium pressure, then firm pressure. The light pressure will allow you to feel the tissue closest to the skin. The medium pressure will allow you to feel the tissue that is a little deeper. The firm pressure will allow you to feel the tissue close to the ribs.  Continue the overlapping circles, moving downward over the breast until you feel your ribs below your breast.  Move one finger-width toward the center of the body. Continue to use the  inch (2 cm) overlapping circles to feel your breast as you move slowly up toward your collarbone.  Continue the up and down exam using all three pressures until you reach your armpit.  Write Down What You Find  Write down what is normal for each breast and any changes that you find. Keep a written record with breast changes or normal findings for each breast. By writing this information down, you do not need to depend only on memory for size, tenderness, or location. Write down where you are in your menstrual cycle, if you are still menstruating. If you are having trouble noticing differences in your breasts, do not get discouraged. With time you will become more familiar with the variations in your breasts and more comfortable with the exam. How often should I examine my breasts? Examine your breasts every month. If you are breastfeeding, the best time to examine your breasts is after a feeding or after using a breast pump. If  you menstruate, the best time to examine your breasts is 5-7 days after your period is over. During your period, your breasts are lumpier, and it may be more difficult to notice changes. When should I see my health care provider? See your health care provider if you notice:  A change in shape or size of your breasts or nipples.  A change in the skin of your breast or nipples, such as a reddened or scaly area.  Unusual discharge from your nipples.  A lump or thick area that was not there before.  Pain in your  breasts.  Anything that concerns you.  This information is not intended to replace advice given to you by your health care provider. Make sure you discuss any questions you have with your health care provider. Document Released: 11/03/2005 Document Revised: 04/10/2016 Document Reviewed: 09/23/2015 Elsevier Interactive Patient Education  [employer].

## 2017-12-11 LAB — LIPID PANEL
CHOL/HDL RATIO: 2.8 ratio (ref 0.0–4.4)
Cholesterol, Total: 201 mg/dL — ABNORMAL HIGH (ref 100–199)
HDL: 72 mg/dL (ref >39)
LDL CALC: 112 mg/dL — AB (ref 0–99)
Triglycerides: 84 mg/dL (ref 0–149)
VLDL CHOLESTEROL CAL: 17 mg/dL (ref 5–40)

## 2017-12-11 LAB — COMPREHENSIVE METABOLIC PANEL
A/G RATIO: 1.9 (ref 1.2–2.2)
ALT: 12 IU/L (ref 0–32)
AST: 13 IU/L (ref 0–40)
Albumin: 4.1 g/dL (ref 3.5–4.8)
Alkaline Phosphatase: 101 IU/L (ref 39–117)
BILIRUBIN TOTAL: 0.3 mg/dL (ref 0.0–1.2)
BUN / CREAT RATIO: 16 (ref 12–28)
BUN: 16 mg/dL (ref 8–27)
CHLORIDE: 103 mmol/L (ref 96–106)
CO2: 25 mmol/L (ref 20–29)
Calcium: 9.6 mg/dL (ref 8.7–10.3)
Creatinine, Ser: 0.98 mg/dL (ref 0.57–1.00)
GFR calc non Af Amer: 58 mL/min/{1.73_m2} — ABNORMAL LOW (ref >59)
GFR, EST AFRICAN AMERICAN: 67 mL/min/{1.73_m2} (ref >59)
GLOBULIN, TOTAL: 2.2 g/dL (ref 1.5–4.5)
Glucose: 122 mg/dL — ABNORMAL HIGH (ref 65–99)
POTASSIUM: 4.8 mmol/L (ref 3.5–5.2)
SODIUM: 144 mmol/L (ref 134–144)
Total Protein: 6.3 g/dL (ref 6.0–8.5)

## 2017-12-11 LAB — CBC WITH DIFFERENTIAL/PLATELET
BASOS: 0 %
Basophils Absolute: 0 10*3/uL (ref 0.0–0.2)
EOS (ABSOLUTE): 0.1 10*3/uL (ref 0.0–0.4)
EOS: 1 %
HEMATOCRIT: 35.1 % (ref 34.0–46.6)
Hemoglobin: 11.4 g/dL (ref 11.1–15.9)
IMMATURE GRANS (ABS): 0 10*3/uL (ref 0.0–0.1)
IMMATURE GRANULOCYTES: 0 %
LYMPHS: 13 %
Lymphocytes Absolute: 0.7 10*3/uL (ref 0.7–3.1)
MCH: 29.6 pg (ref 26.6–33.0)
MCHC: 32.5 g/dL (ref 31.5–35.7)
MCV: 91 fL (ref 79–97)
MONOS ABS: 0.6 10*3/uL (ref 0.1–0.9)
Monocytes: 10 %
NEUTROS ABS: 4.3 10*3/uL (ref 1.4–7.0)
NEUTROS PCT: 76 %
Platelets: 272 10*3/uL (ref 150–379)
RBC: 3.85 x10E6/uL (ref 3.77–5.28)
RDW: 13.9 % (ref 12.3–15.4)
WBC: 5.7 10*3/uL (ref 3.4–10.8)

## 2017-12-11 LAB — HEMOGLOBIN A1C
Est. average glucose Bld gHb Est-mCnc: 194 mg/dL
Hgb A1c MFr Bld: 8.4 % — ABNORMAL HIGH (ref 4.8–5.6)

## 2017-12-11 LAB — TSH: TSH: 1.44 u[IU]/mL (ref 0.450–4.500)

## 2017-12-31 DIAGNOSIS — H6123 Impacted cerumen, bilateral: Secondary | ICD-10-CM | POA: Diagnosis not present

## 2017-12-31 DIAGNOSIS — H6060 Unspecified chronic otitis externa, unspecified ear: Secondary | ICD-10-CM | POA: Diagnosis not present

## 2018-01-13 DIAGNOSIS — Z08 Encounter for follow-up examination after completed treatment for malignant neoplasm: Secondary | ICD-10-CM | POA: Diagnosis not present

## 2018-01-13 DIAGNOSIS — R197 Diarrhea, unspecified: Secondary | ICD-10-CM | POA: Diagnosis not present

## 2018-01-13 DIAGNOSIS — C20 Malignant neoplasm of rectum: Secondary | ICD-10-CM | POA: Diagnosis not present

## 2018-01-13 DIAGNOSIS — Z85048 Personal history of other malignant neoplasm of rectum, rectosigmoid junction, and anus: Secondary | ICD-10-CM | POA: Diagnosis not present

## 2018-01-13 DIAGNOSIS — Z79899 Other long term (current) drug therapy: Secondary | ICD-10-CM | POA: Diagnosis not present

## 2018-01-13 DIAGNOSIS — Z7982 Long term (current) use of aspirin: Secondary | ICD-10-CM | POA: Diagnosis not present

## 2018-01-13 DIAGNOSIS — K439 Ventral hernia without obstruction or gangrene: Secondary | ICD-10-CM | POA: Diagnosis not present

## 2018-02-12 ENCOUNTER — Other Ambulatory Visit: Payer: Self-pay | Admitting: Internal Medicine

## 2018-02-12 DIAGNOSIS — I10 Essential (primary) hypertension: Secondary | ICD-10-CM

## 2018-04-14 ENCOUNTER — Encounter: Payer: Self-pay | Admitting: Internal Medicine

## 2018-04-14 ENCOUNTER — Ambulatory Visit (INDEPENDENT_AMBULATORY_CARE_PROVIDER_SITE_OTHER): Payer: Medicare Other | Admitting: Internal Medicine

## 2018-04-14 VITALS — BP 138/72 | HR 54 | Temp 97.9°F | Resp 16 | Ht 62.0 in | Wt 158.8 lb

## 2018-04-14 DIAGNOSIS — E1165 Type 2 diabetes mellitus with hyperglycemia: Secondary | ICD-10-CM

## 2018-04-14 DIAGNOSIS — Z794 Long term (current) use of insulin: Secondary | ICD-10-CM | POA: Diagnosis not present

## 2018-04-14 DIAGNOSIS — M25552 Pain in left hip: Secondary | ICD-10-CM | POA: Diagnosis not present

## 2018-04-14 DIAGNOSIS — Z85048 Personal history of other malignant neoplasm of rectum, rectosigmoid junction, and anus: Secondary | ICD-10-CM

## 2018-04-14 DIAGNOSIS — I10 Essential (primary) hypertension: Secondary | ICD-10-CM

## 2018-04-14 DIAGNOSIS — G8929 Other chronic pain: Secondary | ICD-10-CM | POA: Diagnosis not present

## 2018-04-14 DIAGNOSIS — E559 Vitamin D deficiency, unspecified: Secondary | ICD-10-CM

## 2018-04-14 DIAGNOSIS — IMO0001 Reserved for inherently not codable concepts without codable children: Secondary | ICD-10-CM

## 2018-04-14 NOTE — Progress Notes (Signed)
Date:  04/14/2018   Name:  [name]   DOB:  1946-09-23   MRN:  [id number]   Chief Complaint: Hypertension and Fatigue Hypertension  This is a chronic problem. The problem is controlled. Associated symptoms include shortness of breath. Pertinent negatives include no chest pain, headaches or palpitations.  Diabetes  Pertinent negatives for hypoglycemia include no dizziness, headaches or tremors. Associated symptoms include fatigue. Pertinent negatives for diabetes include no chest pain. Symptoms are stable. She monitors blood glucose at home 3-4 x per week. Her breakfast blood glucose is taken between 6-7 am. Her breakfast blood glucose range is generally 130-140 mg/dl. Her dinner blood glucose is taken between 5-6 pm. Her dinner blood glucose range is generally 140-180 mg/dl.  Hip Pain   There was no injury mechanism. The pain is present in the left hip. The quality of the pain is described as aching. The pain is mild. She reports no foreign bodies present. She has tried NSAIDs for the symptoms.   Lab Results  Component Value Date   HGBA1C 8.4 (H) 12/10/2017     Review of Systems  Constitutional: Positive for fatigue. Negative for chills and fever.  Respiratory: Positive for shortness of breath. Negative for cough, chest tightness and wheezing.   Cardiovascular: Negative for chest pain and palpitations.  Gastrointestinal: Negative for abdominal pain, constipation and diarrhea.  Musculoskeletal: Positive for arthralgias.  Skin: Negative for rash.  Allergic/Immunologic: Negative for environmental allergies.  Neurological: Negative for dizziness, tremors, syncope, light-headedness and headaches.  Psychiatric/Behavioral: Negative for sleep disturbance.    Patient Active Problem List   Diagnosis Date Noted  . Neoplasm of uncertain behavior of skin 01/16/2017  . 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 Uncontrolled type 2 diabetes mellitus without complication, with long-term current  use of insulin ([place]) 05/08/2015  . Essential (primary) hypertension 05/08/2015  . History of rectal cancer 05/08/2015  . Hyperlipidemia associated with type 2 diabetes mellitus ([place]) 05/08/2015  . Arthritis of knee, degenerative 05/08/2015  . Cerebral seizure 05/08/2015  . Shoulder strain 05/08/2015  . Avitaminosis D 05/08/2015    Prior to Admission medications   Medication Sig Start Date End Date Taking? Authorizing Provider  aspirin 81 MG tablet Take 81 mg by mouth daily.   Yes [provider]  atorvastatin (LIPITOR) 40 MG tablet Take 1 tablet (40 mg total) by mouth daily. 12/10/17  Yes Glean Hess, MD  bisoprolol-hydrochlorothiazide [facility]) 5-6.25 MG tablet TAKE 1 TABLET BY MOUTH DAILY 02/12/18  Yes Glean Hess, MD  carbamazepine (TEGRETOL) 200 MG tablet TAKE 1 TABLET(200 MG) BY MOUTH DAILY 02/12/18  Yes Glean Hess, MD  Cholecalciferol (D3 HIGH POTENCY) 1000 UNITS capsule Take by mouth. 09/22/14  Yes [provider]  Coral Calcium 500 MG TABS Take by mouth.   Yes [provider]  fluticasone (FLONASE) 50 MCG/ACT nasal spray Place 2 sprays into both nostrils daily. 11/16/15  Yes Glean Hess, MD  glucose blood (ONE TOUCH ULTRA TEST) test strip Test twice a day 12/10/17  Yes Glean Hess, MD  meloxicam (MOBIC) 15 MG tablet Take 1 tablet by mouth daily. 07/16/15  Yes [provider]  metFORMIN (GLUCOPHAGE-XR) 500 MG 24 hr tablet Take 2 tablets (1,000 mg total) by mouth 2 (two) times daily. 07/17/17  Yes Glean Hess, MD  neomycin-polymyxin b-dexamethasone (MAXITROL) 3.5-10000-0.1 SUSP Place 2 drops into both eyes every 6 (six) hours. 11/16/15  Yes Glean Hess, MD  ramipril (ALTACE) 5 MG  capsule TAKE 1 BY MOUTH DAILY 11/11/17  Yes Glean Hess, MD  valACYclovir (VALTREX) 1000 MG tablet Take 1 tablet (1,000 mg total) by mouth 3 (three) times daily. 09/01/16  Yes Glean Hess, MD  Insulin Detemir (LEVEMIR FLEXTOUCH)  100 UNIT/ML Pen Inject 50 Units into the skin daily. 12/10/17 03/10/18  Glean Hess, MD    Allergies  Allergen Reactions  . Pioglitazone Nausea Only    Past Surgical History:  Procedure Laterality Date  . COLOSTOMY REVERSAL  01/2014  . ILEOSTOMY  05/2013    Social History   Tobacco Use  . Smoking status: Never Smoker  . Smokeless tobacco: Never Used  Substance Use Topics  . Alcohol use: No    Alcohol/week: 0.0 oz  . Drug use: No     Medication list has been reviewed and updated.  Current Meds  Medication Sig  . aspirin 81 MG tablet Take 81 mg by mouth daily.  Marland Kitchen atorvastatin (LIPITOR) 40 MG tablet Take 1 tablet (40 mg total) by mouth daily.  . bisoprolol-hydrochlorothiazide (ZIAC) 5-6.25 MG tablet TAKE 1 TABLET BY MOUTH DAILY  . carbamazepine (TEGRETOL) 200 MG tablet TAKE 1 TABLET(200 MG) BY MOUTH DAILY  . Cholecalciferol (D3 HIGH POTENCY) 1000 UNITS capsule Take by mouth.  . Coral Calcium 500 MG TABS Take by mouth.  . fluticasone (FLONASE) 50 MCG/ACT nasal spray Place 2 sprays into both nostrils daily.  Marland Kitchen glucose blood (ONE TOUCH ULTRA TEST) test strip Test twice a day  . meloxicam (MOBIC) 15 MG tablet Take 1 tablet by mouth daily.  . metFORMIN (GLUCOPHAGE-XR) 500 MG 24 hr tablet Take 2 tablets (1,000 mg total) by mouth 2 (two) times daily.  Marland Kitchen neomycin-polymyxin b-dexamethasone (MAXITROL) 3.5-10000-0.1 SUSP Place 2 drops into both eyes every 6 (six) hours.  . ramipril (ALTACE) 5 MG capsule TAKE 1 BY MOUTH DAILY  . valACYclovir (VALTREX) 1000 MG tablet Take 1 tablet (1,000 mg total) by mouth 3 (three) times daily.    PHQ 2/9 Scores 09/21/2017 07/17/2017 02/22/2016 06/27/2015  PHQ - 2 Score 1 0 0 1    Physical Exam  Constitutional: She is oriented to person, place, and time. She appears well-developed. No distress.  HENT:  Head: Normocephalic and atraumatic.  Neck: Normal range of motion.  Cardiovascular: Normal rate, regular rhythm and normal heart sounds.    Pulmonary/Chest: Effort normal and breath sounds normal. No respiratory distress.  Musculoskeletal:       Right hip: She exhibits normal range of motion and no tenderness.       Left hip: She exhibits decreased range of motion and tenderness.  Lymphadenopathy:    She has no cervical adenopathy.  Neurological: She is alert and oriented to person, place, and time. She has normal strength. No sensory deficit.  Reflex Scores:      Patellar reflexes are 2+ on the right side and 2+ on the left side. Skin: Skin is warm and dry. No rash noted.  Psychiatric: She has a normal mood and affect. Her behavior is normal. Thought content normal.  Nursing note and vitals reviewed.   BP 138/72   Pulse (!) 54   Temp 97.9 F (36.6 C) (Oral)   Resp 16   Ht 5\' 2"  (1.575 m)   Wt 158 lb 12.8 oz (72 kg)   SpO2 97%   BMI 29.04 kg/m   Assessment and Plan: 1. Uncontrolled type 2 diabetes mellitus without complication, with long-term current use of insulin ([place]) Check labs  May need to increase dose of insulin - Hemoglobin A1c  2. Essential (primary) hypertension controlled  3. Avitaminosis D Continue daily supplement  4. History of rectal cancer Now 5 years in remission  5. Chronic left hip pain Suspect OA Continue nsaids as needed Resume regular walking program and follow up if sx are worsening   No orders of the defined types were placed in this encounter.   Partially dictated using Editor, commissioning. Any errors are unintentional.  Halina Maidens, MD [facility]  04/14/2018   There are no diagnoses linked to this encounter.

## 2018-04-14 NOTE — Patient Instructions (Signed)
Health Maintenance for Postmenopausal Women Menopause is a normal process in which your reproductive ability comes to an end. This process happens gradually over a span of months to years, usually between the ages of 22 and 9. Menopause is complete when you have missed 12 consecutive menstrual periods. It is important to talk with your health care provider about some of the most common conditions that affect postmenopausal women, such as heart disease, cancer, and bone loss (osteoporosis). Adopting a healthy lifestyle and getting preventive care can help to promote your health and wellness. Those actions can also lower your chances of developing some of these common conditions. What should I know about menopause? During menopause, you may experience a number of symptoms, such as:  Moderate-to-severe hot flashes.  Night sweats.  Decrease in sex drive.  Mood swings.  Headaches.  Tiredness.  Irritability.  Memory problems.  Insomnia.  Choosing to treat or not to treat menopausal changes is an individual decision that you make with your health care provider. What should I know about hormone replacement therapy and supplements? Hormone therapy products are effective for treating symptoms that are associated with menopause, such as hot flashes and night sweats. Hormone replacement carries certain risks, especially as you become older. If you are thinking about using estrogen or estrogen with progestin treatments, discuss the benefits and risks with your health care provider. What should I know about heart disease and stroke? Heart disease, heart attack, and stroke become more likely as you age. This may be due, in part, to the hormonal changes that your body experiences during menopause. These can affect how your body processes dietary fats, triglycerides, and cholesterol. Heart attack and stroke are both medical emergencies. There are many things that you can do to help prevent heart disease  and stroke:  Have your blood pressure checked at least every 1-2 years. High blood pressure causes heart disease and increases the risk of stroke.  If you are 53-22 years old, ask your health care provider if you should take aspirin to prevent a heart attack or a stroke.  Do not use any tobacco products, including cigarettes, chewing tobacco, or electronic cigarettes. If you need help quitting, ask your health care provider.  It is important to eat a healthy diet and maintain a healthy weight. ? Be sure to include plenty of vegetables, fruits, low-fat dairy products, and lean protein. ? Avoid eating foods that are high in solid fats, added sugars, or salt (sodium).  Get regular exercise. This is one of the most important things that you can do for your health. ? Try to exercise for at least 150 minutes each week. The type of exercise that you do should increase your heart rate and make you sweat. This is known as moderate-intensity exercise. ? Try to do strengthening exercises at least twice each week. Do these in addition to the moderate-intensity exercise.  Know your numbers.Ask your health care provider to check your cholesterol and your blood glucose. Continue to have your blood tested as directed by your health care provider.  What should I know about cancer screening? There are several types of cancer. Take the following steps to reduce your risk and to catch any cancer development as early as possible. Breast Cancer  Practice breast self-awareness. ? This means understanding how your breasts normally appear and feel. ? It also means doing regular breast self-exams. Let your health care provider know about any changes, no matter how small.  If you are 40  or older, have a clinician do a breast exam (clinical breast exam or CBE) every year. Depending on your age, family history, and medical history, it may be recommended that you also have a yearly breast X-ray (mammogram).  If you  have a family history of breast cancer, talk with your health care provider about genetic screening.  If you are at high risk for breast cancer, talk with your health care provider about having an MRI and a mammogram every year.  Breast cancer (BRCA) gene test is recommended for women who have family members with BRCA-related cancers. Results of the assessment will determine the need for genetic counseling and BRCA1 and for BRCA2 testing. BRCA-related cancers include these types: ? Breast. This occurs in males or females. ? Ovarian. ? Tubal. This may also be called fallopian tube cancer. ? Cancer of the abdominal or pelvic lining (peritoneal cancer). ? Prostate. ? Pancreatic.  Cervical, Uterine, and Ovarian Cancer Your health care provider may recommend that you be screened regularly for cancer of the pelvic organs. These include your ovaries, uterus, and vagina. This screening involves a pelvic exam, which includes checking for microscopic changes to the surface of your cervix (Pap test).  For women ages 21-65, health care providers may recommend a pelvic exam and a Pap test every three years. For women ages 79-65, they may recommend the Pap test and pelvic exam, combined with testing for human papilloma virus (HPV), every five years. Some types of HPV increase your risk of cervical cancer. Testing for HPV may also be done on women of any age who have unclear Pap test results.  Other health care providers may not recommend any screening for nonpregnant women who are considered low risk for pelvic cancer and have no symptoms. Ask your health care provider if a screening pelvic exam is right for you.  If you have had past treatment for cervical cancer or a condition that could lead to cancer, you need Pap tests and screening for cancer for at least 20 years after your treatment. If Pap tests have been discontinued for you, your risk factors (such as having a new sexual partner) need to be  reassessed to determine if you should start having screenings again. Some women have medical problems that increase the chance of getting cervical cancer. In these cases, your health care provider may recommend that you have screening and Pap tests more often.  If you have a family history of uterine cancer or ovarian cancer, talk with your health care provider about genetic screening.  If you have vaginal bleeding after reaching menopause, tell your health care provider.  There are currently no reliable tests available to screen for ovarian cancer.  Lung Cancer Lung cancer screening is recommended for adults 69-62 years old who are at high risk for lung cancer because of a history of smoking. A yearly low-dose CT scan of the lungs is recommended if you:  Currently smoke.  Have a history of at least 30 pack-years of smoking and you currently smoke or have quit within the past 15 years. A pack-year is smoking an average of one pack of cigarettes per day for one year.  Yearly screening should:  Continue until it has been 15 years since you quit.  Stop if you develop a health problem that would prevent you from having lung cancer treatment.  Colorectal Cancer  This type of cancer can be detected and can often be prevented.  Routine colorectal cancer screening usually begins at  age 42 and continues through age 45.  If you have risk factors for colon cancer, your health care provider may recommend that you be screened at an earlier age.  If you have a family history of colorectal cancer, talk with your health care provider about genetic screening.  Your health care provider may also recommend using home test kits to check for hidden blood in your stool.  A small camera at the end of a tube can be used to examine your colon directly (sigmoidoscopy or colonoscopy). This is done to check for the earliest forms of colorectal cancer.  Direct examination of the colon should be repeated every  5-10 years until age 71. However, if early forms of precancerous polyps or small growths are found or if you have a family history or genetic risk for colorectal cancer, you may need to be screened more often.  Skin Cancer  Check your skin from head to toe regularly.  Monitor any moles. Be sure to tell your health care provider: ? About any new moles or changes in moles, especially if there is a change in a mole's shape or color. ? If you have a mole that is larger than the size of a pencil eraser.  If any of your family members has a history of skin cancer, especially at a young age, talk with your health care provider about genetic screening.  Always use sunscreen. Apply sunscreen liberally and repeatedly throughout the day.  Whenever you are outside, protect yourself by wearing long sleeves, pants, a wide-brimmed hat, and sunglasses.  What should I know about osteoporosis? Osteoporosis is a condition in which bone destruction happens more quickly than new bone creation. After menopause, you may be at an increased risk for osteoporosis. To help prevent osteoporosis or the bone fractures that can happen because of osteoporosis, the following is recommended:  If you are 46-71 years old, get at least 1,000 mg of calcium and at least 600 mg of vitamin D per day.  If you are older than age 55 but younger than age 65, get at least 1,200 mg of calcium and at least 600 mg of vitamin D per day.  If you are older than age 54, get at least 1,200 mg of calcium and at least 800 mg of vitamin D per day.  Smoking and excessive alcohol intake increase the risk of osteoporosis. Eat foods that are rich in calcium and vitamin D, and do weight-bearing exercises several times each week as directed by your health care provider. What should I know about how menopause affects my mental health? Depression may occur at any age, but it is more common as you become older. Common symptoms of depression  include:  Low or sad mood.  Changes in sleep patterns.  Changes in appetite or eating patterns.  Feeling an overall lack of motivation or enjoyment of activities that you previously enjoyed.  Frequent crying spells.  Talk with your health care provider if you think that you are experiencing depression. What should I know about immunizations? It is important that you get and maintain your immunizations. These include:  Tetanus, diphtheria, and pertussis (Tdap) booster vaccine.  Influenza every year before the flu season begins.  Pneumonia vaccine.  Shingles vaccine.  Your health care provider may also recommend other immunizations. This information is not intended to replace advice given to you by your health care provider. Make sure you discuss any questions you have with your health care provider. Document Released: 12/26/2005  Document Revised: 05/23/2016 Document Reviewed: 08/07/2015 Elsevier Interactive Patient Education  2018 [employer]  

## 2018-04-15 LAB — HEMOGLOBIN A1C
Est. average glucose Bld gHb Est-mCnc: 183 mg/dL
Hgb A1c MFr Bld: 8 % — ABNORMAL HIGH (ref 4.8–5.6)

## 2018-04-19 ENCOUNTER — Ambulatory Visit (INDEPENDENT_AMBULATORY_CARE_PROVIDER_SITE_OTHER)
Admission: EM | Admit: 2018-04-19 | Discharge: 2018-04-19 | Disposition: A | Discharge status: Other Acute Inpatient Hospital | Payer: Medicare Other | Source: Home / Self Care | Attending: Family Medicine | Admitting: Family Medicine

## 2018-04-19 ENCOUNTER — Emergency Department: Payer: Medicare Other

## 2018-04-19 ENCOUNTER — Other Ambulatory Visit: Payer: Self-pay

## 2018-04-19 ENCOUNTER — Observation Stay
Admission: EM | Admit: 2018-04-19 | Discharge: 2018-04-20 | Disposition: A | Discharge status: Home / Self Care | Payer: Medicare Other | Attending: Surgery | Admitting: Surgery

## 2018-04-19 DIAGNOSIS — K439 Ventral hernia without obstruction or gangrene: Secondary | ICD-10-CM | POA: Diagnosis not present

## 2018-04-19 DIAGNOSIS — Z809 Family history of malignant neoplasm, unspecified: Secondary | ICD-10-CM | POA: Diagnosis not present

## 2018-04-19 DIAGNOSIS — E559 Vitamin D deficiency, unspecified: Secondary | ICD-10-CM | POA: Diagnosis not present

## 2018-04-19 DIAGNOSIS — M549 Dorsalgia, unspecified: Secondary | ICD-10-CM

## 2018-04-19 DIAGNOSIS — Z833 Family history of diabetes mellitus: Secondary | ICD-10-CM | POA: Insufficient documentation

## 2018-04-19 DIAGNOSIS — Z794 Long term (current) use of insulin: Secondary | ICD-10-CM | POA: Diagnosis not present

## 2018-04-19 DIAGNOSIS — I10 Essential (primary) hypertension: Secondary | ICD-10-CM | POA: Diagnosis not present

## 2018-04-19 DIAGNOSIS — K760 Fatty (change of) liver, not elsewhere classified: Secondary | ICD-10-CM | POA: Diagnosis not present

## 2018-04-19 DIAGNOSIS — K56609 Unspecified intestinal obstruction, unspecified as to partial versus complete obstruction: Secondary | ICD-10-CM | POA: Diagnosis not present

## 2018-04-19 DIAGNOSIS — Z85048 Personal history of other malignant neoplasm of rectum, rectosigmoid junction, and anus: Secondary | ICD-10-CM | POA: Diagnosis not present

## 2018-04-19 DIAGNOSIS — M25552 Pain in left hip: Secondary | ICD-10-CM | POA: Insufficient documentation

## 2018-04-19 DIAGNOSIS — K429 Umbilical hernia without obstruction or gangrene: Secondary | ICD-10-CM | POA: Diagnosis not present

## 2018-04-19 DIAGNOSIS — K8020 Calculus of gallbladder without cholecystitis without obstruction: Secondary | ICD-10-CM | POA: Insufficient documentation

## 2018-04-19 DIAGNOSIS — K420 Umbilical hernia with obstruction, without gangrene: Secondary | ICD-10-CM | POA: Diagnosis not present

## 2018-04-19 DIAGNOSIS — I161 Hypertensive emergency: Secondary | ICD-10-CM

## 2018-04-19 DIAGNOSIS — D259 Leiomyoma of uterus, unspecified: Secondary | ICD-10-CM | POA: Diagnosis not present

## 2018-04-19 DIAGNOSIS — R1031 Right lower quadrant pain: Secondary | ICD-10-CM

## 2018-04-19 DIAGNOSIS — Z79899 Other long term (current) drug therapy: Secondary | ICD-10-CM | POA: Diagnosis not present

## 2018-04-19 DIAGNOSIS — Z888 Allergy status to other drugs, medicaments and biological substances status: Secondary | ICD-10-CM | POA: Insufficient documentation

## 2018-04-19 DIAGNOSIS — E7800 Pure hypercholesterolemia, unspecified: Secondary | ICD-10-CM | POA: Diagnosis not present

## 2018-04-19 DIAGNOSIS — G8929 Other chronic pain: Secondary | ICD-10-CM | POA: Diagnosis not present

## 2018-04-19 DIAGNOSIS — E119 Type 2 diabetes mellitus without complications: Secondary | ICD-10-CM | POA: Insufficient documentation

## 2018-04-19 DIAGNOSIS — R109 Unspecified abdominal pain: Secondary | ICD-10-CM

## 2018-04-19 DIAGNOSIS — I700 Atherosclerosis of aorta: Secondary | ICD-10-CM | POA: Insufficient documentation

## 2018-04-19 DIAGNOSIS — E785 Hyperlipidemia, unspecified: Secondary | ICD-10-CM | POA: Insufficient documentation

## 2018-04-19 DIAGNOSIS — Z7982 Long term (current) use of aspirin: Secondary | ICD-10-CM | POA: Insufficient documentation

## 2018-04-19 DIAGNOSIS — Z136 Encounter for screening for cardiovascular disorders: Secondary | ICD-10-CM | POA: Diagnosis not present

## 2018-04-19 LAB — URINALYSIS, COMPLETE (UACMP) WITH MICROSCOPIC
Bacteria, UA: NONE SEEN
Bilirubin Urine: NEGATIVE
GLUCOSE, UA: 50 mg/dL — AB
Ketones, ur: 5 mg/dL — AB
LEUKOCYTES UA: NEGATIVE
Nitrite: NEGATIVE
PH: 6 (ref 5.0–8.0)
Protein, ur: NEGATIVE mg/dL
SPECIFIC GRAVITY, URINE: 1.024 (ref 1.005–1.030)

## 2018-04-19 LAB — COMPREHENSIVE METABOLIC PANEL
ALT: 11 U/L — ABNORMAL LOW (ref 14–54)
AST: 16 U/L (ref 15–41)
Albumin: 3.9 g/dL (ref 3.5–5.0)
Alkaline Phosphatase: 110 U/L (ref 38–126)
Anion gap: 11 (ref 5–15)
BUN: 16 mg/dL (ref 6–20)
CHLORIDE: 101 mmol/L (ref 101–111)
CO2: 24 mmol/L (ref 22–32)
CREATININE: 0.98 mg/dL (ref 0.44–1.00)
Calcium: 9.3 mg/dL (ref 8.9–10.3)
GFR, EST NON AFRICAN AMERICAN: 56 mL/min — AB (ref >60)
Glucose, Bld: 204 mg/dL — ABNORMAL HIGH (ref 65–99)
Potassium: 4.1 mmol/L (ref 3.5–5.1)
Sodium: 136 mmol/L (ref 135–145)
Total Bilirubin: 0.5 mg/dL (ref 0.3–1.2)
Total Protein: 6.8 g/dL (ref 6.5–8.1)

## 2018-04-19 LAB — CBC
HCT: 36.4 % (ref 35.0–47.0)
Hemoglobin: 12.2 g/dL (ref 12.0–16.0)
MCH: 30.6 pg (ref 26.0–34.0)
MCHC: 33.5 g/dL (ref 32.0–36.0)
MCV: 91.6 fL (ref 80.0–100.0)
PLATELETS: 262 10*3/uL (ref 150–440)
RBC: 3.98 MIL/uL (ref 3.80–5.20)
RDW: 14.2 % (ref 11.5–14.5)
WBC: 7.2 10*3/uL (ref 3.6–11.0)

## 2018-04-19 LAB — TROPONIN I: Troponin I: <0.03 ng/mL (ref <0.03)

## 2018-04-19 LAB — LIPASE, BLOOD: LIPASE: 24 U/L (ref 11–51)

## 2018-04-19 MED ORDER — CLONIDINE HCL 0.1 MG PO TABS
0.1000 mg | ORAL_TABLET | Freq: Once | ORAL | Status: AC
  Administered 2018-04-19: 0.1 mg via ORAL

## 2018-04-19 MED ORDER — IOPAMIDOL (ISOVUE-300) INJECTION 61%
100.0000 mL | Freq: Once | INTRAVENOUS | Status: AC | PRN
  Administered 2018-04-19: 100 mL via INTRAVENOUS

## 2018-04-19 MED ORDER — SODIUM CHLORIDE 0.9 % IV BOLUS
1000.0000 mL | Freq: Once | INTRAVENOUS | Status: AC
  Administered 2018-04-19: 1000 mL via INTRAVENOUS

## 2018-04-19 MED ORDER — GLUCOSE BLOOD VI STRP: ORAL_STRIP | 2 refills | Status: DC

## 2018-04-19 MED ORDER — ONDANSETRON HCL 4 MG/2ML IJ SOLN
4.0000 mg | Freq: Once | INTRAMUSCULAR | Status: AC
  Administered 2018-04-19: 4 mg via INTRAVENOUS

## 2018-04-19 MED ORDER — MORPHINE SULFATE (PF) 4 MG/ML IV SOLN
4.0000 mg | Freq: Once | INTRAVENOUS | Status: AC
Start: 2018-04-19 — End: 2018-04-19
  Administered 2018-04-19: 4 mg via INTRAVENOUS

## 2018-04-19 MED ORDER — MORPHINE SULFATE (PF) 4 MG/ML IV SOLN
4.0000 mg | Freq: Once | INTRAVENOUS | Status: AC
  Administered 2018-04-19: 4 mg via INTRAVENOUS
  Filled 2018-04-19: qty 1

## 2018-04-19 MED ORDER — ONDANSETRON HCL 4 MG/2ML IJ SOLN
INTRAMUSCULAR | Status: AC
  Administered 2018-04-19: 4 mg via INTRAVENOUS
  Filled 2018-04-19: qty 2

## 2018-04-19 MED ORDER — ONDANSETRON HCL 4 MG/2ML IJ SOLN
4.0000 mg | Freq: Once | INTRAMUSCULAR | Status: AC
  Administered 2018-04-19: 4 mg via INTRAVENOUS
  Filled 2018-04-19: qty 2

## 2018-04-19 MED ORDER — MORPHINE SULFATE (PF) 4 MG/ML IV SOLN
INTRAVENOUS | Status: AC
  Filled 2018-04-19: qty 1

## 2018-04-19 NOTE — ED Notes (Signed)
Patient c/o RZN abdominal pain radiating to back and nausea. Patient reports pellet-type bowel movement this am.

## 2018-04-19 NOTE — ED Triage Notes (Signed)
Patient complains of lower abdominal pain, lower back pain that started suddenly at 12pm. Patient states that she has noticed some nausea that comes and goes. Patient states that the pain has been worsening.

## 2018-04-19 NOTE — ED Notes (Signed)
ED Provider at bedside. 

## 2018-04-19 NOTE — ED Provider Notes (Addendum)
-----------------------------------------   9:57 PM on 04/19/2018 -----------------------------------------  To me at 830, patient with abdominal pain and vomiting, CT pending.  CT scan shows SBO with transition point likely in the umbilicus.  I did give the patient pain medication and attempted reduction.  Like this is soft, does not appear to be inflamed, not indurated, and appears to me given obesity which limits my ability, that I can reduce what feels like the contents of the umbilical hernia.  However, patient does have ongoing discomfort, we have consulted surgery, appreciate Dr. Shann Medal consult.  He will evaluate the patient she likely will need admission.   ----------------------------------------- 12:06 AM on 04/20/2018 -----------------------------------------  Patient in no acute distress, feels much better after a reduction, surgery did see them and feels that this is a reduced hernia whether that was the actual cause of the obstruction or that there is scar tissue near the umbilicus is not clear it is a widely patent hernia at to the extent that I can determine.  Is admitted to the surgical service.   Schuyler Amor, MD 04/19/18 [id number]    Schuyler Amor, MD 04/20/18 [phone]

## 2018-04-19 NOTE — ED Notes (Signed)
Patient transported to CT 

## 2018-04-19 NOTE — ED Provider Notes (Signed)
[facility] Emergency Department Provider Note  ___________________________________________   First MD Initiated Contact with Patient 04/19/18 1938     (approximate)  I have reviewed the triage vital signs and the nursing notes.   HISTORY  Chief Complaint Abdominal Pain  HPI [name] is a 72 y.o. female with a history of diabetes as well as hypertension was presenting with right lower quadrant abdominal pain.  She says the pain is a 10 out of 10 and sharp at this time and radiating through her back.  She says that she thought the pain was gas but she has been moving her bowels normally for her.  She is a history of a colostomy with reversal status post rectal cancer.  Was sent to the emergency department from urgent care for severity of the right lower quadrant pain as well as hypertension.  Given 1 dose of Catapres at urgent care prior to arrival.  Past Medical History:  Diagnosis Date  . Diabetes mellitus without complication ([place])   . Hyperlipidemia   . Hypertension   . Seizures ([place])   . Vitamin D deficiency     Patient Active Problem List   Diagnosis Date Noted  . Chronic left hip pain 04/14/2018  . Neoplasm of uncertain behavior of skin 01/16/2017  . 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 Uncontrolled type 2 diabetes mellitus without complication, with long-term current use of insulin ([place]) 05/08/2015  . Essential (primary) hypertension 05/08/2015  . History of rectal cancer 05/08/2015  . Hyperlipidemia associated with type 2 diabetes mellitus ([place]) 05/08/2015  . Arthritis of knee, degenerative 05/08/2015  . Cerebral seizure 05/08/2015  . Shoulder strain 05/08/2015  . Avitaminosis D 05/08/2015    Past Surgical History:  Procedure Laterality Date  . COLOSTOMY REVERSAL  01/2014  . ILEOSTOMY  05/2013    Prior to Admission medications   Medication Sig Start Date End Date Taking? Authorizing Provider  aspirin 81 MG tablet Take 81 mg by mouth  daily.    [provider]  atorvastatin (LIPITOR) 40 MG tablet Take 1 tablet (40 mg total) by mouth daily. 12/10/17   Glean Hess, MD  bisoprolol-hydrochlorothiazide [facility]) 5-6.25 MG tablet TAKE 1 TABLET BY MOUTH DAILY 02/12/18   Glean Hess, MD  carbamazepine (TEGRETOL) 200 MG tablet TAKE 1 TABLET(200 MG) BY MOUTH DAILY 02/12/18   Glean Hess, MD  Cholecalciferol (D3 HIGH POTENCY) 1000 UNITS capsule Take by mouth. 09/22/14   [provider]  Coral Calcium 500 MG TABS Take by mouth.    [provider]  fluticasone (FLONASE) 50 MCG/ACT nasal spray Place 2 sprays into both nostrils daily. 11/16/15   Glean Hess, MD  glucose blood (ONE TOUCH ULTRA TEST) test strip Test twice a day 04/19/18   Glean Hess, MD  Insulin Detemir (LEVEMIR FLEXTOUCH) 100 UNIT/ML Pen Inject 50 Units into the skin daily. 12/10/17 03/10/18  Glean Hess, MD  meloxicam (MOBIC) 15 MG tablet Take 1 tablet by mouth daily. 07/16/15   [provider]  metFORMIN (GLUCOPHAGE-XR) 500 MG 24 hr tablet Take 2 tablets (1,000 mg total) by mouth 2 (two) times daily. 07/17/17   Glean Hess, MD  neomycin-polymyxin b-dexamethasone (MAXITROL) 3.5-10000-0.1 SUSP Place 2 drops into both eyes every 6 (six) hours. 11/16/15   Glean Hess, MD  ramipril (ALTACE) 5 MG capsule TAKE 1 BY MOUTH DAILY 11/11/17   Glean Hess, MD  valACYclovir (VALTREX) 1000 MG tablet Take 1 tablet (  1,000 mg total) by mouth 3 (three) times daily. 09/01/16   Glean Hess, MD    Allergies Pioglitazone  Family History  Problem Relation Age of Onset  . Cancer Mother   . Diabetes Daughter   . Diabetes Son     Social History Social History   Tobacco Use  . Smoking status: Never Smoker  . Smokeless tobacco: Never Used  Substance Use Topics  . Alcohol use: No    Alcohol/week: 0.0 oz  . Drug use: No    Review of Systems  Constitutional: No fever/chills Eyes: No visual  changes. ENT: No sore throat. Cardiovascular: Denies chest pain. Respiratory: Denies shortness of breath. Gastrointestinal:  No diarrhea.  No constipation. Genitourinary: Negative for dysuria. Musculoskeletal: As above Skin: Negative for rash. Neurological: Negative for headaches, focal weakness or numbness.   ____________________________________________   PHYSICAL EXAM:  VITAL SIGNS: ED Triage Vitals  Enc Vitals Group     BP 04/19/18 1933 (!) 187/58     Pulse Rate 04/19/18 1933 (!) 57     Resp 04/19/18 1933 19     Temp 04/19/18 1933 97.9 F (36.6 C)     Temp src --      SpO2 04/19/18 1933 99 %     Weight 04/19/18 1936 158 lb (71.7 kg)     Height --      Head Circumference --      Peak Flow --      Pain Score 04/19/18 1936 10     Pain Loc --      Pain Edu? --      Excl. in [place]? --     Constitutional: Alert and oriented.  Patient appears uncomfortable.  Holding the right lower quadrant of her abdomen with her hand. Eyes: Conjunctivae are normal.  Head: Atraumatic. Nose: No congestion/rhinnorhea. Mouth/Throat: Mucous membranes are moist.  Neck: No stridor.   Cardiovascular: Normal rate, regular rhythm. Grossly normal heart sounds.   Respiratory: Normal respiratory effort.  No retractions. Lungs CTAB. Gastrointestinal: Soft with moderate right lower quadrant tenderness to palpation without rebound.  Also with right CVA tenderness to palpation which is moderate. No distention.  Musculoskeletal: No lower extremity tenderness nor edema.  No joint effusions. Neurologic:  Normal speech and language. No gross focal neurologic deficits are appreciated. Skin:  Skin is warm, dry and intact. No rash noted. Psychiatric: Mood and affect are normal. Speech and behavior are normal.  ____________________________________________   LABS (all labs ordered are listed, but only abnormal results are displayed)  Labs Reviewed  COMPREHENSIVE METABOLIC PANEL - Abnormal; Notable for the  following components:      Result Value   Glucose, Bld 204 (*)    ALT 11 (*)    GFR calc non Af Amer 56 (*)    All other components within normal limits  LIPASE, BLOOD  CBC  URINALYSIS, COMPLETE (UACMP) WITH MICROSCOPIC  TROPONIN I   ____________________________________________  EKG  ED ECG REPORT I, Doran Stabler, the attending physician, personally viewed and interpreted this ECG.   Date: 04/19/2018  EKG Time: 1938  Rate: 51  Rhythm: normal sinus rhythm  Axis: Normal  Intervals:none  ST&T Change: Machine read as minimal ST depression in the lateral leads.  Appears very minimal at most. No significant change from previous of May 13, 2006. ____________________________________________  RADIOLOGY  Pending CT abdomen. ____________________________________________   PROCEDURES  Procedure(s) performed:   Procedures  Critical Care performed:   ____________________________________________   INITIAL IMPRESSION /  ASSESSMENT AND PLAN / ED COURSE  Pertinent labs & imaging results that were available during my care of the patient were reviewed by me and considered in my medical decision making (see chart for details).  Differential diagnosis includes, but is not limited to, ovarian cyst, ovarian torsion, acute appendicitis, diverticulitis, urinary tract infection/pyelonephritis, endometriosis, bowel obstruction, colitis, renal colic, gastroenteritis, hernia, fibroids, endometriosis, pregnancy related pain including ectopic pregnancy, etc. As part of my medical decision making, I reviewed the following data within the electronic MEDICAL RECORD NUMBER Notes from prior ED visits  ----------------------------------------- 8:48 PM on 04/19/2018 -----------------------------------------  Patient pending remainder of lab work as well as CT imaging of the abdomen and pelvis.  Signed out to Dr. Burlene Arnt. ____________________________________________   FINAL CLINICAL IMPRESSION(S)  / ED DIAGNOSES  Right lower quadrant abdominal pain.    NEW MEDICATIONS STARTED DURING THIS VISIT:  New Prescriptions   No medications on file     Note:  This document was prepared using Dragon voice recognition software and may include unintentional dictation errors.     Orbie Pyo, MD 04/19/18 2049

## 2018-04-19 NOTE — ED Notes (Signed)
Glycerin swabs given

## 2018-04-19 NOTE — ED Triage Notes (Signed)
Patient coming [facility] from [facility] urgent care for right-sided abdominal pain and radiating to back, and nausea  Patient has hx of hypertension, diabetes. Patient reports she has had part of her rectum removed.

## 2018-04-19 NOTE — ED Provider Notes (Signed)
[facility]    CSN: [id number] Arrival date & time: 04/19/18  1736     History   Chief Complaint Chief Complaint  Patient presents with  . Abdominal Pain    HPI [name] is a 72 y.o. female.   72 yo female with a h/o multiple chronic medical including diabetes mellitus, hypertension, hypercholesterolemia, present with a c/o sudden onset of severe abdominal pain radiating to the back. Denies any chest pains, shortness of breath, vomiting, diarrhea, constipation.    The history is provided by the patient.  Abdominal Pain    Past Medical History:  Diagnosis Date  . Diabetes mellitus without complication ([place])   . Hyperlipidemia   . Hypertension   . Seizures ([place])   . Vitamin D deficiency     Patient Active Problem List   Diagnosis Date Noted  . Chronic left hip pain 04/14/2018  . Neoplasm of uncertain behavior of skin 01/16/2017  . 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 Uncontrolled type 2 diabetes mellitus without complication, with long-term current use of insulin ([place]) 05/08/2015  . Essential (primary) hypertension 05/08/2015  . History of rectal cancer 05/08/2015  . Hyperlipidemia associated with type 2 diabetes mellitus ([place]) 05/08/2015  . Arthritis of knee, degenerative 05/08/2015  . Cerebral seizure 05/08/2015  . Shoulder strain 05/08/2015  . Avitaminosis D 05/08/2015    Past Surgical History:  Procedure Laterality Date  . COLOSTOMY REVERSAL  01/2014  . ILEOSTOMY  05/2013    OB History   None      Home Medications    Prior to Admission medications   Medication Sig Start Date End Date Taking? Authorizing Provider  aspirin 81 MG tablet Take 81 mg by mouth daily.   Yes [provider]  atorvastatin (LIPITOR) 40 MG tablet Take 1 tablet (40 mg total) by mouth daily. 12/10/17  Yes Glean Hess, MD  bisoprolol-hydrochlorothiazide [facility]) 5-6.25 MG tablet TAKE 1 TABLET BY MOUTH DAILY 02/12/18  Yes Glean Hess, MD    carbamazepine (TEGRETOL) 200 MG tablet TAKE 1 TABLET(200 MG) BY MOUTH DAILY 02/12/18  Yes Glean Hess, MD  Cholecalciferol (D3 HIGH POTENCY) 1000 UNITS capsule Take by mouth. 09/22/14  Yes [provider]  Coral Calcium 500 MG TABS Take by mouth.   Yes [provider]  fluticasone (FLONASE) 50 MCG/ACT nasal spray Place 2 sprays into both nostrils daily. 11/16/15  Yes Glean Hess, MD  glucose blood (ONE TOUCH ULTRA TEST) test strip Test twice a day 04/19/18  Yes Glean Hess, MD  meloxicam (MOBIC) 15 MG tablet Take 1 tablet by mouth daily. 07/16/15  Yes [provider]  metFORMIN (GLUCOPHAGE-XR) 500 MG 24 hr tablet Take 2 tablets (1,000 mg total) by mouth 2 (two) times daily. 07/17/17  Yes Glean Hess, MD  neomycin-polymyxin b-dexamethasone (MAXITROL) 3.5-10000-0.1 SUSP Place 2 drops into both eyes every 6 (six) hours. 11/16/15  Yes Glean Hess, MD  ramipril (ALTACE) 5 MG capsule TAKE 1 BY MOUTH DAILY 11/11/17  Yes Glean Hess, MD  valACYclovir (VALTREX) 1000 MG tablet Take 1 tablet (1,000 mg total) by mouth 3 (three) times daily. 09/01/16  Yes Glean Hess, MD  Insulin Detemir (LEVEMIR FLEXTOUCH) 100 UNIT/ML Pen Inject 50 Units into the skin daily. 12/10/17 03/10/18  Glean Hess, MD    Family History Family History  Problem Relation Age of Onset  . Cancer Mother   . Diabetes Daughter   . Diabetes Son  Social History Social History   Tobacco Use  . Smoking status: Never Smoker  . Smokeless tobacco: Never Used  Substance Use Topics  . Alcohol use: No    Alcohol/week: 0.0 oz  . Drug use: No     Allergies   Pioglitazone   Review of Systems Review of Systems  Gastrointestinal: Positive for abdominal pain.     Physical Exam Triage Vital Signs ED Triage Vitals  Enc Vitals Group     BP 04/19/18 1747 (!) 212/58     Pulse Rate 04/19/18 1747 (!) 55     Resp 04/19/18 1747 18     Temp 04/19/18 1747 98.1 F  (36.7 C)     Temp Source 04/19/18 1747 Oral     SpO2 04/19/18 1747 100 %     Weight 04/19/18 1745 158 lb (71.7 kg)     Height 04/19/18 1745 5\' 2"  (1.575 m)     Head Circumference --      Peak Flow --      Pain Score 04/19/18 1745 10     Pain Loc --      Pain Edu? --      Excl. in [place]? --    No data found.  Updated Vital Signs BP (!) 222/80 (BP Location: Left Arm)   Pulse (!) 55   Temp 98.1 F (36.7 C) (Oral)   Resp 18   Ht 5\' 2"  (1.575 m)   Wt 158 lb (71.7 kg)   SpO2 100%   BMI 28.90 kg/m   Visual Acuity Right Eye Distance:   Left Eye Distance:   Bilateral Distance:    Right Eye Near:   Left Eye Near:    Bilateral Near:     Physical Exam  Constitutional: She appears well-developed and well-nourished.  Non-toxic appearance. She does not appear ill. No distress.  Cardiovascular: Regular rhythm, normal heart sounds and intact distal pulses.  Pulmonary/Chest: Effort normal and breath sounds normal. No stridor. No respiratory distress. She has no wheezes. She has no rales.  Abdominal: Soft. Normal appearance and bowel sounds are normal. She exhibits no distension and no mass. There is tenderness (mid lower abdomen). There is no rebound and no guarding. No hernia.  Skin: She is not diaphoretic.  Nursing note and vitals reviewed.    UC Treatments / Results  Labs (all labs ordered are listed, but only abnormal results are displayed) Labs Reviewed - No data to display  EKG None  Radiology No results found.  Procedures Procedures (including critical care time)  Medications Ordered in UC Medications  cloNIDine (CATAPRES) tablet 0.1 mg (0.1 mg Oral Given 04/19/18 1845)    Initial Impression / Assessment and Plan / UC Course  I have reviewed the triage vital signs and the nursing notes.  Pertinent labs & imaging results that were available during my care of the patient were reviewed by me and considered in my medical decision making (see chart for details).       Final Clinical Impressions(s) / UC Diagnoses   Final diagnoses:  Hypertensive emergency  Sudden onset of severe abdominal pain  Severe back pain   Discharge Instructions   None    ED Prescriptions    None     1. ekg results and diagnosis reviewed with patient; due to patient's severe hypertension as multiple risk factors and acute abdominal/back pain symptoms, recommend patient go to Emergency Department for further evaluation and management. Patient given clonidine 0.1mg  po x1. Saline lock IV  placed. Report called to [facility] ED charge RN.    Controlled Substance Prescriptions [place] Controlled Substance Registry consulted? Not Applicable   Norval Gable, MD 04/19/18 Drema Halon

## 2018-04-19 NOTE — H&P (Addendum)
SURGICAL HISTORY & PHYSICAL (cpt [phone])  HISTORY OF PRESENT ILLNESS (HPI):  72 y.o. female presented to [facility] ED tonight for abdominal pain. Patient reports she was feeling well and passing both flatus and BM this morning when she began developing upper abdominal pain this afternoon. 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 She received one dose of morphine with persistent pain requiring a second dose of morphine 2 hours later. After this second dose, reduction of her hernia was attempted by ED physician, since which patient denies any further abdominal pain or nausea x just over 2 hours. Patient has not yet since, however, passed flatus. Patient describes herself as "out of shape" and says she can walk a block or two, but needs to stop halfway walking up a flight of steps for feeling "winded". She currently denies abdominal pain, nausea, fever/chills, CP, or SOB.  PAST MEDICAL HISTORY (PMH):  Past Medical History:  Diagnosis Date  . Cancer ([place])   . Diabetes mellitus without complication ([place])   . Hyperlipidemia   . Hypertension   . Seizures ([place])   . Vitamin D deficiency     Reviewed. Otherwise negative.   PAST SURGICAL HISTORY ([place]):  Past Surgical History:  Procedure Laterality Date  . COLOSTOMY REVERSAL  01/2014  . ILEOSTOMY  05/2013    Reviewed. Otherwise negative.   MEDICATIONS:  Prior to Admission medications   Medication Sig Start Date End Date Taking? Authorizing Provider  aspirin 81 MG tablet Take 81 mg by mouth daily.   Yes [provider]  atorvastatin (LIPITOR) 40 MG tablet Take 1 tablet (40 mg total) by mouth daily. 12/10/17  Yes Glean Hess, MD  bisoprolol-hydrochlorothiazide [facility]) 5-6.25 MG tablet TAKE 1 TABLET BY MOUTH DAILY 02/12/18  Yes Glean Hess, MD  carbamazepine (TEGRETOL) 200 MG tablet TAKE 1 TABLET(200 MG) BY MOUTH DAILY 02/12/18  Yes Glean Hess, MD  Cholecalciferol (D3 HIGH POTENCY) 1000 UNITS capsule Take by mouth. 09/22/14  Yes [provider]  Coral Calcium 500 MG TABS Take by mouth.   Yes [provider]  fluticasone (FLONASE) 50 MCG/ACT nasal spray Place 2 sprays into both nostrils daily. 11/16/15  Yes Glean Hess, MD  glucose blood (ONE TOUCH ULTRA TEST) test strip Test twice a day 04/19/18  Yes Glean Hess, MD  Insulin Detemir (LEVEMIR FLEXTOUCH) 100 UNIT/ML Pen Inject 50 Units into the skin daily. Patient taking differently: Inject 22 Units into the skin daily at 10 pm.  12/10/17 04/19/18 Yes Glean Hess, MD  meloxicam (MOBIC) 15 MG tablet Take 1 tablet by mouth daily. 07/16/15  Yes [provider]  metFORMIN (GLUCOPHAGE-XR) 500 MG 24 hr tablet Take 2 tablets (1,000 mg total) by mouth 2 (two) times daily. 07/17/17  Yes Glean Hess, MD  neomycin-polymyxin b-dexamethasone (MAXITROL) 3.5-10000-0.1 SUSP Place 2 drops into both eyes every 6 (six) hours. 11/16/15  Yes Glean Hess, MD  ramipril (ALTACE) 5 MG capsule TAKE 1 BY MOUTH DAILY 11/11/17  Yes Glean Hess, MD  valACYclovir (VALTREX) 1000 MG tablet Take 1 tablet (1,000 mg total) by mouth 3 (three) times daily. Patient not taking: Reported on 04/19/2018 09/01/16   Glean Hess, MD     ALLERGIES:  Allergies  Allergen Reactions  .  Pioglitazone Nausea Only     SOCIAL HISTORY:  Social History   Socioeconomic History  . Marital status: Married    Spouse name: Not on file  . Number of children: Not on file  . Years of education: Not on file  . Highest education level: Not on file  Occupational History  . Not on file  Social Needs  . Financial resource strain: Not on file  . Food insecurity:    Worry: Not on file    Inability: Not on file  . Transportation needs:    Medical: Not on file     Non-medical: Not on file  Tobacco Use  . Smoking status: Never Smoker  . Smokeless tobacco: Never Used  Substance and Sexual Activity  . Alcohol use: No    Alcohol/week: 0.0 oz  . Drug use: No  . Sexual activity: Never  Lifestyle  . Physical activity:    Days per week: Not on file    Minutes per session: Not on file  . Stress: Not on file  Relationships  . Social connections:    Talks on phone: Not on file    Gets together: Not on file    Attends religious service: Not on file    Active member of club or organization: Not on file    Attends meetings of clubs or organizations: Not on file    Relationship status: Not on file  . Intimate partner violence:    Fear of current or ex partner: Not on file    Emotionally abused: Not on file    Physically abused: Not on file    Forced sexual activity: Not on file  Other Topics Concern  . Not on file  Social History Narrative  . Not on file    The patient currently resides (home / rehab facility / nursing home): Home The patient normally is (ambulatory / bedbound): Ambulatory  FAMILY HISTORY:  Family History  Problem Relation Age of Onset  . Cancer Mother   . Diabetes Daughter   . Diabetes Son     Otherwise negative.   REVIEW OF SYSTEMS:  Constitutional: denies any other weight loss, fever, chills, or sweats  Eyes: denies any other vision changes, history of eye injury  ENT: denies sore throat, hearing problems  Respiratory: denies shortness of breath, wheezing  Cardiovascular: denies chest pain, palpitations  Gastrointestinal: abdominal pain, N/V, and bowel function as per HPI  Genitourinary: denies burning with urination or urinary frequency Musculoskeletal: denies any other joint pains or cramps  Skin: Denies any other rashes or skin discolorations  Neurological: denies any other headache, dizziness, weakness  Psychiatric: denies any other depression, anxiety   All other review of systems were otherwise  negative.  VITAL SIGNS:  Temp:  [97.9 F (36.6 C)-98.1 F (36.7 C)] 97.9 F (36.6 C) (06/03 1933) Pulse Rate:  [47-80] 47 (06/03 2300) Resp:  [11-23] 11 (06/03 2300) BP: (110-222)/(44-80) 168/54 (06/03 2300) SpO2:  [89 %-100 %] 98 % (06/03 2300) Weight:  [158 lb (71.7 kg)] 158 lb (71.7 kg) (06/03 1936)     Height: 5\' 2"  (157.5 cm) Weight: 158 lb (71.7 kg) BMI (Calculated): 28.89   INTAKE/OUTPUT:  This shift: No intake/output data recorded.  Last 2 shifts: @IOLAST2SHIFTS @  PHYSICAL EXAM:  Constitutional:  -- Overweight body habitus  -- Awake, alert, and oriented x3, no apparent distress Eyes:  -- Pupils equally round and reactive to light  -- No scleral icterus, B/L no occular discharge Ear, nose,  throat: -- Neck is FROM WNL -- No jugular venous distension  Pulmonary:  -- No wheezes or rhales -- Equal breath sounds bilaterally -- Breathing non-labored at rest Cardiovascular:  -- S1, S2 present  -- No pericardial rubs  Gastrointestinal:  -- Abdomen soft and currently non-tender, mildly distended with what seems to be a reduced and large-defect umbilical hernia (allowing passage of 2 fingers), no guarding or rebound tenderness -- No abdominal masses appreciated, pulsatile or otherwise  Musculoskeletal and Integumentary:  -- Wounds or skin discoloration: None appreciated except well-healed post-surgical abdominal wounds -- Extremities: B/L UE and LE FROM, hands and feet warm, no edema  Neurologic:  -- Motor function: Intact and symmetric -- Sensation: Intact and symmetric Psychiatric:  -- Mood and affect WNL  Labs:  CBC Latest Ref Rng & Units 04/19/2018 12/10/2017 07/09/2017  WBC 3.6 - 11.0 K/uL 7.2 5.7 6.2  Hemoglobin 12.0 - 16.0 g/dL 12.2 11.4 11.4(A)  Hematocrit 35.0 - 47.0 % 36.4 35.1 36  Platelets 150 - 440 K/uL 262 272 -   CMP Latest Ref Rng & Units 04/19/2018 12/10/2017 07/09/2017  Glucose 65 - 99 mg/dL 204(H) 122(H) -  BUN 6 - 20 mg/dL 16 16 12   Creatinine 0.44 -  1.00 mg/dL 0.98 0.98 1.0  Sodium 135 - 145 mmol/L 136 144 140  Potassium 3.5 - 5.1 mmol/L 4.1 4.8 4.8  Chloride 101 - 111 mmol/L 101 103 -  CO2 22 - 32 mmol/L 24 25 -  Calcium 8.9 - 10.3 mg/dL 9.3 9.6 -  Total Protein 6.5 - 8.1 g/dL 6.8 6.3 -  Total Bilirubin 0.3 - 1.2 mg/dL 0.5 0.3 -  Alkaline Phos 38 - 126 U/L 110 101 -  AST 15 - 41 U/L 16 13 11(A)  ALT 14 - 54 U/L 11(L) 12 15   Imaging studies:  CT Abdomen and Pelvis with Contrast (04/19/2018) - personally reviewed and discussed with patient and her family 1. High-grade small bowel obstruction with transition point noted within an umbilical hernia. Small ascites within the umbilical hernia. No pneumatosis or pneumoperitoneum. 2. Nonspecific 1.4 cm hypodense lesion within the right hepatic lobe adjacent to the IVC. Recommend nonemergent MRI of the abdomen with and without contrast for further evaluation. 3. Cholelithiasis.  Assessment/Plan: (ICD-10's: K42.0 vs K56.52) 72 y.o. female with small bowel obstruction, likely attributable to either her large easily reduced chronic umbilical hernia vs post-surgical adhesions in the vicinity of her umbilical hernia following prior colectomy with ileostomy and subsequent reversal of ileostomy, complicated by pertinent comorbidities including DM, HTN, HLD, seizure disorder, and history of colorectal cancer.  - NPO for now, IV fluids             - monitor ongoing bowel function and abdominal exam  - will hold off with NG tube for now, considering resolution if secondary to umbilical hernia  - if develops abdominal pain or nausea despite reduction of umbilical hernia, will insert NG tube for SBO attributable to post-surgical adhesions  - will also check abdominal x-ray tomorrow morning to supplement patient's exam  - surgical intervention if doesn't improve was also discussed             - medical management comorbidities             - ambulation encouraged             - DVT  prophylaxis  -- Corene Cornea E. Rosana Hoes, MD, [place]: [facility] - Partnering for exceptional  care. Office: [phone]

## 2018-04-19 NOTE — Addendum Note (Signed)
Addended by: Valere Dross on: 04/19/2018 03:24 PM   Modules accepted: Orders

## 2018-04-20 ENCOUNTER — Observation Stay: Payer: Medicare Other

## 2018-04-20 DIAGNOSIS — K56609 Unspecified intestinal obstruction, unspecified as to partial versus complete obstruction: Secondary | ICD-10-CM | POA: Diagnosis not present

## 2018-04-20 HISTORY — DX: Unspecified intestinal obstruction, unspecified as to partial versus complete obstruction: K56.609

## 2018-04-20 LAB — CBC
HCT: 31.2 % — ABNORMAL LOW (ref 35.0–47.0)
Hemoglobin: 10.5 g/dL — ABNORMAL LOW (ref 12.0–16.0)
MCH: 30.8 pg (ref 26.0–34.0)
MCHC: 33.5 g/dL (ref 32.0–36.0)
MCV: 91.9 fL (ref 80.0–100.0)
PLATELETS: 225 10*3/uL (ref 150–440)
RBC: 3.4 MIL/uL — ABNORMAL LOW (ref 3.80–5.20)
RDW: 14.3 % (ref 11.5–14.5)
WBC: 7.2 10*3/uL (ref 3.6–11.0)

## 2018-04-20 LAB — BASIC METABOLIC PANEL
Anion gap: 9 (ref 5–15)
BUN: 14 mg/dL (ref 6–20)
CO2: 24 mmol/L (ref 22–32)
CREATININE: 0.93 mg/dL (ref 0.44–1.00)
Calcium: 8.5 mg/dL — ABNORMAL LOW (ref 8.9–10.3)
Chloride: 103 mmol/L (ref 101–111)
GFR calc Af Amer: >60 mL/min (ref >60)
GFR, EST NON AFRICAN AMERICAN: 60 mL/min — AB (ref >60)
Glucose, Bld: 229 mg/dL — ABNORMAL HIGH (ref 65–99)
Potassium: 4 mmol/L (ref 3.5–5.1)
SODIUM: 136 mmol/L (ref 135–145)

## 2018-04-20 MED ORDER — ENOXAPARIN SODIUM 40 MG/0.4ML ~~LOC~~ SOLN
40.0000 mg | SUBCUTANEOUS | Status: DC
  Administered 2018-04-20: 40 mg via SUBCUTANEOUS
  Filled 2018-04-20: qty 0.4

## 2018-04-20 MED ORDER — KCL IN DEXTROSE-NACL 20-5-0.45 MEQ/L-%-% IV SOLN
INTRAVENOUS | Status: DC
  Administered 2018-04-20 (×2): via INTRAVENOUS
  Filled 2018-04-20 (×4): qty 1000

## 2018-04-20 NOTE — Progress Notes (Signed)
Made introductions to patient and daughter [name].  Patient is open to return visits; daughter was initially indifference, but became engaged. Patient is tired. Chaplain offered emotional support.and will follow-up if needed.

## 2018-04-20 NOTE — Progress Notes (Signed)
Barron Schmid to be D/C'd Home per MD order.  Discussed prescriptions and follow up appointments with the patient. Prescriptions given to patient, medication list explained in detail. Pt verbalized understanding.  Allergies as of 04/20/2018      Reactions   Pioglitazone Nausea Only      Medication List    TAKE these medications   aspirin 81 MG tablet Take 81 mg by mouth daily.   atorvastatin 40 MG tablet Commonly known as:  LIPITOR Take 1 tablet (40 mg total) by mouth daily.   bisoprolol-hydrochlorothiazide 5-6.25 MG tablet Commonly known as:  ZIAC TAKE 1 TABLET BY MOUTH DAILY   carbamazepine 200 MG tablet Commonly known as:  TEGRETOL TAKE 1 TABLET(200 MG) BY MOUTH DAILY   Coral Calcium 500 MG Tabs Take by mouth.   D3 HIGH POTENCY 1000 units capsule Generic drug:  Cholecalciferol Take by mouth.   fluticasone 50 MCG/ACT nasal spray Commonly known as:  FLONASE Place 2 sprays into both nostrils daily.   glucose blood test strip Commonly known as:  ONE TOUCH ULTRA TEST Test twice a day   Insulin Detemir 100 UNIT/ML Pen Commonly known as:  LEVEMIR FLEXTOUCH Inject 50 Units into the skin daily. What changed:    how much to take  when to take this   meloxicam 15 MG tablet Commonly known as:  MOBIC Take 1 tablet by mouth daily.   metFORMIN 500 MG 24 hr tablet Commonly known as:  GLUCOPHAGE-XR Take 2 tablets (1,000 mg total) by mouth 2 (two) times daily.   neomycin-polymyxin b-dexamethasone 3.5-10000-0.1 Susp Commonly known as:  MAXITROL Place 2 drops into both eyes every 6 (six) hours.   ramipril 5 MG capsule Commonly known as:  ALTACE TAKE 1 BY MOUTH DAILY   valACYclovir 1000 MG tablet Commonly known as:  VALTREX Take 1 tablet (1,000 mg total) by mouth 3 (three) times daily.       Vitals:   04/20/18 1012 04/20/18 1209  BP: 133/61 (!) 137/46  Pulse: (!) 49 (!) 42  Resp: 17 17  Temp: 97.7 F (36.5 C) 97.6 F (36.4 C)  SpO2: 97% 99%    Skin  clean, dry and intact without evidence of skin break down, no evidence of skin tears noted. IV catheter discontinued intact. Site without signs and symptoms of complications. Dressing and pressure applied. Pt denies pain at this time. No complaints noted.  An After Visit Summary was printed and given to the patient. Patient escorted via [place], and D/C home via private auto.  Sharalyn Ink

## 2018-04-20 NOTE — Plan of Care (Signed)
Pt has not had any complaints of pain or nausea

## 2018-04-20 NOTE — Discharge Instructions (Signed)
Follow-up with primary care physician and [facility] surgery for elective repair of ventral hernia. Resume all home medications Return to emergency room or [facility] if pain returns and hernia. Wear abdominal binder when up and about

## 2018-04-20 NOTE — Discharge Summary (Signed)
Physician Discharge Summary  Patient ID: ELONDA GIULIANO MRN: [id number] DOB/AGE: [birth year] 72 y.o.  Admit date: 04/19/2018 Discharge date: 04/20/2018   Discharge Diagnoses:  Active Problems:   SBO (small bowel obstruction) ([place])   Procedures: None  Hospital Course: This a patient with a long-standing and well known ventral hernia known to her colorectal surgeon at [facility] for some time.  She was admitted after it was reduced by the emergency room physician last night.  Today her pain is completely gone she has no further nausea or vomiting and is tolerating a diet.  An abdominal binder has been provided.  She will follow-up with her [facility] colorectal surgeon for repair at her discretion but is instructed to return to the emergency room should she have any additional problems or return of symptoms.  She is to restart all home medications.  Consults: None  Disposition: Discharge disposition: 01-Home or Self Care        Allergies as of 04/20/2018      Reactions   Pioglitazone Nausea Only      Medication List    TAKE these medications   aspirin 81 MG tablet Take 81 mg by mouth daily.   atorvastatin 40 MG tablet Commonly known as:  LIPITOR Take 1 tablet (40 mg total) by mouth daily.   bisoprolol-hydrochlorothiazide 5-6.25 MG tablet Commonly known as:  ZIAC TAKE 1 TABLET BY MOUTH DAILY   carbamazepine 200 MG tablet Commonly known as:  TEGRETOL TAKE 1 TABLET(200 MG) BY MOUTH DAILY   Coral Calcium 500 MG Tabs Take by mouth.   D3 HIGH POTENCY 1000 units capsule Generic drug:  Cholecalciferol Take by mouth.   fluticasone 50 MCG/ACT nasal spray Commonly known as:  FLONASE Place 2 sprays into both nostrils daily.   glucose blood test strip Commonly known as:  ONE TOUCH ULTRA TEST Test twice a day   Insulin Detemir 100 UNIT/ML Pen Commonly known as:  LEVEMIR FLEXTOUCH Inject 50 Units into the skin daily. What  changed:    how much to take  when to take this   meloxicam 15 MG tablet Commonly known as:  MOBIC Take 1 tablet by mouth daily.   metFORMIN 500 MG 24 hr tablet Commonly known as:  GLUCOPHAGE-XR Take 2 tablets (1,000 mg total) by mouth 2 (two) times daily.   neomycin-polymyxin b-dexamethasone 3.5-10000-0.1 Susp Commonly known as:  MAXITROL Place 2 drops into both eyes every 6 (six) hours.   ramipril 5 MG capsule Commonly known as:  ALTACE TAKE 1 BY MOUTH DAILY   valACYclovir 1000 MG tablet Commonly known as:  VALTREX Take 1 tablet (1,000 mg total) by mouth 3 (three) times daily.      Follow-up Information    Glean Hess, MD Follow up in 6 day(s).   Specialty:  Internal Medicine Contact information: [address] [place] [address] [phone]           Florene Glen, MD, FACS

## 2018-04-20 NOTE — Progress Notes (Signed)
CC: Ventral hernia Subjective: This patient to the hospital with a bowel obstruction and a ventral hernia.  It was reduced in the emergency room by the emergency room physician prior to Dr. Rosana Hoes seeing the patient.  Today she states that her pain is completely gone she did have some dizziness but no vomiting although she stated she dry heaves early this morning but has had no further abdominal pain.  She has no fevers or chills.  Objective: Vital signs in last 24 hours: Temp:  [97.5 F (36.4 C)-98.8 F (37.1 C)] 97.6 F (36.4 C) (06/04 1209) Pulse Rate:  [42-80] 42 (06/04 1209) Resp:  [11-23] 17 (06/04 1209) BP: (110-222)/(44-80) 137/46 (06/04 1209) SpO2:  [89 %-100 %] 99 % (06/04 1209) Weight:  [158 lb (71.7 kg)] 158 lb (71.7 kg) (06/03 1936)    Intake/Output from previous day: 06/03 0701 - 06/04 0700 In: -  Out: 100 [Urine:100] Intake/Output this shift: Total I/O In: -  Out: 550 [Urine:550]  Physical exam:  Awake alert and oriented vital signs are stable abdomen is soft nondistended nontympanitic and nontender there is a ventral hernia in the periumbilical area which is completely reducible nontender nonerythematous Calves are nontender  Lab Results: CBC  Recent Labs    04/19/18 1940 04/20/18 0443  WBC 7.2 7.2  HGB 12.2 10.5*  HCT 36.4 31.2*  PLT 262 225   BMET Recent Labs    04/19/18 1940 04/20/18 0443  NA 136 136  K 4.1 4.0  CL 101 103  CO2 24 24  GLUCOSE 204* 229*  BUN 16 14  CREATININE 0.98 0.93  CALCIUM 9.3 8.5*   PT/INR No results for input(s): LABPROT, INR in the last 72 hours. ABG No results for input(s): PHART, HCO3 in the last 72 hours.  Invalid input(s): PCO2, PO2  Studies/Results: Dg Abd 1 View  Result Date: 04/20/2018 CLINICAL DATA:  Assess known small bowel obstruction. EXAM: ABDOMEN - 1 VIEW COMPARISON:  Abdominal radiograph performed 04/19/2018 FINDINGS: The known small bowel obstruction is not well characterized on this study.  Residual stool is noted within the colon. The patient's periumbilical hernia is not well seen. No free intra-abdominal air is seen, though evaluation for free air is limited on a single supine view. Residual contrast is noted within the bladder. No acute osseous abnormalities are identified. Mild degenerative change is noted at the lower lumbar spine. IMPRESSION: Known small bowel obstruction is not well characterized on this study. No free intra-abdominal air seen. Electronically Signed   By: Garald Balding M.D.   On: 04/20/2018 06:54   Ct Abdomen Pelvis W Contrast  Result Date: 04/19/2018 CLINICAL DATA:  Right lower abdominal and flank pain with nausea. EXAM: CT ABDOMEN AND PELVIS WITH CONTRAST TECHNIQUE: Multidetector CT imaging of the abdomen and pelvis was performed using the standard protocol following bolus administration of intravenous contrast. CONTRAST:  158mL ISOVUE-300 IOPAMIDOL (ISOVUE-300) INJECTION 61% COMPARISON:  None. FINDINGS: Lower chest: No acute abnormality. Hepatobiliary: Hepatic steatosis. 1.4 cm hypodense lesion within the right hepatic lobe adjacent to the IVC. Large gallstone in the gallbladder fundus. No gallbladder wall thickening or biliary dilatation. Pancreas: Unremarkable. No pancreatic ductal dilatation or surrounding inflammatory changes. Spleen: Normal in size without focal abnormality. Adrenals/Urinary Tract: Adrenal glands are unremarkable. Kidneys are normal, without renal calculi, focal lesion, or hydronephrosis. Bladder is unremarkable. Stomach/Bowel: There multiple dilated loops of mid and distal small bowel with transition point seen within a umbilical hernia just proximal to the small bowel anastomosis. Postsurgical  changes in the distal colon related to prior partial colectomy. Normal appendix. The stomach is unremarkable. Vascular/Lymphatic: Aortic atherosclerosis. No enlarged abdominal or pelvic lymph nodes. Reproductive: Fibroid uterus.  No adnexal mass. Other:  Small amount of free fluid within the umbilical hernia. No pneumoperitoneum. Musculoskeletal: No acute or significant osseous findings. Severe lower lumbar facet arthropathy with grade 1 anterolisthesis at L5-S1. IMPRESSION: 1. High-grade small bowel obstruction with transition point noted within an umbilical hernia. Small ascites within the umbilical hernia. No pneumatosis or pneumoperitoneum. 2. Nonspecific 1.4 cm hypodense lesion within the right hepatic lobe adjacent to the IVC. Recommend nonemergent MRI of the abdomen with and without contrast for further evaluation. 3. Cholelithiasis. Electronically Signed   By: Titus Dubin M.D.   On: 04/19/2018 21:07    Anti-infectives: Anti-infectives (From admission, onward)   None      Assessment/Plan:  Small bowel obstruction ventral hernia.  The small bowel obstruction has resolved and the patient has no abdominal pain.  She is hungry.  Her hernia is completely reducible.  Abdominal films are personally reviewed.  Patient is doing much better at this point I discussed with her at length with her son present the options of surgical intervention versus observation and she discusses that she prefers [facility] where her colon surgery was performed.  I will advance her diet today and if she tolerates a diet she can be discharged with the understanding that she should have this repaired by her surgeon at [facility] when able.  Will advance diet now and discuss potential discharge.  Florene Glen, MD, FACS  04/20/2018

## 2018-04-20 NOTE — Progress Notes (Signed)
Inpatient Diabetes Program Recommendations  AACE/ADA: New Consensus Statement on Inpatient Glycemic Control (2015)  Target Ranges:  Prepandial:   less than 140 mg/dL      Peak postprandial:   less than 180 mg/dL (1-2 hours)      Critically ill patients:  140 - 180 mg/dL   Lab Results  Component Value Date   HGBA1C 8.0 (H) 04/14/2018    Review of Glycemic ControlResults for DEVONDA, PEQUIGNOT (MRN [id number]) as of 04/20/2018 14:07  Ref. Range 04/19/2018 19:40 04/20/2018 04:43  Glucose Latest Ref Range: 65 - 99 mg/dL 204 (H) 229 (H)   Diabetes history: Type 2 DM Outpatient Diabetes medications:  Levemir 22 units q HS, Metformin 1000 mg bid Current orders for Inpatient glycemic control:  None  Inpatient Diabetes Program Recommendations:    MD, please add Glycemic control order set- moderate correction tid with meals.  Also consider adding Levemir 10 units daily.    Thanks,  Adah Perl, RN, BC-ADM Inpatient Diabetes Coordinator Pager [phone] (8a-5p)

## 2018-04-20 NOTE — Progress Notes (Signed)
Patient is tolerating clear liquid diet has no abdominal pain no further dizziness is sitting up in the chair feels well and wants to go home.  I discussed with she and her daughter the options of repair but they seem to prefer to see their known and established Sports administrator at [facility].  Patient states that she sees him every 6 months anyway and that he knows about her hernia and has palpated it many times.  They have discussed repair in the past.  I suggested that if she wants to follow-up with [place] at any time we could repair this hernia or have the [facility] surgery department do it as well.  I will obtain an abdominal binder for her as she is completely nontender and reducible at this time.  She will follow-up as needed but is instructed to return to the emergency room or [facility] ER should pain or nausea vomiting return.

## 2018-04-23 ENCOUNTER — Telehealth: Payer: Self-pay

## 2018-04-23 NOTE — Telephone Encounter (Signed)
Pt called wanting to let you know that she had a partial bowel blockage, just an [place]

## 2018-05-02 DIAGNOSIS — K420 Umbilical hernia with obstruction, without gangrene: Secondary | ICD-10-CM

## 2018-05-07 DIAGNOSIS — I10 Essential (primary) hypertension: Secondary | ICD-10-CM | POA: Diagnosis not present

## 2018-05-07 DIAGNOSIS — Z85048 Personal history of other malignant neoplasm of rectum, rectosigmoid junction, and anus: Secondary | ICD-10-CM | POA: Diagnosis not present

## 2018-05-07 DIAGNOSIS — K429 Umbilical hernia without obstruction or gangrene: Secondary | ICD-10-CM | POA: Diagnosis not present

## 2018-05-07 DIAGNOSIS — E119 Type 2 diabetes mellitus without complications: Secondary | ICD-10-CM | POA: Diagnosis not present

## 2018-05-12 DIAGNOSIS — Z01818 Encounter for other preprocedural examination: Secondary | ICD-10-CM | POA: Diagnosis not present

## 2018-05-12 DIAGNOSIS — I10 Essential (primary) hypertension: Secondary | ICD-10-CM | POA: Diagnosis not present

## 2018-05-17 DIAGNOSIS — I10 Essential (primary) hypertension: Secondary | ICD-10-CM | POA: Diagnosis present

## 2018-05-17 DIAGNOSIS — Z7982 Long term (current) use of aspirin: Secondary | ICD-10-CM | POA: Diagnosis not present

## 2018-05-17 DIAGNOSIS — Z7984 Long term (current) use of oral hypoglycemic drugs: Secondary | ICD-10-CM | POA: Diagnosis not present

## 2018-05-17 DIAGNOSIS — G8918 Other acute postprocedural pain: Secondary | ICD-10-CM | POA: Diagnosis not present

## 2018-05-17 DIAGNOSIS — K429 Umbilical hernia without obstruction or gangrene: Secondary | ICD-10-CM | POA: Diagnosis not present

## 2018-05-17 DIAGNOSIS — E119 Type 2 diabetes mellitus without complications: Secondary | ICD-10-CM | POA: Diagnosis present

## 2018-05-17 DIAGNOSIS — K660 Peritoneal adhesions (postprocedural) (postinfection): Secondary | ICD-10-CM | POA: Diagnosis not present

## 2018-05-17 DIAGNOSIS — R1010 Upper abdominal pain, unspecified: Secondary | ICD-10-CM | POA: Diagnosis not present

## 2018-05-17 DIAGNOSIS — K436 Other and unspecified ventral hernia with obstruction, without gangrene: Secondary | ICD-10-CM | POA: Diagnosis not present

## 2018-05-17 DIAGNOSIS — R569 Unspecified convulsions: Secondary | ICD-10-CM | POA: Diagnosis present

## 2018-05-17 DIAGNOSIS — K430 Incisional hernia with obstruction, without gangrene: Secondary | ICD-10-CM | POA: Diagnosis present

## 2018-05-17 DIAGNOSIS — Z85048 Personal history of other malignant neoplasm of rectum, rectosigmoid junction, and anus: Secondary | ICD-10-CM | POA: Diagnosis not present

## 2018-05-17 HISTORY — PX: UMBILICAL HERNIA REPAIR: SHX196

## 2018-05-20 DIAGNOSIS — E119 Type 2 diabetes mellitus without complications: Secondary | ICD-10-CM | POA: Diagnosis not present

## 2018-05-20 DIAGNOSIS — Z48815 Encounter for surgical aftercare following surgery on the digestive system: Secondary | ICD-10-CM | POA: Diagnosis not present

## 2018-05-20 DIAGNOSIS — G40909 Epilepsy, unspecified, not intractable, without status epilepticus: Secondary | ICD-10-CM | POA: Diagnosis not present

## 2018-05-20 DIAGNOSIS — Z794 Long term (current) use of insulin: Secondary | ICD-10-CM | POA: Diagnosis not present

## 2018-05-20 DIAGNOSIS — I10 Essential (primary) hypertension: Secondary | ICD-10-CM | POA: Diagnosis not present

## 2018-05-20 DIAGNOSIS — Z9181 History of falling: Secondary | ICD-10-CM | POA: Diagnosis not present

## 2018-05-22 ENCOUNTER — Emergency Department: Payer: Medicare Other

## 2018-05-22 ENCOUNTER — Other Ambulatory Visit: Payer: Self-pay

## 2018-05-22 ENCOUNTER — Emergency Department
Admission: EM | Admit: 2018-05-22 | Discharge: 2018-05-22 | Disposition: A | Discharge status: Home / Self Care | Payer: Medicare Other | Attending: Emergency Medicine | Admitting: Emergency Medicine

## 2018-05-22 DIAGNOSIS — R109 Unspecified abdominal pain: Secondary | ICD-10-CM | POA: Diagnosis not present

## 2018-05-22 DIAGNOSIS — K5900 Constipation, unspecified: Secondary | ICD-10-CM | POA: Insufficient documentation

## 2018-05-22 DIAGNOSIS — I10 Essential (primary) hypertension: Secondary | ICD-10-CM | POA: Diagnosis not present

## 2018-05-22 DIAGNOSIS — Z85048 Personal history of other malignant neoplasm of rectum, rectosigmoid junction, and anus: Secondary | ICD-10-CM | POA: Diagnosis not present

## 2018-05-22 DIAGNOSIS — Z79899 Other long term (current) drug therapy: Secondary | ICD-10-CM | POA: Insufficient documentation

## 2018-05-22 DIAGNOSIS — Z7982 Long term (current) use of aspirin: Secondary | ICD-10-CM | POA: Diagnosis not present

## 2018-05-22 DIAGNOSIS — E119 Type 2 diabetes mellitus without complications: Secondary | ICD-10-CM | POA: Insufficient documentation

## 2018-05-22 DIAGNOSIS — Z794 Long term (current) use of insulin: Secondary | ICD-10-CM | POA: Diagnosis not present

## 2018-05-22 DIAGNOSIS — K439 Ventral hernia without obstruction or gangrene: Secondary | ICD-10-CM | POA: Diagnosis not present

## 2018-05-22 LAB — COMPREHENSIVE METABOLIC PANEL
ALBUMIN: 3.7 g/dL (ref 3.5–5.0)
ALT: 12 U/L (ref 0–44)
ANION GAP: 10 (ref 5–15)
AST: 19 U/L (ref 15–41)
Alkaline Phosphatase: 87 U/L (ref 38–126)
BILIRUBIN TOTAL: 0.8 mg/dL (ref 0.3–1.2)
BUN: 14 mg/dL (ref 8–23)
CHLORIDE: 98 mmol/L (ref 98–111)
CO2: 26 mmol/L (ref 22–32)
Calcium: 9 mg/dL (ref 8.9–10.3)
Creatinine, Ser: 0.9 mg/dL (ref 0.44–1.00)
GFR calc Af Amer: >60 mL/min (ref >60)
GFR calc non Af Amer: >60 mL/min (ref >60)
GLUCOSE: 161 mg/dL — AB (ref 70–99)
Potassium: 3.9 mmol/L (ref 3.5–5.1)
SODIUM: 134 mmol/L — AB (ref 135–145)
TOTAL PROTEIN: 6.7 g/dL (ref 6.5–8.1)

## 2018-05-22 LAB — CBC WITH DIFFERENTIAL/PLATELET
BASOS ABS: 0 10*3/uL (ref 0–0.1)
Basophils Relative: 0 %
EOS PCT: 2 %
Eosinophils Absolute: 0.2 10*3/uL (ref 0–0.7)
HCT: 29 % — ABNORMAL LOW (ref 35.0–47.0)
Hemoglobin: 10 g/dL — ABNORMAL LOW (ref 12.0–16.0)
LYMPHS PCT: 6 %
Lymphs Abs: 0.6 10*3/uL — ABNORMAL LOW (ref 1.0–3.6)
MCH: 31.2 pg (ref 26.0–34.0)
MCHC: 34.3 g/dL (ref 32.0–36.0)
MCV: 91 fL (ref 80.0–100.0)
Monocytes Absolute: 1.3 10*3/uL — ABNORMAL HIGH (ref 0.2–0.9)
Monocytes Relative: 12 %
NEUTROS ABS: 8.6 10*3/uL — AB (ref 1.4–6.5)
NEUTROS PCT: 80 %
Platelets: 311 10*3/uL (ref 150–440)
RBC: 3.19 MIL/uL — AB (ref 3.80–5.20)
RDW: 13.9 % (ref 11.5–14.5)
WBC: 10.6 10*3/uL (ref 3.6–11.0)

## 2018-05-22 MED ORDER — DOCUSATE SODIUM 100 MG PO CAPS
200.0000 mg | ORAL_CAPSULE | Freq: Once | ORAL | Status: AC
Start: 2018-05-22 — End: 2018-05-22
  Administered 2018-05-22: 200 mg via ORAL
  Filled 2018-05-22: qty 2

## 2018-05-22 MED ORDER — BISACODYL 5 MG PO TBEC: 5.0000 mg | DELAYED_RELEASE_TABLET | Freq: Every day | ORAL | 0 refills | Status: AC | PRN

## 2018-05-22 MED ORDER — BISACODYL 5 MG PO TBEC
10.0000 mg | DELAYED_RELEASE_TABLET | Freq: Once | ORAL | Status: AC
  Administered 2018-05-22: 10 mg via ORAL
  Filled 2018-05-22: qty 2

## 2018-05-22 MED ORDER — MINERAL OIL RE ENEM
1.0000 | ENEMA | Freq: Once | RECTAL | Status: AC
  Administered 2018-05-22: 1 via RECTAL

## 2018-05-22 MED ORDER — MAGNESIUM CITRATE PO SOLN
1.0000 | Freq: Once | ORAL | Status: AC
  Administered 2018-05-22: 1 via ORAL
  Filled 2018-05-22: qty 296

## 2018-05-22 MED ORDER — DOCUSATE SODIUM 100 MG PO CAPS: 100.0000 mg | ORAL_CAPSULE | Freq: Two times a day (BID) | ORAL | 0 refills | Status: AC

## 2018-05-22 NOTE — ED Provider Notes (Signed)
[facility] Emergency Department Provider Note  ____________________________________________   First MD Initiated Contact with Patient 05/22/18 [phone]     (approximate)  I have reviewed the triage vital signs and the nursing notes.   HISTORY  Chief Complaint Constipation   HPI [name] is a 72 y.o. female who self presents to the emergency department with 4 days of constipation.   6 days ago she had surgery for a ventral hernia and initially had a bowel movement following surgery however has not had one ever since.  She is passing small amounts of flatus.  She has a history of small bowel obstruction in the past and is concerned she could be obstructed.  She has moderate severity cramping diffuse abdominal pain with no nausea or vomiting.  No fevers or chills.  Her symptoms are constant.  Worse when trying to strain and improved with rest.   Past Medical History:  Diagnosis Date  . Cancer ([place])   . Diabetes mellitus without complication ([place])   . Hyperlipidemia   . Hypertension   . Seizures ([place])   . Vitamin D deficiency     Patient Active Problem List   Diagnosis Date Noted  . Umbilical hernia with obstruction but no gangrene   . SBO (small bowel obstruction) ([place]) 04/20/2018  . Chronic left hip pain 04/14/2018  . Neoplasm of uncertain behavior of skin 01/16/2017  . 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 Uncontrolled type 2 diabetes mellitus without complication, with long-term current use of insulin ([place]) 05/08/2015  . Essential (primary) hypertension 05/08/2015  . History of rectal cancer 05/08/2015  . Hyperlipidemia associated with type 2 diabetes mellitus ([place]) 05/08/2015  . Arthritis of knee, degenerative 05/08/2015  . Cerebral seizure 05/08/2015  . Shoulder strain 05/08/2015  . Avitaminosis D 05/08/2015    Past Surgical History:  Procedure Laterality Date  . COLOSTOMY REVERSAL  01/2014  . ILEOSTOMY  05/2013    Prior to Admission  medications   Medication Sig Start Date End Date Taking? Authorizing Provider  aspirin 81 MG tablet Take 81 mg by mouth daily.    [provider]  atorvastatin (LIPITOR) 40 MG tablet Take 1 tablet (40 mg total) by mouth daily. 12/10/17   Glean Hess, MD  bisacodyl (DULCOLAX) 5 MG EC tablet Take 1 tablet (5 mg total) by mouth daily as needed for moderate constipation. 05/22/18 05/22/19  Darel Hong, MD  bisoprolol-hydrochlorothiazide (ZIAC) 5-6.25 MG tablet TAKE 1 TABLET BY MOUTH DAILY 02/12/18   Glean Hess, MD  carbamazepine (TEGRETOL) 200 MG tablet TAKE 1 TABLET(200 MG) BY MOUTH DAILY 02/12/18   Glean Hess, MD  Cholecalciferol (D3 HIGH POTENCY) 1000 UNITS capsule Take by mouth. 09/22/14   [provider]  Coral Calcium 500 MG TABS Take by mouth.    [provider]  docusate sodium (COLACE) 100 MG capsule Take 1 capsule (100 mg total) by mouth 2 (two) times daily. 05/22/18 05/22/19  Darel Hong, MD  fluticasone (FLONASE) 50 MCG/ACT nasal spray Place 2 sprays into both nostrils daily. 11/16/15   Glean Hess, MD  glucose blood (ONE TOUCH ULTRA TEST) test strip Test twice a day 04/19/18   Glean Hess, MD  Insulin Detemir (LEVEMIR FLEXTOUCH) 100 UNIT/ML Pen Inject 50 Units into the skin daily. Patient taking differently: Inject 22 Units into the skin daily at 10 pm.  12/10/17 04/19/18  Glean Hess, MD  meloxicam (MOBIC) 15 MG tablet Take 1 tablet by mouth  daily. 07/16/15   [provider]  metFORMIN (GLUCOPHAGE-XR) 500 MG 24 hr tablet Take 2 tablets (1,000 mg total) by mouth 2 (two) times daily. 07/17/17   Glean Hess, MD  neomycin-polymyxin b-dexamethasone (MAXITROL) 3.5-10000-0.1 SUSP Place 2 drops into both eyes every 6 (six) hours. 11/16/15   Glean Hess, MD  ramipril (ALTACE) 5 MG capsule TAKE 1 BY MOUTH DAILY 11/11/17   Glean Hess, MD  valACYclovir (VALTREX) 1000 MG tablet Take 1 tablet (1,000 mg total) by mouth 3  (three) times daily. Patient not taking: Reported on 04/19/2018 09/01/16   Glean Hess, MD    Allergies Pioglitazone  Family History  Problem Relation Age of Onset  . Cancer Mother   . Diabetes Daughter   . Diabetes Son     Social History Social History   Tobacco Use  . Smoking status: Never Smoker  . Smokeless tobacco: Never Used  Substance Use Topics  . Alcohol use: No    Alcohol/week: 0.0 oz  . Drug use: No    Review of Systems Constitutional: No fever/chills Eyes: No visual changes. ENT: No sore throat. Cardiovascular: Denies chest pain. Respiratory: Denies shortness of breath. Gastrointestinal: Positive for abdominal pain.  No nausea, no vomiting.  No diarrhea.  Positive for constipation. Genitourinary: Negative for dysuria. Musculoskeletal: Negative for back pain. Skin: Negative for rash. Neurological: Negative for headaches, focal weakness or numbness.   ____________________________________________   PHYSICAL EXAM:  VITAL SIGNS: ED Triage Vitals  Enc Vitals Group     BP 05/22/18 0128 (!) 139/97     Pulse Rate 05/22/18 0128 79     Resp 05/22/18 0128 16     Temp 05/22/18 0128 98.7 F (37.1 C)     Temp src --      SpO2 05/22/18 0128 96 %     Weight 05/22/18 0129 156 lb (70.8 kg)     Height 05/22/18 0129 5\' 2"  (1.575 m)     Head Circumference --      Peak Flow --      Pain Score 05/22/18 0128 0     Pain Loc --      Pain Edu? --      Excl. in [place]? --     Constitutional: Alert and oriented x4 somewhat uncomfortable appearing nontoxic no diaphoresis speaks full clear sentences Eyes: PERRL EOMI. Head: Atraumatic. Nose: No congestion/rhinnorhea. Mouth/Throat: No trismus Neck: No stridor.   Cardiovascular: Normal rate, regular rhythm. Grossly normal heart sounds.  Good peripheral circulation. Respiratory: Normal respiratory effort.  No retractions. Lungs CTAB and moving good air Gastrointestinal: Slightly distended although not tense.  Mild  diffuse tenderness with no rebound or guarding no peritonitis Rectal exam performed with female nurse chaperone Wells Guiles: Thick dense stool but not impacted Musculoskeletal: No lower extremity edema   Neurologic:  Normal speech and language. No gross focal neurologic deficits are appreciated. Skin:  Skin is warm, dry and intact. No rash noted. Psychiatric: Mood and affect are normal. Speech and behavior are normal.    ____________________________________________   DIFFERENTIAL includes but not limited to  Volvulus, small bowel obstruction, fecal impaction, constipation ____________________________________________   LABS (all labs ordered are listed, but only abnormal results are displayed)  Labs Reviewed  CBC WITH DIFFERENTIAL/PLATELET - Abnormal; Notable for the following components:      Result Value   RBC 3.19 (*)    Hemoglobin 10.0 (*)    HCT 29.0 (*)    Neutro Abs 8.6 (*)  Lymphs Abs 0.6 (*)    Monocytes Absolute 1.3 (*)    All other components within normal limits  COMPREHENSIVE METABOLIC PANEL - Abnormal; Notable for the following components:   Sodium 134 (*)    Glucose, Bld 161 (*)    All other components within normal limits    Lab work reviewed by me with no acute disease __________________________________________  EKG   ____________________________________________  RADIOLOGY  CT abdomen pelvis reviewed by me with a large amount of stool but no impaction and no obstruction ____________________________________________   PROCEDURES  Procedure(s) performed: no  Procedures  Critical Care performed: no  ____________________________________________   INITIAL IMPRESSION / ASSESSMENT AND PLAN / ED COURSE  Pertinent labs & imaging results that were available during my care of the patient were reviewed by me and considered in my medical decision making (see chart for details).   The patient arrives uncomfortable appearing with significant constipation  in the postoperative period.  CT scan obtained which fortunately is negative for acute obstruction or infection however is concerning for a large fecal load likely secondary to ileus.  My digital rectal exam showed a large amount of thick stool which would not be amenable to digital disimpaction.  I will give her a trial of enema with magnesium citrate and Dulcolax.  Following the enema the patient had a large bowel movement and feels significantly improved.  Will discharge home with Dulcolax and Colace.      ____________________________________________   FINAL CLINICAL IMPRESSION(S) / ED DIAGNOSES  Final diagnoses:  Constipation, unspecified constipation type      NEW MEDICATIONS STARTED DURING THIS VISIT:  Discharge Medication List as of 05/22/2018  3:19 AM    START taking these medications   Details  bisacodyl (DULCOLAX) 5 MG EC tablet Take 1 tablet (5 mg total) by mouth daily as needed for moderate constipation., Starting Sat 05/22/2018, Until Sun 05/22/2019, Print    docusate sodium (COLACE) 100 MG capsule Take 1 capsule (100 mg total) by mouth 2 (two) times daily., Starting Sat 05/22/2018, Until Sun 05/22/2019, Print         Note:  This document was prepared using Dragon voice recognition software and may include unintentional dictation errors.     Darel Hong, MD 05/22/18 [phone]

## 2018-05-22 NOTE — Discharge Instructions (Addendum)
It was a pleasure to take care of you today, and thank you for coming to our emergency department.  If you have any questions or concerns before leaving please ask the nurse to grab me and I'm more than happy to go through your aftercare instructions again.  If you were prescribed any opioid pain medication today such as Norco, Vicodin, Percocet, morphine, hydrocodone, or oxycodone please make sure you do not drive when you are taking this medication as it can alter your ability to drive safely.  If you have any concerns once you are home that you are not improving or are in fact getting worse before you can make it to your follow-up appointment, please do not hesitate to call [phone] and come back for further evaluation.  Darel Hong, MD  Results for orders placed or performed during the hospital encounter of 05/22/18  CBC with Differential  Result Value Ref Range   WBC 10.6 3.6 - 11.0 K/uL   RBC 3.19 (L) 3.80 - 5.20 MIL/uL   Hemoglobin 10.0 (L) 12.0 - 16.0 g/dL   HCT 29.0 (L) 35.0 - 47.0 %   MCV 91.0 80.0 - 100.0 fL   MCH 31.2 26.0 - 34.0 pg   MCHC 34.3 32.0 - 36.0 g/dL   RDW 13.9 11.5 - 14.5 %   Platelets 311 150 - 440 K/uL   Neutrophils Relative % 80 %   Neutro Abs 8.6 (H) 1.4 - 6.5 K/uL   Lymphocytes Relative 6 %   Lymphs Abs 0.6 (L) 1.0 - 3.6 K/uL   Monocytes Relative 12 %   Monocytes Absolute 1.3 (H) 0.2 - 0.9 K/uL   Eosinophils Relative 2 %   Eosinophils Absolute 0.2 0 - 0.7 K/uL   Basophils Relative 0 %   Basophils Absolute 0.0 0 - 0.1 K/uL  Comprehensive metabolic panel  Result Value Ref Range   Sodium 134 (L) 135 - 145 mmol/L   Potassium 3.9 3.5 - 5.1 mmol/L   Chloride 98 98 - 111 mmol/L   CO2 26 22 - 32 mmol/L   Glucose, Bld 161 (H) 70 - 99 mg/dL   BUN 14 8 - 23 mg/dL   Creatinine, Ser 0.90 0.44 - 1.00 mg/dL   Calcium 9.0 8.9 - 10.3 mg/dL   Total Protein 6.7 6.5 - 8.1 g/dL   Albumin 3.7 3.5 - 5.0 g/dL   AST 19 15 - 41 U/L   ALT 12 0 - 44 U/L   Alkaline  Phosphatase 87 38 - 126 U/L   Total Bilirubin 0.8 0.3 - 1.2 mg/dL   GFR calc non Af Amer >60 >60 mL/min   GFR calc Af Amer >60 >60 mL/min   Anion gap 10 5 - 15   Ct Abdomen Pelvis Wo Contrast  Result Date: 05/22/2018 CLINICAL DATA:  Abdominal hernia repair on 05/17/2018. No bowel movement since 05/18/2018. EXAM: CT ABDOMEN AND PELVIS WITHOUT CONTRAST TECHNIQUE: Multidetector CT imaging of the abdomen and pelvis was performed following the standard protocol without IV contrast. COMPARISON:  04/19/2018 FINDINGS: Lower chest: Lung bases are clear. Coronary artery and aortic valve calcifications. Hepatobiliary: There is a large stone in the gallbladder. No gallbladder wall thickening, infiltration, or edema. Bile ducts are not dilated. No focal liver lesions. Pancreas: Unremarkable. No pancreatic ductal dilatation or surrounding inflammatory changes. Spleen: Normal in size without focal abnormality. Adrenals/Urinary Tract: Adrenal glands are unremarkable. Kidneys are normal, without renal calculi, focal lesion, or hydronephrosis. Bladder is unremarkable. Stomach/Bowel: Stomach, small bowel, and colon  are not abnormally distended. No wall thickening is appreciated. Scattered stool throughout the colon. Surgical clips consistent with anastomoses at the anorectal junction and in the terminal ileum. Appendix is normal. Vascular/Lymphatic: Aortic atherosclerosis. No enlarged abdominal or pelvic lymph nodes. Reproductive: Uterus and ovaries are not enlarged. Gas is demonstrated within the endometrium. Other: Free intra-abdominal air is present, likely postoperative. No abnormal intra-abdominal fluid collections. Interval takedown of the previous ventral hernia. No evidence of residual or recurrent hernia. Increased density in the anterior abdominal wall at the site of the hernia is likely postoperative seroma. Subcutaneous emphysema is also likely postoperative. Musculoskeletal: Degenerative changes in the spine.  Slight anterior subluxation of L5 on S1 is likely degenerative. No destructive bone lesions. IMPRESSION: 1. Postoperative changes consistent with takedown of previous ventral abdominal wall hernia. Subcutaneous emphysema and increased density in the anterior abdominal wall likely representing postoperative changes. No residual or recurrent hernia. 2. Free air in the abdomen is also likely postoperative. 3. Diffusely stool-filled colon. No small or large bowel obstruction or inflammation. 4. Cholelithiasis. 5. Aortic atherosclerosis. 6. Gas within the endometrium.  Etiology is uncertain. Electronically Signed   By: Lucienne Capers M.D.   On: 05/22/2018 02:00

## 2018-05-22 NOTE — ED Triage Notes (Signed)
Pt states she had an abdominal hernia repair on 05/17/2018. Pt states she has not had a bowel movement since 05/18/2018. Pt states she is not currently taking narcotics. Pt denies nausea.

## 2018-05-22 NOTE — ED Notes (Signed)
Pt to BR with large BM.

## 2018-05-25 DIAGNOSIS — Z48815 Encounter for surgical aftercare following surgery on the digestive system: Secondary | ICD-10-CM | POA: Diagnosis not present

## 2018-05-25 DIAGNOSIS — I10 Essential (primary) hypertension: Secondary | ICD-10-CM | POA: Diagnosis not present

## 2018-05-25 DIAGNOSIS — E119 Type 2 diabetes mellitus without complications: Secondary | ICD-10-CM | POA: Diagnosis not present

## 2018-05-25 DIAGNOSIS — Z794 Long term (current) use of insulin: Secondary | ICD-10-CM | POA: Diagnosis not present

## 2018-05-25 DIAGNOSIS — G40909 Epilepsy, unspecified, not intractable, without status epilepticus: Secondary | ICD-10-CM | POA: Diagnosis not present

## 2018-05-25 DIAGNOSIS — Z9181 History of falling: Secondary | ICD-10-CM | POA: Diagnosis not present

## 2018-05-27 DIAGNOSIS — G40909 Epilepsy, unspecified, not intractable, without status epilepticus: Secondary | ICD-10-CM | POA: Diagnosis not present

## 2018-05-27 DIAGNOSIS — E119 Type 2 diabetes mellitus without complications: Secondary | ICD-10-CM | POA: Diagnosis not present

## 2018-05-27 DIAGNOSIS — I10 Essential (primary) hypertension: Secondary | ICD-10-CM | POA: Diagnosis not present

## 2018-05-27 DIAGNOSIS — Z48815 Encounter for surgical aftercare following surgery on the digestive system: Secondary | ICD-10-CM | POA: Diagnosis not present

## 2018-05-27 DIAGNOSIS — Z9181 History of falling: Secondary | ICD-10-CM | POA: Diagnosis not present

## 2018-05-27 DIAGNOSIS — Z794 Long term (current) use of insulin: Secondary | ICD-10-CM | POA: Diagnosis not present

## 2018-05-31 DIAGNOSIS — Z9181 History of falling: Secondary | ICD-10-CM | POA: Diagnosis not present

## 2018-05-31 DIAGNOSIS — I10 Essential (primary) hypertension: Secondary | ICD-10-CM | POA: Diagnosis not present

## 2018-05-31 DIAGNOSIS — G40909 Epilepsy, unspecified, not intractable, without status epilepticus: Secondary | ICD-10-CM | POA: Diagnosis not present

## 2018-05-31 DIAGNOSIS — Z794 Long term (current) use of insulin: Secondary | ICD-10-CM | POA: Diagnosis not present

## 2018-05-31 DIAGNOSIS — Z48815 Encounter for surgical aftercare following surgery on the digestive system: Secondary | ICD-10-CM | POA: Diagnosis not present

## 2018-05-31 DIAGNOSIS — E119 Type 2 diabetes mellitus without complications: Secondary | ICD-10-CM | POA: Diagnosis not present

## 2018-06-01 DIAGNOSIS — Z794 Long term (current) use of insulin: Secondary | ICD-10-CM | POA: Diagnosis not present

## 2018-06-01 DIAGNOSIS — Z9181 History of falling: Secondary | ICD-10-CM | POA: Diagnosis not present

## 2018-06-01 DIAGNOSIS — Z48815 Encounter for surgical aftercare following surgery on the digestive system: Secondary | ICD-10-CM | POA: Diagnosis not present

## 2018-06-01 DIAGNOSIS — E119 Type 2 diabetes mellitus without complications: Secondary | ICD-10-CM | POA: Diagnosis not present

## 2018-06-01 DIAGNOSIS — G40909 Epilepsy, unspecified, not intractable, without status epilepticus: Secondary | ICD-10-CM | POA: Diagnosis not present

## 2018-06-01 DIAGNOSIS — I10 Essential (primary) hypertension: Secondary | ICD-10-CM | POA: Diagnosis not present

## 2018-06-02 DIAGNOSIS — I10 Essential (primary) hypertension: Secondary | ICD-10-CM | POA: Diagnosis not present

## 2018-06-02 DIAGNOSIS — E119 Type 2 diabetes mellitus without complications: Secondary | ICD-10-CM | POA: Diagnosis not present

## 2018-06-02 DIAGNOSIS — G40909 Epilepsy, unspecified, not intractable, without status epilepticus: Secondary | ICD-10-CM | POA: Diagnosis not present

## 2018-06-02 DIAGNOSIS — Z9181 History of falling: Secondary | ICD-10-CM | POA: Diagnosis not present

## 2018-06-02 DIAGNOSIS — Z794 Long term (current) use of insulin: Secondary | ICD-10-CM | POA: Diagnosis not present

## 2018-06-02 DIAGNOSIS — Z48815 Encounter for surgical aftercare following surgery on the digestive system: Secondary | ICD-10-CM | POA: Diagnosis not present

## 2018-06-03 DIAGNOSIS — Z794 Long term (current) use of insulin: Secondary | ICD-10-CM | POA: Diagnosis not present

## 2018-06-03 DIAGNOSIS — E119 Type 2 diabetes mellitus without complications: Secondary | ICD-10-CM | POA: Diagnosis not present

## 2018-06-03 DIAGNOSIS — I10 Essential (primary) hypertension: Secondary | ICD-10-CM | POA: Diagnosis not present

## 2018-06-03 DIAGNOSIS — G40909 Epilepsy, unspecified, not intractable, without status epilepticus: Secondary | ICD-10-CM | POA: Diagnosis not present

## 2018-06-03 DIAGNOSIS — Z9181 History of falling: Secondary | ICD-10-CM | POA: Diagnosis not present

## 2018-06-03 DIAGNOSIS — Z48815 Encounter for surgical aftercare following surgery on the digestive system: Secondary | ICD-10-CM | POA: Diagnosis not present

## 2018-06-04 DIAGNOSIS — Z9689 Presence of other specified functional implants: Secondary | ICD-10-CM | POA: Diagnosis not present

## 2018-06-04 DIAGNOSIS — Z8719 Personal history of other diseases of the digestive system: Secondary | ICD-10-CM | POA: Diagnosis not present

## 2018-06-04 DIAGNOSIS — Z09 Encounter for follow-up examination after completed treatment for conditions other than malignant neoplasm: Secondary | ICD-10-CM | POA: Diagnosis not present

## 2018-06-08 DIAGNOSIS — G40909 Epilepsy, unspecified, not intractable, without status epilepticus: Secondary | ICD-10-CM | POA: Diagnosis not present

## 2018-06-08 DIAGNOSIS — Z48815 Encounter for surgical aftercare following surgery on the digestive system: Secondary | ICD-10-CM | POA: Diagnosis not present

## 2018-06-08 DIAGNOSIS — E119 Type 2 diabetes mellitus without complications: Secondary | ICD-10-CM | POA: Diagnosis not present

## 2018-06-08 DIAGNOSIS — Z794 Long term (current) use of insulin: Secondary | ICD-10-CM | POA: Diagnosis not present

## 2018-06-08 DIAGNOSIS — Z9181 History of falling: Secondary | ICD-10-CM | POA: Diagnosis not present

## 2018-06-08 DIAGNOSIS — I10 Essential (primary) hypertension: Secondary | ICD-10-CM | POA: Diagnosis not present

## 2018-06-10 DIAGNOSIS — Z794 Long term (current) use of insulin: Secondary | ICD-10-CM | POA: Diagnosis not present

## 2018-06-10 DIAGNOSIS — Z48815 Encounter for surgical aftercare following surgery on the digestive system: Secondary | ICD-10-CM | POA: Diagnosis not present

## 2018-06-10 DIAGNOSIS — Z9181 History of falling: Secondary | ICD-10-CM | POA: Diagnosis not present

## 2018-06-10 DIAGNOSIS — I10 Essential (primary) hypertension: Secondary | ICD-10-CM | POA: Diagnosis not present

## 2018-06-10 DIAGNOSIS — G40909 Epilepsy, unspecified, not intractable, without status epilepticus: Secondary | ICD-10-CM | POA: Diagnosis not present

## 2018-06-10 DIAGNOSIS — E119 Type 2 diabetes mellitus without complications: Secondary | ICD-10-CM | POA: Diagnosis not present

## 2018-06-25 DIAGNOSIS — Z8719 Personal history of other diseases of the digestive system: Secondary | ICD-10-CM | POA: Diagnosis not present

## 2018-06-25 DIAGNOSIS — Z9889 Other specified postprocedural states: Secondary | ICD-10-CM | POA: Diagnosis not present

## 2018-06-26 NOTE — Telephone Encounter (Signed)
Scheduled for 09/27/18

## 2018-07-09 DIAGNOSIS — M2012 Hallux valgus (acquired), left foot: Secondary | ICD-10-CM | POA: Diagnosis not present

## 2018-07-09 DIAGNOSIS — M2041 Other hammer toe(s) (acquired), right foot: Secondary | ICD-10-CM | POA: Diagnosis not present

## 2018-07-09 DIAGNOSIS — B351 Tinea unguium: Secondary | ICD-10-CM | POA: Diagnosis not present

## 2018-07-09 DIAGNOSIS — E119 Type 2 diabetes mellitus without complications: Secondary | ICD-10-CM | POA: Diagnosis not present

## 2018-07-09 DIAGNOSIS — M2011 Hallux valgus (acquired), right foot: Secondary | ICD-10-CM | POA: Diagnosis not present

## 2018-07-24 ENCOUNTER — Observation Stay: Payer: Medicare Other

## 2018-07-24 ENCOUNTER — Observation Stay
Admission: EM | Admit: 2018-07-24 | Discharge: 2018-07-26 | Disposition: A | Discharge status: Home / Self Care | Payer: Medicare Other | Attending: Internal Medicine | Admitting: Internal Medicine

## 2018-07-24 ENCOUNTER — Other Ambulatory Visit: Payer: Self-pay

## 2018-07-24 DIAGNOSIS — G40909 Epilepsy, unspecified, not intractable, without status epilepticus: Secondary | ICD-10-CM | POA: Diagnosis not present

## 2018-07-24 DIAGNOSIS — Z794 Long term (current) use of insulin: Secondary | ICD-10-CM | POA: Insufficient documentation

## 2018-07-24 DIAGNOSIS — Z85048 Personal history of other malignant neoplasm of rectum, rectosigmoid junction, and anus: Secondary | ICD-10-CM | POA: Diagnosis not present

## 2018-07-24 DIAGNOSIS — R112 Nausea with vomiting, unspecified: Secondary | ICD-10-CM | POA: Diagnosis not present

## 2018-07-24 DIAGNOSIS — I214 Non-ST elevation (NSTEMI) myocardial infarction: Secondary | ICD-10-CM | POA: Diagnosis not present

## 2018-07-24 DIAGNOSIS — Z7982 Long term (current) use of aspirin: Secondary | ICD-10-CM | POA: Insufficient documentation

## 2018-07-24 DIAGNOSIS — E559 Vitamin D deficiency, unspecified: Secondary | ICD-10-CM | POA: Diagnosis not present

## 2018-07-24 DIAGNOSIS — R42 Dizziness and giddiness: Secondary | ICD-10-CM | POA: Diagnosis not present

## 2018-07-24 DIAGNOSIS — E119 Type 2 diabetes mellitus without complications: Secondary | ICD-10-CM | POA: Diagnosis not present

## 2018-07-24 DIAGNOSIS — R001 Bradycardia, unspecified: Principal | ICD-10-CM | POA: Diagnosis present

## 2018-07-24 DIAGNOSIS — I10 Essential (primary) hypertension: Secondary | ICD-10-CM | POA: Insufficient documentation

## 2018-07-24 DIAGNOSIS — E785 Hyperlipidemia, unspecified: Secondary | ICD-10-CM | POA: Insufficient documentation

## 2018-07-24 DIAGNOSIS — Z79899 Other long term (current) drug therapy: Secondary | ICD-10-CM | POA: Insufficient documentation

## 2018-07-24 DIAGNOSIS — R9431 Abnormal electrocardiogram [ECG] [EKG]: Secondary | ICD-10-CM | POA: Diagnosis not present

## 2018-07-24 LAB — CBC
HCT: 33.3 % — ABNORMAL LOW (ref 35.0–47.0)
Hemoglobin: 11.5 g/dL — ABNORMAL LOW (ref 12.0–16.0)
MCH: 30.1 pg (ref 26.0–34.0)
MCHC: 34.6 g/dL (ref 32.0–36.0)
MCV: 87 fL (ref 80.0–100.0)
PLATELETS: 248 10*3/uL (ref 150–440)
RBC: 3.83 MIL/uL (ref 3.80–5.20)
RDW: 15.1 % — AB (ref 11.5–14.5)
WBC: 6.3 10*3/uL (ref 3.6–11.0)

## 2018-07-24 LAB — GLUCOSE, CAPILLARY
GLUCOSE-CAPILLARY: 154 mg/dL — AB (ref 70–99)
Glucose-Capillary: 138 mg/dL — ABNORMAL HIGH (ref 70–99)
Glucose-Capillary: 229 mg/dL — ABNORMAL HIGH (ref 70–99)

## 2018-07-24 LAB — URINALYSIS, COMPLETE (UACMP) WITH MICROSCOPIC
BILIRUBIN URINE: NEGATIVE
Bacteria, UA: NONE SEEN
GLUCOSE, UA: NEGATIVE mg/dL
HGB URINE DIPSTICK: NEGATIVE
KETONES UR: NEGATIVE mg/dL
LEUKOCYTES UA: NEGATIVE
NITRITE: NEGATIVE
PH: 8 (ref 5.0–8.0)
Protein, ur: NEGATIVE mg/dL
Specific Gravity, Urine: 1.011 (ref 1.005–1.030)

## 2018-07-24 LAB — MAGNESIUM: Magnesium: 1.8 mg/dL (ref 1.7–2.4)

## 2018-07-24 LAB — COMPREHENSIVE METABOLIC PANEL
ALT: 10 U/L (ref 0–44)
AST: 17 U/L (ref 15–41)
Albumin: 3.9 g/dL (ref 3.5–5.0)
Alkaline Phosphatase: 104 U/L (ref 38–126)
Anion gap: 10 (ref 5–15)
BILIRUBIN TOTAL: 0.7 mg/dL (ref 0.3–1.2)
BUN: 15 mg/dL (ref 8–23)
CO2: 25 mmol/L (ref 22–32)
CREATININE: 1.06 mg/dL — AB (ref 0.44–1.00)
Calcium: 9 mg/dL (ref 8.9–10.3)
Chloride: 103 mmol/L (ref 98–111)
GFR, EST AFRICAN AMERICAN: 59 mL/min — AB (ref >60)
GFR, EST NON AFRICAN AMERICAN: 51 mL/min — AB (ref >60)
Glucose, Bld: 183 mg/dL — ABNORMAL HIGH (ref 70–99)
POTASSIUM: 4.3 mmol/L (ref 3.5–5.1)
Sodium: 138 mmol/L (ref 135–145)
TOTAL PROTEIN: 6.8 g/dL (ref 6.5–8.1)

## 2018-07-24 LAB — TROPONIN I
TROPONIN I: 0.16 ng/mL — AB (ref <0.03)
Troponin I: 0.04 ng/mL (ref <0.03)

## 2018-07-24 LAB — LIPASE, BLOOD: Lipase: 26 U/L (ref 11–51)

## 2018-07-24 MED ORDER — SODIUM CHLORIDE 0.9 % IV SOLN: 250.0000 mL | INTRAVENOUS | Status: DC | PRN

## 2018-07-24 MED ORDER — FLUTICASONE PROPIONATE 50 MCG/ACT NA SUSP
2.0000 | Freq: Every day | NASAL | Status: DC
  Administered 2018-07-25: 2 via NASAL
  Filled 2018-07-24: qty 16

## 2018-07-24 MED ORDER — BISACODYL 5 MG PO TBEC
5.0000 mg | DELAYED_RELEASE_TABLET | Freq: Every day | ORAL | Status: DC | PRN
  Administered 2018-07-26: 5 mg via ORAL
  Filled 2018-07-24: qty 1

## 2018-07-24 MED ORDER — ASPIRIN 300 MG RE SUPP: 300.0000 mg | RECTAL | Status: AC

## 2018-07-24 MED ORDER — HYDROCHLOROTHIAZIDE 12.5 MG PO CAPS
12.5000 mg | ORAL_CAPSULE | Freq: Every day | ORAL | Status: DC
  Administered 2018-07-24 – 2018-07-26 (×3): 12.5 mg via ORAL
  Filled 2018-07-24 (×3): qty 1

## 2018-07-24 MED ORDER — INSULIN ASPART 100 UNIT/ML ~~LOC~~ SOLN
0.0000 [IU] | Freq: Every day | SUBCUTANEOUS | Status: DC
  Administered 2018-07-24 – 2018-07-25 (×2): 2 [IU] via SUBCUTANEOUS
  Filled 2018-07-24 (×2): qty 1

## 2018-07-24 MED ORDER — SODIUM CHLORIDE 0.9 % IV BOLUS
500.0000 mL | Freq: Once | INTRAVENOUS | Status: AC
  Administered 2018-07-24: 500 mL via INTRAVENOUS

## 2018-07-24 MED ORDER — ENOXAPARIN SODIUM 40 MG/0.4ML ~~LOC~~ SOLN
40.0000 mg | SUBCUTANEOUS | Status: DC
  Administered 2018-07-24 – 2018-07-25 (×2): 40 mg via SUBCUTANEOUS
  Filled 2018-07-24 (×2): qty 0.4

## 2018-07-24 MED ORDER — RAMIPRIL 5 MG PO CAPS
5.0000 mg | ORAL_CAPSULE | Freq: Every day | ORAL | Status: DC
  Administered 2018-07-25 – 2018-07-26 (×2): 5 mg via ORAL
  Filled 2018-07-24 (×3): qty 1

## 2018-07-24 MED ORDER — HYDRALAZINE HCL 20 MG/ML IJ SOLN
3.0000 mg | Freq: Once | INTRAMUSCULAR | Status: AC
  Administered 2018-07-24: 3 mg via INTRAVENOUS

## 2018-07-24 MED ORDER — HYDRALAZINE HCL 20 MG/ML IJ SOLN: 2.0000 mg | Freq: Once | INTRAMUSCULAR | Status: DC

## 2018-07-24 MED ORDER — ASPIRIN EC 81 MG PO TBEC: 81.0000 mg | DELAYED_RELEASE_TABLET | Freq: Every day | ORAL | Status: DC

## 2018-07-24 MED ORDER — ATORVASTATIN CALCIUM 20 MG PO TABS
40.0000 mg | ORAL_TABLET | Freq: Every day | ORAL | Status: DC
  Administered 2018-07-25 – 2018-07-26 (×2): 40 mg via ORAL
  Filled 2018-07-24 (×3): qty 2

## 2018-07-24 MED ORDER — SODIUM CHLORIDE 0.9% FLUSH
3.0000 mL | Freq: Two times a day (BID) | INTRAVENOUS | Status: DC
  Administered 2018-07-24: 3 mL via INTRAVENOUS

## 2018-07-24 MED ORDER — HYDRALAZINE HCL 20 MG/ML IJ SOLN: 5.0000 mg | Freq: Once | INTRAMUSCULAR | Status: DC

## 2018-07-24 MED ORDER — HYDRALAZINE HCL 20 MG/ML IJ SOLN
INTRAMUSCULAR | Status: AC
  Filled 2018-07-24: qty 1

## 2018-07-24 MED ORDER — SODIUM CHLORIDE 0.9% FLUSH: 3.0000 mL | INTRAVENOUS | Status: DC | PRN

## 2018-07-24 MED ORDER — HYDRALAZINE HCL 20 MG/ML IJ SOLN
10.0000 mg | INTRAMUSCULAR | Status: DC | PRN
  Administered 2018-07-24 – 2018-07-26 (×2): 10 mg via INTRAVENOUS
  Filled 2018-07-24 (×2): qty 1

## 2018-07-24 MED ORDER — HYDRALAZINE HCL 25 MG PO TABS
25.0000 mg | ORAL_TABLET | Freq: Three times a day (TID) | ORAL | Status: DC
  Administered 2018-07-24 – 2018-07-26 (×6): 25 mg via ORAL
  Filled 2018-07-24 (×6): qty 1

## 2018-07-24 MED ORDER — ONDANSETRON HCL 4 MG/2ML IJ SOLN
4.0000 mg | Freq: Once | INTRAMUSCULAR | Status: AC
  Administered 2018-07-24: 4 mg via INTRAVENOUS
  Filled 2018-07-24: qty 2

## 2018-07-24 MED ORDER — SODIUM CHLORIDE 0.9 % IV SOLN
INTRAVENOUS | Status: DC
  Administered 2018-07-24 – 2018-07-25 (×2): via INTRAVENOUS

## 2018-07-24 MED ORDER — NEOMYCIN-POLYMYXIN-DEXAMETH 3.5-10000-0.1 OP SUSP
2.0000 [drp] | Freq: Four times a day (QID) | OPHTHALMIC | Status: DC
  Administered 2018-07-24 – 2018-07-26 (×3): 2 [drp] via OPHTHALMIC
  Filled 2018-07-24: qty 5

## 2018-07-24 MED ORDER — DOCUSATE SODIUM 100 MG PO CAPS
100.0000 mg | ORAL_CAPSULE | Freq: Two times a day (BID) | ORAL | Status: DC
  Administered 2018-07-24 – 2018-07-26 (×4): 100 mg via ORAL
  Filled 2018-07-24 (×4): qty 1

## 2018-07-24 MED ORDER — HYDROCHLOROTHIAZIDE 10 MG/ML ORAL SUSPENSION: 6.2500 mg | Freq: Every day | ORAL | Status: DC

## 2018-07-24 MED ORDER — ACETAMINOPHEN 325 MG PO TABS: 650.0000 mg | ORAL_TABLET | ORAL | Status: DC | PRN

## 2018-07-24 MED ORDER — ONDANSETRON HCL 4 MG/2ML IJ SOLN: 4.0000 mg | Freq: Four times a day (QID) | INTRAMUSCULAR | Status: DC | PRN

## 2018-07-24 MED ORDER — ASPIRIN 81 MG PO CHEW
324.0000 mg | CHEWABLE_TABLET | ORAL | Status: AC
  Administered 2018-07-24: 324 mg via ORAL
  Filled 2018-07-24: qty 4

## 2018-07-24 MED ORDER — RAMIPRIL 5 MG PO CAPS
5.0000 mg | ORAL_CAPSULE | Freq: Once | ORAL | Status: AC
  Administered 2018-07-24: 5 mg via ORAL
  Filled 2018-07-24: qty 1

## 2018-07-24 MED ORDER — ASPIRIN EC 81 MG PO TBEC
81.0000 mg | DELAYED_RELEASE_TABLET | Freq: Every day | ORAL | Status: DC
  Administered 2018-07-25 – 2018-07-26 (×2): 81 mg via ORAL
  Filled 2018-07-24 (×2): qty 1

## 2018-07-24 MED ORDER — CARBAMAZEPINE 200 MG PO TABS
200.0000 mg | ORAL_TABLET | Freq: Three times a day (TID) | ORAL | Status: DC
  Administered 2018-07-25 – 2018-07-26 (×2): 200 mg via ORAL
  Filled 2018-07-24 (×7): qty 1

## 2018-07-24 MED ORDER — INSULIN DETEMIR 100 UNIT/ML ~~LOC~~ SOLN
22.0000 [IU] | Freq: Every day | SUBCUTANEOUS | Status: DC
  Administered 2018-07-24 – 2018-07-25 (×2): 22 [IU] via SUBCUTANEOUS
  Filled 2018-07-24 (×3): qty 0.22

## 2018-07-24 MED ORDER — NITROGLYCERIN 0.4 MG SL SUBL: 0.4000 mg | SUBLINGUAL_TABLET | SUBLINGUAL | Status: DC | PRN

## 2018-07-24 MED ORDER — INSULIN ASPART 100 UNIT/ML ~~LOC~~ SOLN
0.0000 [IU] | Freq: Three times a day (TID) | SUBCUTANEOUS | Status: DC
  Administered 2018-07-24 – 2018-07-25 (×2): 2 [IU] via SUBCUTANEOUS
  Administered 2018-07-25: 3 [IU] via SUBCUTANEOUS
  Administered 2018-07-25: 2 [IU] via SUBCUTANEOUS
  Filled 2018-07-24 (×4): qty 1

## 2018-07-24 NOTE — ED Notes (Signed)
HCTZ 12.5 not arrived from pharmacy

## 2018-07-24 NOTE — Care Management Obs Status (Signed)
[place] NOTIFICATION   Patient Details  Name: BRESHA HOSACK MRN: [id number] Date of Birth: Mar 30, 1946   Medicare Observation Status Notification Given:  Yes    Perley Arthurs A Lafreda Casebeer, RN 07/24/2018, 3:21 PM

## 2018-07-24 NOTE — H&P (Addendum)
[place] [facility] [place] NAME: [name]    MR#:  [id number]  DATE OF BIRTH:  [birth year]  DATE OF ADMISSION:  07/24/2018  PRIMARY CARE PHYSICIAN: Glean Hess, MD   REQUESTING/REFERRING PHYSICIAN:   CHIEF COMPLAINT:   Chief Complaint  Patient presents with  . Emesis    HISTORY OF PRESENT ILLNESS: [name]  is a 72 y.o. female with a known history of diabetes mellitus type 2, hypertension, hyperlipidemia, seizure disorder, vitamin D deficiency presented to the emergency room for nausea and vomiting.  She also felt dizzy and lightheaded.  The nausea and vomiting has improved after arrival to the emergency room.  She was found to be bradycardic in the emergency room.  No complaints of any chest pain.  No complaints of any shortness of breath.  First set of troponin is 0.04.  No recent lab work-up done.  Hospitalist service is consulted for further care.  PAST MEDICAL HISTORY:   Past Medical History:  Diagnosis Date  . Cancer ([place])   . Diabetes mellitus without complication ([place])   . Hyperlipidemia   . Hypertension   . Seizures ([place])   . Vitamin D deficiency     PAST SURGICAL HISTORY:  Past Surgical History:  Procedure Laterality Date  . COLOSTOMY REVERSAL  01/2014  . ILEOSTOMY  05/2013    SOCIAL HISTORY:  Social History   Tobacco Use  . Smoking status: Never Smoker  . Smokeless tobacco: Never Used  Substance Use Topics  . Alcohol use: No    Alcohol/week: 0.0 standard drinks    FAMILY HISTORY:  Family History  Problem Relation Age of Onset  . Cancer Mother   . Diabetes Daughter   . Diabetes Son     DRUG ALLERGIES:  Allergies  Allergen Reactions  . Pioglitazone Nausea Only    REVIEW OF SYSTEMS:   CONSTITUTIONAL: No fever,has fatigue and weakness.  EYES: No blurred or double vision.  EARS, NOSE, AND THROAT: No tinnitus or ear pain.  RESPIRATORY: No cough, shortness of breath, wheezing or  hemoptysis.  CARDIOVASCULAR: No chest pain, orthopnea, edema.  GASTROINTESTINAL: Has nausea, vomiting,no diarrhea or abdominal pain.  GENITOURINARY: No dysuria, hematuria.  ENDOCRINE: No polyuria, nocturia,  HEMATOLOGY: No anemia, easy bruising or bleeding SKIN: No rash or lesion. MUSCULOSKELETAL: No joint pain or arthritis.   NEUROLOGIC: No tingling, numbness, weakness.  Has dizziness PSYCHIATRY: No anxiety or depression.   MEDICATIONS AT HOME:  Prior to Admission medications   Medication Sig Start Date End Date Taking? Authorizing Provider  aspirin 81 MG tablet Take 81 mg by mouth daily.    [provider]  atorvastatin (LIPITOR) 40 MG tablet Take 1 tablet (40 mg total) by mouth daily. 12/10/17   Glean Hess, MD  bisacodyl (DULCOLAX) 5 MG EC tablet Take 1 tablet (5 mg total) by mouth daily as needed for moderate constipation. 05/22/18 05/22/19  Darel Hong, MD  bisoprolol-hydrochlorothiazide (ZIAC) 5-6.25 MG tablet TAKE 1 TABLET BY MOUTH DAILY 02/12/18   Glean Hess, MD  carbamazepine (TEGRETOL) 200 MG tablet TAKE 1 TABLET(200 MG) BY MOUTH DAILY 02/12/18   Glean Hess, MD  Cholecalciferol (D3 HIGH POTENCY) 1000 UNITS capsule Take by mouth. 09/22/14   [provider]  Coral Calcium 500 MG TABS Take by mouth.    [provider]  docusate sodium (COLACE) 100 MG capsule Take 1 capsule (100 mg total) by mouth 2 (two) times daily. 05/22/18  05/22/19  Darel Hong, MD  fluticasone (FLONASE) 50 MCG/ACT nasal spray Place 2 sprays into both nostrils daily. 11/16/15   Glean Hess, MD  glucose blood (ONE TOUCH ULTRA TEST) test strip Test twice a day 04/19/18   Glean Hess, MD  Insulin Detemir (LEVEMIR FLEXTOUCH) 100 UNIT/ML Pen Inject 50 Units into the skin daily. Patient taking differently: Inject 22 Units into the skin daily at 10 pm.  12/10/17 04/19/18  Glean Hess, MD  meloxicam (MOBIC) 15 MG tablet Take 1 tablet by mouth daily. 07/16/15    [provider]  metFORMIN (GLUCOPHAGE-XR) 500 MG 24 hr tablet Take 2 tablets (1,000 mg total) by mouth 2 (two) times daily. 07/17/17   Glean Hess, MD  neomycin-polymyxin b-dexamethasone (MAXITROL) 3.5-10000-0.1 SUSP Place 2 drops into both eyes every 6 (six) hours. 11/16/15   Glean Hess, MD  ramipril (ALTACE) 5 MG capsule TAKE 1 BY MOUTH DAILY 11/11/17   Glean Hess, MD  valACYclovir (VALTREX) 1000 MG tablet Take 1 tablet (1,000 mg total) by mouth 3 (three) times daily. Patient not taking: Reported on 04/19/2018 09/01/16   Glean Hess, MD      PHYSICAL EXAMINATION:   VITAL SIGNS: Blood pressure (!) 210/51, pulse (!) 49, temperature 97.8 F (36.6 C), height 5\' 2"  (1.575 m), weight 71.2 kg, SpO2 100 %.  GENERAL:  72 y.o.-year-old patient lying in the bed with no acute distress.  EYES: Pupils equal, round, reactive to light and accommodation. No scleral icterus. Extraocular muscles intact.  HEENT: Head atraumatic, normocephalic. Oropharynx dry and nasopharynx clear.  NECK:  Supple, no jugular venous distention. No thyroid enlargement, no tenderness.  LUNGS: Normal breath sounds bilaterally, no wheezing, rales,rhonchi or crepitation. No use of accessory muscles of respiration.  CARDIOVASCULAR: S1, S2 bradycardia noted. No murmurs, rubs, or gallops.  ABDOMEN: Soft, nontender, nondistended. Bowel sounds present. No organomegaly or mass.  EXTREMITIES: No pedal edema, cyanosis, or clubbing.  NEUROLOGIC: Cranial nerves II through XII are intact. Muscle strength 5/5 in all extremities. Sensation intact. Gait not checked.  PSYCHIATRIC: The patient is alert and oriented x 3.  SKIN: No obvious rash, lesion, or ulcer.   LABORATORY PANEL:   CBC Recent Labs  Lab 07/24/18 1242  WBC 6.3  HGB 11.5*  HCT 33.3*  PLT 248  MCV 87.0  MCH 30.1  MCHC 34.6  RDW 15.1*    ------------------------------------------------------------------------------------------------------------------  Chemistries  Recent Labs  Lab 07/24/18 1242  NA 138  K 4.3  CL 103  CO2 25  GLUCOSE 183*  BUN 15  CREATININE 1.06*  CALCIUM 9.0  MG 1.8  AST 17  ALT 10  ALKPHOS 104  BILITOT 0.7   ------------------------------------------------------------------------------------------------------------------ estimated creatinine clearance is 44.3 mL/min (A) (by C-G formula based on SCr of 1.06 mg/dL (H)). ------------------------------------------------------------------------------------------------------------------ No results for input(s): TSH, T4TOTAL, T3FREE, THYROIDAB in the last 72 hours.  Invalid input(s): FREET3   Coagulation profile No results for input(s): INR, PROTIME in the last 168 hours. ------------------------------------------------------------------------------------------------------------------- No results for input(s): DDIMER in the last 72 hours. -------------------------------------------------------------------------------------------------------------------  Cardiac Enzymes Recent Labs  Lab 07/24/18 1242  TROPONINI 0.04*   ------------------------------------------------------------------------------------------------------------------ Invalid input(s): POCBNP  ---------------------------------------------------------------------------------------------------------------  Urinalysis    Component Value Date/Time   COLORURINE STRAW (A) 04/19/2018 2136   APPEARANCEUR CLEAR (A) 04/19/2018 2136   LABSPEC 1.024 04/19/2018 2136   PHURINE 6.0 04/19/2018 2136   GLUCOSEU 50 (A) 04/19/2018 2136   HGBUR SMALL (A) 04/19/2018 2136   BILIRUBINUR NEGATIVE  04/19/2018 2136   BILIRUBINUR neg 12/10/2017 1059   KETONESUR 5 (A) 04/19/2018 2136   PROTEINUR NEGATIVE 04/19/2018 2136   UROBILINOGEN 0.2 12/10/2017 1059   NITRITE NEGATIVE 04/19/2018 2136    LEUKOCYTESUR NEGATIVE 04/19/2018 2136     RADIOLOGY: No results found.  EKG: Orders placed or performed during the hospital encounter of 07/24/18  . ED EKG  . ED EKG  . EKG 12-Lead  . EKG 12-Lead    IMPRESSION AND PLAN: 72 year old female patient with history of diabetes mellitus type 2, hypertension, hyperlipidemia, seizure disorder presented to the emergency room for nausea vomiting and dizziness  -Bradycardia Hold beta-blocker Monitor heart rate  -Nausea and vomiting Antiemetics  -Dizziness Check orthostatic blood pressure  -Abnormal EKG Cycle troponin and cardiology evaluation Check echocardiogram  -DVT prophylaxis subcu Lovenox daily  All the records are reviewed and case discussed with ED provider. Management plans discussed with the patient, family and they are in agreement.  CODE STATUS:Full code Code Status History    Date Active Date Inactive Code Status Order ID Comments User Context   04/20/2018 0111 04/20/2018 2132 Full Code [id number]  Vickie Epley, MD ED       TOTAL TIME TAKING CARE OF THIS PATIENT: 52 minutes.    Saundra Shelling M.D on 07/24/2018 at 2:53 PM  Between 7am to 6pm - Pager - [phone]  After 6pm go to [email or web address] - password [email or web address] [facility]  [facility]  Office  [phone]  CC: Primary care physician; Glean Hess, MD

## 2018-07-24 NOTE — Progress Notes (Signed)
Dr. Estanislado Pandy made aware of pts blood pressure/ will continue to monitor.

## 2018-07-24 NOTE — Progress Notes (Signed)
Advanced care plan. Purpose of the Encounter: CODE STATUS Parties in Attendance:Patient and family Patient's Decision Capacity:Good Subjective/Patient's story: Patient presented with nausea, vomiting and dizziness Objective/Medical story Has bradycardia and ST depression on ekg Needs cardiology evaluation Goals of care determination:  Advance care directives and goals of care discussed Patient wants everything done for now which includes cpr, intubation and ventilator if need arises CODE STATUS: Full code Time spent discussing advanced care planning: 16 minutes

## 2018-07-24 NOTE — ED Notes (Signed)
Date and time results received: 07/24/18 1405   Test: troponin Critical Value: 0.04  Name of Provider Notified: Alfred Levins MD

## 2018-07-24 NOTE — ED Provider Notes (Signed)
[facility] Emergency Department Provider Note  ____________________________________________  Time seen: Approximately 1:52 PM  I have reviewed the triage vital signs and the nursing notes.   HISTORY  Chief Complaint Emesis   HPI [name] is a 72 y.o. female with a history of colorectal cancer in 2016, diabetes, hypertension, hyperlipidemia, seizure disorder who presents for evaluation of vomiting.  Patient reports being her usual state of health yesterday evening.  This morning she woke up with nausea and had several episodes of nonbloody nonbilious emesis.  She reports taking her morning meds however vomited right after that. She reports lightheadedness and room spinning sensation that started after several episodes of vomiting. She denies constipation or diarrhea, fever or chills, chest pain or shortness of breath, abdominal pain, dysuria or hematuria, HA.  Past Medical History:  Diagnosis Date  . Cancer ([place])   . Diabetes mellitus without complication ([place])   . Hyperlipidemia   . Hypertension   . Seizures ([place])   . Vitamin D deficiency     Patient Active Problem List   Diagnosis Date Noted  . Bradycardia 07/24/2018  . Umbilical hernia with obstruction but no gangrene   . SBO (small bowel obstruction) ([place]) 04/20/2018  . Chronic left hip pain 04/14/2018  . Neoplasm of uncertain behavior of skin 01/16/2017  . 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 Uncontrolled type 2 diabetes mellitus without complication, with long-term current use of insulin ([place]) 05/08/2015  . Essential (primary) hypertension 05/08/2015  . History of rectal cancer 05/08/2015  . Hyperlipidemia associated with type 2 diabetes mellitus ([place]) 05/08/2015  . Arthritis of knee, degenerative 05/08/2015  . Cerebral seizure 05/08/2015  . Shoulder strain 05/08/2015  . Avitaminosis D 05/08/2015    Past Surgical History:  Procedure Laterality Date  . COLOSTOMY REVERSAL  01/2014  .  ILEOSTOMY  05/2013    Prior to Admission medications   Medication Sig Start Date End Date Taking? Authorizing Provider  aspirin 81 MG chewable tablet Chew 81 mg by mouth daily.    Yes [provider]  atorvastatin (LIPITOR) 40 MG tablet Take 1 tablet (40 mg total) by mouth daily. 12/10/17  Yes Glean Hess, MD  bisacodyl (DULCOLAX) 5 MG EC tablet Take 1 tablet (5 mg total) by mouth daily as needed for moderate constipation. 05/22/18 05/22/19 Yes Darel Hong, MD  bisoprolol-hydrochlorothiazide [facility]) 5-6.25 MG tablet TAKE 1 TABLET BY MOUTH DAILY 02/12/18  Yes Glean Hess, MD  calcium carbonate (OSCAL) 1500 (600 Ca) MG TABS tablet Take 600 mg of elemental calcium by mouth daily.   Yes [provider]  carbamazepine (TEGRETOL) 200 MG tablet TAKE 1 TABLET(200 MG) BY MOUTH DAILY Patient taking differently: Take 200 mg by mouth daily.  02/12/18  Yes Glean Hess, MD  Cholecalciferol (D3 HIGH POTENCY) 1000 UNITS capsule Take 1,000 Units by mouth daily.  09/22/14  Yes [provider]  docusate sodium (COLACE) 100 MG capsule Take 1 capsule (100 mg total) by mouth 2 (two) times daily. 05/22/18 05/22/19 Yes Darel Hong, MD  glucose blood (ONE TOUCH ULTRA TEST) test strip Test twice a day 04/19/18  Yes Glean Hess, MD  Insulin Detemir (LEVEMIR FLEXTOUCH) 100 UNIT/ML Pen Inject 50 Units into the skin daily. Patient taking differently: Inject 24 Units into the skin at bedtime.  12/10/17 07/24/18 Yes Glean Hess, MD  metFORMIN (GLUCOPHAGE-XR) 500 MG 24 hr tablet Take 2 tablets (1,000 mg total) by mouth 2 (two) times daily. Patient taking  differently: Take 500 mg by mouth 2 (two) times daily.  07/17/17  Yes Glean Hess, MD  ramipril (ALTACE) 5 MG capsule TAKE 1 BY MOUTH DAILY 11/11/17  Yes Glean Hess, MD    Allergies Pioglitazone  Family History  Problem Relation Age of Onset  . Cancer Mother   . Diabetes Daughter   . Diabetes Son     Social  History Social History   Tobacco Use  . Smoking status: Never Smoker  . Smokeless tobacco: Never Used  Substance Use Topics  . Alcohol use: No    Alcohol/week: 0.0 standard drinks  . Drug use: No    Review of Systems  Constitutional: Negative for fever. Eyes: Negative for visual changes. ENT: Negative for sore throat. Neck: No neck pain  Cardiovascular: Negative for chest pain. Respiratory: Negative for shortness of breath. Gastrointestinal: Negative for abdominal pain or diarrhea. + nausea and vomiting Genitourinary: Negative for dysuria. Musculoskeletal: Negative for back pain. Skin: Negative for rash. Neurological: Negative for headaches, weakness or numbness. + vertigo Psych: No SI or HI  ____________________________________________   PHYSICAL EXAM:  VITAL SIGNS: ED Triage Vitals  Enc Vitals Group     BP 07/24/18 1253 (!) 210/51     Pulse Rate 07/24/18 1253 (!) 49     Resp --      Temp 07/24/18 1253 97.8 F (36.6 C)     Temp src --      SpO2 07/24/18 1253 100 %     Weight 07/24/18 1244 157 lb (71.2 kg)     Height 07/24/18 1244 5\' 2"  (1.575 m)     Head Circumference --      Peak Flow --      Pain Score 07/24/18 1244 0     Pain Loc --      Pain Edu? --      Excl. in [place]? --     Constitutional: Alert and oriented. Well appearing and in no apparent distress. HEENT:      Head: Normocephalic and atraumatic.         Eyes: Conjunctivae are normal. Sclera is non-icteric.       Mouth/Throat: Mucous membranes are moist.       Neck: Supple with no signs of meningismus. Cardiovascular: Bradycardic with regular rhythm. No murmurs, gallops, or rubs. 2+ symmetrical distal pulses are present in all extremities. No JVD. Respiratory: Normal respiratory effort. Lungs are clear to auscultation bilaterally. No wheezes, crackles, or rhonchi.  Gastrointestinal: Soft, non tender, and non distended with positive bowel sounds. No rebound or guarding. Musculoskeletal: Nontender  with normal range of motion in all extremities. No edema, cyanosis, or erythema of extremities. Neurologic: Normal speech and language. A & O x3, PERRL, EOMI, no nystagmus, CN II-XII intact, motor testing reveals good tone and bulk throughout. There is no evidence of pronator drift or dysmetria. Muscle strength is 5/5 throughout.  Sensory examination is intact. Gait deferred. Skin: Skin is warm, dry and intact. No rash noted. Psychiatric: Mood and affect are normal. Speech and behavior are normal.  ____________________________________________   LABS (all labs ordered are listed, but only abnormal results are displayed)  Labs Reviewed  COMPREHENSIVE METABOLIC PANEL - Abnormal; Notable for the following components:      Result Value   Glucose, Bld 183 (*)    Creatinine, Ser 1.06 (*)    GFR calc non Af Amer 51 (*)    GFR calc Af Amer 59 (*)    All other  components within normal limits  CBC - Abnormal; Notable for the following components:   Hemoglobin 11.5 (*)    HCT 33.3 (*)    RDW 15.1 (*)    All other components within normal limits  URINALYSIS, COMPLETE (UACMP) WITH MICROSCOPIC - Abnormal; Notable for the following components:   Color, Urine YELLOW (*)    APPearance CLEAR (*)    All other components within normal limits  TROPONIN I - Abnormal; Notable for the following components:   Troponin I 0.04 (*)    All other components within normal limits  GLUCOSE, CAPILLARY - Abnormal; Notable for the following components:   Glucose-Capillary 154 (*)    All other components within normal limits  LIPASE, BLOOD  MAGNESIUM   ____________________________________________  EKG  ED ECG REPORT I, Rudene Re, the attending physician, personally viewed and interpreted this ECG.  Sinus bradycardia, rate of 46, normal intervals, low voltage QRS, ST depression on inferior and lateral leads with no ST elevation.  EKG is unchanged from prior from June  2019 ____________________________________________  RADIOLOGY  I have personally reviewed the images performed during this visit and I agree with the Radiologist's read.   Interpretation by Radiologist:  No results found.    ____________________________________________   PROCEDURES  Procedure(s) performed: None Procedures Critical Care performed: yes  CRITICAL CARE Performed by: Rudene Re  ?  Total critical care time: 30 min  Critical care time was exclusive of separately billable procedures and treating other patients.  Critical care was necessary to treat or prevent imminent or life-threatening deterioration.  Critical care was time spent personally by me on the following activities: development of treatment plan with patient and/or surrogate as well as nursing, discussions with consultants, evaluation of patient's response to treatment, examination of patient, obtaining history from patient or surrogate, ordering and performing treatments and interventions, ordering and review of laboratory studies, ordering and review of radiographic studies, pulse oximetry and re-evaluation of patient's condition.  ____________________________________________   INITIAL IMPRESSION / ASSESSMENT AND PLAN / ED COURSE   72 y.o. female with a history of colorectal cancer in 2016, diabetes, hypertension, hyperlipidemia, seizure disorder who presents for evaluation of vomiting.  Patient is well-appearing and in no distress, her blood pressure is 210/51 however patient vomited her BP meds this morning.  Heart rate is bradycardic but sinus.  Review of epic shows the patient's heart rate is usually between 41s and 50s.  Patient is currently on bisoprolol as 1 of her antihypertensive medications.  I will discontinue that medication at this time.  EKG is unchanged from baseline.  Patient is neurologically intact with no headache and no clinical signs of stroke or subarachnoid hemorrhage however  due to vertigo and elevated BP, will get a head CT to rule out stroke.  Her abdomen is soft with no tenderness throughout and she has been having normal bowel movements, it is nondistended with no signs or symptoms of SBO or any intra-abdominal process.  Will give Zofran and fluids, will give her p.o. blood pressure medications, will check electrolytes and labs to rule out AKI, dehydration, UTI.      As part of my medical decision making, I reviewed the following data within the [facility] notes reviewed and incorporated, Labs reviewed , EKG interpreted , Old EKG reviewed, Old chart reviewed, Radiograph reviewed , Discussed with admitting physician , Notes from prior ED visits and [place] Controlled Substance Database    Pertinent labs & imaging results that  were available during my care of the patient were reviewed by me and considered in my medical decision making (see chart for details).    ____________________________________________   FINAL CLINICAL IMPRESSION(S) / ED DIAGNOSES  Final diagnoses:  Nausea and vomiting, intractability of vomiting not specified, unspecified vomiting type  Hypertension, unspecified type  Bradycardia  NSTEMI (non-ST elevated myocardial infarction) ([place])  Vertigo      NEW MEDICATIONS STARTED DURING THIS VISIT:  ED Discharge Orders    None       Note:  This document was prepared using Dragon voice recognition software and may include unintentional dictation errors.    Alfred Levins, [place], MD 07/24/18 1535

## 2018-07-24 NOTE — ED Triage Notes (Signed)
Pt arrived via [facility]. Primary complaint of dizziness and vomiting beginning at approx 1030 this morning. EMS reported vitals of 122 systolic, EKG sinus brady. Pt given peripheral IV 20g to R AC. Given 4mg  zofran in route. Pt Hx of HTN, DBM, and vertigo.

## 2018-07-25 ENCOUNTER — Observation Stay
Admit: 2018-07-25 | Discharge: 2018-07-25 | Disposition: A | Discharge status: Home / Self Care | Payer: Medicare Other | Attending: Internal Medicine | Admitting: Internal Medicine

## 2018-07-25 DIAGNOSIS — R42 Dizziness and giddiness: Secondary | ICD-10-CM | POA: Diagnosis not present

## 2018-07-25 DIAGNOSIS — R001 Bradycardia, unspecified: Secondary | ICD-10-CM | POA: Diagnosis not present

## 2018-07-25 DIAGNOSIS — R9431 Abnormal electrocardiogram [ECG] [EKG]: Secondary | ICD-10-CM | POA: Diagnosis not present

## 2018-07-25 DIAGNOSIS — R112 Nausea with vomiting, unspecified: Secondary | ICD-10-CM | POA: Diagnosis not present

## 2018-07-25 LAB — BASIC METABOLIC PANEL
ANION GAP: 6 (ref 5–15)
BUN: 14 mg/dL (ref 8–23)
CALCIUM: 8.7 mg/dL — AB (ref 8.9–10.3)
CO2: 27 mmol/L (ref 22–32)
Chloride: 106 mmol/L (ref 98–111)
Creatinine, Ser: 1.05 mg/dL — ABNORMAL HIGH (ref 0.44–1.00)
GFR, EST AFRICAN AMERICAN: 60 mL/min — AB (ref >60)
GFR, EST NON AFRICAN AMERICAN: 52 mL/min — AB (ref >60)
GLUCOSE: 136 mg/dL — AB (ref 70–99)
Potassium: 4.5 mmol/L (ref 3.5–5.1)
SODIUM: 139 mmol/L (ref 135–145)

## 2018-07-25 LAB — LIPID PANEL
CHOLESTEROL: 177 mg/dL (ref 0–200)
HDL: 66 mg/dL (ref >40)
LDL Cholesterol: 87 mg/dL (ref 0–99)
TRIGLYCERIDES: 121 mg/dL (ref <150)
Total CHOL/HDL Ratio: 2.7 RATIO
VLDL: 24 mg/dL (ref 0–40)

## 2018-07-25 LAB — GLUCOSE, CAPILLARY
GLUCOSE-CAPILLARY: 193 mg/dL — AB (ref 70–99)
GLUCOSE-CAPILLARY: 217 mg/dL — AB (ref 70–99)
Glucose-Capillary: 129 mg/dL — ABNORMAL HIGH (ref 70–99)
Glucose-Capillary: 143 mg/dL — ABNORMAL HIGH (ref 70–99)

## 2018-07-25 LAB — CBC
HEMATOCRIT: 32.9 % — AB (ref 35.0–47.0)
Hemoglobin: 11 g/dL — ABNORMAL LOW (ref 12.0–16.0)
MCH: 29.3 pg (ref 26.0–34.0)
MCHC: 33.4 g/dL (ref 32.0–36.0)
MCV: 87.7 fL (ref 80.0–100.0)
Platelets: 260 10*3/uL (ref 150–440)
RBC: 3.75 MIL/uL — ABNORMAL LOW (ref 3.80–5.20)
RDW: 15.2 % — AB (ref 11.5–14.5)
WBC: 7.1 10*3/uL (ref 3.6–11.0)

## 2018-07-25 LAB — TROPONIN I
TROPONIN I: 0.07 ng/mL — AB (ref <0.03)
Troponin I: 0.1 ng/mL (ref <0.03)

## 2018-07-25 LAB — ECHOCARDIOGRAM COMPLETE
Height: 62 in
Weight: 2481.6 oz

## 2018-07-25 NOTE — Plan of Care (Signed)
  Problem: Activity: Goal: Risk for activity intolerance will decrease Outcome: Progressing   Problem: Nutrition: Goal: Adequate nutrition will be maintained Outcome: Progressing   Problem: Pain Managment: Goal: General experience of comfort will improve Outcome: Progressing Note:  No complaints of pain   Problem: Safety: Goal: Ability to remain free from injury will improve Outcome: Progressing Note:  Up to bathroom with standby assist, tolerating well   Problem: Activity: Goal: Ability to tolerate increased activity will improve Outcome: Progressing   Problem: Cardiac: Goal: Ability to achieve and maintain adequate cardiopulmonary perfusion will improve Outcome: Progressing Note:  Pt has been in sinus rhythm , brady 50-60's   Problem: Education: Goal: Knowledge of General Education information will improve Description Including pain rating scale, medication(s)/side effects and non-pharmacologic comfort measures Outcome: Completed/Met

## 2018-07-25 NOTE — Consult Note (Signed)
[facility] Cardiology  CARDIOLOGY CONSULT NOTE  Patient ID: SUI KASPAREK MRN: [id number] DOB/AGE: Jan 27, 1946 72 y.o.  Admit date: 07/24/2018 Referring Physician Bridgett Larsson Primary Physician [facility] Primary Cardiologist  Reason for Consultation bradycardia, elevated troponin  HPI: 72 year old female referred for evaluation of bradycardia, and elevated troponin.  She presents to [facility] emergency room, with recent history of vertigo, and lightheadedness, with nausea and vomiting.  In the emergency room, patient was noted to be bradycardic.  ECG revealed sinus bradycardia rate of 46 bpm.  Patient is on bisoprolol, which has been held.  Heart rate has improved, telemetry reveals sinus bradycardia rate of 57 bpm.  Review of the chart shows that the patient has baseline sinus bradycardia, ECG revealing sinus bradycardia rate of 51 bpm on 04/19/2018.  The patient denies chest pain.  Admission labs notable for troponin of 0.04, follow troponins of 0.16, 0.1, and 0.07.  Review of systems complete and found to be negative unless listed above     Past Medical History:  Diagnosis Date  . Cancer ([place])   . Diabetes mellitus without complication ([place])   . Hyperlipidemia   . Hypertension   . Seizures ([place])   . Vitamin D deficiency     Past Surgical History:  Procedure Laterality Date  . COLOSTOMY REVERSAL  01/2014  . ILEOSTOMY  05/2013    Medications Prior to Admission  Medication Sig Dispense Refill Last Dose  . aspirin 81 MG chewable tablet Chew 81 mg by mouth daily.    07/23/2018 at 0800  . atorvastatin (LIPITOR) 40 MG tablet Take 1 tablet (40 mg total) by mouth daily. 90 tablet 3 07/23/2018 at 0800  . bisacodyl (DULCOLAX) 5 MG EC tablet Take 1 tablet (5 mg total) by mouth daily as needed for moderate constipation. 30 tablet 0 Past Month at PRN  . bisoprolol-hydrochlorothiazide (ZIAC) 5-6.25 MG tablet TAKE 1 TABLET BY MOUTH DAILY 90 tablet 3 07/23/2018 at 0800  . calcium carbonate (OSCAL) 1500 (600 Ca) MG TABS  tablet Take 600 mg of elemental calcium by mouth daily.   07/23/2018 at 0800  . carbamazepine (TEGRETOL) 200 MG tablet TAKE 1 TABLET(200 MG) BY MOUTH DAILY (Patient taking differently: Take 200 mg by mouth daily. ) 90 tablet 3 07/23/2018 at 0800  . Cholecalciferol (D3 HIGH POTENCY) 1000 UNITS capsule Take 1,000 Units by mouth daily.    07/23/2018 at 0800  . docusate sodium (COLACE) 100 MG capsule Take 1 capsule (100 mg total) by mouth 2 (two) times daily. 60 capsule 0   . glucose blood (ONE TOUCH ULTRA TEST) test strip Test twice a day 200 each 2 04/19/2018 at Unknown time  . Insulin Detemir (LEVEMIR FLEXTOUCH) 100 UNIT/ML Pen Inject 50 Units into the skin daily. (Patient taking differently: Inject 24 Units into the skin at bedtime. ) 30 mL 3 07/23/2018 at 2100  . metFORMIN (GLUCOPHAGE-XR) 500 MG 24 hr tablet Take 2 tablets (1,000 mg total) by mouth 2 (two) times daily. (Patient taking differently: Take 500 mg by mouth 2 (two) times daily. ) 360 tablet 3 07/23/2018 at 1800  . ramipril (ALTACE) 5 MG capsule TAKE 1 BY MOUTH DAILY 90 capsule 3 07/23/2018 at 0800   Social History   Socioeconomic History  . Marital status: Married    Spouse name: Not on file  . Number of children: Not on file  . Years of education: Not on file  . Highest education level: Not on file  Occupational History  . Not on file  Social Needs  . Financial resource strain: Not on file  . Food insecurity:    Worry: Not on file    Inability: Not on file  . Transportation needs:    Medical: Not on file    Non-medical: Not on file  Tobacco Use  . Smoking status: Never Smoker  . Smokeless tobacco: Never Used  Substance and Sexual Activity  . Alcohol use: No    Alcohol/week: 0.0 standard drinks  . Drug use: No  . Sexual activity: Never  Lifestyle  . Physical activity:    Days per week: Not on file    Minutes per session: Not on file  . Stress: Not on file  Relationships  . Social connections:    Talks on phone: Not on file     Gets together: Not on file    Attends religious service: Not on file    Active member of club or organization: Not on file    Attends meetings of clubs or organizations: Not on file    Relationship status: Not on file  . Intimate partner violence:    Fear of current or ex partner: Not on file    Emotionally abused: Not on file    Physically abused: Not on file    Forced sexual activity: Not on file  Other Topics Concern  . Not on file  Social History Narrative  . Not on file    Family History  Problem Relation Age of Onset  . Cancer Mother   . Diabetes Daughter   . Diabetes Son       Review of systems complete and found to be negative unless listed above      PHYSICAL EXAM  General: Well developed, well nourished, in no acute distress HEENT:  Normocephalic and atramatic Neck:  No JVD.  Lungs: Clear bilaterally to auscultation and percussion. Heart: HRRR . Normal S1 and S2 without gallops or murmurs.  Abdomen: Bowel sounds are positive, abdomen soft and non-tender  Msk:  Back normal, normal gait. Normal strength and tone for age. Extremities: No clubbing, cyanosis or edema.   Neuro: Alert and oriented X 3. Psych:  Good affect, responds appropriately  Labs:   Lab Results  Component Value Date   WBC 7.1 07/25/2018   HGB 11.0 (L) 07/25/2018   HCT 32.9 (L) 07/25/2018   MCV 87.7 07/25/2018   PLT 260 07/25/2018    Recent Labs  Lab 07/24/18 1242 07/25/18 0530  NA 138 139  K 4.3 4.5  CL 103 106  CO2 25 27  BUN 15 14  CREATININE 1.06* 1.05*  CALCIUM 9.0 8.7*  PROT 6.8  --   BILITOT 0.7  --   ALKPHOS 104  --   ALT 10  --   AST 17  --   GLUCOSE 183* 136*   Lab Results  Component Value Date   TROPONINI 0.07 (HH) 07/25/2018    Lab Results  Component Value Date   CHOL 177 07/25/2018   CHOL 201 (H) 12/10/2017   CHOL 197 07/17/2017   Lab Results  Component Value Date   HDL 66 07/25/2018   HDL 72 12/10/2017   HDL 72 07/17/2017   Lab Results   Component Value Date   LDLCALC 87 07/25/2018   LDLCALC 112 (H) 12/10/2017   LDLCALC 106 (H) 07/17/2017   Lab Results  Component Value Date   TRIG 121 07/25/2018   TRIG 84 12/10/2017   TRIG 97 07/17/2017   Lab Results  Component Value Date   CHOLHDL 2.7 07/25/2018   CHOLHDL 2.8 12/10/2017   CHOLHDL 2.7 07/17/2017   No results found for: LDLDIRECT    Radiology: Ct Head Wo Contrast  Result Date: 07/24/2018 CLINICAL DATA:  72 year old female with acute dizziness and vomiting today. EXAM: CT HEAD WITHOUT CONTRAST TECHNIQUE: Contiguous axial images were obtained from the base of the skull through the vertex without intravenous contrast. COMPARISON:  05/13/2006 CT FINDINGS: Brain: No evidence of acute infarction, hemorrhage, hydrocephalus, extra-axial collection or mass lesion/mass effect. Minimal chronic small-vessel white matter ischemic changes and RIGHT posterior parietal encephalomalacia again identified. Vascular: Atherosclerotic calcifications again noted. Skull: Normal. Negative for fracture or focal lesion. Sinuses/Orbits: No acute abnormality Other: None IMPRESSION: 1. No evidence of acute intracranial abnormality 2. Mild chronic small-vessel white matter ischemic changes and RIGHT parietal encephalomalacia. Electronically Signed   By: Margarette Canada M.D.   On: 07/24/2018 15:50    EKG: Sinus bradycardia  ASSESSMENT AND PLAN:   1.  Sinus bradycardia, improved after holding bisoprolol 2.  Borderline elevated troponin, of uncertain clinical significance in the absence of chest pain or ECG changes  Recommendations  1.  Continue current medications 2.  Continue to hold bisoprolol 3.  No indication at this time for permanent pacemaker 4.  Review 2D echocardiogram 5.  Lexiscan Myoview in a.m.  Signed: Isaias Cowman MD,PhD, [facility] 07/25/2018, 9:59 AM

## 2018-07-25 NOTE — Progress Notes (Signed)
[facility] [place] NAME: [name]    MR#:  [id number]  DATE OF BIRTH:  [birth year]  SUBJECTIVE:  CHIEF COMPLAINT:   Chief Complaint  Patient presents with  . Emesis   No chest pain, nausea or vomiting. REVIEW OF SYSTEMS:  Review of Systems  Constitutional: Negative for chills, fever and malaise/fatigue.  HENT: Negative for sore throat.   Eyes: Negative for blurred vision and double vision.  Respiratory: Negative for cough, hemoptysis, shortness of breath, wheezing and stridor.   Cardiovascular: Negative for chest pain, palpitations, orthopnea and leg swelling.  Gastrointestinal: Negative for abdominal pain, blood in stool, diarrhea, melena, nausea and vomiting.  Genitourinary: Negative for dysuria, flank pain and hematuria.  Musculoskeletal: Negative for back pain and joint pain.  Skin: Negative for rash.  Neurological: Negative for dizziness, sensory change, focal weakness, seizures, loss of consciousness, weakness and headaches.  Endo/Heme/Allergies: Negative for polydipsia.  Psychiatric/Behavioral: Negative for depression. The patient is not nervous/anxious.     DRUG ALLERGIES:   Allergies  Allergen Reactions  . Pioglitazone Nausea Only   VITALS:  Blood pressure 107/73, pulse 69, temperature 99.1 F (37.3 C), temperature source Oral, resp. rate 18, height 5\' 2"  (1.575 m), weight 70.4 kg, SpO2 94 %. PHYSICAL EXAMINATION:  Physical Exam  Constitutional: She is oriented to person, place, and time. She appears well-developed.  HENT:  Head: Normocephalic.  Mouth/Throat: Oropharynx is clear and moist.  Eyes: Pupils are equal, round, and reactive to light. Conjunctivae and EOM are normal. No scleral icterus.  Neck: Normal range of motion. Neck supple. No JVD present. No tracheal deviation present.  Cardiovascular: Normal rate, regular rhythm and normal heart sounds. Exam reveals no gallop.  No murmur  heard. Pulmonary/Chest: Effort normal and breath sounds normal. No respiratory distress. She has no wheezes. She has no rales.  Abdominal: Soft. Bowel sounds are normal. She exhibits no distension. There is no tenderness. There is no rebound.  Musculoskeletal: Normal range of motion. She exhibits no edema or tenderness.  Neurological: She is alert and oriented to person, place, and time. No cranial nerve deficit.  Skin: No rash noted. No erythema.  Psychiatric: She has a normal mood and affect.   LABORATORY PANEL:  Female CBC Recent Labs  Lab 07/25/18 0530  WBC 7.1  HGB 11.0*  HCT 32.9*  PLT 260   ------------------------------------------------------------------------------------------------------------------ Chemistries  Recent Labs  Lab 07/24/18 1242 07/25/18 0530  NA 138 139  K 4.3 4.5  CL 103 106  CO2 25 27  GLUCOSE 183* 136*  BUN 15 14  CREATININE 1.06* 1.05*  CALCIUM 9.0 8.7*  MG 1.8  --   AST 17  --   ALT 10  --   ALKPHOS 104  --   BILITOT 0.7  --    RADIOLOGY:  Ct Head Wo Contrast  Result Date: 07/24/2018 CLINICAL DATA:  72 year old female with acute dizziness and vomiting today. EXAM: CT HEAD WITHOUT CONTRAST TECHNIQUE: Contiguous axial images were obtained from the base of the skull through the vertex without intravenous contrast. COMPARISON:  05/13/2006 CT FINDINGS: Brain: No evidence of acute infarction, hemorrhage, hydrocephalus, extra-axial collection or mass lesion/mass effect. Minimal chronic small-vessel white matter ischemic changes and RIGHT posterior parietal encephalomalacia again identified. Vascular: Atherosclerotic calcifications again noted. Skull: Normal. Negative for fracture or focal lesion. Sinuses/Orbits: No acute abnormality Other: None IMPRESSION: 1. No evidence of acute intracranial abnormality 2. Mild chronic small-vessel white matter ischemic changes and  RIGHT parietal encephalomalacia. Electronically Signed   By: Margarette Canada M.D.   On:  07/24/2018 15:50   ASSESSMENT AND PLAN:   72 year old female patient with history of diabetes mellitus type 2, hypertension, hyperlipidemia, seizure disorder presented to the emergency room for nausea vomiting and dizziness  -Bradycardia Hold beta-blocker, improved.  -Nausea and vomiting, improved. Antiemetics PRN.  -Dizziness Improved.  Elevated troponin and abnormal EKG Continue aspirin and Lipitor. Stress test tomorrow per Dr. Saralyn Pilar.  Diabetes.  Continue Levemir and sliding scale. Hypertension.  Continue Altace and hydralazine.  All the records are reviewed and case discussed with Care Management/Social Worker. Management plans discussed with the patient, family and they are in agreement.  CODE STATUS: Full Code  TOTAL TIME TAKING CARE OF THIS PATIENT: 35 minutes.   More than 50% of the time was spent in counseling/coordination of care: YES  POSSIBLE D/C IN 1-2 DAYS, DEPENDING ON CLINICAL CONDITION.   Demetrios Loll M.D on 07/25/2018 at 2:40 PM  Between 7am to 6pm - Pager - [phone]  After 6pm go to [email or web address] - [facility]

## 2018-07-26 ENCOUNTER — Observation Stay: Payer: Medicare Other

## 2018-07-26 DIAGNOSIS — R42 Dizziness and giddiness: Secondary | ICD-10-CM | POA: Diagnosis not present

## 2018-07-26 DIAGNOSIS — R748 Abnormal levels of other serum enzymes: Secondary | ICD-10-CM | POA: Diagnosis not present

## 2018-07-26 DIAGNOSIS — R001 Bradycardia, unspecified: Secondary | ICD-10-CM | POA: Diagnosis not present

## 2018-07-26 DIAGNOSIS — I200 Unstable angina: Secondary | ICD-10-CM | POA: Diagnosis not present

## 2018-07-26 DIAGNOSIS — R9431 Abnormal electrocardiogram [ECG] [EKG]: Secondary | ICD-10-CM | POA: Diagnosis not present

## 2018-07-26 DIAGNOSIS — R112 Nausea with vomiting, unspecified: Secondary | ICD-10-CM | POA: Diagnosis not present

## 2018-07-26 LAB — NM MYOCAR MULTI W/SPECT W/WALL MOTION / EF
CHL CUP MPHR: 148 {beats}/min
CHL CUP NUCLEAR SDS: 1
CHL CUP NUCLEAR SSS: 4
CHL CUP RESTING HR STRESS: 54 {beats}/min
CSEPED: 1 min
CSEPEW: 1 METS
CSEPPHR: 98 {beats}/min
Exercise duration (sec): 0 s
LV dias vol: 48 mL (ref 46–106)
LV sys vol: 20 mL
Percent HR: 66 %
SRS: 3
TID: 0.73

## 2018-07-26 LAB — GLUCOSE, CAPILLARY
GLUCOSE-CAPILLARY: 71 mg/dL (ref 70–99)
Glucose-Capillary: 113 mg/dL — ABNORMAL HIGH (ref 70–99)

## 2018-07-26 MED ORDER — HYDRALAZINE HCL 25 MG PO TABS: 25.0000 mg | ORAL_TABLET | Freq: Three times a day (TID) | ORAL | 0 refills | Status: DC

## 2018-07-26 MED ORDER — REGADENOSON 0.4 MG/5ML IV SOLN
0.4000 mg | Freq: Once | INTRAVENOUS | Status: AC
  Administered 2018-07-26: 0.4 mg via INTRAVENOUS

## 2018-07-26 MED ORDER — NITROGLYCERIN 0.4 MG SL SUBL: 0.4000 mg | SUBLINGUAL_TABLET | SUBLINGUAL | 2 refills | Status: AC | PRN

## 2018-07-26 MED ORDER — TECHNETIUM TC 99M TETROFOSMIN IV KIT
31.5300 | PACK | Freq: Once | INTRAVENOUS | Status: AC | PRN
  Administered 2018-07-26: 31.53 via INTRAVENOUS

## 2018-07-26 MED ORDER — TECHNETIUM TC 99M TETROFOSMIN IV KIT
12.3500 | PACK | Freq: Once | INTRAVENOUS | Status: AC | PRN
  Administered 2018-07-26: 12.35 via INTRAVENOUS

## 2018-07-26 MED ORDER — HYDROCHLOROTHIAZIDE 12.5 MG PO CAPS: 12.5000 mg | ORAL_CAPSULE | Freq: Every day | ORAL | 0 refills | Status: DC

## 2018-07-26 NOTE — Progress Notes (Signed)
[facility] Cardiology  SUBJECTIVE: The patient denies recurrent chest pain or shortness of breath. She does report abdominal pressure and cramping as if she needs to have a bowel movement, but she is unable to after multiple attempts. She is currently drinking warm prune juice.   Vitals:   07/26/18 0817 07/26/18 1020 07/26/18 1045 07/26/18 1149  BP: (!) 146/54 (!) 185/152 (!) 171/55 (!) 141/47  Pulse: (!) 57   70  Resp: 18   18  Temp: 98 F (36.7 C)     TempSrc: Oral     SpO2: 97%   100%  Weight:      Height:         Intake/Output Summary (Last 24 hours) at 07/26/2018 1345 Last data filed at 07/26/2018 0424 Gross per 24 hour  Intake 1310.87 ml  Output 950 ml  Net 360.87 ml      PHYSICAL EXAM  General: Well developed, well nourished, in no acute distress, though in obvious discomfort, sitting on side of bed attempting to eat lunch HEENT:  Normocephalic and atramatic Neck:  No JVD.  Lungs: Clear bilaterally to auscultation , normal effort of breathing Heart: HRRR . Normal S1 and S2 without gallops or murmurs.  Abdomen: Bowel sounds are positive, abdomen soft, no guarding, no rebound Msk:  Back normal, sitting on side of bed. Normal strength and tone for age. Extremities: No clubbing, cyanosis or edema.   Neuro: Alert and oriented X 3. Psych:  Good affect, responds appropriately   LABS: Basic Metabolic Panel: Recent Labs    07/24/18 1242 07/25/18 0530  NA 138 139  K 4.3 4.5  CL 103 106  CO2 25 27  GLUCOSE 183* 136*  BUN 15 14  CREATININE 1.06* 1.05*  CALCIUM 9.0 8.7*  MG 1.8  --    Liver Function Tests: Recent Labs    07/24/18 1242  AST 17  ALT 10  ALKPHOS 104  BILITOT 0.7  PROT 6.8  ALBUMIN 3.9   Recent Labs    07/24/18 1242  LIPASE 26   CBC: Recent Labs    07/24/18 1242 07/25/18 0530  WBC 6.3 7.1  HGB 11.5* 11.0*  HCT 33.3* 32.9*  MCV 87.0 87.7  PLT 248 260   Cardiac Enzymes: Recent Labs    07/24/18 1757 07/24/18 2318 07/25/18 0530   TROPONINI 0.16* 0.10* 0.07*   BNP: Invalid input(s): POCBNP D-Dimer: No results for input(s): DDIMER in the last 72 hours. Hemoglobin A1C: No results for input(s): HGBA1C in the last 72 hours. Fasting Lipid Panel: Recent Labs    07/25/18 0530  CHOL 177  HDL 66  LDLCALC 87  TRIG 121  CHOLHDL 2.7   Thyroid Function Tests: No results for input(s): TSH, T4TOTAL, T3FREE, THYROIDAB in the last 72 hours.  Invalid input(s): FREET3 Anemia Panel: No results for input(s): VITAMINB12, FOLATE, FERRITIN, TIBC, IRON, RETICCTPCT in the last 72 hours.  Ct Head Wo Contrast  Result Date: 07/24/2018 CLINICAL DATA:  72 year old female with acute dizziness and vomiting today. EXAM: CT HEAD WITHOUT CONTRAST TECHNIQUE: Contiguous axial images were obtained from the base of the skull through the vertex without intravenous contrast. COMPARISON:  05/13/2006 CT FINDINGS: Brain: No evidence of acute infarction, hemorrhage, hydrocephalus, extra-axial collection or mass lesion/mass effect. Minimal chronic small-vessel white matter ischemic changes and RIGHT posterior parietal encephalomalacia again identified. Vascular: Atherosclerotic calcifications again noted. Skull: Normal. Negative for fracture or focal lesion. Sinuses/Orbits: No acute abnormality Other: None IMPRESSION: 1. No evidence of acute intracranial abnormality  2. Mild chronic small-vessel white matter ischemic changes and RIGHT parietal encephalomalacia. Electronically Signed   By: Margarette Canada M.D.   On: 07/24/2018 15:50   Nm Myocar Multi W/spect W/wall Motion / Ef  Result Date: 07/26/2018  Blood pressure demonstrated a normal response to exercise.  The study is normal.  This is a low risk study.  The left ventricular ejection fraction is hyperdynamic (>65%).      Echo EF 55-65%, with grade 2 diastolic dysfunction  TELEMETRY: Normal sinus rhythm, 74 bpm  ASSESSMENT AND PLAN:  Active Problems:   Bradycardia    1. Sinus bradycardia,  improved after holding bisoprolol. Now in sinus rhythm, rate 74 bpm 2. Borderline elevated troponin, with Lexiscan Myoview negative for evidence of scar or ischemia 3. Essential hypertension, systolic blood pressure mildly elevated 4. Hyperlipidemia, on atorvastatin 5. Abdominal pain, reports that she has a history of this as well as multiple colon surgeries  Recommendations: 1. Continue to hold beta blocker 2. No further cardiac diagnostics recommended 3. Continue ramipril, hydrochlorothiazide, hydralazine, and atorvastatin 4. If the patient remains stable, she may be discharged from a cardiac standpoint, with 1-week follow-up as outpatient with Dr. Saralyn Pilar.  Clabe Seal, PA-C 07/26/2018 1:45 PM   Sign off for now; call with any questions.

## 2018-07-26 NOTE — Plan of Care (Signed)
  Problem: Clinical Measurements: Goal: Respiratory complications will improve Outcome: Progressing   Problem: Activity: Goal: Risk for activity intolerance will decrease Outcome: Progressing Note:  Up to bathroom with standby assist, tolerating well   Problem: Pain Managment: Goal: General experience of comfort will improve Outcome: Progressing Note:  No complaints of pain this shift   Problem: Safety: Goal: Ability to remain free from injury will improve Outcome: Progressing   Problem: Skin Integrity: Goal: Risk for impaired skin integrity will decrease Outcome: Progressing   Problem: Activity: Goal: Ability to tolerate increased activity will improve Outcome: Progressing   Problem: Cardiac: Goal: Ability to achieve and maintain adequate cardiopulmonary perfusion will improve Outcome: Progressing   Problem: Health Behavior/Discharge Planning: Goal: Ability to manage health-related needs will improve Outcome: Completed/Met

## 2018-07-26 NOTE — Progress Notes (Signed)
Pt complaining of abd bloating / pt states " it feels like something sitting on top of my belly and I need to use the bathroom"/ Dr. Bridgett Larsson made aware/ prune juice and med given/ pt passing gas and states she feel much better/ discharge instructions explained to pt and pts daughter/ iv and tele removed/ RX given to pt/ will transport off unit via wheelchair.

## 2018-07-26 NOTE — Care Management (Signed)
No discharge needs identified by members of the care team 

## 2018-07-26 NOTE — Discharge Summary (Signed)
[facility] [place] NAME: [name]    MR#:  [id number]  DATE OF BIRTH:  02/27/1946  DATE OF ADMISSION:  07/24/2018   ADMITTING PHYSICIAN: Saundra Shelling, MD  DATE OF DISCHARGE:  07/26/2018 PRIMARY CARE PHYSICIAN: Glean Hess, MD   ADMISSION DIAGNOSIS:  Bradycardia [R00.1] Vertigo [R42] NSTEMI (non-ST elevated myocardial infarction) ([place]) [I21.4] Nausea and vomiting, intractability of vomiting not specified, unspecified vomiting type [R11.2] Hypertension, unspecified type [I10] DISCHARGE DIAGNOSIS:  Active Problems:   Bradycardia  SECONDARY DIAGNOSIS:   Past Medical History:  Diagnosis Date  . Cancer ([place])   . Diabetes mellitus without complication ([place])   . Hyperlipidemia   . Hypertension   . Seizures ([place])   . Vitamin D deficiency    HOSPITAL COURSE:  71 year old female patient with history of diabetes mellitus type 2, hypertension, hyperlipidemia, seizure disorder presented to the emergency room for nausea vomiting and dizziness  -Bradycardia Hold beta-blocker, improved.  -Nausea and vomiting, improved. Antiemetics PRN.  -Dizziness Improved.  Elevated troponin and abnormal EKG Continue aspirin and Lipitor. Lexiscan Myoview negative for evidence of scar or ischemia.  The patient can be discharged per cardiologist.  Diabetes.  Continue Levemir and sliding scale. Hypertension.  Continue Altace, HCTZ and hydralazine. DISCHARGE CONDITIONS:  Stable, discharge to home today. CONSULTS OBTAINED:  Treatment Team:  Isaias Cowman, MD DRUG ALLERGIES:   Allergies  Allergen Reactions  . Pioglitazone Nausea Only   DISCHARGE MEDICATIONS:   Allergies as of 07/26/2018      Reactions   Pioglitazone Nausea Only      Medication List    STOP taking these medications   bisoprolol-hydrochlorothiazide 5-6.25 MG tablet Commonly known as:  ZIAC     TAKE these medications   aspirin 81 MG chewable  tablet Chew 81 mg by mouth daily.   atorvastatin 40 MG tablet Commonly known as:  LIPITOR Take 1 tablet (40 mg total) by mouth daily.   bisacodyl 5 MG EC tablet Commonly known as:  DULCOLAX Take 1 tablet (5 mg total) by mouth daily as needed for moderate constipation.   calcium carbonate 1500 (600 Ca) MG Tabs tablet Commonly known as:  OSCAL Take 600 mg of elemental calcium by mouth daily.   carbamazepine 200 MG tablet Commonly known as:  TEGRETOL TAKE 1 TABLET(200 MG) BY MOUTH DAILY What changed:  See the new instructions.   D3 HIGH POTENCY 1000 units capsule Generic drug:  Cholecalciferol Take 1,000 Units by mouth daily.   docusate sodium 100 MG capsule Commonly known as:  COLACE Take 1 capsule (100 mg total) by mouth 2 (two) times daily.   glucose blood test strip Test twice a day   hydrALAZINE 25 MG tablet Commonly known as:  APRESOLINE Take 1 tablet (25 mg total) by mouth every 8 (eight) hours.   hydrochlorothiazide 12.5 MG capsule Commonly known as:  MICROZIDE Take 1 capsule (12.5 mg total) by mouth daily. Start taking on:  07/27/2018   Insulin Detemir 100 UNIT/ML Pen Commonly known as:  LEVEMIR Inject 50 Units into the skin daily. What changed:    how much to take  when to take this   metFORMIN 500 MG 24 hr tablet Commonly known as:  GLUCOPHAGE-XR Take 2 tablets (1,000 mg total) by mouth 2 (two) times daily. What changed:  how much to take   nitroGLYCERIN 0.4 MG SL tablet Commonly known as:  NITROSTAT Place 1 tablet (0.4 mg total) under the tongue  every 5 (five) minutes x 3 doses as needed for chest pain.   ramipril 5 MG capsule Commonly known as:  ALTACE TAKE 1 BY MOUTH DAILY        DISCHARGE INSTRUCTIONS:  See AVS.  If you experience worsening of your admission symptoms, develop shortness of breath, life threatening emergency, suicidal or homicidal thoughts you must seek medical attention immediately by calling 911 or calling your MD  immediately  if symptoms less severe.  You Must read complete instructions/literature along with all the possible adverse reactions/side effects for all the Medicines you take and that have been prescribed to you. Take any new Medicines after you have completely understood and accpet all the possible adverse reactions/side effects.   Please note  You were cared for by a hospitalist during your hospital stay. If you have any questions about your discharge medications or the care you received while you were in the hospital after you are discharged, you can call the unit and asked to speak with the hospitalist on call if the hospitalist that took care of you is not available. Once you are discharged, your primary care physician will handle any further medical issues. Please note that NO REFILLS for any discharge medications will be authorized once you are discharged, as it is imperative that you return to your primary care physician (or establish a relationship with a primary care physician if you do not have one) for your aftercare needs so that they can reassess your need for medications and monitor your lab values.    On the day of Discharge:  VITAL SIGNS:  Blood pressure (!) 141/47, pulse 70, temperature 98 F (36.7 C), temperature source Oral, resp. rate 18, height 5\' 2"  (1.575 m), weight 70.4 kg, SpO2 100 %. PHYSICAL EXAMINATION:  GENERAL:  72 y.o.-year-old patient lying in the bed with no acute distress.  EYES: Pupils equal, round, reactive to light and accommodation. No scleral icterus. Extraocular muscles intact.  HEENT: Head atraumatic, normocephalic. Oropharynx and nasopharynx clear.  NECK:  Supple, no jugular venous distention. No thyroid enlargement, no tenderness.  LUNGS: Normal breath sounds bilaterally, no wheezing, rales,rhonchi or crepitation. No use of accessory muscles of respiration.  CARDIOVASCULAR: S1, S2 normal. No murmurs, rubs, or gallops.  ABDOMEN: Soft, non-tender,  non-distended. Bowel sounds present. No organomegaly or mass.  EXTREMITIES: No pedal edema, cyanosis, or clubbing.  NEUROLOGIC: Cranial nerves II through XII are intact. Muscle strength 5/5 in all extremities. Sensation intact. Gait not checked.  PSYCHIATRIC: The patient is alert and oriented x 3.  SKIN: No obvious rash, lesion, or ulcer.  DATA REVIEW:   CBC Recent Labs  Lab 07/25/18 0530  WBC 7.1  HGB 11.0*  HCT 32.9*  PLT 260    Chemistries  Recent Labs  Lab 07/24/18 1242 07/25/18 0530  NA 138 139  K 4.3 4.5  CL 103 106  CO2 25 27  GLUCOSE 183* 136*  BUN 15 14  CREATININE 1.06* 1.05*  CALCIUM 9.0 8.7*  MG 1.8  --   AST 17  --   ALT 10  --   ALKPHOS 104  --   BILITOT 0.7  --      Microbiology Results  No results found for this or any previous visit.  RADIOLOGY:  Nm Myocar Multi W/spect W/wall Motion / Ef  Result Date: 07/26/2018  Blood pressure demonstrated a normal response to exercise.  The study is normal.  This is a low risk study.  The left ventricular  ejection fraction is hyperdynamic (>65%).      Management plans discussed with the patient, her daughter and they are in agreement.  CODE STATUS: Full Code   TOTAL TIME TAKING CARE OF THIS PATIENT: 32 minutes.    Demetrios Loll M.D on 07/26/2018 at 2:39 PM  Between 7am to 6pm - Pager - [phone]  After 6pm go to [email or web address] - Proofreader  [employer] [place] [facility]  Office  [phone]  CC: Primary care physician; Glean Hess, MD   Note: This dictation was prepared with Dragon dictation along with smaller phrase technology. Any transcriptional errors that result from this process are unintentional.

## 2018-07-27 ENCOUNTER — Telehealth: Payer: Self-pay

## 2018-07-27 NOTE — Telephone Encounter (Signed)
TOC #1. Called pt to f/u after d/c from [facility] on 07/26/18. Also wanted to confirm her hosp f/u appt w/ Dr. Army Melia on 08/03/18 @ 2:30pm. Discharge planning includes the following:  - Begin Nitrostat, hydralazine and HCTZ - Stop Ziac  - Continue heart healthy diet - f/u Dr. Army Melia 1 week - f/u Dr. Saralyn Pilar 1 week LVM requesting returned call.

## 2018-07-28 ENCOUNTER — Telehealth: Payer: Self-pay

## 2018-07-28 NOTE — Telephone Encounter (Signed)
Transition Care Management Follow-up Telephone Call  Date of discharge and from where: 07/26/18 from [facility]  How have you been since you were released from the hospital? States she is having some fatigue at times but it is improving. Constipation has resolved. Feeling short of breath at times. Recommended she return to ED if dyspnea worsens. Also advised she monitor B/P's daily, log results and to bring with her to her appts with Dr. Saralyn Pilar and Dr. Army Melia.  Any questions or concerns? No   Items Reviewed:  Did the pt receive and understand the discharge instructions provided? Yes   Medications obtained and verified? Yes   Any new allergies since your discharge? No   Dietary orders reviewed? Yes  Do you have support at home? Yes   Other (ie: DME, Home Health, etc) N/A  Functional Questionnaire: (I = Independent and D = Dependent) ADL's: I  Bathing/Dressing- I   Meal Prep- I  Eating- I  Maintaining continence- I  Transferring/Ambulation- I  Managing Meds- I   Follow up appointments reviewed:    PCP Hospital f/u appt confirmed? Yes  Scheduled to see Dr. Army Melia on 08/03/18 @ 2:30pm.  [facility] f/u appt confirmed? Yes  Scheduled to see Dr. Saralyn Pilar on Monday.  Are transportation arrangements needed? No   If their condition worsens, is the pt aware to call  their PCP or go to the ED? Yes  Was the patient provided with contact information for the PCP's office or ED? Yes  Was the pt encouraged to call back with questions or concerns? Yes

## 2018-08-02 DIAGNOSIS — I10 Essential (primary) hypertension: Secondary | ICD-10-CM | POA: Diagnosis not present

## 2018-08-02 DIAGNOSIS — E785 Hyperlipidemia, unspecified: Secondary | ICD-10-CM | POA: Diagnosis not present

## 2018-08-02 DIAGNOSIS — R001 Bradycardia, unspecified: Secondary | ICD-10-CM | POA: Diagnosis not present

## 2018-08-03 ENCOUNTER — Ambulatory Visit (INDEPENDENT_AMBULATORY_CARE_PROVIDER_SITE_OTHER): Payer: Medicare Other | Admitting: Internal Medicine

## 2018-08-03 ENCOUNTER — Encounter: Payer: Self-pay | Admitting: Internal Medicine

## 2018-08-03 VITALS — BP 128/68 | HR 92 | Ht 62.0 in | Wt 156.0 lb

## 2018-08-03 DIAGNOSIS — H6123 Impacted cerumen, bilateral: Secondary | ICD-10-CM | POA: Diagnosis not present

## 2018-08-03 DIAGNOSIS — R001 Bradycardia, unspecified: Secondary | ICD-10-CM

## 2018-08-03 DIAGNOSIS — E1165 Type 2 diabetes mellitus with hyperglycemia: Secondary | ICD-10-CM

## 2018-08-03 DIAGNOSIS — Z794 Long term (current) use of insulin: Secondary | ICD-10-CM

## 2018-08-03 DIAGNOSIS — I10 Essential (primary) hypertension: Secondary | ICD-10-CM | POA: Diagnosis not present

## 2018-08-03 DIAGNOSIS — IMO0001 Reserved for inherently not codable concepts without codable children: Secondary | ICD-10-CM

## 2018-08-03 MED ORDER — GLUCOSE BLOOD VI STRP: ORAL_STRIP | 2 refills | Status: DC

## 2018-08-03 NOTE — Progress Notes (Signed)
Date:  08/03/2018   Name:  [name]   DOB:  Oct 28, 1946   MRN:  [id number]   Chief Complaint: Hospitalization Follow-up (No more dizziness but feeling "jumpy inside." Feels like heart is racing sometimes, and hands are shakey like Blood Sugar is too low or too high. Last A1C 04/14/2018- 8.0. Taking 24 units of insulin at bedtime along with her Metformin 500 mg BID.  ) Hospital follow up:  Admitted to [facility] 07/24/18 to 07/26/18 for dizziness, vomiting and sweating.  She had follow up with Cardiology yesterday.  Low dose metoprolol was prescribed for possible reflex tachycardia.  She will follow up with him in 2 weeks for consideration of Holter monitor. As below, she is still feeling like her heart is racing. She has not yet started the low dose metoprolol. She received a TOC call on 07/28/18.  [name] is a 72 y.o.female patient with a history of type 2 diabetes, hypertension, hyperlipidemia, rectal cancer, and seizure disorder who was recently admitted to [facility] on 07/24/2018. The patient was in her usual state of health until the morning of admission when she developed acute onset of dizziness, vomiting and diaphoresis. She checked her blood sugar eventually and it was elevated, and her son took her to the hospital for further evaluation. In the ER, she was noted to be bradycardic with EKG revealing sinus bradycardia rate of 46 bpm. She does have a history of sinus bradycardia with an EKG in 04/19/2018 revealing rate of 51 bpm. Her bisoprolol was discontinued and her bradycardia improved to 57 bpm. Admission labs were notable for troponin 0 0.04, followed by 0.16, 0.1, and 0.07. She underwent Lexiscan Myoview on 07/26/2018 which revealed normal left ventricular function without evidence of scar or ischemia. 2D echocardiogram on 07/25/2018 revealed normal left ventricular function, with LVEF 55-60%, with grade 2 diastolic dysfunction. Her nausea and vomiting resolved and her dizziness improved. Head CT was  negative for acute abnormality. Since discharge, the patient has had recurrent episodes of heart racing and waking her up in the middle of the night, likely secondary to discontinuing the base of propanolol and experiencing reflex tachycardia. She also hears a "whooshing" sound in her ear intermittently that correlates with her pulse. Patient has a history of vertigo. She has exertional shortness of breath which she feels has progressively worsened over the last year, occurring with minimal exertion. She denies chest pain. She denies peripheral edema. She denies presyncope or syncope.  Diabetes  She presents for her follow-up diabetic visit. She has type 2 diabetes mellitus. Pertinent negatives for hypoglycemia include no dizziness or headaches. Pertinent negatives for diabetes include no chest pain and no fatigue.   She has had no more vertigo like sx but feels like her ears are stopped up and she sometimes hears her pulse in the ear.  She is concerned that her BS are fluctuating wildly.  She eats mostly fruit and not much protein at meals.   Review of Systems  Constitutional: Negative for chills, fatigue and fever.  HENT:       Ear fullness  Respiratory: Negative for cough, chest tightness and shortness of breath.   Cardiovascular: Positive for palpitations. Negative for chest pain.  Neurological: Negative for dizziness and headaches.  Hematological: Negative for adenopathy.  Psychiatric/Behavioral: Negative for dysphoric mood and sleep disturbance.    Patient Active Problem List   Diagnosis Date Noted  . Bradycardia 07/24/2018  . Umbilical hernia with obstruction but no gangrene   .  SBO (small bowel obstruction) ([place]) 04/20/2018  . Chronic left hip pain 04/14/2018  . Neoplasm of uncertain behavior of skin 01/16/2017  . 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 Uncontrolled type 2 diabetes mellitus without complication, with long-term current use of insulin ([place]) 05/08/2015  . Essential (primary)  hypertension 05/08/2015  . History of rectal cancer 05/08/2015  . Hyperlipidemia associated with type 2 diabetes mellitus ([place]) 05/08/2015  . Arthritis of knee, degenerative 05/08/2015  . Cerebral seizure 05/08/2015  . Shoulder strain 05/08/2015  . Avitaminosis D 05/08/2015    Allergies  Allergen Reactions  . Pioglitazone Nausea Only    Past Surgical History:  Procedure Laterality Date  . COLOSTOMY REVERSAL  01/2014  . ILEOSTOMY  05/2013    Social History   Tobacco Use  . Smoking status: Never Smoker  . Smokeless tobacco: Never Used  Substance Use Topics  . Alcohol use: No    Alcohol/week: 0.0 standard drinks  . Drug use: No     Medication list has been reviewed and updated.  Current Meds  Medication Sig  . aspirin 81 MG chewable tablet Chew 81 mg by mouth daily.   Marland Kitchen atorvastatin (LIPITOR) 40 MG tablet Take 1 tablet (40 mg total) by mouth daily.  . bisacodyl (DULCOLAX) 5 MG EC tablet Take 1 tablet (5 mg total) by mouth daily as needed for moderate constipation.  . calcium carbonate (OSCAL) 1500 (600 Ca) MG TABS tablet Take 600 mg of elemental calcium by mouth daily.  . carbamazepine (TEGRETOL) 200 MG tablet TAKE 1 TABLET(200 MG) BY MOUTH DAILY (Patient taking differently: Take 200 mg by mouth daily. )  . Cholecalciferol (D3 HIGH POTENCY) 1000 UNITS capsule Take 1,000 Units by mouth daily.   Marland Kitchen docusate sodium (COLACE) 100 MG capsule Take 1 capsule (100 mg total) by mouth 2 (two) times daily.  Marland Kitchen glucose blood (ONE TOUCH ULTRA TEST) test strip Test twice a day  . hydrALAZINE (APRESOLINE) 25 MG tablet Take 1 tablet (25 mg total) by mouth every 8 (eight) hours.  . hydrochlorothiazide (MICROZIDE) 12.5 MG capsule Take 1 capsule (12.5 mg total) by mouth daily.  . Insulin Detemir (LEVEMIR FLEXTOUCH) 100 UNIT/ML Pen Inject 50 Units into the skin daily. (Patient taking differently: Inject 24 Units into the skin at bedtime. )  . metFORMIN (GLUCOPHAGE-XR) 500 MG 24 hr tablet Take 2  tablets (1,000 mg total) by mouth 2 (two) times daily. (Patient taking differently: Take 500 mg by mouth 2 (two) times daily. )  . metoprolol succinate (TOPROL-XL) 25 MG 24 hr tablet Take 0.5 tablets by mouth daily.  . nitroGLYCERIN (NITROSTAT) 0.4 MG SL tablet Place 1 tablet (0.4 mg total) under the tongue every 5 (five) minutes x 3 doses as needed for chest pain.  . ramipril (ALTACE) 5 MG capsule TAKE 1 BY MOUTH DAILY    PHQ 2/9 Scores 09/21/2017 07/17/2017 02/22/2016 06/27/2015  PHQ - 2 Score 1 0 0 1    Physical Exam  Constitutional: She is oriented to person, place, and time. She appears well-developed. No distress.  HENT:  Head: Normocephalic and atraumatic.  Neck: Normal range of motion. Neck supple.  Cardiovascular: Normal rate, regular rhythm and normal heart sounds.  Pulmonary/Chest: Effort normal and breath sounds normal. No respiratory distress.  Musculoskeletal: Normal range of motion. She exhibits edema. She exhibits no tenderness.  Neurological: She is alert and oriented to person, place, and time.  Skin: Skin is warm and dry. No rash noted.  Psychiatric: Her speech is normal  and behavior is normal. Thought content normal. Her mood appears anxious.  Nursing note and vitals reviewed.   BP 128/68 (BP Location: Right Arm, Patient Position: Sitting, Cuff Size: Normal)   Pulse 92   Ht 5\' 2"  (1.575 m)   Wt 156 lb (70.8 kg)   SpO2 97%   BMI 28.53 kg/m   Assessment and Plan: 1. Essential (primary) hypertension Controlled, begin metoprolol 12.5 mg   2. Uncontrolled type 2 diabetes mellitus without complication, with long-term current use of insulin (HCC) Continue medications, insulin Work on adding protein to diet Will re-evaluate at next visit in 2 weeks  3. Bradycardia improved  4. Impacted cerumen of both ears Recommend ENT   No orders of the defined types were placed in this encounter.   Partially dictated using Editor, commissioning. Any errors are  unintentional.  Halina Maidens, MD [facility]  08/03/2018

## 2018-08-04 ENCOUNTER — Other Ambulatory Visit: Payer: Self-pay | Admitting: Internal Medicine

## 2018-08-04 DIAGNOSIS — IMO0001 Reserved for inherently not codable concepts without codable children: Secondary | ICD-10-CM

## 2018-08-04 DIAGNOSIS — E1165 Type 2 diabetes mellitus with hyperglycemia: Principal | ICD-10-CM

## 2018-08-04 DIAGNOSIS — Z794 Long term (current) use of insulin: Principal | ICD-10-CM

## 2018-08-06 ENCOUNTER — Other Ambulatory Visit: Payer: Self-pay | Admitting: Physician Assistant

## 2018-08-06 DIAGNOSIS — I10 Essential (primary) hypertension: Secondary | ICD-10-CM | POA: Diagnosis not present

## 2018-08-06 DIAGNOSIS — R42 Dizziness and giddiness: Secondary | ICD-10-CM | POA: Diagnosis not present

## 2018-08-06 DIAGNOSIS — H6123 Impacted cerumen, bilateral: Secondary | ICD-10-CM | POA: Diagnosis not present

## 2018-08-06 DIAGNOSIS — G4089 Other seizures: Secondary | ICD-10-CM | POA: Diagnosis not present

## 2018-08-06 DIAGNOSIS — H93A2 Pulsatile tinnitus, left ear: Secondary | ICD-10-CM

## 2018-08-06 DIAGNOSIS — H93A9 Pulsatile tinnitus, unspecified ear: Secondary | ICD-10-CM | POA: Diagnosis not present

## 2018-08-06 DIAGNOSIS — H903 Sensorineural hearing loss, bilateral: Secondary | ICD-10-CM | POA: Diagnosis not present

## 2018-08-06 DIAGNOSIS — D649 Anemia, unspecified: Secondary | ICD-10-CM | POA: Diagnosis not present

## 2018-08-06 DIAGNOSIS — E119 Type 2 diabetes mellitus without complications: Secondary | ICD-10-CM | POA: Diagnosis not present

## 2018-08-16 DIAGNOSIS — Z85048 Personal history of other malignant neoplasm of rectum, rectosigmoid junction, and anus: Secondary | ICD-10-CM | POA: Diagnosis not present

## 2018-08-17 DIAGNOSIS — R001 Bradycardia, unspecified: Secondary | ICD-10-CM | POA: Diagnosis not present

## 2018-08-17 DIAGNOSIS — E785 Hyperlipidemia, unspecified: Secondary | ICD-10-CM | POA: Diagnosis not present

## 2018-08-17 DIAGNOSIS — I10 Essential (primary) hypertension: Secondary | ICD-10-CM | POA: Diagnosis not present

## 2018-08-18 ENCOUNTER — Ambulatory Visit
Admission: RE | Admit: 2018-08-18 | Discharge: 2018-08-18 | Disposition: A | Discharge status: Home / Self Care | Payer: Medicare Other | Source: Ambulatory Visit | Attending: Physician Assistant | Admitting: Physician Assistant

## 2018-08-18 ENCOUNTER — Ambulatory Visit (INDEPENDENT_AMBULATORY_CARE_PROVIDER_SITE_OTHER): Payer: Medicare Other | Admitting: Internal Medicine

## 2018-08-18 ENCOUNTER — Ambulatory Visit: Payer: Medicare Other

## 2018-08-18 ENCOUNTER — Encounter: Payer: Self-pay | Admitting: Internal Medicine

## 2018-08-18 VITALS — BP 120/42 | HR 92 | Ht 62.0 in | Wt 153.0 lb

## 2018-08-18 DIAGNOSIS — E785 Hyperlipidemia, unspecified: Secondary | ICD-10-CM | POA: Diagnosis not present

## 2018-08-18 DIAGNOSIS — Z1231 Encounter for screening mammogram for malignant neoplasm of breast: Secondary | ICD-10-CM | POA: Diagnosis not present

## 2018-08-18 DIAGNOSIS — I6523 Occlusion and stenosis of bilateral carotid arteries: Secondary | ICD-10-CM | POA: Insufficient documentation

## 2018-08-18 DIAGNOSIS — E1165 Type 2 diabetes mellitus with hyperglycemia: Secondary | ICD-10-CM

## 2018-08-18 DIAGNOSIS — G40909 Epilepsy, unspecified, not intractable, without status epilepticus: Secondary | ICD-10-CM

## 2018-08-18 DIAGNOSIS — I6789 Other cerebrovascular disease: Secondary | ICD-10-CM

## 2018-08-18 DIAGNOSIS — E1122 Type 2 diabetes mellitus with diabetic chronic kidney disease: Secondary | ICD-10-CM | POA: Insufficient documentation

## 2018-08-18 DIAGNOSIS — E1169 Type 2 diabetes mellitus with other specified complication: Secondary | ICD-10-CM | POA: Diagnosis not present

## 2018-08-18 DIAGNOSIS — IMO0001 Reserved for inherently not codable concepts without codable children: Secondary | ICD-10-CM

## 2018-08-18 DIAGNOSIS — I10 Essential (primary) hypertension: Secondary | ICD-10-CM

## 2018-08-18 DIAGNOSIS — Z794 Long term (current) use of insulin: Secondary | ICD-10-CM

## 2018-08-18 DIAGNOSIS — G9389 Other specified disorders of brain: Secondary | ICD-10-CM | POA: Diagnosis not present

## 2018-08-18 DIAGNOSIS — H93A2 Pulsatile tinnitus, left ear: Secondary | ICD-10-CM | POA: Insufficient documentation

## 2018-08-18 DIAGNOSIS — N183 Chronic kidney disease, stage 3 (moderate): Secondary | ICD-10-CM

## 2018-08-18 DIAGNOSIS — E118 Type 2 diabetes mellitus with unspecified complications: Secondary | ICD-10-CM | POA: Insufficient documentation

## 2018-08-18 DIAGNOSIS — I6522 Occlusion and stenosis of left carotid artery: Secondary | ICD-10-CM | POA: Diagnosis not present

## 2018-08-18 HISTORY — DX: Malignant neoplasm of colon, unspecified: C18.9

## 2018-08-18 MED ORDER — IOPAMIDOL (ISOVUE-370) INJECTION 76%
75.0000 mL | Freq: Once | INTRAVENOUS | Status: AC | PRN
  Administered 2018-08-18: 75 mL via INTRAVENOUS

## 2018-08-18 NOTE — Progress Notes (Signed)
Date:  08/18/2018   Name:  [name]   DOB:  10/26/1946   MRN:  [id number]   Chief Complaint: Diabetes; Hypertension; and Immunizations (High dose flu shot.)  Hypertension  This is a chronic problem. The problem is controlled. Pertinent negatives include no chest pain, headaches, palpitations or shortness of breath. Past treatments include ACE inhibitors, beta blockers, direct vasodilators and diuretics. The current treatment provides significant improvement. There is no history of CAD/MI or CVA.  Diabetes  She has type 2 diabetes mellitus. Her disease course has been stable. Pertinent negatives for hypoglycemia include no dizziness, headaches or tremors. There are no diabetic associated symptoms. Pertinent negatives for diabetes include no chest pain, no fatigue, no polydipsia and no polyuria. There are no hypoglycemic complications. Pertinent negatives for diabetic complications include no CVA. Current diabetic treatment includes insulin injections and oral agent (monotherapy). She is compliant with treatment all of the time. She is following a generally healthy diet. She monitors blood glucose at home 1-2 x per day. Her breakfast blood glucose is taken between 6-7 am. Her breakfast blood glucose range is generally 90-110 mg/dl. Her dinner blood glucose is taken between 4-5 pm. Her dinner blood glucose range is generally 130-140 mg/dl. An ACE inhibitor/angiotensin II receptor blocker is being taken.  Seizure disorder - diagnosed with seizure disorder in the 1980's.  Has been on current therapy for more than 20 years. Last episode - described as confusion without focal activity - in 1995. Tinnitus - hears pulse in left ear so cardiology has her scheduled for a CTA of head today.   Lab Results  Component Value Date   HGBA1C 8.0 (H) 04/14/2018     Review of Systems  Constitutional: Negative for appetite change, chills, fatigue, fever and unexpected weight change.  HENT: Positive for  tinnitus (hears pulse in left ear). Negative for trouble swallowing.   Eyes: Negative for visual disturbance.  Respiratory: Negative for cough, chest tightness and shortness of breath.   Cardiovascular: Negative for chest pain, palpitations and leg swelling.  Gastrointestinal: Negative for abdominal pain.  Endocrine: Negative for polydipsia and polyuria.  Genitourinary: Negative for dysuria and hematuria.  Musculoskeletal: Negative for arthralgias.  Neurological: Negative for dizziness, tremors, numbness and headaches.  Psychiatric/Behavioral: Negative for dysphoric mood.    Patient Active Problem List   Diagnosis Date Noted  . Type 2 diabetes mellitus with stage 3 chronic kidney disease, without long-term current use of insulin ([place]) 08/18/2018  . Bradycardia 07/24/2018  . Chronic left hip pain 04/14/2018  . Neoplasm of uncertain behavior of skin 01/16/2017  . 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 Essential (primary) hypertension 05/08/2015  . History of rectal cancer 05/08/2015  . Hyperlipidemia associated with type 2 diabetes mellitus ([place]) 05/08/2015  . Arthritis of knee, degenerative 05/08/2015  . Cerebral seizure 05/08/2015  . Shoulder strain 05/08/2015  . Avitaminosis D 05/08/2015    Allergies  Allergen Reactions  . Pioglitazone Nausea Only    Past Surgical History:  Procedure Laterality Date  . COLOSTOMY REVERSAL  01/2014  . ILEOSTOMY  05/2013  . UMBILICAL HERNIA REPAIR  05/2018    Social History   Tobacco Use  . Smoking status: Never Smoker  . Smokeless tobacco: Never Used  Substance Use Topics  . Alcohol use: No    Alcohol/week: 0.0 standard drinks  . Drug use: No     Medication list has been reviewed and updated.  Current Meds  Medication Sig  . aspirin 81 MG  chewable tablet Chew 81 mg by mouth daily.   Marland Kitchen atorvastatin (LIPITOR) 40 MG tablet Take 1 tablet (40 mg total) by mouth daily.  . bisacodyl (DULCOLAX) 5 MG EC tablet Take 1 tablet (5 mg total) by  mouth daily as needed for moderate constipation.  . calcium carbonate (OSCAL) 1500 (600 Ca) MG TABS tablet Take 600 mg of elemental calcium by mouth daily.  . carbamazepine (TEGRETOL) 200 MG tablet TAKE 1 TABLET(200 MG) BY MOUTH DAILY (Patient taking differently: Take 200 mg by mouth daily. )  . Cholecalciferol (D3 HIGH POTENCY) 1000 UNITS capsule Take 1,000 Units by mouth daily.   Marland Kitchen docusate sodium (COLACE) 100 MG capsule Take 1 capsule (100 mg total) by mouth 2 (two) times daily.  Marland Kitchen glucose blood (ONE TOUCH ULTRA TEST) test strip Test twice a day  . hydrALAZINE (APRESOLINE) 25 MG tablet Take 1 tablet (25 mg total) by mouth every 8 (eight) hours.  . hydrochlorothiazide (MICROZIDE) 12.5 MG capsule Take 1 capsule (12.5 mg total) by mouth daily.  . Insulin Detemir (LEVEMIR FLEXTOUCH) 100 UNIT/ML Pen Inject 50 Units into the skin daily. (Patient taking differently: Inject 24 Units into the skin at bedtime. )  . metFORMIN (GLUCOPHAGE-XR) 500 MG 24 hr tablet TAKE 2 TABLETS BY MOUTH TWICE DAILY  . metoprolol succinate (TOPROL-XL) 25 MG 24 hr tablet Take 0.5 tablets by mouth daily.  . mometasone (ELOCON) 0.1 % ointment Apply topically daily. Uses for ear drops  . nitroGLYCERIN (NITROSTAT) 0.4 MG SL tablet Place 1 tablet (0.4 mg total) under the tongue every 5 (five) minutes x 3 doses as needed for chest pain.  . ramipril (ALTACE) 5 MG capsule TAKE 1 BY MOUTH DAILY    PHQ 2/9 Scores 09/21/2017 07/17/2017 02/22/2016 06/27/2015  PHQ - 2 Score 1 0 0 1    Physical Exam  Constitutional: She is oriented to person, place, and time. She appears well-developed and well-nourished.  HENT:  Right Ear: Tympanic membrane and ear canal normal.  Left Ear: Tympanic membrane and ear canal normal.  Neck: Normal range of motion. Neck supple. Carotid bruit is not present.  Cardiovascular: Normal rate, regular rhythm and normal heart sounds.  Pulmonary/Chest: Effort normal and breath sounds normal.  Musculoskeletal: She  exhibits no edema or tenderness.  Lymphadenopathy:    She has no cervical adenopathy.  Neurological: She is alert and oriented to person, place, and time.  Skin: Skin is warm and dry.  Psychiatric: She has a normal mood and affect. Her speech is normal and behavior is normal.    BP (!) 120/42 (BP Location: Right Arm, Patient Position: Sitting, Cuff Size: Normal)   Pulse 92   Ht 5\' 2"  (1.575 m)   Wt 153 lb (69.4 kg)   SpO2 97%   BMI 27.98 kg/m   Assessment and Plan: 1. Essential (primary) hypertension Controlled on current regimen  2. Uncontrolled type 2 diabetes mellitus without complication, with long-term current use of insulin (HCC) Continue medications, diet - Hemoglobin A1c  3. Hyperlipidemia associated with type 2 diabetes mellitus ([place]) On statin therapy  4. Cerebral seizure Stable on Tegretol  5. Encounter for screening mammogram for breast cancer Pt to schedule at [facility]   Partially dictated using Editor, commissioning. Any errors are unintentional.  Halina Maidens, MD [facility]  08/18/2018

## 2018-08-19 ENCOUNTER — Other Ambulatory Visit: Payer: Self-pay

## 2018-08-19 LAB — HEMOGLOBIN A1C
ESTIMATED AVERAGE GLUCOSE: 169 mg/dL
Hgb A1c MFr Bld: 7.5 % — ABNORMAL HIGH (ref 4.8–5.6)

## 2018-08-19 MED ORDER — HYDROCHLOROTHIAZIDE 12.5 MG PO CAPS: 12.5000 mg | ORAL_CAPSULE | Freq: Every day | ORAL | 1 refills | Status: DC

## 2018-08-23 DIAGNOSIS — I868 Varicose veins of other specified sites: Secondary | ICD-10-CM | POA: Diagnosis not present

## 2018-08-23 DIAGNOSIS — H93A9 Pulsatile tinnitus, unspecified ear: Secondary | ICD-10-CM | POA: Diagnosis not present

## 2018-08-23 DIAGNOSIS — M26609 Unspecified temporomandibular joint disorder, unspecified side: Secondary | ICD-10-CM | POA: Diagnosis not present

## 2018-08-23 DIAGNOSIS — J329 Chronic sinusitis, unspecified: Secondary | ICD-10-CM | POA: Diagnosis not present

## 2018-08-23 DIAGNOSIS — I672 Cerebral atherosclerosis: Secondary | ICD-10-CM | POA: Diagnosis not present

## 2018-08-23 DIAGNOSIS — G9389 Other specified disorders of brain: Secondary | ICD-10-CM | POA: Diagnosis not present

## 2018-08-23 DIAGNOSIS — M5013 Cervical disc disorder with radiculopathy, cervicothoracic region: Secondary | ICD-10-CM | POA: Diagnosis not present

## 2018-08-24 ENCOUNTER — Encounter: Payer: Self-pay | Admitting: Internal Medicine

## 2018-08-24 DIAGNOSIS — H25013 Cortical age-related cataract, bilateral: Secondary | ICD-10-CM | POA: Diagnosis not present

## 2018-08-24 DIAGNOSIS — I6523 Occlusion and stenosis of bilateral carotid arteries: Secondary | ICD-10-CM | POA: Insufficient documentation

## 2018-08-24 DIAGNOSIS — I700 Atherosclerosis of aorta: Secondary | ICD-10-CM | POA: Insufficient documentation

## 2018-08-24 LAB — HM DIABETES EYE EXAM

## 2018-09-02 ENCOUNTER — Ambulatory Visit (INDEPENDENT_AMBULATORY_CARE_PROVIDER_SITE_OTHER): Payer: Medicare Other

## 2018-09-02 DIAGNOSIS — Z23 Encounter for immunization: Secondary | ICD-10-CM

## 2018-09-14 ENCOUNTER — Other Ambulatory Visit: Payer: Self-pay | Admitting: Internal Medicine

## 2018-09-27 ENCOUNTER — Ambulatory Visit: Payer: Self-pay

## 2018-10-05 ENCOUNTER — Other Ambulatory Visit: Payer: Self-pay

## 2018-10-11 DIAGNOSIS — E119 Type 2 diabetes mellitus without complications: Secondary | ICD-10-CM | POA: Diagnosis not present

## 2018-10-11 DIAGNOSIS — B351 Tinea unguium: Secondary | ICD-10-CM | POA: Diagnosis not present

## 2018-11-05 ENCOUNTER — Other Ambulatory Visit: Payer: Self-pay | Admitting: Internal Medicine

## 2018-11-18 DIAGNOSIS — I10 Essential (primary) hypertension: Secondary | ICD-10-CM | POA: Diagnosis not present

## 2018-11-18 DIAGNOSIS — R001 Bradycardia, unspecified: Secondary | ICD-10-CM | POA: Diagnosis not present

## 2018-11-18 DIAGNOSIS — E785 Hyperlipidemia, unspecified: Secondary | ICD-10-CM | POA: Diagnosis not present

## 2018-11-23 DIAGNOSIS — R921 Mammographic calcification found on diagnostic imaging of breast: Secondary | ICD-10-CM | POA: Diagnosis not present

## 2018-11-23 DIAGNOSIS — Z1231 Encounter for screening mammogram for malignant neoplasm of breast: Secondary | ICD-10-CM | POA: Diagnosis not present

## 2018-12-01 ENCOUNTER — Ambulatory Visit (INDEPENDENT_AMBULATORY_CARE_PROVIDER_SITE_OTHER): Payer: Medicare Other

## 2018-12-01 VITALS — BP 132/64 | HR 72 | Temp 98.1°F | Resp 16 | Ht 62.0 in | Wt 153.0 lb

## 2018-12-01 DIAGNOSIS — Z0000 Encounter for general adult medical examination without abnormal findings: Secondary | ICD-10-CM

## 2018-12-01 NOTE — Progress Notes (Signed)
Subjective:   [name] is a 73 y.o. female who presents for Medicare Annual (Subsequent) preventive examination.  Review of Systems:   Cardiac Risk Factors include: advanced age (>41men, >10 women);diabetes mellitus;hypertension;dyslipidemia     Objective:     Vitals: BP 132/64 (BP Location: Right Arm, Patient Position: Sitting, Cuff Size: Normal)   Pulse 72   Temp 98.1 F (36.7 C) (Oral)   Resp 16   Ht 5\' 2"  (1.575 m)   Wt 153 lb (69.4 kg)   SpO2 99%   BMI 27.98 kg/m   Body mass index is 27.98 kg/m.  Advanced Directives 12/01/2018 07/24/2018 07/24/2018 04/20/2018 04/19/2018 09/21/2017 02/22/2016  Does Patient Have a Medical Advance Directive? Yes No No No No No No  Does patient want to make changes to medical advance directive? (No Data) - - - - - -  Would patient like information on creating a medical advance directive? - No - Patient declined - No - Patient declined - Yes (MAU/Ambulatory/Procedural Areas - Information given) No - patient declined information    Tobacco Social History   Tobacco Use  Smoking Status Never Smoker  Smokeless Tobacco Never Used     Counseling given: Not Answered   Clinical Intake:  Pre-visit preparation completed: Yes  Pain : No/denies pain     Nutritional Status: BMI 25 -29 Overweight Nutritional Risks: Nausea/ vomitting/ diarrhea(diarrhea) Diabetes: Yes CBG done?: No Did pt. bring in CBG monitor from home?: No   Nutrition Risk Assessment:  Has the patient had any N/V/D within the last 2 months?  Yes  Does the patient have any non-healing wounds?  No  Has the patient had any unintentional weight loss or weight gain?  No   Diabetes:  Is the patient diabetic?  Yes  If diabetic, was a CBG obtained today?  No  Did the patient bring in their glucometer from home?  No  How often do you monitor your CBG's? Daily fasting in am.   Financial Strains and Diabetes Management:  Are you having any financial strains with the device,  your supplies or your medication? No .  Does the patient want to be seen by Chronic Care Management for management of their diabetes?  No  Would the patient like to be referred to a Nutritionist or for Diabetic Management?  No   Diabetic Exams:  Diabetic Eye Exam: Completed October 2019 per patient, requesting records from Dr. Mallie Mussel - negative retinopathy..   Diabetic Foot Exam: Completed 04/14/18.  How often do you need to have someone help you when you read instructions, pamphlets, or other written materials from your doctor or pharmacy?: 1 - Never What is the last grade level you completed in school?: 1 year of college  Interpreter Needed?: No  Information entered by :: Clemetine Marker LPN  Past Medical History:  Diagnosis Date  . Allergy   . Colon cancer ([place]) 2014   Partial colon resection, chemo + rad tx's.   . Diabetes mellitus without complication ([place])    Pt takes Metformin.  Marland Kitchen Hyperlipidemia   . Hypertension   . SBO (small bowel obstruction) ([place]) 04/20/2018  . Seizures ([place])   . Umbilical hernia with obstruction but no gangrene   . Vitamin D deficiency    Past Surgical History:  Procedure Laterality Date  . COLOSTOMY REVERSAL  01/2014  . ILEOSTOMY  05/2013  . UMBILICAL HERNIA REPAIR  05/2018   Family History  Problem Relation Age of Onset  . Cancer  Mother        unknown type  . Diabetes Daughter   . Diabetes Son    Social History   Socioeconomic History  . Marital status: Widowed    Spouse name: Not on file  . Number of children: 3  . Years of education: Not on file  . Highest education level: Some college, no degree  Occupational History  . Not on file  Social Needs  . Financial resource strain: Not very hard  . Food insecurity:    Worry: Never true    Inability: Never true  . Transportation needs:    Medical: No    Non-medical: No  Tobacco Use  . Smoking status: Never Smoker  . Smokeless tobacco: Never Used  Substance and Sexual Activity  .  Alcohol use: No    Alcohol/week: 0.0 standard drinks  . Drug use: No  . Sexual activity: Not Currently  Lifestyle  . Physical activity:    Days per week: 0 days    Minutes per session: 0 min  . Stress: Not at all  Relationships  . Social connections:    Talks on phone: More than three times a week    Gets together: More than three times a week    Attends religious service: More than 4 times per year    Active member of club or organization: No    Attends meetings of clubs or organizations: Never    Relationship status: Widowed  Other Topics Concern  . Not on file  Social History Narrative  . Not on file    Outpatient Encounter Medications as of 12/01/2018  Medication Sig  . aspirin 81 MG chewable tablet Chew 81 mg by mouth daily.   Marland Kitchen atorvastatin (LIPITOR) 40 MG tablet Take 1 tablet (40 mg total) by mouth daily.  . bisacodyl (DULCOLAX) 5 MG EC tablet Take 1 tablet (5 mg total) by mouth daily as needed for moderate constipation.  . calcium carbonate (OSCAL) 1500 (600 Ca) MG TABS tablet Take 600 mg of elemental calcium by mouth daily.  . carbamazepine (TEGRETOL) 200 MG tablet TAKE 1 TABLET(200 MG) BY MOUTH DAILY (Patient taking differently: Take 200 mg by mouth daily. )  . Cholecalciferol (D3 HIGH POTENCY) 1000 UNITS capsule Take 1,000 Units by mouth daily.   Marland Kitchen docusate sodium (COLACE) 100 MG capsule Take 1 capsule (100 mg total) by mouth 2 (two) times daily.  Marland Kitchen glucose blood (ONE TOUCH ULTRA TEST) test strip Test twice a day  . hydrALAZINE (APRESOLINE) 25 MG tablet TAKE 1 TABLET BY MOUTH EVERY 8 HOURS  . hydrochlorothiazide (MICROZIDE) 12.5 MG capsule Take 1 capsule (12.5 mg total) by mouth daily.  . metFORMIN (GLUCOPHAGE-XR) 500 MG 24 hr tablet TAKE 2 TABLETS BY MOUTH TWICE DAILY  . metoprolol succinate (TOPROL-XL) 25 MG 24 hr tablet Take 0.5 tablets by mouth daily.  . mometasone (ELOCON) 0.1 % ointment Apply topically daily. Uses for ear drops  . nitroGLYCERIN (NITROSTAT) 0.4 MG  SL tablet Place 1 tablet (0.4 mg total) under the tongue every 5 (five) minutes x 3 doses as needed for chest pain.  . ramipril (ALTACE) 5 MG capsule TAKE 1 CAPSULE BY MOUTH DAILY  . Insulin Detemir (LEVEMIR FLEXTOUCH) 100 UNIT/ML Pen Inject 50 Units into the skin daily. (Patient taking differently: Inject 24 Units into the skin at bedtime. Pt taking 25 units qhs)   No facility-administered encounter medications on file as of 12/01/2018.     Activities of Daily Living In  your present state of health, do you have any difficulty performing the following activities: 12/01/2018 07/24/2018  Hearing? Y N  Comment mild hearing difficulty, no hearing aids -  Vision? N N  Difficulty concentrating or making decisions? N Y  Walking or climbing stairs? N Y  Dressing or bathing? N N  Doing errands, shopping? N Y  Conservation officer, nature and eating ? N -  Using the Toilet? N -  In the past six months, have you accidently leaked urine? N -  Comment wears pads for protection occaisonally -  Do you have problems with loss of bowel control? N -  Managing your Medications? N -  Managing your Finances? N -  Housekeeping or managing your Housekeeping? N -  Some recent data might be hidden    Patient Care Team: Glean Hess, MD as PCP - General (Internal Medicine) Gwinda Maine, MD as Referring Physician (Oncology) [facility] (Ophthalmology) Zafar, Rondel Jumbo (Inactive) as Consulting Physician Isaias Cowman, MD as Consulting Physician (Cardiology) Carloyn Manner, MD as Referring Physician (Otolaryngology) [facility])    Assessment:   This is a routine wellness examination for Karyme.  Exercise Activities and Dietary recommendations Current Exercise Habits: The patient does not participate in regular exercise at present, Exercise limited by: cardiac condition(s)  Goals    . Exercise 150 minutes per week (moderate activity)     Recommend to increase exercise from 1 day/20  minutes per week to 5 days/30 minutes per week       Fall Risk Fall Risk  12/01/2018 10/05/2018 09/21/2017 07/17/2017 02/22/2016  Falls in the past year? 0 0 Yes Yes Yes  Comment - Emmi Telephone Survey: data to providers prior to load - - -  Number falls in past yr: 0 - 2 or more 1 1  Injury with Fall? - - No No No  Follow up - - Education provided;Falls prevention discussed Falls evaluation completed -   FALL RISK PREVENTION PERTAINING TO THE HOME:  Any stairs in or around the home WITH handrails? No  Home free of loose throw rugs in walkways, pet beds, electrical cords, etc? Yes  Adequate lighting in your home to reduce risk of falls? Yes   ASSISTIVE DEVICES UTILIZED TO PREVENT FALLS:  Life alert? No  Use of a cane, walker or w/c? Yes  Grab bars in the bathroom? Yes  Shower chair or bench in shower? Yes  Elevated toilet seat or a handicapped toilet? Yes   DME ORDERS:  DME order needed?  No   TIMED UP AND GO:  Was the test performed? Yes .  Length of time to ambulate 10 feet: 6 sec.   GAIT:  Appearance of gait: Gait stead-fast and without the use of an assistive device.  Education: Fall risk prevention has been discussed.  Intervention(s) required? No    Depression Screen PHQ 2/9 Scores 12/01/2018 09/21/2017 07/17/2017 02/22/2016  PHQ - 2 Score 2 1 0 0  PHQ- 9 Score 4 - - -     Cognitive Function     6CIT Screen 12/01/2018 09/21/2017  What Year? 0 points 0 points  What month? 0 points 0 points  What time? 0 points 0 points  Count back from 20 0 points 0 points  Months in reverse 0 points 0 points  Repeat phrase 0 points 2 points  Total Score 0 2    Immunization History  Administered Date(s) Administered  . Influenza, High Dose Seasonal PF 09/21/2017, 09/02/2018  .  Influenza,inj,Quad PF,6+ Mos 08/20/2015, 09/17/2016  . Pneumococcal Conjugate-13 10/22/2015  . Pneumococcal Polysaccharide-23 07/17/2017  . Tdap 12/19/2014    Qualifies for Shingles Vaccine? Yes   . Due for Shingrix. Education has been provided regarding the importance of this vaccine. Pt has been advised to call insurance company to determine out of pocket expense. Advised may also receive vaccine at local pharmacy or Health Dept. Verbalized acceptance and understanding.  Tdap: Up to date  Flu Vaccine: Up to date  Pneumococcal Vaccine: Up to date   Screening Tests Health Maintenance  Topic Date Due  . OPHTHALMOLOGY EXAM  07/17/2018  . HEMOGLOBIN A1C  02/17/2019  . FOOT EXAM  04/15/2019  . MAMMOGRAM  11/24/2019  . COLONOSCOPY  06/12/2020  . TETANUS/TDAP  12/19/2024  . INFLUENZA VACCINE  Completed  . Hepatitis C Screening  Completed  . PNA vac Low Risk Adult  Completed  . DEXA SCAN  Addressed    Cancer Screenings:  Colorectal Screening: Completed 06/12/17. Repeat every 3 years;   Mammogram: Completed 11/23/18. Repeat every year;   Bone Density: Completed 04/25/14. Results reflect NORMAL,  Repeat every 2 years. Pt declines repeat screening at this time.  Lung Cancer Screening: (Low Dose CT Chest recommended if Age 63-80 years, 30 pack-year currently smoking OR have quit w/in 15years.) does not qualify.    Additional Screening:  Hepatitis C Screening: does qualify; Completed 12/02/17  Vision Screening: Recommended annual ophthalmology exams for early detection of glaucoma and other disorders of the eye. Is the patient up to date with their annual eye exam?  Yes  Who is the provider or what is the name of the office in which the pt attends annual eye exams? Dr. Mallie Mussel  Dental Screening: Recommended annual dental exams for proper oral hygiene  Community Resource Referral:  CRR required this visit?  No      Plan:    I have personally reviewed and addressed the Medicare Annual Wellness questionnaire and have noted the following in the patient's chart:  A. Medical and social history B. Use of alcohol, tobacco or illicit drugs  C. Current medications and  supplements D. Functional ability and status E.  Nutritional status F.  Physical activity G. Advance directives H. List of other physicians I.  Hospitalizations, surgeries, and ER visits in previous 12 months J.  [place] such as hearing and vision if needed, cognitive and depression L. Referrals and appointments   In addition, I have reviewed and discussed with patient certain preventive protocols, quality metrics, and best practice recommendations. A written personalized care plan for preventive services as well as general preventive health recommendations were provided to patient.   Signed,  Clemetine Marker, LPN Nurse Health Advisor   Nurse Notes: Pt anxious during visit today, she had an episode of diarrhea last night and she had carotid studies done in October she is concerned about. Patients would like to discuss results of blockages with Dr. Army Melia before making any decisions about next steps. She also had a mammogram last week and has to have repeat imaging done in 6 months.

## 2018-12-01 NOTE — Patient Instructions (Signed)
[name] , Thank you for taking time to come for your Medicare Wellness Visit. I appreciate your ongoing commitment to your health goals. Please review the following plan we discussed and let me know if I can assist you in the future.   Screening recommendations/referrals: Colonoscopy: done 06/12/17 repeat in 2021 Mammogram: done 11/23/18 Bone Density: done 2016. Recommended yearly ophthalmology/optometry visit for glaucoma screening and checkup Recommended yearly dental visit for hygiene and checkup  Vaccinations: Influenza vaccine: done 09/02/18 Pneumococcal vaccine: done 07/17/17 Tdap vaccine: done 12/19/14 Shingles vaccine: Shingrix discussed. Please contact your pharmacy for coverage information.     Advanced directives: Advance directive discussed with you today. Even though you declined this today please call our office should you change your mind and we can give you the proper paperwork for you to fill out.  Conditions/risks identified: Recommend increasing physical activity to 150 minutes per week.  Next appointment: Please follow up in one year for your Medicare Annual Wellness visit.     Preventive Care 28 Years and Older, Female Preventive care refers to lifestyle choices and visits with your health care provider that can promote health and wellness. What does preventive care include?  A yearly physical exam. This is also called an annual well check.  Dental exams once or twice a year.  Routine eye exams. Ask your health care provider how often you should have your eyes checked.  Personal lifestyle choices, including:  Daily care of your teeth and gums.  Regular physical activity.  Eating a healthy diet.  Avoiding tobacco and drug use.  Limiting alcohol use.  Practicing safe sex.  Taking low-dose aspirin every day.  Taking vitamin and mineral supplements as recommended by your health care provider. What happens during an annual well check? The services and  screenings done by your health care provider during your annual well check will depend on your age, overall health, lifestyle risk factors, and family history of disease. Counseling  Your health care provider may ask you questions about your:  Alcohol use.  Tobacco use.  Drug use.  Emotional well-being.  Home and relationship well-being.  Sexual activity.  Eating habits.  History of falls.  Memory and ability to understand (cognition).  Work and work Statistician.  Reproductive health. Screening  You may have the following tests or measurements:  Height, weight, and BMI.  Blood pressure.  Lipid and cholesterol levels. These may be checked every 5 years, or more frequently if you are over 66 years old.  Skin check.  Lung cancer screening. You may have this screening every year starting at age 78 if you have a 30-pack-year history of smoking and currently smoke or have quit within the past 15 years.  Fecal occult blood test (FOBT) of the stool. You may have this test every year starting at age 41.  Flexible sigmoidoscopy or colonoscopy. You may have a sigmoidoscopy every 5 years or a colonoscopy every 10 years starting at age 1.  Hepatitis C blood test.  Hepatitis B blood test.  Sexually transmitted disease (STD) testing.  Diabetes screening. This is done by checking your blood sugar (glucose) after you have not eaten for a while (fasting). You may have this done every 1-3 years.  Bone density scan. This is done to screen for osteoporosis. You may have this done starting at age 28.  Mammogram. This may be done every 1-2 years. Talk to your health care provider about how often you should have regular mammograms. Talk with your  health care provider about your test results, treatment options, and if necessary, the need for more tests. Vaccines  Your health care provider may recommend certain vaccines, such as:  Influenza vaccine. This is recommended every  year.  Tetanus, diphtheria, and acellular pertussis (Tdap, Td) vaccine. You may need a Td booster every 10 years.  Zoster vaccine. You may need this after age 62.  Pneumococcal 13-valent conjugate (PCV13) vaccine. One dose is recommended after age 55.  Pneumococcal polysaccharide (PPSV23) vaccine. One dose is recommended after age 76. Talk to your health care provider about which screenings and vaccines you need and how often you need them. This information is not intended to replace advice given to you by your health care provider. Make sure you discuss any questions you have with your health care provider. Document Released: 11/30/2015 Document Revised: 07/23/2016 Document Reviewed: 09/04/2015 Elsevier Interactive Patient Education  2017 [place] Prevention in the Home Falls can cause injuries. They can happen to people of all ages. There are many things you can do to make your home safe and to help prevent falls. What can I do on the outside of my home?  Regularly fix the edges of walkways and driveways and fix any cracks.  Remove anything that might make you trip as you walk through a door, such as a raised step or threshold.  Trim any bushes or trees on the path to your home.  Use bright outdoor lighting.  Clear any walking paths of anything that might make someone trip, such as rocks or tools.  Regularly check to see if handrails are loose or broken. Make sure that both sides of any steps have handrails.  Any raised decks and porches should have guardrails on the edges.  Have any leaves, snow, or ice cleared regularly.  Use sand or salt on walking paths during winter.  Clean up any spills in your garage right away. This includes oil or grease spills. What can I do in the bathroom?  Use night lights.  Install grab bars by the toilet and in the tub and shower. Do not use towel bars as grab bars.  Use non-skid mats or decals in the tub or shower.  If you  need to sit down in the shower, use a plastic, non-slip stool.  Keep the floor dry. Clean up any water that spills on the floor as soon as it happens.  Remove soap buildup in the tub or shower regularly.  Attach bath mats securely with double-sided non-slip rug tape.  Do not have throw rugs and other things on the floor that can make you trip. What can I do in the bedroom?  Use night lights.  Make sure that you have a light by your bed that is easy to reach.  Do not use any sheets or blankets that are too big for your bed. They should not hang down onto the floor.  Have a firm chair that has side arms. You can use this for support while you get dressed.  Do not have throw rugs and other things on the floor that can make you trip. What can I do in the kitchen?  Clean up any spills right away.  Avoid walking on wet floors.  Keep items that you use a lot in easy-to-reach places.  If you need to reach something above you, use a strong step stool that has a grab bar.  Keep electrical cords out of the way.  Do not use  floor polish or wax that makes floors slippery. If you must use wax, use non-skid floor wax.  Do not have throw rugs and other things on the floor that can make you trip. What can I do with my stairs?  Do not leave any items on the stairs.  Make sure that there are handrails on both sides of the stairs and use them. Fix handrails that are broken or loose. Make sure that handrails are as long as the stairways.  Check any carpeting to make sure that it is firmly attached to the stairs. Fix any carpet that is loose or worn.  Avoid having throw rugs at the top or bottom of the stairs. If you do have throw rugs, attach them to the floor with carpet tape.  Make sure that you have a light switch at the top of the stairs and the bottom of the stairs. If you do not have them, ask someone to add them for you. What else can I do to help prevent falls?  Wear shoes  that:  Do not have high heels.  Have rubber bottoms.  Are comfortable and fit you well.  Are closed at the toe. Do not wear sandals.  If you use a stepladder:  Make sure that it is fully opened. Do not climb a closed stepladder.  Make sure that both sides of the stepladder are locked into place.  Ask someone to hold it for you, if possible.  Clearly mark and make sure that you can see:  Any grab bars or handrails.  First and last steps.  Where the edge of each step is.  Use tools that help you move around (mobility aids) if they are needed. These include:  Canes.  Walkers.  Scooters.  Crutches.  Turn on the lights when you go into a dark area. Replace any light bulbs as soon as they burn out.  Set up your furniture so you have a clear path. Avoid moving your furniture around.  If any of your floors are uneven, fix them.  If there are any pets around you, be aware of where they are.  Review your medicines with your doctor. Some medicines can make you feel dizzy. This can increase your chance of falling. Ask your doctor what other things that you can do to help prevent falls. This information is not intended to replace advice given to you by your health care provider. Make sure you discuss any questions you have with your health care provider. Document Released: 08/30/2009 Document Revised: 04/10/2016 Document Reviewed: 12/08/2014 Elsevier Interactive Patient Education  2017 [employer].

## 2018-12-15 ENCOUNTER — Ambulatory Visit (INDEPENDENT_AMBULATORY_CARE_PROVIDER_SITE_OTHER): Payer: Medicare Other | Admitting: Internal Medicine

## 2018-12-15 ENCOUNTER — Encounter: Payer: Self-pay | Admitting: Internal Medicine

## 2018-12-15 ENCOUNTER — Other Ambulatory Visit: Payer: Self-pay

## 2018-12-15 VITALS — BP 118/70 | HR 60 | Ht 62.0 in | Wt 156.0 lb

## 2018-12-15 DIAGNOSIS — N183 Chronic kidney disease, stage 3 unspecified: Secondary | ICD-10-CM

## 2018-12-15 DIAGNOSIS — E785 Hyperlipidemia, unspecified: Secondary | ICD-10-CM

## 2018-12-15 DIAGNOSIS — E1169 Type 2 diabetes mellitus with other specified complication: Secondary | ICD-10-CM

## 2018-12-15 DIAGNOSIS — Z794 Long term (current) use of insulin: Secondary | ICD-10-CM

## 2018-12-15 DIAGNOSIS — I700 Atherosclerosis of aorta: Secondary | ICD-10-CM | POA: Diagnosis not present

## 2018-12-15 DIAGNOSIS — I6523 Occlusion and stenosis of bilateral carotid arteries: Secondary | ICD-10-CM

## 2018-12-15 DIAGNOSIS — I10 Essential (primary) hypertension: Secondary | ICD-10-CM

## 2018-12-15 DIAGNOSIS — I6789 Other cerebrovascular disease: Secondary | ICD-10-CM | POA: Diagnosis not present

## 2018-12-15 DIAGNOSIS — H6123 Impacted cerumen, bilateral: Secondary | ICD-10-CM | POA: Diagnosis not present

## 2018-12-15 DIAGNOSIS — E1122 Type 2 diabetes mellitus with diabetic chronic kidney disease: Secondary | ICD-10-CM

## 2018-12-15 DIAGNOSIS — G40909 Epilepsy, unspecified, not intractable, without status epilepticus: Secondary | ICD-10-CM

## 2018-12-15 LAB — POCT URINALYSIS DIPSTICK
BILIRUBIN UA: NEGATIVE
Glucose, UA: NEGATIVE
KETONES UA: NEGATIVE
Leukocytes, UA: NEGATIVE
Nitrite, UA: NEGATIVE
PH UA: 6 (ref 5.0–8.0)
Protein, UA: NEGATIVE
RBC UA: NEGATIVE
SPEC GRAV UA: 1.015 (ref 1.010–1.025)
UROBILINOGEN UA: 0.2 U/dL

## 2018-12-15 NOTE — Progress Notes (Signed)
Date:  12/15/2018   Name:  [name]   DOB:  1945/12/08   MRN:  [id number]   Chief Complaint: Hypertension (Just has MAW two weeks ago. Medicare Yearly Follow up.) and Diabetes TAHJ [name] is a 73 y.o. female who presents today for her Complete Annual Exam. She feels fairly well. She reports exercising none. She reports she is sleeping fairly well. She recently had mammogram showing punctate calcifications in the right breast.  She will have a 6 mo follow up.  Hypertension  This is a chronic problem. The problem is controlled. Pertinent negatives include no chest pain, headaches, palpitations or shortness of breath. Past treatments include beta blockers and ACE inhibitors.  Diabetes  She presents for her follow-up diabetic visit. She has type 2 diabetes mellitus. Pertinent negatives for hypoglycemia include no dizziness, headaches, nervousness/anxiousness or tremors. Pertinent negatives for diabetes include no chest pain, no fatigue, no polydipsia and no polyuria. Current diabetic treatment includes insulin injections and oral agent (monotherapy). She is compliant with treatment all of the time. Her weight is stable. She participates in exercise daily. An ACE inhibitor/angiotensin II receptor blocker is being taken. She sees a podiatrist.Eye exam is current.  Hyperlipidemia  This is a chronic problem. The problem is controlled. Pertinent negatives include no chest pain or shortness of breath. Current antihyperlipidemic treatment includes statins. The current treatment provides significant improvement of lipids.  Abnormal CTA -  CT HEAD:  1. No acute intracranial process. 2. RIGHT parietal encephalomalacia with calcification with faint enhancement seen with old infarct, vascular malformation or treated metastasis. Enhancing component appears reflect a vessel though, recommend follow-up MRI of the brain with contrast on non emergent Basis. Discussed with patient - she is not  inclined to have an MRI.  The abnormality on the right she says is old from a bleed at age 66.  She has been on tegretol to prevent seizures for many years with no adverse effects.  Lab Results  Component Value Date   HGBA1C 7.5 (H) 08/18/2018   Lab Results  Component Value Date   CREATININE 1.05 (H) 07/25/2018   BUN 14 07/25/2018   NA 139 07/25/2018   K 4.5 07/25/2018   CL 106 07/25/2018   CO2 27 07/25/2018     Review of Systems  Constitutional: Negative for chills, fatigue and fever.  HENT: Positive for hearing loss. Negative for congestion, tinnitus, trouble swallowing and voice change.   Eyes: Negative for visual disturbance.  Respiratory: Negative for cough, chest tightness, shortness of breath and wheezing.   Cardiovascular: Negative for chest pain, palpitations and leg swelling.  Gastrointestinal: Negative for abdominal pain, constipation, diarrhea and vomiting.  Endocrine: Negative for polydipsia and polyuria.  Genitourinary: Negative for dysuria, frequency, genital sores, vaginal bleeding and vaginal discharge.  Musculoskeletal: Positive for arthralgias. Negative for gait problem and joint swelling.  Skin: Negative for color change and rash.  Neurological: Negative for dizziness, tremors, light-headedness and headaches.  Hematological: Negative for adenopathy. Does not bruise/bleed easily.  Psychiatric/Behavioral: Negative for dysphoric mood and sleep disturbance. The patient is not nervous/anxious.     Patient Active Problem List   Diagnosis Date Noted  . Bilateral carotid artery stenosis 08/24/2018  . Atherosclerosis of aorta ([place]) 08/24/2018  . Type 2 diabetes mellitus with stage 3 chronic kidney disease, with long-term current use of insulin ([place]) 08/18/2018  . Bradycardia 07/24/2018  . Chronic left hip pain 04/14/2018  . Neoplasm of uncertain behavior of skin 01/16/2017  .  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 Essential (primary) hypertension 05/08/2015  . History  of rectal cancer 05/08/2015  . Hyperlipidemia associated with type 2 diabetes mellitus ([place]) 05/08/2015  . Arthritis of knee, degenerative 05/08/2015  . Cerebral seizure 05/08/2015  . Shoulder strain 05/08/2015  . Avitaminosis D 05/08/2015    Allergies  Allergen Reactions  . Pioglitazone Nausea Only    Past Surgical History:  Procedure Laterality Date  . COLOSTOMY REVERSAL  01/2014  . ILEOSTOMY  05/2013  . UMBILICAL HERNIA REPAIR  05/2018    Social History   Tobacco Use  . Smoking status: Never Smoker  . Smokeless tobacco: Never Used  Substance Use Topics  . Alcohol use: No    Alcohol/week: 0.0 standard drinks  . Drug use: No     Medication list has been reviewed and updated.  Current Meds  Medication Sig  . aspirin 81 MG chewable tablet Chew 81 mg by mouth daily.   Marland Kitchen atorvastatin (LIPITOR) 40 MG tablet Take 1 tablet (40 mg total) by mouth daily.  . bisacodyl (DULCOLAX) 5 MG EC tablet Take 1 tablet (5 mg total) by mouth daily as needed for moderate constipation.  . calcium carbonate (OSCAL) 1500 (600 Ca) MG TABS tablet Take 600 mg of elemental calcium by mouth daily.  . carbamazepine (TEGRETOL) 200 MG tablet TAKE 1 TABLET(200 MG) BY MOUTH DAILY (Patient taking differently: Take 200 mg by mouth daily. )  . Cholecalciferol (D3 HIGH POTENCY) 1000 UNITS capsule Take 1,000 Units by mouth daily.   Marland Kitchen docusate sodium (COLACE) 100 MG capsule Take 1 capsule (100 mg total) by mouth 2 (two) times daily.  Marland Kitchen glucose blood (ONE TOUCH ULTRA TEST) test strip Test twice a day  . hydrALAZINE (APRESOLINE) 25 MG tablet TAKE 1 TABLET BY MOUTH EVERY 8 HOURS  . hydrochlorothiazide (MICROZIDE) 12.5 MG capsule Take 1 capsule (12.5 mg total) by mouth daily.  . metFORMIN (GLUCOPHAGE-XR) 500 MG 24 hr tablet TAKE 2 TABLETS BY MOUTH TWICE DAILY  . metoprolol succinate (TOPROL-XL) 25 MG 24 hr tablet Take 0.5 tablets by mouth daily.  . mometasone (ELOCON) 0.1 % ointment Apply topically daily. Uses for  ear drops  . nitroGLYCERIN (NITROSTAT) 0.4 MG SL tablet Place 1 tablet (0.4 mg total) under the tongue every 5 (five) minutes x 3 doses as needed for chest pain.  . ramipril (ALTACE) 5 MG capsule TAKE 1 CAPSULE BY MOUTH DAILY    PHQ 2/9 Scores 12/01/2018 09/21/2017 07/17/2017 02/22/2016  PHQ - 2 Score 2 1 0 0  PHQ- 9 Score 4 - - -   Wt Readings from Last 3 Encounters:  12/15/18 156 lb (70.8 kg)  12/01/18 153 lb (69.4 kg)  08/18/18 153 lb (69.4 kg)     Physical Exam Vitals signs and nursing note reviewed.  Constitutional:      General: She is not in acute distress.    Appearance: She is well-developed.  HENT:     Head: Normocephalic and atraumatic.     Right Ear: Tympanic membrane and ear canal normal.     Left Ear: Tympanic membrane and ear canal normal.     Ears:     Comments: Cerumen impaction bilaterally    Nose:     Right Sinus: No maxillary sinus tenderness.     Left Sinus: No maxillary sinus tenderness.     Mouth/Throat:     Pharynx: Uvula midline.  Eyes:     General: No scleral icterus.  Right eye: No discharge.        Left eye: No discharge.     Conjunctiva/sclera: Conjunctivae normal.  Neck:     Musculoskeletal: Normal range of motion. No erythema.     Thyroid: No thyromegaly.     Vascular: No carotid bruit.  Cardiovascular:     Rate and Rhythm: Normal rate and regular rhythm.     Pulses: Normal pulses.     Heart sounds: Normal heart sounds.  Pulmonary:     Effort: Pulmonary effort is normal. No respiratory distress.     Breath sounds: No wheezing.  Abdominal:     General: Bowel sounds are normal.     Palpations: Abdomen is soft.     Tenderness: There is no abdominal tenderness.  Musculoskeletal: Normal range of motion.  Lymphadenopathy:     Cervical: No cervical adenopathy.  Skin:    General: Skin is warm and dry.     Findings: No rash.  Neurological:     Mental Status: She is alert and oriented to person, place, and time.     Cranial Nerves: No  cranial nerve deficit.     Sensory: No sensory deficit.     Deep Tendon Reflexes: Reflexes are normal and symmetric.  Psychiatric:        Speech: Speech normal.        Behavior: Behavior normal.        Thought Content: Thought content normal.     BP 118/70   Pulse 60   Ht 5\' 2"  (1.575 m)   Wt 156 lb (70.8 kg)   BMI 28.53 kg/m   Assessment and Plan: 1. Bilateral carotid artery stenosis Continue aspirin 81 mg daily Continue high dose statin US in one year  2. Essential (primary) hypertension controlled - CBC with Differential/Platelet - TSH - POCT urinalysis dipstick  3. Hyperlipidemia associated with type 2 diabetes mellitus ([place]) On statin therapy - Lipid panel  4. Type 2 diabetes mellitus with stage 3 chronic kidney disease, with long-term current use of insulin (HCC) Controlled on insulin and metformin Continue healthy diet and exercise if able - Comprehensive metabolic panel - Hemoglobin A1c  5. Cerebral seizure None in 25+ years - Carbamazepine Level (Tegretol), total  6. Atherosclerosis of aorta ([place]) As above - continue aspirin and statin  7. Bilateral impacted cerumen See ENT   Partially dictated using Dragon software. Any errors are unintentional.  Halina Maidens, MD [facility]  12/15/2018

## 2018-12-16 LAB — COMPREHENSIVE METABOLIC PANEL
A/G RATIO: 2.5 — AB (ref 1.2–2.2)
ALT: 13 IU/L (ref 0–32)
AST: 15 IU/L (ref 0–40)
Albumin: 4.7 g/dL (ref 3.7–4.7)
Alkaline Phosphatase: 118 IU/L — ABNORMAL HIGH (ref 39–117)
BILIRUBIN TOTAL: 0.2 mg/dL (ref 0.0–1.2)
BUN / CREAT RATIO: 13 (ref 12–28)
BUN: 15 mg/dL (ref 8–27)
CHLORIDE: 98 mmol/L (ref 96–106)
CO2: 24 mmol/L (ref 20–29)
Calcium: 9.7 mg/dL (ref 8.7–10.3)
Creatinine, Ser: 1.13 mg/dL — ABNORMAL HIGH (ref 0.57–1.00)
GFR calc non Af Amer: 48 mL/min/{1.73_m2} — ABNORMAL LOW (ref >59)
GFR, EST AFRICAN AMERICAN: 56 mL/min/{1.73_m2} — AB (ref >59)
Globulin, Total: 1.9 g/dL (ref 1.5–4.5)
Glucose: 125 mg/dL — ABNORMAL HIGH (ref 65–99)
POTASSIUM: 4.3 mmol/L (ref 3.5–5.2)
Sodium: 139 mmol/L (ref 134–144)
TOTAL PROTEIN: 6.6 g/dL (ref 6.0–8.5)

## 2018-12-16 LAB — CBC WITH DIFFERENTIAL/PLATELET
Basophils Absolute: 0.1 10*3/uL (ref 0.0–0.2)
Basos: 1 %
EOS (ABSOLUTE): 0.1 10*3/uL (ref 0.0–0.4)
EOS: 2 %
HEMATOCRIT: 34.2 % (ref 34.0–46.6)
HEMOGLOBIN: 11.3 g/dL (ref 11.1–15.9)
IMMATURE GRANS (ABS): 0 10*3/uL (ref 0.0–0.1)
IMMATURE GRANULOCYTES: 0 %
Lymphocytes Absolute: 0.8 10*3/uL (ref 0.7–3.1)
Lymphs: 10 %
MCH: 29 pg (ref 26.6–33.0)
MCHC: 33 g/dL (ref 31.5–35.7)
MCV: 88 fL (ref 79–97)
MONOCYTES: 11 %
Monocytes Absolute: 0.8 10*3/uL (ref 0.1–0.9)
NEUTROS PCT: 76 %
Neutrophils Absolute: 5.8 10*3/uL (ref 1.4–7.0)
Platelets: 273 10*3/uL (ref 150–450)
RBC: 3.9 x10E6/uL (ref 3.77–5.28)
RDW: 14 % (ref 11.7–15.4)
WBC: 7.6 10*3/uL (ref 3.4–10.8)

## 2018-12-16 LAB — LIPID PANEL
CHOL/HDL RATIO: 2 ratio (ref 0.0–4.4)
CHOLESTEROL TOTAL: 223 mg/dL — AB (ref 100–199)
HDL: 109 mg/dL (ref >39)
LDL CALC: 101 mg/dL — AB (ref 0–99)
Triglycerides: 67 mg/dL (ref 0–149)
VLDL Cholesterol Cal: 13 mg/dL (ref 5–40)

## 2018-12-16 LAB — CARBAMAZEPINE LEVEL, TOTAL: CARBAMAZEPINE LVL: 5.7 ug/mL (ref 4.0–12.0)

## 2018-12-16 LAB — HEMOGLOBIN A1C
Est. average glucose Bld gHb Est-mCnc: 171 mg/dL
Hgb A1c MFr Bld: 7.6 % — ABNORMAL HIGH (ref 4.8–5.6)

## 2018-12-16 LAB — TSH: TSH: 1.88 u[IU]/mL (ref 0.450–4.500)

## 2018-12-17 NOTE — Progress Notes (Signed)
Patient informed of labs OTP - sent to front desk to schedule 6 month follow up.

## 2018-12-22 ENCOUNTER — Encounter: Payer: Self-pay | Admitting: Internal Medicine

## 2018-12-24 ENCOUNTER — Encounter: Payer: Self-pay | Admitting: Internal Medicine

## 2018-12-31 DIAGNOSIS — H6062 Unspecified chronic otitis externa, left ear: Secondary | ICD-10-CM | POA: Diagnosis not present

## 2018-12-31 DIAGNOSIS — H6123 Impacted cerumen, bilateral: Secondary | ICD-10-CM | POA: Diagnosis not present

## 2019-02-01 ENCOUNTER — Other Ambulatory Visit: Payer: Self-pay | Admitting: Internal Medicine

## 2019-02-01 DIAGNOSIS — E785 Hyperlipidemia, unspecified: Principal | ICD-10-CM

## 2019-02-01 DIAGNOSIS — E1169 Type 2 diabetes mellitus with other specified complication: Secondary | ICD-10-CM

## 2019-02-11 ENCOUNTER — Other Ambulatory Visit: Payer: Self-pay | Admitting: Internal Medicine

## 2019-02-23 ENCOUNTER — Other Ambulatory Visit: Payer: Self-pay | Admitting: Internal Medicine

## 2019-02-23 DIAGNOSIS — IMO0001 Reserved for inherently not codable concepts without codable children: Secondary | ICD-10-CM

## 2019-02-23 DIAGNOSIS — Z794 Long term (current) use of insulin: Principal | ICD-10-CM

## 2019-02-23 DIAGNOSIS — E1165 Type 2 diabetes mellitus with hyperglycemia: Principal | ICD-10-CM

## 2019-03-18 DIAGNOSIS — C4491 Basal cell carcinoma of skin, unspecified: Secondary | ICD-10-CM

## 2019-03-18 HISTORY — DX: Basal cell carcinoma of skin, unspecified: C44.91

## 2019-03-23 DIAGNOSIS — I10 Essential (primary) hypertension: Secondary | ICD-10-CM | POA: Diagnosis not present

## 2019-03-31 IMAGING — CT CT ANGIO NECK
1 of 12 series · 4 of 33 positions shown · IV contrast (iopamidol)
Comparison: CT HEAD July 24, 2018

CLINICAL DATA: Six months of pulsatile tinnitus chest and
intermittent dizziness when supine. History of colon cancer,
hypertension and seizures.

EXAM:
CT ANGIOGRAPHY HEAD AND NECK
TECHNIQUE: Multidetector CT imaging of the head and neck was performed using
the standard protocol during bolus administration of intravenous
contrast. Multiplanar CT image reconstructions and MIPs were
obtained to evaluate the vascular anatomy. Carotid stenosis
measurements (when applicable) are obtained utilizing NASCET
criteria, using the distal internal carotid diameter as the
denominator.
CONTRAST:  75mL FF6SRL-18W IOPAMIDOL (FF6SRL-18W) INJECTION 76%

[Series 17: ax thin mips cta head & neck · axial · 0.57mm/px · z∈[-701,-509]mm · 4 of 321 slices shown]
[im 65/321  soft-tissue]
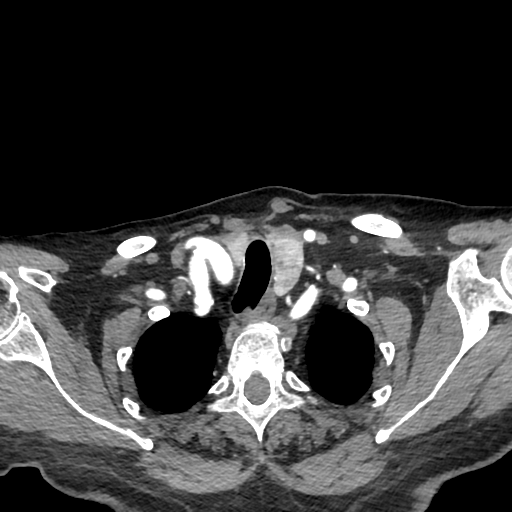
[im 129/321  bone]
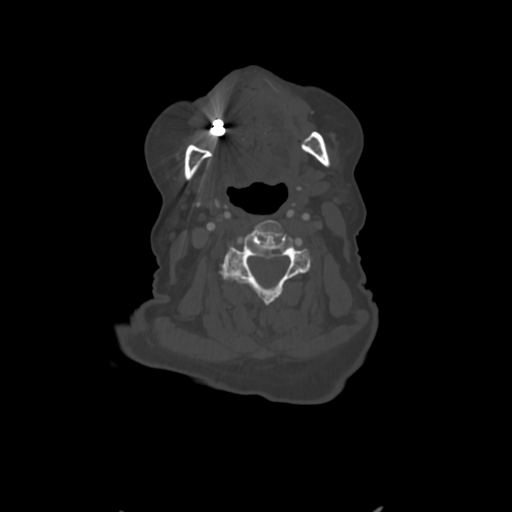
[im 193/321  soft-tissue]
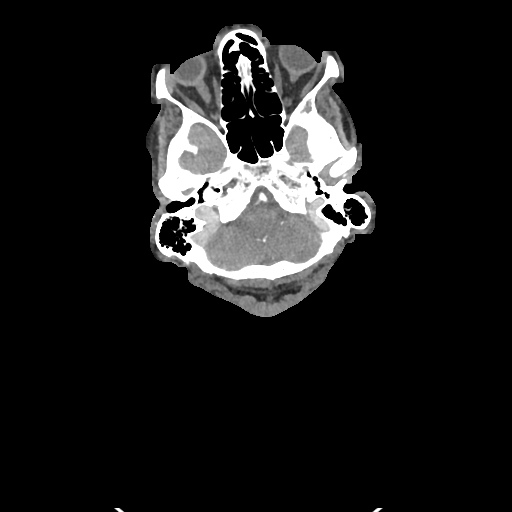
[im 257/321  bone]
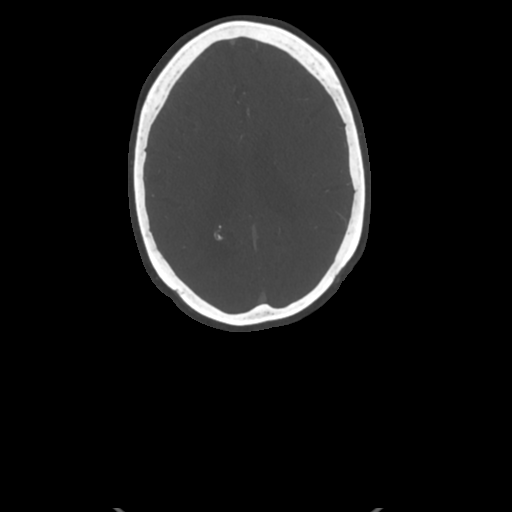

[4 of 33 positions shown; findings below may reference images not displayed]

FINDINGS: CT HEAD FINDINGS

BRAIN: No intraparenchymal hemorrhage, mass effect nor midline
shift. RIGHT parietal encephalomalacia with coarse calcifications.
Ex vacuo dense dilatation subjacent RIGHT lateral ventricle. No
global parenchymal brain volume loss for age. No hydrocephalus.
Patchy supratentorial white matter hypodensities within normal range
for patient's age, though non-specific are most compatible with
chronic small vessel ischemic disease. No acute large vascular
territory infarcts. No abnormal extra-axial fluid collections. Basal
cisterns are patent.

VASCULAR: Moderate to severe calcific atherosclerosis of the carotid
siphons.

SKULL: No skull fracture. Moderate RIGHT temporomandibular
osteoarthrosis. No significant scalp soft tissue swelling.

SINUSES/ORBITS: Trace paranasal sinus mucosal thickening. Mastoid
air cells are well aerated.The included ocular globes and orbital
contents are non-suspicious.

OTHER: Patient is edentulous.

CTA NECK FINDINGS:

AORTIC ARCH: Normal appearance of the thoracic arch, normal branch
pattern. Mild calcific atherosclerosis and intimal thickening aortic
arch. The origins of the innominate, left Common carotid artery and
subclavian artery are widely patent.

RIGHT CAROTID SYSTEM: Common carotid artery is patent. Severe
calcific atherosclerosis RIGHT carotid bifurcation resulting in less
than 50% stenosis by NASCET criteria. Patent internal carotid
artery.

LEFT CAROTID SYSTEM: Common carotid artery is patent. Calcific
atherosclerosis resulting in 65% stenosis LEFT ICA by NASCET
criteria. Patent internal carotid artery.

VERTEBRAL ARTERIES:Severe stenosis RIGHT vertebral artery origin.
LEFT vertebral artery is dominant. Patent with extrinsic compression
due to degenerative cervical spine.

SKELETON: No acute osseous process though bone windows have not been
submitted. Multilevel severe spondylosis and moderate to severe LEFT
facet arthropathy. Severe bilateral C3-4, RIGHT C4-5, bilateral C5-6
and bilateral C6-7 neural foraminal narrowing.

OTHER NECK: Soft tissues of the neck are nonacute though, not
tailored for evaluation. Heterogeneous LEFT thyroid without dominant
nodule. Multiple phleboliths LEFT lower face within submandibular
space, lateral pharyngeal space and infratemporal fossa. Associated
soft tissue fullness without abnormal vascularity on arterial phase.

UPPER CHEST: Included lung apices are clear. No superior mediastinal
lymphadenopathy.

CTA HEAD FINDINGS:

ANTERIOR CIRCULATION: Patent cervical internal carotid arteries,
petrous, cavernous and supra clinoid internal carotid arteries.
Moderate stenosis bilateral supraclinoid ICA. Severe stenosis LEFT
A1 segment. Patent anterior communicating artery. Long segment
severe stenosis RIGHT M1 segment, RIGHT M2 origin. Patent anterior
and middle cerebral arteries.

No large vessel occlusion, contrast extravasation or aneurysm.

POSTERIOR CIRCULATION: Patent vertebral arteries, vertebrobasilar
junction and basilar artery, as well as main branch vessels. Severe
stenosis bilateral SCA. Patent posterior cerebral arteries. Moderate
tandem stenosis bilateral PCA.

No large vessel occlusion, contrast extravasation or aneurysm.

VENOUS SINUSES: Major dural venous sinuses are patent though not
tailored for evaluation on this angiographic examination.

ANATOMIC VARIANTS: None.

DELAYED PHASE: Slight linear enhancement along the medial margin of
the RIGHT parietal calcification.

MIP images reviewed.
IMPRESSION: CT HEAD:

1. No acute intracranial process.
2. RIGHT parietal encephalomalacia with calcification with faint
enhancement seen with old infarct, vascular malformation or treated
metastasis. Enhancing component appears reflect a vessel though,
recommend follow-up MRI of the brain with contrast on non emergent
basis.

CTA NECK:

1. 65% stenosis LEFT ICA and less than 50% stenosis RIGHT ICA.
2. Severe stenosis RIGHT vertebral artery origin. Patent vertebral
arteries.
3. Multilevel severe neural foraminal narrowing.
4. Soft tissue fullness and phleboliths LEFT lower face most
compatible with slow flow vascular malformation. Non emergent MRI of
the neck with contrast would be confirmative.

CTA HEAD:

1. No emergent large vessel occlusion.
2. Severe stenoses anterior and middle cerebral arteries and
moderate stenoses posterior cerebral arteries compatible with
atherosclerosis.
3. Moderate stenosis bilateral ICA.

## 2019-04-13 DIAGNOSIS — C44321 Squamous cell carcinoma of skin of nose: Secondary | ICD-10-CM | POA: Diagnosis not present

## 2019-04-13 DIAGNOSIS — L578 Other skin changes due to chronic exposure to nonionizing radiation: Secondary | ICD-10-CM | POA: Diagnosis not present

## 2019-04-13 DIAGNOSIS — D485 Neoplasm of uncertain behavior of skin: Secondary | ICD-10-CM | POA: Diagnosis not present

## 2019-04-22 ENCOUNTER — Other Ambulatory Visit: Payer: Self-pay | Admitting: Internal Medicine

## 2019-04-28 DIAGNOSIS — C44321 Squamous cell carcinoma of skin of nose: Secondary | ICD-10-CM | POA: Diagnosis not present

## 2019-05-12 DIAGNOSIS — H6123 Impacted cerumen, bilateral: Secondary | ICD-10-CM | POA: Diagnosis not present

## 2019-05-12 DIAGNOSIS — H6062 Unspecified chronic otitis externa, left ear: Secondary | ICD-10-CM | POA: Diagnosis not present

## 2019-05-30 DIAGNOSIS — R921 Mammographic calcification found on diagnostic imaging of breast: Secondary | ICD-10-CM | POA: Diagnosis not present

## 2019-05-30 DIAGNOSIS — R928 Other abnormal and inconclusive findings on diagnostic imaging of breast: Secondary | ICD-10-CM | POA: Diagnosis not present

## 2019-06-10 ENCOUNTER — Ambulatory Visit (INDEPENDENT_AMBULATORY_CARE_PROVIDER_SITE_OTHER): Payer: Medicare Other | Admitting: Internal Medicine

## 2019-06-10 ENCOUNTER — Encounter: Payer: Self-pay | Admitting: Internal Medicine

## 2019-06-10 ENCOUNTER — Other Ambulatory Visit: Payer: Self-pay

## 2019-06-10 VITALS — BP 130/74 | HR 72 | Ht 62.0 in | Wt 157.0 lb

## 2019-06-10 DIAGNOSIS — E1122 Type 2 diabetes mellitus with diabetic chronic kidney disease: Secondary | ICD-10-CM

## 2019-06-10 DIAGNOSIS — N183 Chronic kidney disease, stage 3 unspecified: Secondary | ICD-10-CM

## 2019-06-10 DIAGNOSIS — I6789 Other cerebrovascular disease: Secondary | ICD-10-CM | POA: Diagnosis not present

## 2019-06-10 DIAGNOSIS — M160 Bilateral primary osteoarthritis of hip: Secondary | ICD-10-CM

## 2019-06-10 DIAGNOSIS — I6523 Occlusion and stenosis of bilateral carotid arteries: Secondary | ICD-10-CM | POA: Diagnosis not present

## 2019-06-10 DIAGNOSIS — I10 Essential (primary) hypertension: Secondary | ICD-10-CM

## 2019-06-10 DIAGNOSIS — G40909 Epilepsy, unspecified, not intractable, without status epilepticus: Secondary | ICD-10-CM

## 2019-06-10 DIAGNOSIS — Z794 Long term (current) use of insulin: Secondary | ICD-10-CM | POA: Diagnosis not present

## 2019-06-10 MED ORDER — CARBAMAZEPINE 200 MG PO TABS: 200.0000 mg | ORAL_TABLET | Freq: Every day | ORAL | 3 refills | Status: DC

## 2019-06-10 MED ORDER — METOPROLOL SUCCINATE ER 25 MG PO TB24: 12.5000 mg | ORAL_TABLET | Freq: Three times a day (TID) | ORAL | 3 refills | Status: DC

## 2019-06-10 MED ORDER — HYDROCHLOROTHIAZIDE 12.5 MG PO CAPS: 12.5000 mg | ORAL_CAPSULE | Freq: Every day | ORAL | 3 refills | Status: DC

## 2019-06-10 NOTE — Progress Notes (Signed)
Date:  06/10/2019   Name:  [name]   DOB:  Sep 21, 1946   MRN:  [id number]   Chief Complaint: Hypertension (6 month follow up.), Diabetes (6 month follow up.), and Depression (9- phq9)  Hypertension This is a chronic problem. The problem is controlled. Pertinent negatives include no chest pain, headaches, palpitations or shortness of breath. Past treatments include beta blockers, diuretics, direct vasodilators and ACE inhibitors. The current treatment provides significant improvement.  Diabetes She presents for her follow-up diabetic visit. She has type 2 diabetes mellitus. Her disease course has been stable. Pertinent negatives for hypoglycemia include no headaches, seizures or tremors. Pertinent negatives for diabetes include no chest pain, no fatigue, no polydipsia and no polyuria. Her weight is stable. She monitors blood glucose at home 1-2 x per day. Her breakfast blood glucose is taken between 6-7 am. Her breakfast blood glucose range is generally 90-110 mg/dl. Her dinner blood glucose is taken between 5-6 pm. Her dinner blood glucose range is generally 130-140 mg/dl. An ACE inhibitor/angiotensin II receptor blocker is being taken.  Hyperlipidemia The problem is controlled. Pertinent negatives include no chest pain or shortness of breath. Current antihyperlipidemic treatment includes statins. The current treatment provides significant improvement of lipids.  Hip Pain  There was no injury mechanism. The pain is present in the right hip. The pain is moderate. The pain has been fluctuating since onset. Pertinent negatives include no muscle weakness or numbness. The symptoms are aggravated by weight bearing. She has tried acetaminophen (or Mobic if pain is severe) for the symptoms. The treatment provided moderate relief.  Seizure disorder - pt has been maintained on tegretol for years with no complications.  No seizure activity noted.  Last levels were normal.  Lab Results  Component  Value Date   HGBA1C 7.6 (H) 12/15/2018   Lab Results  Component Value Date   CREATININE 1.13 (H) 12/15/2018   BUN 15 12/15/2018   NA 139 12/15/2018   K 4.3 12/15/2018   CL 98 12/15/2018   CO2 24 12/15/2018   Lab Results  Component Value Date   CHOL 223 (H) 12/15/2018   HDL 109 12/15/2018   LDLCALC 101 (H) 12/15/2018   TRIG 67 12/15/2018   CHOLHDL 2.0 12/15/2018   Lab Results  Component Value Date   CBMZ 5.7 12/15/2018     Review of Systems  Constitutional: Negative for appetite change, fatigue, fever and unexpected weight change.  HENT: Negative for tinnitus and trouble swallowing.   Eyes: Negative for visual disturbance.  Respiratory: Negative for cough, chest tightness and shortness of breath.   Cardiovascular: Negative for chest pain, palpitations and leg swelling.  Gastrointestinal: Negative for abdominal pain.  Endocrine: Negative for polydipsia and polyuria.  Genitourinary: Negative for dysuria and hematuria.  Musculoskeletal: Positive for arthralgias and gait problem.  Skin: Negative for color change and rash.  Neurological: Negative for tremors, seizures, numbness and headaches.  Hematological: Negative for adenopathy.  Psychiatric/Behavioral: Negative for dysphoric mood and sleep disturbance.    Patient Active Problem List   Diagnosis Date Noted  . Bilateral carotid artery stenosis 08/24/2018  . Atherosclerosis of aorta ([place]) 08/24/2018  . Type 2 diabetes mellitus with stage 3 chronic kidney disease, with long-term current use of insulin ([place]) 08/18/2018  . Bradycardia 07/24/2018  . Chronic left hip pain 04/14/2018  . Neoplasm of uncertain behavior of skin 01/16/2017  . 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 Essential (primary) hypertension 05/08/2015  . History of rectal cancer 05/08/2015  .  Hyperlipidemia associated with type 2 diabetes mellitus ([place]) 05/08/2015  . Arthritis of knee, degenerative 05/08/2015  . Cerebral seizure 05/08/2015  . Shoulder strain  05/08/2015  . Avitaminosis D 05/08/2015    Allergies  Allergen Reactions  . Pioglitazone Nausea Only    Past Surgical History:  Procedure Laterality Date  . COLOSTOMY REVERSAL  01/2014  . ILEOSTOMY  05/2013  . UMBILICAL HERNIA REPAIR  05/2018    Social History   Tobacco Use  . Smoking status: Never Smoker  . Smokeless tobacco: Never Used  Substance Use Topics  . Alcohol use: No    Alcohol/week: 0.0 standard drinks  . Drug use: No     Medication list has been reviewed and updated.  Current Meds  Medication Sig  . aspirin 81 MG chewable tablet Chew 81 mg by mouth daily.   Marland Kitchen atorvastatin (LIPITOR) 40 MG tablet TAKE 1 TABLET BY MOUTH DAILY  . calcium carbonate (OSCAL) 1500 (600 Ca) MG TABS tablet Take 600 mg of elemental calcium by mouth daily.  . carbamazepine (TEGRETOL) 200 MG tablet Take 1 tablet (200 mg total) by mouth daily.  . Cholecalciferol (D3 HIGH POTENCY) 1000 UNITS capsule Take 1,000 Units by mouth daily.   Marland Kitchen glucose blood (ONE TOUCH ULTRA TEST) test strip Test twice a day  . hydrALAZINE (APRESOLINE) 25 MG tablet TAKE 1 TABLET BY MOUTH EVERY 8 HOURS  . hydrochlorothiazide (MICROZIDE) 12.5 MG capsule TAKE 1 CAPSULE(12.5 MG) BY MOUTH DAILY  . Insulin Detemir (LEVEMIR FLEXTOUCH) 100 UNIT/ML Pen Inject 25 Units into the skin at bedtime. (Patient taking differently: Inject 22 Units into the skin at bedtime. )  . meloxicam (MOBIC) 15 MG tablet Take 15 mg by mouth as needed for pain.  . metFORMIN (GLUCOPHAGE-XR) 500 MG 24 hr tablet TAKE 2 TABLETS BY MOUTH TWICE DAILY  . metoprolol succinate (TOPROL-XL) 25 MG 24 hr tablet Take 25 mg by mouth every 8 (eight) hours.   . nitroGLYCERIN (NITROSTAT) 0.4 MG SL tablet Place 1 tablet (0.4 mg total) under the tongue every 5 (five) minutes x 3 doses as needed for chest pain.  Marland Kitchen Propylene Glycol (SYSTANE BALANCE OP) Apply to eye.  . ramipril (ALTACE) 5 MG capsule TAKE 1 CAPSULE BY MOUTH DAILY    PHQ 2/9 Scores 06/10/2019 12/01/2018  09/21/2017 07/17/2017  PHQ - 2 Score 3 2 1  0  PHQ- 9 Score 9 4 - -    BP Readings from Last 3 Encounters:  06/10/19 130/74  12/15/18 118/70  12/01/18 132/64    Physical Exam Vitals signs and nursing note reviewed.  Constitutional:      General: She is not in acute distress.    Appearance: Normal appearance. She is well-developed.  HENT:     Head: Normocephalic and atraumatic.  Cardiovascular:     Rate and Rhythm: Normal rate and regular rhythm.     Pulses: Normal pulses.  Pulmonary:     Effort: Pulmonary effort is normal. No respiratory distress.     Breath sounds: No wheezing or rhonchi.  Musculoskeletal:     Right hip: She exhibits decreased range of motion.     Lumbar back: She exhibits tenderness.     Right lower leg: No edema.     Left lower leg: No edema.  Lymphadenopathy:     Cervical: No cervical adenopathy.  Skin:    General: Skin is warm and dry.     Capillary Refill: Capillary refill takes less than 2 seconds.  Findings: No rash.  Neurological:     Mental Status: She is alert and oriented to person, place, and time.     Sensory: Sensation is intact.     Motor: Motor function is intact.  Psychiatric:        Behavior: Behavior normal.        Thought Content: Thought content normal.     Wt Readings from Last 3 Encounters:  06/10/19 157 lb (71.2 kg)  12/15/18 156 lb (70.8 kg)  12/01/18 153 lb (69.4 kg)    BP 130/74   Pulse 72   Ht 5\' 2"  (1.575 m)   Wt 157 lb (71.2 kg)   SpO2 97%   BMI 28.72 kg/m   Assessment and Plan: 1. Type 2 diabetes mellitus with stage 3 chronic kidney disease, with long-term current use of insulin (HCC) Controlled on current therapy with metformin and insulin On statin therapy - Hemoglobin B8M - Basic metabolic panel  2. Essential (primary) hypertension controlled - metoprolol succinate (TOPROL-XL) 25 MG 24 hr tablet; Take 0.5 tablets (12.5 mg total) by mouth every 8 (eight) hours.  Dispense: 45 tablet; Refill: 3 -  hydrochlorothiazide (MICROZIDE) 12.5 MG capsule; Take 1 capsule (12.5 mg total) by mouth daily.  Dispense: 90 capsule; Refill: 3  3. Cerebral seizure Stable with no seizure activity - carbamazepine (TEGRETOL) 200 MG tablet; Take 1 tablet (200 mg total) by mouth daily.  Dispense: 90 tablet; Refill: 3  4. Primary osteoarthritis of both hips Continue tylenol most days, Mobic PRN severe pain Maintain activity as much as possible   Partially dictated using Editor, commissioning. Any errors are unintentional.  Halina Maidens, MD [facility]  06/10/2019

## 2019-06-11 LAB — HEMOGLOBIN A1C
Est. average glucose Bld gHb Est-mCnc: 171 mg/dL
Hgb A1c MFr Bld: 7.6 % — ABNORMAL HIGH (ref 4.8–5.6)

## 2019-06-11 LAB — BASIC METABOLIC PANEL
BUN/Creatinine Ratio: 12 (ref 12–28)
BUN: 12 mg/dL (ref 8–27)
CO2: 23 mmol/L (ref 20–29)
Calcium: 9.4 mg/dL (ref 8.7–10.3)
Chloride: 92 mmol/L — ABNORMAL LOW (ref 96–106)
Creatinine, Ser: 0.97 mg/dL (ref 0.57–1.00)
GFR calc Af Amer: 67 mL/min/{1.73_m2} (ref >59)
GFR calc non Af Amer: 58 mL/min/{1.73_m2} — ABNORMAL LOW (ref >59)
Glucose: 123 mg/dL — ABNORMAL HIGH (ref 65–99)
Potassium: 4.5 mmol/L (ref 3.5–5.2)
Sodium: 131 mmol/L — ABNORMAL LOW (ref 134–144)

## 2019-07-21 ENCOUNTER — Other Ambulatory Visit: Payer: Self-pay | Admitting: Internal Medicine

## 2019-07-21 DIAGNOSIS — IMO0001 Reserved for inherently not codable concepts without codable children: Secondary | ICD-10-CM

## 2019-07-27 DIAGNOSIS — I10 Essential (primary) hypertension: Secondary | ICD-10-CM | POA: Diagnosis not present

## 2019-07-27 DIAGNOSIS — E785 Hyperlipidemia, unspecified: Secondary | ICD-10-CM | POA: Diagnosis not present

## 2019-07-27 DIAGNOSIS — R001 Bradycardia, unspecified: Secondary | ICD-10-CM | POA: Diagnosis not present

## 2019-08-14 ENCOUNTER — Other Ambulatory Visit: Payer: Self-pay | Admitting: Internal Medicine

## 2019-08-14 DIAGNOSIS — I10 Essential (primary) hypertension: Secondary | ICD-10-CM

## 2019-08-26 DIAGNOSIS — E119 Type 2 diabetes mellitus without complications: Secondary | ICD-10-CM | POA: Diagnosis not present

## 2019-08-26 DIAGNOSIS — B351 Tinea unguium: Secondary | ICD-10-CM | POA: Diagnosis not present

## 2019-08-29 ENCOUNTER — Ambulatory Visit (INDEPENDENT_AMBULATORY_CARE_PROVIDER_SITE_OTHER): Payer: Medicare Other

## 2019-08-29 ENCOUNTER — Other Ambulatory Visit: Payer: Self-pay

## 2019-08-29 DIAGNOSIS — Z23 Encounter for immunization: Secondary | ICD-10-CM | POA: Diagnosis not present

## 2019-09-14 ENCOUNTER — Other Ambulatory Visit: Payer: Self-pay | Admitting: Internal Medicine

## 2019-09-14 DIAGNOSIS — E118 Type 2 diabetes mellitus with unspecified complications: Secondary | ICD-10-CM

## 2019-09-14 MED ORDER — ONETOUCH ULTRA VI STRP: 1.0000 | ORAL_STRIP | Freq: Two times a day (BID) | 3 refills | Status: DC

## 2019-09-15 ENCOUNTER — Other Ambulatory Visit: Payer: Self-pay

## 2019-09-15 MED ORDER — GLUCOSE BLOOD VI STRP: ORAL_STRIP | 12 refills | Status: DC

## 2019-09-18 ENCOUNTER — Other Ambulatory Visit: Payer: Self-pay | Admitting: Internal Medicine

## 2019-09-26 ENCOUNTER — Ambulatory Visit (INDEPENDENT_AMBULATORY_CARE_PROVIDER_SITE_OTHER): Payer: Medicare Other | Admitting: Internal Medicine

## 2019-09-26 ENCOUNTER — Other Ambulatory Visit: Payer: Self-pay

## 2019-09-26 ENCOUNTER — Encounter: Payer: Self-pay | Admitting: Internal Medicine

## 2019-09-26 VITALS — BP 112/68 | HR 74 | Ht 62.0 in | Wt 155.0 lb

## 2019-09-26 DIAGNOSIS — I6523 Occlusion and stenosis of bilateral carotid arteries: Secondary | ICD-10-CM | POA: Diagnosis not present

## 2019-09-26 DIAGNOSIS — E118 Type 2 diabetes mellitus with unspecified complications: Secondary | ICD-10-CM

## 2019-09-26 DIAGNOSIS — F39 Unspecified mood [affective] disorder: Secondary | ICD-10-CM

## 2019-09-26 DIAGNOSIS — R55 Syncope and collapse: Secondary | ICD-10-CM | POA: Diagnosis not present

## 2019-09-26 NOTE — Patient Instructions (Signed)
Decrease Insulin dose to 18 units every day.

## 2019-09-26 NOTE — Progress Notes (Signed)
Date:  09/26/2019   Name:  [name]   DOB:  [birth year]   MRN:  [id number]   Chief Complaint: Dizziness (Spurs of dizziness on and off. Saturday felt fine and then all of a sudden felt bad. Walked to bathroom- felt like she could pass out. Tried to hold herself up and stand up. Legs were numb, and water started dripp on her face, and she felt a "hot flash in my head." Felt weak for the rest of the day. Dizziness has happened before but this is different. ) and Depression (12-phq9)  Pre-syncope - one week ago had a spell of weakness and nausea mid morning, had a dripping sweat that lasted a few minutes.  She was able to get to the bed and rested for several hours.  She had eaten breakfast and taken her medication that day.  She did not check her blood sugar any time that day that she can recall.  She had a similar but much less severe reaction several days later. She is very worried that this might be due to worsening CAS.  She did not think that she had low or high HR during this time.  She did not have SOB or chest pains.  Depression        This is a new problem.  The problem occurs constantly.  Associated symptoms include decreased concentration, fatigue, restlessness and sad.  Associated symptoms include no headaches.     The symptoms are aggravated by social issues.  Past treatments include nothing.  Compliance with prior treatments: not on medication.  Risk factors include a recent illness.  Diabetes Hypoglycemia symptoms include nervousness/anxiousness. Pertinent negatives for hypoglycemia include no dizziness, headaches, seizures or tremors. Associated symptoms include fatigue and weakness. Pertinent negatives for diabetes include no chest pain. Her weight is stable. She monitors blood glucose at home 1-2 x per day. Her breakfast blood glucose is taken between 6-7 am. Her breakfast blood glucose range is generally 70-90 mg/dl. Her dinner blood glucose is taken between 4-5 pm. Her dinner  blood glucose range is generally 130-140 mg/dl.    @ Lab Results  Component Value Date   CREATININE 0.97 06/10/2019   BUN 12 06/10/2019   NA 131 (L) 06/10/2019   K 4.5 06/10/2019   CL 92 (L) 06/10/2019   CO2 23 06/10/2019   @ Lab Results  Component Value Date   CHOL 223 (H) 12/15/2018   HDL 109 12/15/2018   LDLCALC 101 (H) 12/15/2018   TRIG 67 12/15/2018   CHOLHDL 2.0 12/15/2018   @ Lab Results  Component Value Date   TSH 1.880 12/15/2018   @ Lab Results  Component Value Date   HGBA1C 7.6 (H) 06/10/2019     Review of Systems  Constitutional: Positive for fatigue. Negative for chills, diaphoresis, fever and unexpected weight change.  HENT: Negative for trouble swallowing.   Respiratory: Negative for cough, chest tightness and wheezing.   Cardiovascular: Negative for chest pain, palpitations and leg swelling.  Gastrointestinal: Negative for abdominal pain, constipation and diarrhea.  Neurological: Positive for weakness and light-headedness. Negative for dizziness, tremors, seizures, syncope and headaches.  Psychiatric/Behavioral: Positive for decreased concentration and depression. Negative for sleep disturbance. The patient is nervous/anxious.     Patient Active Problem List   Diagnosis Date Noted  . 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 Bilateral carotid artery stenosis 08/24/2018  . Atherosclerosis of aorta ([place]) 08/24/2018  . Type 2 diabetes mellitus with stage  3 chronic kidney disease, with long-term current use of insulin ([place]) 08/18/2018  . Bradycardia 07/24/2018  . Neoplasm of uncertain behavior of skin 01/16/2017  . 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 Essential (primary) hypertension 05/08/2015  . History of rectal cancer 05/08/2015  . Hyperlipidemia associated with type 2 diabetes mellitus ([place]) 05/08/2015  . Cerebral seizure ([place]) 05/08/2015  . Shoulder strain 05/08/2015  . Avitaminosis D 05/08/2015    Allergies  Allergen Reactions  .  Pioglitazone Nausea Only    Past Surgical History:  Procedure Laterality Date  . COLOSTOMY REVERSAL  01/2014  . ILEOSTOMY  05/2013  . UMBILICAL HERNIA REPAIR  05/2018    Social History   Tobacco Use  . Smoking status: Never Smoker  . Smokeless tobacco: Never Used  Substance Use Topics  . Alcohol use: No    Alcohol/week: 0.0 standard drinks  . Drug use: No     Medication list has been reviewed and updated.  Current Meds  Medication Sig  . aspirin 81 MG chewable tablet Chew 81 mg by mouth daily.   Marland Kitchen atorvastatin (LIPITOR) 40 MG tablet TAKE 1 TABLET BY MOUTH DAILY  . calcium carbonate (OSCAL) 1500 (600 Ca) MG TABS tablet Take 600 mg of elemental calcium by mouth daily.  . carbamazepine (TEGRETOL) 200 MG tablet Take 1 tablet (200 mg total) by mouth daily.  . Cholecalciferol (D3 HIGH POTENCY) 1000 UNITS capsule Take 1,000 Units by mouth daily.   . Fluocinolone Acetonide 0.01 % OIL INSTILL 4 DROPS INTO BOTH EARS THREE TIMES DAILY AS NEEDED FOR DRYNESS AND ITCHING  . glucose blood test strip Use to test blood sugar up to twice daily.  . hydrALAZINE (APRESOLINE) 25 MG tablet TAKE 1 TABLET BY MOUTH EVERY 8 HOURS (Patient taking differently: Take 25 mg by mouth 2 (two) times daily. )  . hydrochlorothiazide (MICROZIDE) 12.5 MG capsule TAKE 1 CAPSULE(12.5 MG) BY MOUTH DAILY  . Insulin Detemir (LEVEMIR FLEXTOUCH) 100 UNIT/ML Pen Inject 25 Units into the skin at bedtime. (Patient taking differently: Inject 22 Units into the skin at bedtime. )  . meloxicam (MOBIC) 15 MG tablet Take 15 mg by mouth as needed for pain.  . metFORMIN (GLUCOPHAGE-XR) 500 MG 24 hr tablet TAKE 2 TABLETS BY MOUTH TWICE DAILY  . metoprolol succinate (TOPROL-XL) 25 MG 24 hr tablet Take 0.5 tablets (12.5 mg total) by mouth every 8 (eight) hours.  . mometasone (ELOCON) 0.1 % ointment Apply topically daily. Uses for ear drops  . nitroGLYCERIN (NITROSTAT) 0.4 MG SL tablet Place 1 tablet (0.4 mg total) under the tongue every  5 (five) minutes x 3 doses as needed for chest pain.  Marland Kitchen Propylene Glycol (SYSTANE BALANCE OP) Apply to eye.  . ramipril (ALTACE) 5 MG capsule TAKE 1 CAPSULE BY MOUTH DAILY    PHQ 2/9 Scores 09/26/2019 06/10/2019 12/01/2018 09/21/2017  PHQ - 2 Score 3 3 2 1   PHQ- 9 Score 12 9 4  -    BP Readings from Last 3 Encounters:  09/26/19 112/68  06/10/19 130/74  12/15/18 118/70    Physical Exam Vitals signs and nursing note reviewed.  Constitutional:      General: She is not in acute distress.    Appearance: Normal appearance. She is well-developed.  HENT:     Head: Normocephalic and atraumatic.  Neck:     Musculoskeletal: Normal range of motion. No edema.     Thyroid: No thyroid mass or thyromegaly.     Vascular: No carotid bruit.  Cardiovascular:  Rate and Rhythm: Normal rate and regular rhythm.  No extrasystoles are present.    Pulses: Normal pulses.     Heart sounds: Normal heart sounds. No S3 sounds.   Pulmonary:     Effort: Pulmonary effort is normal. No respiratory distress.  Musculoskeletal: Normal range of motion.     Right lower leg: No edema.     Left lower leg: No edema.  Lymphadenopathy:     Cervical: No cervical adenopathy.  Skin:    General: Skin is warm and dry.     Capillary Refill: Capillary refill takes less than 2 seconds.     Findings: No rash.  Neurological:     General: No focal deficit present.     Mental Status: She is alert and oriented to person, place, and time.  Psychiatric:        Attention and Perception: Attention normal.        Mood and Affect: Mood is anxious.        Speech: Speech normal.        Behavior: Behavior normal.        Thought Content: Thought content normal.        Cognition and Memory: Memory is impaired (mild confusion during the encounter - appears to be related to anxiety and the rush to get thought out.).        Judgment: Judgment normal.     Wt Readings from Last 3 Encounters:  09/26/19 155 lb (70.3 kg)  06/10/19 157 lb  (71.2 kg)  12/15/18 156 lb (70.8 kg)    BP 112/68   Pulse 74   Ht 5\' 2"  (1.575 m)   Wt 155 lb (70.3 kg)   SpO2 97%   BMI 28.35 kg/m   Assessment and Plan: 1. Syncope, near I believe this was low blood sugar related to increased activity keeping her son's dog.  She did not check her BS at the time of the sx which did resolve on their own.  If she has similar sx again, she should try to check her blood sugar.  In the meantime, will decrease insulin dose to 18 units - the thought is to allow slightly higher BS rather than risk low BSs. Will check labs  - CBC with Differential/Platelet - Comprehensive metabolic panel - Carbamazepine Level (Tegretol), total  2. Bilateral carotid artery stenosis Due for one year follow up Continue Aspirin and high intensity statin therapy - US Carotid Duplex Bilateral; Future  3. Type II diabetes mellitus with complication (HCC) Reduce insulin dose to 18 units Check BS regularly  4. Mood disorder ([place]) Due to recent events - does not appear to be a long term issues but pt is urged to follow up if symptoms are persistent   Partially dictated using Editor, commissioning. Any errors are unintentional.  Halina Maidens, MD [facility]  09/26/2019

## 2019-09-27 LAB — CBC WITH DIFFERENTIAL/PLATELET
Basophils Absolute: 0 10*3/uL (ref 0.0–0.2)
Basos: 1 %
EOS (ABSOLUTE): 0.1 10*3/uL (ref 0.0–0.4)
Eos: 1 %
Hematocrit: 34.5 % (ref 34.0–46.6)
Hemoglobin: 11.6 g/dL (ref 11.1–15.9)
Immature Grans (Abs): 0 10*3/uL (ref 0.0–0.1)
Immature Granulocytes: 0 %
Lymphocytes Absolute: 0.8 10*3/uL (ref 0.7–3.1)
Lymphs: 12 %
MCH: 29.4 pg (ref 26.6–33.0)
MCHC: 33.6 g/dL (ref 31.5–35.7)
MCV: 88 fL (ref 79–97)
Monocytes Absolute: 0.8 10*3/uL (ref 0.1–0.9)
Monocytes: 12 %
Neutrophils Absolute: 5.1 10*3/uL (ref 1.4–7.0)
Neutrophils: 74 %
Platelets: 289 10*3/uL (ref 150–450)
RBC: 3.94 x10E6/uL (ref 3.77–5.28)
RDW: 12.6 % (ref 11.7–15.4)
WBC: 6.9 10*3/uL (ref 3.4–10.8)

## 2019-09-27 LAB — COMPREHENSIVE METABOLIC PANEL
ALT: 14 IU/L (ref 0–32)
AST: 16 IU/L (ref 0–40)
Albumin/Globulin Ratio: 2.2 (ref 1.2–2.2)
Albumin: 4.3 g/dL (ref 3.7–4.7)
Alkaline Phosphatase: 121 IU/L — ABNORMAL HIGH (ref 39–117)
BUN/Creatinine Ratio: 15 (ref 12–28)
BUN: 17 mg/dL (ref 8–27)
Bilirubin Total: <0.2 mg/dL (ref 0.0–1.2)
CO2: 24 mmol/L (ref 20–29)
Calcium: 9.4 mg/dL (ref 8.7–10.3)
Chloride: 98 mmol/L (ref 96–106)
Creatinine, Ser: 1.11 mg/dL — ABNORMAL HIGH (ref 0.57–1.00)
GFR calc Af Amer: 57 mL/min/{1.73_m2} — ABNORMAL LOW (ref >59)
GFR calc non Af Amer: 49 mL/min/{1.73_m2} — ABNORMAL LOW (ref >59)
Globulin, Total: 2 g/dL (ref 1.5–4.5)
Glucose: 157 mg/dL — ABNORMAL HIGH (ref 65–99)
Potassium: 4.8 mmol/L (ref 3.5–5.2)
Sodium: 137 mmol/L (ref 134–144)
Total Protein: 6.3 g/dL (ref 6.0–8.5)

## 2019-09-27 LAB — CARBAMAZEPINE LEVEL, TOTAL: Carbamazepine (Tegretol), S: 4.8 ug/mL (ref 4.0–12.0)

## 2019-09-29 ENCOUNTER — Ambulatory Visit
Admission: RE | Admit: 2019-09-29 | Discharge: 2019-09-29 | Disposition: A | Discharge status: Home / Self Care | Payer: Medicare Other | Source: Ambulatory Visit | Attending: Internal Medicine | Admitting: Internal Medicine

## 2019-09-29 ENCOUNTER — Other Ambulatory Visit: Payer: Self-pay

## 2019-09-29 DIAGNOSIS — I6523 Occlusion and stenosis of bilateral carotid arteries: Secondary | ICD-10-CM | POA: Insufficient documentation

## 2019-10-03 NOTE — Progress Notes (Signed)
Patient informed and will take aspirin and atorvastatin. Recheck in 1 year.

## 2019-10-06 DIAGNOSIS — H6063 Unspecified chronic otitis externa, bilateral: Secondary | ICD-10-CM | POA: Diagnosis not present

## 2019-10-06 DIAGNOSIS — H6123 Impacted cerumen, bilateral: Secondary | ICD-10-CM | POA: Diagnosis not present

## 2019-10-20 ENCOUNTER — Other Ambulatory Visit: Payer: Self-pay

## 2019-10-20 DIAGNOSIS — Z20822 Contact with and (suspected) exposure to covid-19: Secondary | ICD-10-CM

## 2019-10-23 LAB — NOVEL CORONAVIRUS, NAA: SARS-CoV-2, NAA: NOT DETECTED

## 2019-11-03 LAB — HM DIABETES EYE EXAM

## 2019-11-05 ENCOUNTER — Other Ambulatory Visit: Payer: Self-pay | Admitting: Internal Medicine

## 2019-11-29 DIAGNOSIS — R921 Mammographic calcification found on diagnostic imaging of breast: Secondary | ICD-10-CM | POA: Diagnosis not present

## 2019-11-29 DIAGNOSIS — R928 Other abnormal and inconclusive findings on diagnostic imaging of breast: Secondary | ICD-10-CM | POA: Diagnosis not present

## 2019-12-05 ENCOUNTER — Other Ambulatory Visit: Payer: Self-pay

## 2019-12-05 ENCOUNTER — Ambulatory Visit (INDEPENDENT_AMBULATORY_CARE_PROVIDER_SITE_OTHER): Payer: Medicare Other

## 2019-12-05 VITALS — BP 130/58 | HR 76 | Temp 96.7°F | Resp 15 | Ht 62.0 in | Wt 156.4 lb

## 2019-12-05 DIAGNOSIS — Z0000 Encounter for general adult medical examination without abnormal findings: Secondary | ICD-10-CM | POA: Diagnosis not present

## 2019-12-05 NOTE — Progress Notes (Signed)
Subjective:   [name] is a 74 y.o. female who presents for Medicare Annual (Subsequent) preventive examination.  Review of Systems:   Cardiac Risk Factors include: advanced age (>53men, >41 women);diabetes mellitus;dyslipidemia;hypertension     Objective:     Vitals: BP (!) 130/58 (BP Location: Left Arm, Patient Position: Sitting, Cuff Size: Normal)   Pulse 76   Temp (!) 96.7 F (35.9 C) (Temporal)   Resp 15   Ht 5\' 2"  (1.575 m)   Wt 156 lb 6.4 oz (70.9 kg)   SpO2 99%   BMI 28.61 kg/m   Body mass index is 28.61 kg/m.  Advanced Directives 12/05/2019 12/01/2018 07/24/2018 07/24/2018 04/20/2018 04/19/2018 09/21/2017  Does Patient Have a Medical Advance Directive? No Yes No No No No No  Does patient want to make changes to medical advance directive? - (No Data) - - - - -  Would patient like information on creating a medical advance directive? No - Patient declined - No - Patient declined - No - Patient declined - Yes (MAU/Ambulatory/Procedural Areas - Information given)    Tobacco Social History   Tobacco Use  Smoking Status Never Smoker  Smokeless Tobacco Never Used     Counseling given: Not Answered   Clinical Intake:  Pre-visit preparation completed: Yes  Pain : 0-10 Pain Score: 4  Pain Type: Chronic pain Pain Location: Wrist Pain Orientation: Right Pain Descriptors / Indicators: Discomfort, Aching(arthritis) Pain Onset: 1 to 4 weeks ago Pain Frequency: Intermittent     BMI - recorded: 28.61 Nutritional Status: BMI 25 -29 Overweight Nutritional Risks: Nausea/ vomitting/ diarrhea(occasional diarrhea) Diabetes: Yes CBG done?: No Did pt. bring in CBG monitor from home?: No   Nutrition Risk Assessment:  Has the patient had any N/V/D within the last 2 months?  Yes  - occasional diarrhea Does the patient have any non-healing wounds?  No  Has the patient had any unintentional weight loss or weight gain?  No   Diabetes:  Is the patient diabetic?  Yes  If  diabetic, was a CBG obtained today?  No  Did the patient bring in their glucometer from home?  No  How often do you monitor your CBG's? Daily fasting AM.   Financial Strains and Diabetes Management:  Are you having any financial strains with the device, your supplies or your medication? No .  Does the patient want to be seen by Chronic Care Management for management of their diabetes?  No  Would the patient like to be referred to a Nutritionist or for Diabetic Management?  No   Diabetic Exams:  Diabetic Eye Exam: Completed 10/2019 per patient. Need records from Dr. Mallie Mussel. Pt to sign record release at checkout.   Diabetic Foot Exam: Completed 12/15/18.   How often do you need to have someone help you when you read instructions, pamphlets, or other written materials from your doctor or pharmacy?: 1 - Never  Interpreter Needed?: No  Information entered by :: Clemetine Marker LPN  Past Medical History:  Diagnosis Date  . Allergy   . Arthritis of knee, degenerative 05/08/2015  . Basal cell carcinoma 03/2019   nose  . Colon cancer ([place]) 2014   Partial colon resection, chemo + rad tx's.   . Diabetes mellitus without complication ([place])    Pt takes Metformin.  Marland Kitchen Hyperlipidemia   . Hypertension   . SBO (small bowel obstruction) ([place]) 04/20/2018  . Seizures ([place])   . Umbilical hernia with obstruction but no gangrene   .  Vitamin D deficiency    Past Surgical History:  Procedure Laterality Date  . COLOSTOMY REVERSAL  01/2014  . ILEOSTOMY  05/2013  . UMBILICAL HERNIA REPAIR  05/2018   Family History  Problem Relation Age of Onset  . Cancer Mother        unknown type  . Diabetes Daughter   . Diabetes Son    Social History   Socioeconomic History  . Marital status: Widowed    Spouse name: Not on file  . Number of children: 3  . Years of education: Not on file  . Highest education level: Some college, no degree  Occupational History  . Not on file  Tobacco Use  . Smoking status:  Never Smoker  . Smokeless tobacco: Never Used  Substance and Sexual Activity  . Alcohol use: No    Alcohol/week: 0.0 standard drinks  . Drug use: No  . Sexual activity: Not Currently  Other Topics Concern  . Not on file  Social History Narrative  . Not on file   Social Determinants of Health   Financial Resource Strain: Low Risk   . Difficulty of Paying Living Expenses: Not very hard  Food Insecurity: No Food Insecurity  . Worried About Charity fundraiser in the Last Year: Never true  . Ran Out of Food in the Last Year: Never true  Transportation Needs:   . Lack of Transportation (Medical): Not on file  . Lack of Transportation (Non-Medical): Not on file  Physical Activity:   . Days of Exercise per Week: Not on file  . Minutes of Exercise per Session: Not on file  Stress: No Stress Concern Present  . Feeling of Stress : Only a little  Social Connections: Somewhat Isolated  . Frequency of Communication with Friends and Family: More than three times a week  . Frequency of Social Gatherings with Friends and Family: More than three times a week  . Attends Religious Services: More than 4 times per year  . Active Member of Clubs or Organizations: No  . Attends Archivist Meetings: Never  . Marital Status: Widowed    Outpatient Encounter Medications as of 12/05/2019  Medication Sig  . aspirin 81 MG chewable tablet Chew 81 mg by mouth daily.   Marland Kitchen atorvastatin (LIPITOR) 40 MG tablet TAKE 1 TABLET BY MOUTH DAILY  . calcium carbonate (OSCAL) 1500 (600 Ca) MG TABS tablet Take 600 mg of elemental calcium by mouth daily.  . carbamazepine (TEGRETOL) 200 MG tablet Take 1 tablet (200 mg total) by mouth daily.  . Cholecalciferol (D3 HIGH POTENCY) 1000 UNITS capsule Take 1,000 Units by mouth daily.   . Fluocinolone Acetonide 0.01 % OIL INSTILL 4 DROPS INTO BOTH EARS THREE TIMES DAILY AS NEEDED FOR DRYNESS AND ITCHING  . glucose blood test strip Use to test blood sugar up to twice  daily.  . hydrALAZINE (APRESOLINE) 25 MG tablet TAKE 1 TABLET BY MOUTH EVERY 8 HOURS (Patient taking differently: Take 25 mg by mouth 2 (two) times daily. )  . hydrochlorothiazide (MICROZIDE) 12.5 MG capsule TAKE 1 CAPSULE(12.5 MG) BY MOUTH DAILY  . Insulin Detemir (LEVEMIR FLEXTOUCH) 100 UNIT/ML Pen Inject 25 Units into the skin at bedtime. (Patient taking differently: Inject 20 Units into the skin at bedtime. )  . meloxicam (MOBIC) 15 MG tablet Take 15 mg by mouth as needed for pain.  . metFORMIN (GLUCOPHAGE-XR) 500 MG 24 hr tablet TAKE 2 TABLETS BY MOUTH TWICE DAILY  . metoprolol succinate (  TOPROL-XL) 25 MG 24 hr tablet Take 0.5 tablets (12.5 mg total) by mouth every 8 (eight) hours.  . nitroGLYCERIN (NITROSTAT) 0.4 MG SL tablet Place 1 tablet (0.4 mg total) under the tongue every 5 (five) minutes x 3 doses as needed for chest pain.  Marland Kitchen Propylene Glycol (SYSTANE BALANCE OP) Apply to eye.  . ramipril (ALTACE) 5 MG capsule TAKE 1 CAPSULE BY MOUTH DAILY  . [DISCONTINUED] mometasone (ELOCON) 0.1 % ointment Apply topically daily. Uses for ear drops   No facility-administered encounter medications on file as of 12/05/2019.    Activities of Daily Living In your present state of health, do you have any difficulty performing the following activities: 12/05/2019  Hearing? Y  Comment attributed to wax build up at times  Vision? N  Difficulty concentrating or making decisions? Y  Walking or climbing stairs? N  Dressing or bathing? N  Doing errands, shopping? N  Preparing Food and eating ? N  Using the Toilet? N  In the past six months, have you accidently leaked urine? Y  Comment wears pads for protection  Do you have problems with loss of bowel control? N  Managing your Medications? N  Managing your Finances? N  Housekeeping or managing your Housekeeping? N  Some recent data might be hidden    Patient Care Team: Glean Hess, MD as PCP - General (Internal Medicine) Gwinda Maine, MD as  Referring Physician (Oncology) Zafar, Rondel Jumbo (Inactive) as Consulting Physician Isaias Cowman, MD as Consulting Physician (Cardiology) Carloyn Manner, MD as Referring Physician (Otolaryngology) Baker Pierini, OD (Optometry) Jannet Mantis, MD (Dermatology)    Assessment:   This is a routine wellness examination for Chrystal.  Exercise Activities and Dietary recommendations Current Exercise Habits: The patient does not participate in regular exercise at present, Exercise limited by: orthopedic condition(s)  Goals    . Exercise 150 minutes per week (moderate activity)     Recommend to increase exercise from 1 day/20 minutes per week to 5 days/30 minutes per week       Fall Risk Fall Risk  12/05/2019 06/10/2019 12/01/2018 10/05/2018 09/21/2017  Falls in the past year? 0 1 0 0 Yes  Comment - - - Emmi Telephone Survey: data to providers prior to load -  Number falls in past yr: 0 1 0 - 2 or more  Injury with Fall? 0 1 - - No  Risk for fall due to : Impaired balance/gait History of fall(s);Impaired balance/gait - - -  Follow up Falls prevention discussed Falls evaluation completed;Falls prevention discussed - - Education provided;Falls prevention discussed   FALL RISK PREVENTION PERTAINING TO THE HOME:  Any stairs in or around the home? Yes  If so, do they handrails? Yes   Home free of loose throw rugs in walkways, pet beds, electrical cords, etc? Yes  Adequate lighting in your home to reduce risk of falls? Yes   ASSISTIVE DEVICES UTILIZED TO PREVENT FALLS:  Life alert? No  Use of a cane, walker or w/c? No  Grab bars in the bathroom? Yes  Shower chair or bench in shower? Yes  Elevated toilet seat or a handicapped toilet? Yes   DME ORDERS:  DME order needed?  No   TIMED UP AND GO:  Was the test performed? Yes .  Length of time to ambulate 10 feet: 6 sec.   GAIT:  Appearance of gait: Gait stead-fast and without the use of an assistive device.    Education: Fall risk prevention  has been discussed.  Intervention(s) required? No    Depression Screen PHQ 2/9 Scores 12/05/2019 09/26/2019 06/10/2019 12/01/2018  PHQ - 2 Score 1 3 3 2   PHQ- 9 Score 4 12 9 4      Cognitive Function     6CIT Screen 12/05/2019 12/01/2018 09/21/2017  What Year? 0 points 0 points 0 points  What month? 0 points 0 points 0 points  What time? 0 points 0 points 0 points  Count back from 20 0 points 0 points 0 points  Months in reverse 0 points 0 points 0 points  Repeat phrase 0 points 0 points 2 points  Total Score 0 0 2    Immunization History  Administered Date(s) Administered  . Fluad Quad(high Dose 65+) 08/29/2019  . Influenza, High Dose Seasonal PF 09/21/2017, 09/02/2018  . Influenza,inj,Quad PF,6+ Mos 08/20/2015, 09/17/2016  . Pneumococcal Conjugate-13 10/22/2015  . Pneumococcal Polysaccharide-23 07/17/2017  . Tdap 12/19/2014    Qualifies for Shingles Vaccine? Yes . Due for Shingrix. Education has been provided regarding the importance of this vaccine. Pt has been advised to call insurance company to determine out of pocket expense. Advised may also receive vaccine at local pharmacy or Health Dept. Verbalized acceptance and understanding.  Tdap: Up to date  Flu Vaccine: Up to date  Pneumococcal Vaccine: Up to date   Screening Tests Health Maintenance  Topic Date Due  . OPHTHALMOLOGY EXAM  08/25/2019  . HEMOGLOBIN A1C  12/11/2019  . FOOT EXAM  12/16/2019  . COLONOSCOPY  06/12/2020  . MAMMOGRAM  11/28/2020  . TETANUS/TDAP  12/19/2024  . INFLUENZA VACCINE  Completed  . Hepatitis C Screening  Completed  . PNA vac Low Risk Adult  Completed  . DEXA SCAN  Addressed    Cancer Screenings:  Colorectal Screening: Completed 06/12/17. Repeat every 3 years; [facility] - Dr. Ronita Hipps  Mammogram: Completed 11/29/19. Repeat every year;   Bone Density: Completed 04/25/14. Results reflect NORMAL. Repeat every 2 years. Pt declines repeat screening at this  time.   Lung Cancer Screening: (Low Dose CT Chest recommended if Age 18-80 years, 30 pack-year currently smoking OR have quit w/in 15years.) does not qualify.   Additional Screening:  Hepatitis C Screening: does qualify; Completed 12/02/17  Vision Screening: Recommended annual ophthalmology exams for early detection of glaucoma and other disorders of the eye. Is the patient up to date with their annual eye exam?  Yes  Who is the provider or what is the name of the office in which the pt attends annual eye exams? Dr. Mallie Mussel  Dental Screening: Recommended annual dental exams for proper oral hygiene  Community Resource Referral:  CRR required this visit?  No      Plan:     I have personally reviewed and addressed the Medicare Annual Wellness questionnaire and have noted the following in the patient's chart:  A. Medical and social history B. Use of alcohol, tobacco or illicit drugs  C. Current medications and supplements D. Functional ability and status E.  Nutritional status F.  Physical activity G. Advance directives H. List of other physicians I.  Hospitalizations, surgeries, and ER visits in previous 12 months J.  [place] such as hearing and vision if needed, cognitive and depression L. Referrals and appointments   In addition, I have reviewed and discussed with patient certain preventive protocols, quality metrics, and best practice recommendations. A written personalized care plan for preventive services as well as general preventive health recommendations were provided to patient.  Signed,  Clemetine Marker, LPN Nurse Health Advisor   Nurse Notes: pt c/o arthritis in multiple places, would like to discuss possible referral to ortho at next visit for management.

## 2019-12-05 NOTE — Patient Instructions (Signed)
[name] , Thank you for taking time to come for your Medicare Wellness Visit. I appreciate your ongoing commitment to your health goals. Please review the following plan we discussed and let me know if I can assist you in the future.   Screening recommendations/referrals: Colonoscopy: done 06/12/17. Repeat later this year.  Mammogram: done 11/29/19 Bone Density: done 2015 Recommended yearly ophthalmology/optometry visit for glaucoma screening and checkup Recommended yearly dental visit for hygiene and checkup  Vaccinations: Influenza vaccine: done 08/29/19 Pneumococcal vaccine: done 07/17/17 Tdap vaccine: done 12/19/14 Shingles vaccine: Shingrix discussed. Please contact your pharmacy for coverage information.   Advanced directives: Advance directive discussed with you today. Even though you declined this today please call our office should you change your mind and we can give you the proper paperwork for you to fill out.  Conditions/risks identified: Recommend increasing physical activity.   Next appointment: Please follow up in one year for your Medicare Annual Wellness visit.     Preventive Care 31 Years and Older, Female Preventive care refers to lifestyle choices and visits with your health care provider that can promote health and wellness. What does preventive care include?  A yearly physical exam. This is also called an annual well check.  Dental exams once or twice a year.  Routine eye exams. Ask your health care provider how often you should have your eyes checked.  Personal lifestyle choices, including:  Daily care of your teeth and gums.  Regular physical activity.  Eating a healthy diet.  Avoiding tobacco and drug use.  Limiting alcohol use.  Practicing safe sex.  Taking low-dose aspirin every day.  Taking vitamin and mineral supplements as recommended by your health care provider. What happens during an annual well check? The services and screenings  done by your health care provider during your annual well check will depend on your age, overall health, lifestyle risk factors, and family history of disease. Counseling  Your health care provider may ask you questions about your:  Alcohol use.  Tobacco use.  Drug use.  Emotional well-being.  Home and relationship well-being.  Sexual activity.  Eating habits.  History of falls.  Memory and ability to understand (cognition).  Work and work Statistician.  Reproductive health. Screening  You may have the following tests or measurements:  Height, weight, and BMI.  Blood pressure.  Lipid and cholesterol levels. These may be checked every 5 years, or more frequently if you are over 96 years old.  Skin check.  Lung cancer screening. You may have this screening every year starting at age 43 if you have a 30-pack-year history of smoking and currently smoke or have quit within the past 15 years.  Fecal occult blood test (FOBT) of the stool. You may have this test every year starting at age 53.  Flexible sigmoidoscopy or colonoscopy. You may have a sigmoidoscopy every 5 years or a colonoscopy every 10 years starting at age 35.  Hepatitis C blood test.  Hepatitis B blood test.  Sexually transmitted disease (STD) testing.  Diabetes screening. This is done by checking your blood sugar (glucose) after you have not eaten for a while (fasting). You may have this done every 1-3 years.  Bone density scan. This is done to screen for osteoporosis. You may have this done starting at age 35.  Mammogram. This may be done every 1-2 years. Talk to your health care provider about how often you should have regular mammograms. Talk with your health care provider about  your test results, treatment options, and if necessary, the need for more tests. Vaccines  Your health care provider may recommend certain vaccines, such as:  Influenza vaccine. This is recommended every year.  Tetanus,  diphtheria, and acellular pertussis (Tdap, Td) vaccine. You may need a Td booster every 10 years.  Zoster vaccine. You may need this after age 32.  Pneumococcal 13-valent conjugate (PCV13) vaccine. One dose is recommended after age 30.  Pneumococcal polysaccharide (PPSV23) vaccine. One dose is recommended after age 31. Talk to your health care provider about which screenings and vaccines you need and how often you need them. This information is not intended to replace advice given to you by your health care provider. Make sure you discuss any questions you have with your health care provider. Document Released: 11/30/2015 Document Revised: 07/23/2016 Document Reviewed: 09/04/2015 Elsevier Interactive Patient Education  2017 [place] Prevention in the Home Falls can cause injuries. They can happen to people of all ages. There are many things you can do to make your home safe and to help prevent falls. What can I do on the outside of my home?  Regularly fix the edges of walkways and driveways and fix any cracks.  Remove anything that might make you trip as you walk through a door, such as a raised step or threshold.  Trim any bushes or trees on the path to your home.  Use bright outdoor lighting.  Clear any walking paths of anything that might make someone trip, such as rocks or tools.  Regularly check to see if handrails are loose or broken. Make sure that both sides of any steps have handrails.  Any raised decks and porches should have guardrails on the edges.  Have any leaves, snow, or ice cleared regularly.  Use sand or salt on walking paths during winter.  Clean up any spills in your garage right away. This includes oil or grease spills. What can I do in the bathroom?  Use night lights.  Install grab bars by the toilet and in the tub and shower. Do not use towel bars as grab bars.  Use non-skid mats or decals in the tub or shower.  If you need to sit down in  the shower, use a plastic, non-slip stool.  Keep the floor dry. Clean up any water that spills on the floor as soon as it happens.  Remove soap buildup in the tub or shower regularly.  Attach bath mats securely with double-sided non-slip rug tape.  Do not have throw rugs and other things on the floor that can make you trip. What can I do in the bedroom?  Use night lights.  Make sure that you have a light by your bed that is easy to reach.  Do not use any sheets or blankets that are too big for your bed. They should not hang down onto the floor.  Have a firm chair that has side arms. You can use this for support while you get dressed.  Do not have throw rugs and other things on the floor that can make you trip. What can I do in the kitchen?  Clean up any spills right away.  Avoid walking on wet floors.  Keep items that you use a lot in easy-to-reach places.  If you need to reach something above you, use a strong step stool that has a grab bar.  Keep electrical cords out of the way.  Do not use floor polish or wax  that makes floors slippery. If you must use wax, use non-skid floor wax.  Do not have throw rugs and other things on the floor that can make you trip. What can I do with my stairs?  Do not leave any items on the stairs.  Make sure that there are handrails on both sides of the stairs and use them. Fix handrails that are broken or loose. Make sure that handrails are as long as the stairways.  Check any carpeting to make sure that it is firmly attached to the stairs. Fix any carpet that is loose or worn.  Avoid having throw rugs at the top or bottom of the stairs. If you do have throw rugs, attach them to the floor with carpet tape.  Make sure that you have a light switch at the top of the stairs and the bottom of the stairs. If you do not have them, ask someone to add them for you. What else can I do to help prevent falls?  Wear shoes that:  Do not have high  heels.  Have rubber bottoms.  Are comfortable and fit you well.  Are closed at the toe. Do not wear sandals.  If you use a stepladder:  Make sure that it is fully opened. Do not climb a closed stepladder.  Make sure that both sides of the stepladder are locked into place.  Ask someone to hold it for you, if possible.  Clearly mark and make sure that you can see:  Any grab bars or handrails.  First and last steps.  Where the edge of each step is.  Use tools that help you move around (mobility aids) if they are needed. These include:  Canes.  Walkers.  Scooters.  Crutches.  Turn on the lights when you go into a dark area. Replace any light bulbs as soon as they burn out.  Set up your furniture so you have a clear path. Avoid moving your furniture around.  If any of your floors are uneven, fix them.  If there are any pets around you, be aware of where they are.  Review your medicines with your doctor. Some medicines can make you feel dizzy. This can increase your chance of falling. Ask your doctor what other things that you can do to help prevent falls. This information is not intended to replace advice given to you by your health care provider. Make sure you discuss any questions you have with your health care provider. Document Released: 08/30/2009 Document Revised: 04/10/2016 Document Reviewed: 12/08/2014 Elsevier Interactive Patient Education  2017 [employer].

## 2019-12-07 ENCOUNTER — Encounter: Payer: Self-pay | Admitting: Internal Medicine

## 2019-12-20 DIAGNOSIS — H25813 Combined forms of age-related cataract, bilateral: Secondary | ICD-10-CM | POA: Diagnosis not present

## 2019-12-21 ENCOUNTER — Encounter: Payer: Self-pay | Admitting: Internal Medicine

## 2019-12-21 ENCOUNTER — Ambulatory Visit (INDEPENDENT_AMBULATORY_CARE_PROVIDER_SITE_OTHER): Payer: Medicare Other | Admitting: Internal Medicine

## 2019-12-21 ENCOUNTER — Other Ambulatory Visit: Payer: Self-pay

## 2019-12-21 VITALS — BP 128/64 | HR 81 | Temp 98.3°F | Ht 62.0 in | Wt 158.0 lb

## 2019-12-21 DIAGNOSIS — F39 Unspecified mood [affective] disorder: Secondary | ICD-10-CM | POA: Insufficient documentation

## 2019-12-21 DIAGNOSIS — E785 Hyperlipidemia, unspecified: Secondary | ICD-10-CM | POA: Diagnosis not present

## 2019-12-21 DIAGNOSIS — E118 Type 2 diabetes mellitus with unspecified complications: Secondary | ICD-10-CM | POA: Diagnosis not present

## 2019-12-21 DIAGNOSIS — E1169 Type 2 diabetes mellitus with other specified complication: Secondary | ICD-10-CM | POA: Diagnosis not present

## 2019-12-21 DIAGNOSIS — E559 Vitamin D deficiency, unspecified: Secondary | ICD-10-CM

## 2019-12-21 DIAGNOSIS — Z0000 Encounter for general adult medical examination without abnormal findings: Secondary | ICD-10-CM

## 2019-12-21 DIAGNOSIS — I700 Atherosclerosis of aorta: Secondary | ICD-10-CM | POA: Diagnosis not present

## 2019-12-21 DIAGNOSIS — I10 Essential (primary) hypertension: Secondary | ICD-10-CM | POA: Diagnosis not present

## 2019-12-21 DIAGNOSIS — G40909 Epilepsy, unspecified, not intractable, without status epilepticus: Secondary | ICD-10-CM

## 2019-12-21 LAB — POCT URINALYSIS DIPSTICK
Bilirubin, UA: NEGATIVE
Blood, UA: NEGATIVE
Glucose, UA: NEGATIVE
Ketones, UA: NEGATIVE
Leukocytes, UA: NEGATIVE
Nitrite, UA: NEGATIVE
Protein, UA: NEGATIVE
Spec Grav, UA: 1.015 (ref 1.010–1.025)
Urobilinogen, UA: 0.2 E.U./dL
pH, UA: 5 (ref 5.0–8.0)

## 2019-12-21 NOTE — Progress Notes (Signed)
Date:  12/21/2019   Name:  [name]   DOB:  1945-12-26   MRN:  [id number]   Chief Complaint: Annual Exam (Medicare Yearly- breast exam. ) and Diabetes (Foot exam.) [name] is a 74 y.o. female who presents today for her Complete Annual Exam. She feels fairly well. She reports exercising rarely. She reports she is sleeping well. She denies breast issues and just had a mammogram.  She is going to have cataract surgery soon.  Mammogram 11/2019 DEXA  04/2014 Colonoscopy 05/2017 repeat 3 yrs Immunization History  Administered Date(s) Administered  . Fluad Quad(high Dose 65+) 08/29/2019  . Influenza, High Dose Seasonal PF 09/21/2017, 09/02/2018  . Influenza,inj,Quad PF,6+ Mos 08/20/2015, 09/17/2016  . Pneumococcal Conjugate-13 10/22/2015  . Pneumococcal Polysaccharide-23 07/17/2017  . Tdap 12/19/2014    Diabetes She presents for her follow-up diabetic visit. She has type 2 diabetes mellitus. Pertinent negatives for hypoglycemia include no dizziness, headaches, nervousness/anxiousness or tremors. Pertinent negatives for diabetes include no chest pain, no fatigue, no polydipsia and no polyuria. She monitors blood glucose at home 1-2 x per day. Her breakfast blood glucose is taken between 6-7 am. Her breakfast blood glucose range is generally 130-140 mg/dl.  Hyperlipidemia This is a chronic problem. The problem is controlled. Pertinent negatives include no chest pain or shortness of breath. Current antihyperlipidemic treatment includes statins. The current treatment provides significant improvement of lipids.  Hypertension This is a chronic problem. The problem is controlled. Pertinent negatives include no chest pain, headaches, palpitations or shortness of breath. Past treatments include ACE inhibitors, diuretics, beta blockers and central alpha agonists.    Lab Results  Component Value Date   CREATININE 1.11 (H) 09/26/2019   BUN 17 09/26/2019   NA 137 09/26/2019   K 4.8  09/26/2019   CL 98 09/26/2019   CO2 24 09/26/2019   Lab Results  Component Value Date   CHOL 223 (H) 12/15/2018   HDL 109 12/15/2018   LDLCALC 101 (H) 12/15/2018   TRIG 67 12/15/2018   CHOLHDL 2.0 12/15/2018   Lab Results  Component Value Date   TSH 1.880 12/15/2018   Lab Results  Component Value Date   HGBA1C 7.6 (H) 06/10/2019     Review of Systems  Constitutional: Negative for chills, fatigue and fever.  HENT: Negative for congestion, hearing loss, tinnitus, trouble swallowing and voice change.   Eyes: Positive for visual disturbance.  Respiratory: Negative for cough, chest tightness, shortness of breath and wheezing.   Cardiovascular: Negative for chest pain, palpitations and leg swelling.  Gastrointestinal: Positive for diarrhea (intermittent since radiation and surgery). Negative for abdominal pain, constipation and vomiting.  Endocrine: Negative for polydipsia and polyuria.  Genitourinary: Negative for dysuria, frequency, genital sores, vaginal bleeding and vaginal discharge.  Musculoskeletal: Positive for arthralgias. Negative for gait problem and joint swelling.  Skin: Negative for color change and rash.  Neurological: Negative for dizziness, tremors, light-headedness and headaches.  Hematological: Negative for adenopathy. Does not bruise/bleed easily.  Psychiatric/Behavioral: Negative for dysphoric mood and sleep disturbance. The patient is not nervous/anxious.     Patient Active Problem List   Diagnosis Date Noted  . 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 Bilateral carotid artery stenosis 08/24/2018  . Atherosclerosis of aorta ([place]) 08/24/2018  . Type II diabetes mellitus with complication ([place]) [id number]  . Bradycardia 07/24/2018  . Neoplasm of uncertain behavior of skin 01/16/2017  . 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 Essential (primary) hypertension 05/08/2015  .  History of rectal cancer 05/08/2015  . Hyperlipidemia associated with type 2  diabetes mellitus ([place]) 05/08/2015  . Seizure disorder ([place]) 05/08/2015  . Shoulder strain 05/08/2015  . Avitaminosis D 05/08/2015    Allergies  Allergen Reactions  . Pioglitazone Nausea Only    Past Surgical History:  Procedure Laterality Date  . COLOSTOMY REVERSAL  01/2014  . ILEOSTOMY  05/2013  . UMBILICAL HERNIA REPAIR  05/2018    Social History   Tobacco Use  . Smoking status: Never Smoker  . Smokeless tobacco: Never Used  Substance Use Topics  . Alcohol use: No    Alcohol/week: 0.0 standard drinks  . Drug use: No     Medication list has been reviewed and updated.  Current Meds  Medication Sig  . aspirin 81 MG chewable tablet Chew 81 mg by mouth daily.   Marland Kitchen atorvastatin (LIPITOR) 40 MG tablet TAKE 1 TABLET BY MOUTH DAILY  . calcium carbonate (OSCAL) 1500 (600 Ca) MG TABS tablet Take 600 mg of elemental calcium by mouth daily.  . carbamazepine (TEGRETOL) 200 MG tablet Take 1 tablet (200 mg total) by mouth daily.  . Cholecalciferol (D3 HIGH POTENCY) 1000 UNITS capsule Take 1,000 Units by mouth daily.   . Fluocinolone Acetonide 0.01 % OIL INSTILL 4 DROPS INTO BOTH EARS THREE TIMES DAILY AS NEEDED FOR DRYNESS AND ITCHING  . glucose blood test strip Use to test blood sugar up to twice daily.  . hydrALAZINE (APRESOLINE) 25 MG tablet TAKE 1 TABLET BY MOUTH EVERY 8 HOURS (Patient taking differently: Take 25 mg by mouth 2 (two) times daily. )  . hydrochlorothiazide (MICROZIDE) 12.5 MG capsule TAKE 1 CAPSULE(12.5 MG) BY MOUTH DAILY  . Insulin Detemir (LEVEMIR FLEXTOUCH) 100 UNIT/ML Pen Inject 25 Units into the skin at bedtime. (Patient taking differently: Inject 20 Units into the skin at bedtime. )  . meloxicam (MOBIC) 15 MG tablet Take 15 mg by mouth as needed for pain.  . metFORMIN (GLUCOPHAGE-XR) 500 MG 24 hr tablet TAKE 2 TABLETS BY MOUTH TWICE DAILY  . metoprolol succinate (TOPROL-XL) 25 MG 24 hr tablet Take 0.5 tablets (12.5 mg total) by mouth every 8 (eight) hours.  .  nitroGLYCERIN (NITROSTAT) 0.4 MG SL tablet Place 1 tablet (0.4 mg total) under the tongue every 5 (five) minutes x 3 doses as needed for chest pain.  Marland Kitchen Propylene Glycol (SYSTANE BALANCE OP) Apply to eye.  . ramipril (ALTACE) 5 MG capsule TAKE 1 CAPSULE BY MOUTH DAILY    PHQ 2/9 Scores 12/21/2019 12/05/2019 09/26/2019 06/10/2019  PHQ - 2 Score 4 1 3 3   PHQ- 9 Score 10 4 12 9     BP Readings from Last 3 Encounters:  12/21/19 128/64  12/05/19 (!) 130/58  09/26/19 112/68    Physical Exam Vitals and nursing note reviewed.  Constitutional:      General: She is not in acute distress.    Appearance: She is well-developed.  HENT:     Head: Normocephalic and atraumatic.     Right Ear: Tympanic membrane and ear canal normal.     Left Ear: Tympanic membrane and ear canal normal.     Nose:     Right Sinus: No maxillary sinus tenderness.     Left Sinus: No maxillary sinus tenderness.  Eyes:     General: No scleral icterus.       Right eye: No discharge.        Left eye: No discharge.     Conjunctiva/sclera: Conjunctivae normal.  Neck:     Thyroid: No thyromegaly.     Vascular: No carotid bruit.  Cardiovascular:     Rate and Rhythm: Normal rate and regular rhythm.     Pulses: Normal pulses.     Heart sounds: Normal heart sounds.  Pulmonary:     Effort: Pulmonary effort is normal. No respiratory distress.     Breath sounds: No wheezing.  Chest:     Breasts:        Right: No mass, nipple discharge, skin change or tenderness.        Left: No mass, nipple discharge, skin change or tenderness.  Abdominal:     General: Bowel sounds are normal.     Palpations: Abdomen is soft.     Tenderness: There is no abdominal tenderness.  Musculoskeletal:     Cervical back: No erythema. Decreased range of motion.     Right lower leg: No edema.     Left lower leg: No edema.  Lymphadenopathy:     Cervical: No cervical adenopathy.  Skin:    General: Skin is warm and dry.     Capillary Refill:  Capillary refill takes less than 2 seconds.     Findings: No rash.  Neurological:     General: No focal deficit present.     Mental Status: She is alert and oriented to person, place, and time.     Cranial Nerves: No cranial nerve deficit.     Sensory: No sensory deficit.     Deep Tendon Reflexes: Reflexes are normal and symmetric.  Psychiatric:        Speech: Speech normal.        Behavior: Behavior normal.        Thought Content: Thought content normal.     Wt Readings from Last 3 Encounters:  12/21/19 158 lb (71.7 kg)  12/05/19 156 lb 6.4 oz (70.9 kg)  09/26/19 155 lb (70.3 kg)    BP 128/64   Pulse 81   Temp 98.3 F (36.8 C) (Oral)   Ht 5\' 2"  (1.575 m)   Wt 158 lb (71.7 kg)   SpO2 97%   BMI 28.90 kg/m   Assessment and Plan: 1. Annual physical exam Stable exam Mammogram done recently  2. Essential (primary) hypertension Clinically stable exam with well controlled BP on 4 medications. Tolerating medications without side effects at this time. Pt to continue current regimen and low sodium diet; benefits of regular exercise as able discussed. - CBC with Differential/Platelet - TSH - POCT urinalysis dipstick  3. Hyperlipidemia associated with type 2 diabetes mellitus ([place]) Tolerating high intensity statin medication without side effects at this time Continue same therapy without change at this time. - Lipid panel  4. Type II diabetes mellitus with complication ([facility]) Clinically stable by exam and report without s/s of hypoglycemia. DM complicated by lipids, htn. Tolerating medications, metformin and Levemir, well without side effects or other concerns. No recurrent episodes of low blood sugar, pre-syncope. - Comprehensive metabolic panel - Hemoglobin A1c  5. Atherosclerosis of aorta ([facility]) On Statin and aspirin  6. Seizure disorder ([place]) None in many years. Will continue medications due to greater benefit vs risk. - Carbamazepine Level (Tegretol), total  7.  Avitaminosis D Continue daily supplementation along with calcium Remain physically active as able - VITAMIN D 25 Hydroxy (Vit-D Deficiency, Fractures)  8. Mood disorder ([place]) Feeling stressed over the news that she needs cataract surgery - she thought it would be several more years and doesn't  want to burden her family. No significant somatic issues, no SI/HI.  No indication to begin anti-depressant medication at this time.   Partially dictated using Editor, commissioning. Any errors are unintentional.  Halina Maidens, MD [facility]  12/21/2019

## 2019-12-21 NOTE — Patient Instructions (Signed)
Covid Vaccine locations: [employer] Dept. [place] [email or web address]

## 2019-12-22 LAB — COMPREHENSIVE METABOLIC PANEL
ALT: 12 IU/L (ref 0–32)
AST: 15 IU/L (ref 0–40)
Albumin/Globulin Ratio: 2.4 — ABNORMAL HIGH (ref 1.2–2.2)
Albumin: 4.5 g/dL (ref 3.7–4.7)
Alkaline Phosphatase: 138 IU/L — ABNORMAL HIGH (ref 39–117)
BUN/Creatinine Ratio: 12 (ref 12–28)
BUN: 12 mg/dL (ref 8–27)
Bilirubin Total: 0.3 mg/dL (ref 0.0–1.2)
CO2: 22 mmol/L (ref 20–29)
Calcium: 9.6 mg/dL (ref 8.7–10.3)
Chloride: 99 mmol/L (ref 96–106)
Creatinine, Ser: 1.02 mg/dL — ABNORMAL HIGH (ref 0.57–1.00)
GFR calc Af Amer: 63 mL/min/{1.73_m2} (ref >59)
GFR calc non Af Amer: 54 mL/min/{1.73_m2} — ABNORMAL LOW (ref >59)
Globulin, Total: 1.9 g/dL (ref 1.5–4.5)
Glucose: 152 mg/dL — ABNORMAL HIGH (ref 65–99)
Potassium: 4.8 mmol/L (ref 3.5–5.2)
Sodium: 139 mmol/L (ref 134–144)
Total Protein: 6.4 g/dL (ref 6.0–8.5)

## 2019-12-22 LAB — CBC WITH DIFFERENTIAL/PLATELET
Basophils Absolute: 0 10*3/uL (ref 0.0–0.2)
Basos: 1 %
EOS (ABSOLUTE): 0.1 10*3/uL (ref 0.0–0.4)
Eos: 1 %
Hematocrit: 36.5 % (ref 34.0–46.6)
Hemoglobin: 12.1 g/dL (ref 11.1–15.9)
Immature Grans (Abs): 0 10*3/uL (ref 0.0–0.1)
Immature Granulocytes: 0 %
Lymphocytes Absolute: 0.8 10*3/uL (ref 0.7–3.1)
Lymphs: 10 %
MCH: 30 pg (ref 26.6–33.0)
MCHC: 33.2 g/dL (ref 31.5–35.7)
MCV: 91 fL (ref 79–97)
Monocytes Absolute: 0.8 10*3/uL (ref 0.1–0.9)
Monocytes: 10 %
Neutrophils Absolute: 6.6 10*3/uL (ref 1.4–7.0)
Neutrophils: 78 %
Platelets: 317 10*3/uL (ref 150–450)
RBC: 4.03 x10E6/uL (ref 3.77–5.28)
RDW: 12.7 % (ref 11.7–15.4)
WBC: 8.4 10*3/uL (ref 3.4–10.8)

## 2019-12-22 LAB — HEMOGLOBIN A1C
Est. average glucose Bld gHb Est-mCnc: 180 mg/dL
Hgb A1c MFr Bld: 7.9 % — ABNORMAL HIGH (ref 4.8–5.6)

## 2019-12-22 LAB — LIPID PANEL
Chol/HDL Ratio: 1.9 ratio (ref 0.0–4.4)
Cholesterol, Total: 184 mg/dL (ref 100–199)
HDL: 96 mg/dL (ref >39)
LDL Chol Calc (NIH): 77 mg/dL (ref 0–99)
Triglycerides: 60 mg/dL (ref 0–149)
VLDL Cholesterol Cal: 11 mg/dL (ref 5–40)

## 2019-12-22 LAB — VITAMIN D 25 HYDROXY (VIT D DEFICIENCY, FRACTURES): Vit D, 25-Hydroxy: 29.5 ng/mL — ABNORMAL LOW (ref 30.0–100.0)

## 2019-12-22 LAB — CARBAMAZEPINE LEVEL, TOTAL: Carbamazepine (Tegretol), S: 6 ug/mL (ref 4.0–12.0)

## 2019-12-22 LAB — TSH: TSH: 1.45 u[IU]/mL (ref 0.450–4.500)

## 2020-01-06 DIAGNOSIS — H6123 Impacted cerumen, bilateral: Secondary | ICD-10-CM | POA: Diagnosis not present

## 2020-01-06 DIAGNOSIS — H6060 Unspecified chronic otitis externa, unspecified ear: Secondary | ICD-10-CM | POA: Diagnosis not present

## 2020-01-13 DIAGNOSIS — H25813 Combined forms of age-related cataract, bilateral: Secondary | ICD-10-CM | POA: Diagnosis not present

## 2020-01-13 DIAGNOSIS — Z20822 Contact with and (suspected) exposure to covid-19: Secondary | ICD-10-CM | POA: Diagnosis not present

## 2020-01-16 DIAGNOSIS — N189 Chronic kidney disease, unspecified: Secondary | ICD-10-CM | POA: Diagnosis not present

## 2020-01-16 DIAGNOSIS — E1122 Type 2 diabetes mellitus with diabetic chronic kidney disease: Secondary | ICD-10-CM | POA: Diagnosis not present

## 2020-01-16 DIAGNOSIS — H25811 Combined forms of age-related cataract, right eye: Secondary | ICD-10-CM | POA: Diagnosis not present

## 2020-01-16 DIAGNOSIS — E1136 Type 2 diabetes mellitus with diabetic cataract: Secondary | ICD-10-CM | POA: Diagnosis not present

## 2020-01-16 DIAGNOSIS — I129 Hypertensive chronic kidney disease with stage 1 through stage 4 chronic kidney disease, or unspecified chronic kidney disease: Secondary | ICD-10-CM | POA: Diagnosis not present

## 2020-01-16 DIAGNOSIS — Z794 Long term (current) use of insulin: Secondary | ICD-10-CM | POA: Diagnosis not present

## 2020-01-16 HISTORY — PX: CATARACT EXTRACTION, BILATERAL: SHX1313

## 2020-01-24 DIAGNOSIS — E785 Hyperlipidemia, unspecified: Secondary | ICD-10-CM | POA: Diagnosis not present

## 2020-01-24 DIAGNOSIS — I10 Essential (primary) hypertension: Secondary | ICD-10-CM | POA: Diagnosis not present

## 2020-01-24 DIAGNOSIS — R001 Bradycardia, unspecified: Secondary | ICD-10-CM | POA: Diagnosis not present

## 2020-01-26 ENCOUNTER — Other Ambulatory Visit: Payer: Self-pay | Admitting: Internal Medicine

## 2020-01-26 DIAGNOSIS — E1169 Type 2 diabetes mellitus with other specified complication: Secondary | ICD-10-CM

## 2020-01-26 DIAGNOSIS — E785 Hyperlipidemia, unspecified: Secondary | ICD-10-CM

## 2020-01-27 DIAGNOSIS — Z20822 Contact with and (suspected) exposure to covid-19: Secondary | ICD-10-CM | POA: Diagnosis not present

## 2020-01-27 DIAGNOSIS — H25813 Combined forms of age-related cataract, bilateral: Secondary | ICD-10-CM | POA: Diagnosis not present

## 2020-01-30 DIAGNOSIS — Z85048 Personal history of other malignant neoplasm of rectum, rectosigmoid junction, and anus: Secondary | ICD-10-CM | POA: Diagnosis not present

## 2020-01-30 DIAGNOSIS — N189 Chronic kidney disease, unspecified: Secondary | ICD-10-CM | POA: Diagnosis not present

## 2020-01-30 DIAGNOSIS — Z79899 Other long term (current) drug therapy: Secondary | ICD-10-CM | POA: Diagnosis not present

## 2020-01-30 DIAGNOSIS — Z7982 Long term (current) use of aspirin: Secondary | ICD-10-CM | POA: Diagnosis not present

## 2020-01-30 DIAGNOSIS — Z794 Long term (current) use of insulin: Secondary | ICD-10-CM | POA: Diagnosis not present

## 2020-01-30 DIAGNOSIS — Z791 Long term (current) use of non-steroidal anti-inflammatories (NSAID): Secondary | ICD-10-CM | POA: Diagnosis not present

## 2020-01-30 DIAGNOSIS — E785 Hyperlipidemia, unspecified: Secondary | ICD-10-CM | POA: Diagnosis not present

## 2020-01-30 DIAGNOSIS — H25812 Combined forms of age-related cataract, left eye: Secondary | ICD-10-CM | POA: Diagnosis not present

## 2020-01-30 DIAGNOSIS — I129 Hypertensive chronic kidney disease with stage 1 through stage 4 chronic kidney disease, or unspecified chronic kidney disease: Secondary | ICD-10-CM | POA: Diagnosis not present

## 2020-01-30 DIAGNOSIS — N179 Acute kidney failure, unspecified: Secondary | ICD-10-CM | POA: Diagnosis not present

## 2020-01-30 DIAGNOSIS — H538 Other visual disturbances: Secondary | ICD-10-CM | POA: Diagnosis not present

## 2020-01-30 DIAGNOSIS — E1136 Type 2 diabetes mellitus with diabetic cataract: Secondary | ICD-10-CM | POA: Diagnosis not present

## 2020-01-30 DIAGNOSIS — F419 Anxiety disorder, unspecified: Secondary | ICD-10-CM | POA: Diagnosis not present

## 2020-01-30 DIAGNOSIS — I6523 Occlusion and stenosis of bilateral carotid arteries: Secondary | ICD-10-CM | POA: Diagnosis not present

## 2020-01-30 DIAGNOSIS — E1122 Type 2 diabetes mellitus with diabetic chronic kidney disease: Secondary | ICD-10-CM | POA: Diagnosis not present

## 2020-03-08 LAB — HM DIABETES EYE EXAM

## 2020-03-16 ENCOUNTER — Telehealth: Payer: Self-pay | Admitting: Internal Medicine

## 2020-03-16 NOTE — Telephone Encounter (Signed)
Levemir insulin is not on the patient's formulary list since she has changed insurance. She will drop off a form for Dr. Army Melia to sign for the Levemir. [phone]

## 2020-03-19 NOTE — Telephone Encounter (Signed)
Completed PA form patient brought in - Faxed to [employer]. Awaiting response for PA for Levemir.   CM

## 2020-04-02 DIAGNOSIS — B351 Tinea unguium: Secondary | ICD-10-CM | POA: Diagnosis not present

## 2020-04-02 DIAGNOSIS — E119 Type 2 diabetes mellitus without complications: Secondary | ICD-10-CM | POA: Diagnosis not present

## 2020-04-20 ENCOUNTER — Ambulatory Visit: Payer: Medicare Other | Admitting: Internal Medicine

## 2020-04-23 ENCOUNTER — Encounter: Payer: Self-pay | Admitting: Internal Medicine

## 2020-04-23 ENCOUNTER — Other Ambulatory Visit: Payer: Self-pay

## 2020-04-23 ENCOUNTER — Ambulatory Visit (INDEPENDENT_AMBULATORY_CARE_PROVIDER_SITE_OTHER): Payer: Medicare Other | Admitting: Internal Medicine

## 2020-04-23 VITALS — BP 122/54 | HR 75 | Temp 98.2°F | Ht 62.0 in | Wt 155.0 lb

## 2020-04-23 DIAGNOSIS — E1169 Type 2 diabetes mellitus with other specified complication: Secondary | ICD-10-CM | POA: Diagnosis not present

## 2020-04-23 DIAGNOSIS — E118 Type 2 diabetes mellitus with unspecified complications: Secondary | ICD-10-CM

## 2020-04-23 DIAGNOSIS — I6523 Occlusion and stenosis of bilateral carotid arteries: Secondary | ICD-10-CM | POA: Diagnosis not present

## 2020-04-23 DIAGNOSIS — I10 Essential (primary) hypertension: Secondary | ICD-10-CM | POA: Diagnosis not present

## 2020-04-23 DIAGNOSIS — M62838 Other muscle spasm: Secondary | ICD-10-CM

## 2020-04-23 DIAGNOSIS — E785 Hyperlipidemia, unspecified: Secondary | ICD-10-CM

## 2020-04-23 MED ORDER — BACLOFEN 10 MG PO TABS: 10.0000 mg | ORAL_TABLET | Freq: Every day | ORAL | 0 refills | Status: DC

## 2020-04-23 MED ORDER — LEVEMIR FLEXTOUCH 100 UNIT/ML ~~LOC~~ SOPN: 20.0000 [IU] | PEN_INJECTOR | Freq: Every day | SUBCUTANEOUS | 1 refills | Status: DC

## 2020-04-23 NOTE — Progress Notes (Signed)
Date:  04/23/2020   Name:  [name]   DOB:  August 04, 1946   MRN:  [id number]   Chief Complaint: Hypertension (follow up ) and Diabetes (follow up )  Diabetes She presents for her follow-up diabetic visit. She has type 2 diabetes mellitus. Her disease course has been stable. Pertinent negatives for hypoglycemia include no headaches or tremors. Pertinent negatives for diabetes include no chest pain, no fatigue, no polydipsia, no polyuria and no weakness. Symptoms are stable. Current diabetic treatment includes oral agent (monotherapy) and insulin injections (metformin and insulin). She is compliant with treatment most of the time. Her weight is stable. She is following a generally healthy diet. She monitors blood glucose at home 1-2 x per day. Her breakfast blood glucose is taken between 7-8 am. Her breakfast blood glucose range is generally 130-140 mg/dl. Her dinner blood glucose is taken between 6-7 pm. Her dinner blood glucose range is generally 180-200 mg/dl. Eye exam is current.  Hypertension This is a chronic problem. The problem is controlled. Associated symptoms include neck pain. Pertinent negatives include no chest pain, headaches, palpitations or shortness of breath. Past treatments include beta blockers, ACE inhibitors, diuretics and central alpha agonists. The current treatment provides significant improvement.  Hyperlipidemia This is a chronic problem. Pertinent negatives include no chest pain or shortness of breath. Current antihyperlipidemic treatment includes statins. The current treatment provides significant improvement of lipids.  Neck Pain  This is a new problem. The current episode started in the past 7 days. The problem occurs constantly. The problem has been unchanged. The pain is associated with nothing. The quality of the pain is described as cramping and burning. Pertinent negatives include no chest pain, fever, headaches, numbness, trouble swallowing or weakness. She  has tried ice, heat and NSAIDs for the symptoms. The treatment provided moderate relief.    Lab Results  Component Value Date   CREATININE 1.02 (H) 12/21/2019   BUN 12 12/21/2019   NA 139 12/21/2019   K 4.8 12/21/2019   CL 99 12/21/2019   CO2 22 12/21/2019   Lab Results  Component Value Date   CHOL 184 12/21/2019   HDL 96 12/21/2019   LDLCALC 77 12/21/2019   TRIG 60 12/21/2019   CHOLHDL 1.9 12/21/2019   Lab Results  Component Value Date   TSH 1.450 12/21/2019   Lab Results  Component Value Date   HGBA1C 7.9 (H) 12/21/2019   Lab Results  Component Value Date   WBC 8.4 12/21/2019   HGB 12.1 12/21/2019   HCT 36.5 12/21/2019   MCV 91 12/21/2019   PLT 317 12/21/2019   Lab Results  Component Value Date   ALT 12 12/21/2019   AST 15 12/21/2019   ALKPHOS 138 (H) 12/21/2019   BILITOT 0.3 12/21/2019     Review of Systems  Constitutional: Negative for appetite change, fatigue, fever and unexpected weight change.  HENT: Negative for tinnitus and trouble swallowing.   Eyes: Negative for visual disturbance.  Respiratory: Negative for cough, chest tightness and shortness of breath.   Cardiovascular: Negative for chest pain, palpitations and leg swelling.  Gastrointestinal: Negative for abdominal pain.  Endocrine: Negative for polydipsia and polyuria.  Genitourinary: Negative for dysuria and hematuria.  Musculoskeletal: Positive for neck pain and neck stiffness. Negative for arthralgias.  Neurological: Negative for tremors, weakness, numbness and headaches.  Psychiatric/Behavioral: Negative for dysphoric mood.    Patient Active Problem List   Diagnosis Date Noted  . Mood disorder ([place]) 12/21/2019  .  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 Bilateral carotid artery stenosis 08/24/2018  . Atherosclerosis of aorta ([place]) 08/24/2018  . Type II diabetes mellitus with complication ([place]) 25/85/2778  . Bradycardia 07/24/2018  . Neoplasm of uncertain behavior of skin  01/16/2017  . 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 Essential (primary) hypertension 05/08/2015  . History of rectal cancer 05/08/2015  . Hyperlipidemia associated with type 2 diabetes mellitus ([place]) 05/08/2015  . Seizure disorder ([place]) 05/08/2015  . Shoulder strain 05/08/2015  . Avitaminosis D 05/08/2015    Allergies  Allergen Reactions  . Pioglitazone Nausea Only    Past Surgical History:  Procedure Laterality Date  . COLOSTOMY REVERSAL  01/2014  . ILEOSTOMY  05/2013  . UMBILICAL HERNIA REPAIR  05/2018    Social History   Tobacco Use  . Smoking status: Never Smoker  . Smokeless tobacco: Never Used  Substance Use Topics  . Alcohol use: No    Alcohol/week: 0.0 standard drinks  . Drug use: No     Medication list has been reviewed and updated.  Current Meds  Medication Sig  . aspirin 81 MG chewable tablet Chew 81 mg by mouth daily.   Marland Kitchen atorvastatin (LIPITOR) 40 MG tablet TAKE 1 TABLET BY MOUTH DAILY  . calcium carbonate (OSCAL) 1500 (600 Ca) MG TABS tablet Take 600 mg of elemental calcium by mouth daily.  . carbamazepine (TEGRETOL) 200 MG tablet Take 1 tablet (200 mg total) by mouth daily.  . Cholecalciferol (D3 HIGH POTENCY) 1000 UNITS capsule Take 1,000 Units by mouth daily.   . Fluocinolone Acetonide 0.01 % OIL INSTILL 4 DROPS INTO BOTH EARS THREE TIMES DAILY AS NEEDED FOR DRYNESS AND ITCHING  . glucose blood test strip Use to test blood sugar up to twice daily.  . hydrALAZINE (APRESOLINE) 25 MG tablet TAKE 1 TABLET BY MOUTH EVERY 8 HOURS (Patient taking differently: Take 25 mg by mouth 2 (two) times daily. )  . hydrochlorothiazide (MICROZIDE) 12.5 MG capsule TAKE 1 CAPSULE(12.5 MG) BY MOUTH DAILY  . Insulin Detemir (LEVEMIR FLEXTOUCH) 100 UNIT/ML Pen Inject 25 Units into the skin at bedtime. (Patient taking differently: Inject 20 Units into the skin at bedtime. )  . meloxicam (MOBIC) 15 MG tablet Take 15 mg by mouth as needed for pain.  . metFORMIN (GLUCOPHAGE-XR) 500 MG 24  hr tablet TAKE 2 TABLETS BY MOUTH TWICE DAILY  . metoprolol succinate (TOPROL-XL) 25 MG 24 hr tablet Take 0.5 tablets (12.5 mg total) by mouth every 8 (eight) hours.  . nitroGLYCERIN (NITROSTAT) 0.4 MG SL tablet Place 1 tablet (0.4 mg total) under the tongue every 5 (five) minutes x 3 doses as needed for chest pain.  Marland Kitchen Propylene Glycol (SYSTANE BALANCE OP) Apply to eye.  . ramipril (ALTACE) 5 MG capsule TAKE 1 CAPSULE BY MOUTH DAILY    PHQ 2/9 Scores 04/23/2020 12/21/2019 12/05/2019 09/26/2019  PHQ - 2 Score 0 4 1 3   PHQ- 9 Score 1 10 4 12     BP Readings from Last 3 Encounters:  04/23/20 (!) 122/54  12/21/19 128/64  12/05/19 (!) 130/58    Physical Exam Vitals and nursing note reviewed.  Constitutional:      General: She is not in acute distress.    Appearance: She is well-developed.  HENT:     Head: Normocephalic and atraumatic.  Cardiovascular:     Rate and Rhythm: Normal rate and regular rhythm.     Pulses: Normal pulses.  Pulmonary:     Effort: Pulmonary effort  is normal. No respiratory distress.     Breath sounds: No wheezing or rhonchi.  Musculoskeletal:     Cervical back: Spasms and tenderness present. Decreased range of motion.     Right lower leg: No edema.     Left lower leg: No edema.  Lymphadenopathy:     Cervical: No cervical adenopathy.  Skin:    General: Skin is warm and dry.     Capillary Refill: Capillary refill takes less than 2 seconds.     Findings: No rash.  Neurological:     General: No focal deficit present.     Mental Status: She is alert and oriented to person, place, and time.  Psychiatric:        Behavior: Behavior normal.        Thought Content: Thought content normal.     Wt Readings from Last 3 Encounters:  04/23/20 155 lb (70.3 kg)  12/21/19 158 lb (71.7 kg)  12/05/19 156 lb 6.4 oz (70.9 kg)    BP (!) 122/54   Pulse 75   Temp 98.2 F (36.8 C) (Oral)   Ht 5\' 2"  (1.575 m)   Wt 155 lb (70.3 kg)   SpO2 96%   BMI 28.35 kg/m    Assessment and Plan: 1. Essential (primary) hypertension Clinically stable exam with well controlled BP on 4 agents. Tolerating medications without side effects at this time. Pt to continue current regimen and low sodium diet; benefits of regular exercise as able discussed.   2. Type II diabetes mellitus with complication ([place]) Clinically stable by exam and report without s/s of hypoglycemia. DM complicated by HTN and lipids. Tolerating medications insulin and metformin well without side effects or other concerns. - Hemoglobin L2H - Basic metabolic panel - insulin detemir (LEVEMIR FLEXTOUCH) 100 UNIT/ML FlexPen; Inject 20-25 Units into the skin at bedtime.  Dispense: 45 mL; Refill: 1  3. Hyperlipidemia associated with type 2 diabetes mellitus ([place]) Tolerating statin medication without side effects at this time LDL is at goal of < 70 on current dose Continue same therapy without change at this time.  4. Bilateral carotid artery stenosis US done 6 months ago was stable Repeat again this fall  5. Muscle spasms of neck Continue Ice/heat, topical rubs and tylenol - baclofen (LIORESAL) 10 MG tablet; Take 1 tablet (10 mg total) by mouth at bedtime.  Dispense: 30 each; Refill: 0   Partially dictated using Editor, commissioning. Any errors are unintentional.  Halina Maidens, MD [facility]  04/23/2020

## 2020-04-24 ENCOUNTER — Telehealth: Payer: Self-pay

## 2020-04-24 LAB — BASIC METABOLIC PANEL
BUN/Creatinine Ratio: 14 (ref 12–28)
BUN: 13 mg/dL (ref 8–27)
CO2: 22 mmol/L (ref 20–29)
Calcium: 10.2 mg/dL (ref 8.7–10.3)
Chloride: 95 mmol/L — ABNORMAL LOW (ref 96–106)
Creatinine, Ser: 0.96 mg/dL (ref 0.57–1.00)
GFR calc Af Amer: 67 mL/min/{1.73_m2} (ref >59)
GFR calc non Af Amer: 58 mL/min/{1.73_m2} — ABNORMAL LOW (ref >59)
Glucose: 152 mg/dL — ABNORMAL HIGH (ref 65–99)
Potassium: 4.2 mmol/L (ref 3.5–5.2)
Sodium: 132 mmol/L — ABNORMAL LOW (ref 134–144)

## 2020-04-24 LAB — HEMOGLOBIN A1C
Est. average glucose Bld gHb Est-mCnc: 174 mg/dL
Hgb A1c MFr Bld: 7.7 % — ABNORMAL HIGH (ref 4.8–5.6)

## 2020-04-24 NOTE — Telephone Encounter (Signed)
This an example of when you should know that the patient does not want to pay that much for a prescription.  While you were on the phone with the pharmacy would have been the time to ask what alternatives or preferred insulins are covered.  I have no way to know.  You may need to call the pharmacy back to get this information.

## 2020-04-25 ENCOUNTER — Other Ambulatory Visit: Payer: Self-pay | Admitting: Internal Medicine

## 2020-04-25 DIAGNOSIS — E118 Type 2 diabetes mellitus with unspecified complications: Secondary | ICD-10-CM

## 2020-04-25 MED ORDER — LANTUS SOLOSTAR 100 UNIT/ML ~~LOC~~ SOPN: 20.0000 [IU] | PEN_INJECTOR | Freq: Every day | SUBCUTANEOUS | 3 refills | Status: DC

## 2020-04-25 NOTE — Telephone Encounter (Signed)
Called pharmacy Lantus or Toujeo are the only alternatives. Both are Tier 3.  KP

## 2020-04-25 NOTE — Telephone Encounter (Signed)
Called pt to let her know that Lantus was sent in for her in place of the [place]. She will use the same pen needles and the same dose. Explain to her if it was to high to give [place] a call back. Pt told me she took 2 muscle relaxers and wanted to know if she was ok she took them about 6 hours apart. Told pt that it could be taken up to 3 times a day so she should be fine. Pt also told me that she fell down but she caught herself and hit her head on something that she feels ok she had no headache or any other syptoms and she was still able to walk. Told pt that if she feels like she isn't feeling well to go to UC or the ER. Pt verbalized understanding.  KP

## 2020-04-25 NOTE — Telephone Encounter (Signed)
Please call pt and let her know that I sent in Lantus pens instead of levemir.  She will stay on the same dose and use the same pen needles.  If the cost is too high, let us know.

## 2020-04-27 ENCOUNTER — Ambulatory Visit: Payer: Self-pay

## 2020-04-27 NOTE — Telephone Encounter (Signed)
Called pt with labs she needs to stop taking baclofen does not sound like a seizure and do not think this is interacting with her tegretol.  Once the medication is out of her system she can better assess her symptoms. Pt verbalized understanding.  KP

## 2020-04-27 NOTE — Telephone Encounter (Signed)
It sounds like she needs to throw the baclofen out.  It does not sound like a seizure and I do not think this is interacting with her tegretol.  Once the medication is out of her system, she can better assess her symptoms.

## 2020-04-27 NOTE — Telephone Encounter (Signed)
Took initial triage call. Pt. Reported she had fainted this past Tuesday, 04/24/20. Because of poor connection and sound issue, another triage nurse completed triage.

## 2020-04-27 NOTE — Telephone Encounter (Signed)
Contacted pt to discuss symptoms; the pt states she was given Baclofen on 04/24/20; the pt states she 1 Baclofen (10 mg) at night and another at bedtime; the pt states she was in a fog and could not sleep; on 04/25/20 she felt ok when she got up, her cbg was 143 but shedid "pass out" that morning   the pt says she did not beome unconsciousness, but she had "weakness"; on  she was not able to sleep that night so she took 1/2 Baclofen tablet; she had episodes of weakness on 04/26/20; the pt states she feels like she has been having episodes of "feeling like she is going in and out"; her last dose pf Baclofen was 04/25/20 at 0400; the pt soul like to know if there is something she can take besides baclofen because it may be interacting with her tegretol; the pt states she has a history of seizures of seizures but has not had one since 1996; recommendations made per nurse triage protocol; she verbalized understanding, and would like a virtual appt; however she does not have a smart phone and her computer is not working; spoke with the pt sees Dr Army Melia, [place]; spoke with Estill Bamberg in regards to scheduling; she would like to have this routed to the office for review; pt notified, and can be contacted at [phone]; will route per request.  Reason for Disposition  [1] Age > 50 years  AND [2] now alert and feels fine  Answer Assessment - Initial Assessment Questions 1. ONSET: "How long were you unconscious?" (minutes) "When did it happen?"     "a sec"; pt states she did not lose consciousness 2. CONTENT: "What happened during period of unconsciousness?" (e.g., seizure activity)      Not sure 3. MENTAL STATUS: "Alert and oriented now?" (oriented x 3 = name, month, location)      A&O x 3 4. TRIGGER: "What do you think caused the fainting?" "What were you doing just before you fainted?"  (e.g., exercise, sudden standing up, prolonged standing)     Medication interaction 5. RECURRENT SYMPTOM: "Have you ever  passed out before?" If so, ask: "When was the last time?" and "What happened that time?"     "a couple years ago had weak spell" 6. INJURY: "Did you sustain any injury during the fall?"      Black eye on right; bruise on right shoulder and right side of chin 7. CARDIAC SYMPTOMS: "Have you had any of the following symptoms: chest pain, difficulty breathing, palpitations?"     no 8. NEUROLOGIC SYMPTOMS: "Have you had any of the following symptoms: headache, numbness, vertigo, weakness?"     weakness 9. GI SYMPTOMS: "Have you had any of the following symptoms: abdominal pain, vomiting, diarrhea, blood in stools?"     no 10. OTHER SYMPTOMS: "Do you have any other symptoms?"       weakness 11. PREGNANCY: "Is there any chance you are pregnant?" "When was your last menstrual period?"      no  Protocols used: [facility]

## 2020-04-27 NOTE — Telephone Encounter (Signed)
Please Advise.  KP

## 2020-04-30 ENCOUNTER — Telehealth: Payer: Self-pay | Admitting: Internal Medicine

## 2020-04-30 NOTE — Telephone Encounter (Signed)
Copied from [place] [id number]. Topic: General - Other >> Apr 30, 2020 10:32 AM Hinda Lenis D wrote: Reason for CRM: PT provide her dates for the vaccine 01/10/20 1st 02/10/20 2nd dose / [employer]

## 2020-04-30 NOTE — Telephone Encounter (Signed)
Added these into patients record.  Thank you, CM

## 2020-06-08 DIAGNOSIS — H6063 Unspecified chronic otitis externa, bilateral: Secondary | ICD-10-CM | POA: Diagnosis not present

## 2020-06-08 DIAGNOSIS — H6123 Impacted cerumen, bilateral: Secondary | ICD-10-CM | POA: Diagnosis not present

## 2020-07-27 DIAGNOSIS — D121 Benign neoplasm of appendix: Secondary | ICD-10-CM | POA: Diagnosis not present

## 2020-07-27 DIAGNOSIS — Z1211 Encounter for screening for malignant neoplasm of colon: Secondary | ICD-10-CM | POA: Diagnosis not present

## 2020-07-27 DIAGNOSIS — Z85038 Personal history of other malignant neoplasm of large intestine: Secondary | ICD-10-CM | POA: Diagnosis not present

## 2020-07-27 DIAGNOSIS — K635 Polyp of colon: Secondary | ICD-10-CM | POA: Diagnosis not present

## 2020-07-27 DIAGNOSIS — D122 Benign neoplasm of ascending colon: Secondary | ICD-10-CM | POA: Diagnosis not present

## 2020-07-27 LAB — HM COLONOSCOPY

## 2020-08-06 ENCOUNTER — Other Ambulatory Visit: Payer: Self-pay | Admitting: Internal Medicine

## 2020-08-06 DIAGNOSIS — R001 Bradycardia, unspecified: Secondary | ICD-10-CM | POA: Diagnosis not present

## 2020-08-06 DIAGNOSIS — G40909 Epilepsy, unspecified, not intractable, without status epilepticus: Secondary | ICD-10-CM

## 2020-08-06 DIAGNOSIS — I10 Essential (primary) hypertension: Secondary | ICD-10-CM | POA: Diagnosis not present

## 2020-08-06 DIAGNOSIS — E785 Hyperlipidemia, unspecified: Secondary | ICD-10-CM | POA: Diagnosis not present

## 2020-08-06 NOTE — Telephone Encounter (Signed)
Please Advise it looks like pt has not had medication in 2 months.  KP

## 2020-08-23 ENCOUNTER — Other Ambulatory Visit: Payer: Self-pay

## 2020-08-23 ENCOUNTER — Ambulatory Visit (INDEPENDENT_AMBULATORY_CARE_PROVIDER_SITE_OTHER): Payer: Medicare Other | Admitting: Internal Medicine

## 2020-08-23 ENCOUNTER — Encounter: Payer: Self-pay | Admitting: Internal Medicine

## 2020-08-23 VITALS — BP 136/78 | HR 69 | Ht 62.0 in | Wt 159.0 lb

## 2020-08-23 DIAGNOSIS — I10 Essential (primary) hypertension: Secondary | ICD-10-CM | POA: Diagnosis not present

## 2020-08-23 DIAGNOSIS — Z23 Encounter for immunization: Secondary | ICD-10-CM | POA: Diagnosis not present

## 2020-08-23 DIAGNOSIS — Z85048 Personal history of other malignant neoplasm of rectum, rectosigmoid junction, and anus: Secondary | ICD-10-CM

## 2020-08-23 DIAGNOSIS — E118 Type 2 diabetes mellitus with unspecified complications: Secondary | ICD-10-CM

## 2020-08-23 MED ORDER — METOPROLOL SUCCINATE ER 25 MG PO TB24: 12.5000 mg | ORAL_TABLET | Freq: Two times a day (BID) | ORAL | 3 refills | Status: DC

## 2020-08-23 NOTE — Progress Notes (Signed)
Date:  08/23/2020   Name:  [name]   DOB:  1946-02-23   MRN:  [id number]   Chief Complaint: Hypertension (Follow up) and Diabetes (Follow up)  Hypertension This is a chronic problem. The problem is controlled. Associated symptoms include sweats. Pertinent negatives include no chest pain, headaches, palpitations, peripheral edema or shortness of breath. Past treatments include ACE inhibitors, beta blockers, diuretics and alpha 1 blockers. The current treatment provides significant improvement.  Diabetes She presents for her follow-up diabetic visit. She has type 2 diabetes mellitus. Her disease course has been stable. Hypoglycemia symptoms include sweats. Pertinent negatives for hypoglycemia include no dizziness, headaches, nervousness/anxiousness or tremors. Pertinent negatives for diabetes include no chest pain, no fatigue, no polydipsia and no polyuria. There are no hypoglycemic complications. She monitors blood glucose at home 1-2 x per day. Her breakfast blood glucose is taken between 6-7 am. Her breakfast blood glucose range is generally 110-130 mg/dl. An ACE inhibitor/angiotensin II receptor blocker is being taken. Eye exam is current.   Immunization History  Administered Date(s) Administered  . Fluad Quad(high Dose 65+) 08/29/2019  . Influenza, High Dose Seasonal PF 09/21/2017, 09/02/2018  . Influenza,inj,Quad PF,6+ Mos 08/20/2015, 09/17/2016  . PFIZER SARS-COV-2 Vaccination 01/10/2020, 02/10/2020  . Pneumococcal Conjugate-13 10/22/2015  . Pneumococcal Polysaccharide-23 07/17/2017  . Tdap 12/19/2014    Lab Results  Component Value Date   CREATININE 0.96 04/23/2020   BUN 13 04/23/2020   NA 132 (L) 04/23/2020   K 4.2 04/23/2020   CL 95 (L) 04/23/2020   CO2 22 04/23/2020   Lab Results  Component Value Date   CHOL 184 12/21/2019   HDL 96 12/21/2019   LDLCALC 77 12/21/2019   TRIG 60 12/21/2019   CHOLHDL 1.9 12/21/2019   Lab Results  Component Value Date   TSH  1.450 12/21/2019   Lab Results  Component Value Date   HGBA1C 7.7 (H) 04/23/2020   Lab Results  Component Value Date   WBC 8.4 12/21/2019   HGB 12.1 12/21/2019   HCT 36.5 12/21/2019   MCV 91 12/21/2019   PLT 317 12/21/2019   Lab Results  Component Value Date   ALT 12 12/21/2019   AST 15 12/21/2019   ALKPHOS 138 (H) 12/21/2019   BILITOT 0.3 12/21/2019     Review of Systems  Constitutional: Negative for appetite change, fatigue, fever and unexpected weight change.  HENT: Negative for tinnitus and trouble swallowing.   Eyes: Negative for visual disturbance.  Respiratory: Negative for cough, chest tightness and shortness of breath.   Cardiovascular: Negative for chest pain, palpitations and leg swelling.  Gastrointestinal: Negative for abdominal pain.  Endocrine: Negative for polydipsia and polyuria.  Genitourinary: Negative for dysuria and hematuria.  Musculoskeletal: Positive for neck stiffness. Negative for arthralgias.  Skin: Negative for color change and rash.  Neurological: Negative for dizziness, tremors, numbness and headaches.  Hematological: Negative for adenopathy.  Psychiatric/Behavioral: Negative for dysphoric mood and sleep disturbance. The patient is not nervous/anxious.     Patient Active Problem List   Diagnosis Date Noted  . Mood disorder ([place]) 12/21/2019  . 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 Bilateral carotid artery stenosis 08/24/2018  . Atherosclerosis of aorta ([place]) 08/24/2018  . Type II diabetes mellitus with complication ([place]) 28/78/6767  . Bradycardia 07/24/2018  . Neoplasm of uncertain behavior of skin 01/16/2017  . 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 Essential (primary) hypertension 05/08/2015  . History of rectal cancer 05/08/2015  . Hyperlipidemia associated  with type 2 diabetes mellitus ([place]) 05/08/2015  . Seizure disorder ([place]) 05/08/2015  . Shoulder strain 05/08/2015  . Avitaminosis D 05/08/2015    Allergies  Allergen  Reactions  . Pioglitazone Nausea Only    Past Surgical History:  Procedure Laterality Date  . COLOSTOMY REVERSAL  01/2014  . ILEOSTOMY  05/2013  . UMBILICAL HERNIA REPAIR  05/2018    Social History   Tobacco Use  . Smoking status: Never Smoker  . Smokeless tobacco: Never Used  Vaping Use  . Vaping Use: Never used  Substance Use Topics  . Alcohol use: No    Alcohol/week: 0.0 standard drinks  . Drug use: No     Medication list has been reviewed and updated.  Current Meds  Medication Sig  . aspirin 81 MG chewable tablet Chew 81 mg by mouth daily.   Marland Kitchen atorvastatin (LIPITOR) 40 MG tablet TAKE 1 TABLET BY MOUTH DAILY  . calcium carbonate (OSCAL) 1500 (600 Ca) MG TABS tablet Take 600 mg of elemental calcium by mouth daily.  . carbamazepine (TEGRETOL) 200 MG tablet TAKE 1 TABLET(200 MG) BY MOUTH DAILY  . Cholecalciferol (D3 HIGH POTENCY) 1000 UNITS capsule Take 1,000 Units by mouth daily.   . Fluocinolone Acetonide 0.01 % OIL INSTILL 4 DROPS INTO BOTH EARS THREE TIMES DAILY AS NEEDED FOR DRYNESS AND ITCHING  . glucose blood test strip Use to test blood sugar up to twice daily.  . hydrALAZINE (APRESOLINE) 25 MG tablet TAKE 1 TABLET BY MOUTH EVERY 8 HOURS (Patient taking differently: Take 25 mg by mouth 2 (two) times daily. )  . hydrochlorothiazide (MICROZIDE) 12.5 MG capsule TAKE 1 CAPSULE(12.5 MG) BY MOUTH DAILY  . insulin glargine (LANTUS SOLOSTAR) 100 UNIT/ML Solostar Pen Inject 20-25 Units into the skin daily. (Patient taking differently: Inject 18 Units into the skin daily. )  . meloxicam (MOBIC) 15 MG tablet Take 15 mg by mouth as needed for pain.  . metFORMIN (GLUCOPHAGE-XR) 500 MG 24 hr tablet TAKE 2 TABLETS BY MOUTH TWICE DAILY  . metoprolol succinate (TOPROL-XL) 25 MG 24 hr tablet Take 0.5 tablets (12.5 mg total) by mouth every 8 (eight) hours. (Patient taking differently: Take 12.5 mg by mouth in the morning and at bedtime. )  . nitroGLYCERIN (NITROSTAT) 0.4 MG SL tablet  Place 1 tablet (0.4 mg total) under the tongue every 5 (five) minutes x 3 doses as needed for chest pain.  Marland Kitchen Propylene Glycol (SYSTANE BALANCE OP) Apply to eye.  . ramipril (ALTACE) 5 MG capsule TAKE 1 CAPSULE BY MOUTH DAILY    PHQ 2/9 Scores 08/23/2020 04/23/2020 12/21/2019 12/05/2019  PHQ - 2 Score 4 0 4 1  PHQ- 9 Score 12 1 10 4     GAD 7 : Generalized Anxiety Score 08/23/2020 04/23/2020 12/21/2019  Nervous, Anxious, on Edge 2 0 2  Control/stop worrying 2 0 3  Worry too much - different things 2 0 3  Trouble relaxing 1 0 2  Restless 1 0 2  Easily annoyed or irritable 0 0 0  Afraid - awful might happen 0 0 1  Total GAD 7 Score 8 0 13  Anxiety Difficulty Not difficult at all Not difficult at all Somewhat difficult    BP Readings from Last 3 Encounters:  08/23/20 136/78  04/23/20 (!) 122/54  12/21/19 128/64    Physical Exam Vitals and nursing note reviewed.  Constitutional:      General: She is not in acute distress.    Appearance: Normal appearance. She  is well-developed.  HENT:     Head: Normocephalic and atraumatic.  Cardiovascular:     Rate and Rhythm: Normal rate and regular rhythm.     Pulses: Normal pulses.  Pulmonary:     Effort: Pulmonary effort is normal. No respiratory distress.     Breath sounds: No wheezing or rhonchi.  Musculoskeletal:     Cervical back: Decreased range of motion.     Right lower leg: No edema.     Left lower leg: No edema.  Lymphadenopathy:     Cervical: No cervical adenopathy.  Skin:    General: Skin is warm and dry.     Capillary Refill: Capillary refill takes less than 2 seconds.     Findings: No rash.  Neurological:     General: No focal deficit present.     Mental Status: She is alert and oriented to person, place, and time.     Gait: Gait normal.  Psychiatric:        Mood and Affect: Mood normal.        Behavior: Behavior normal.     Wt Readings from Last 3 Encounters:  08/23/20 159 lb (72.1 kg)  04/23/20 155 lb (70.3 kg)    12/21/19 158 lb (71.7 kg)    BP 136/78   Pulse 69   Ht 5\' 2"  (1.575 m)   Wt 159 lb (72.1 kg)   SpO2 98%   BMI 29.08 kg/m   Assessment and Plan: 1. Type II diabetes mellitus with complication (HCC) Clinically stable by exam and report without s/s of hypoglycemia. DM complicated by htn. Tolerating medications - metformin and insulin -  well without side effects or other concerns. - Hemoglobin [id number] - Basic metabolic panel  2. Essential (primary) hypertension Clinically stable exam with well controlled BP. Tolerating medications without side effects at this time. Pt to continue current regimen and low sodium diet; benefits of regular exercise as able discussed. - metoprolol succinate (TOPROL-XL) 25 MG 24 hr tablet; Take 0.5 tablets (12.5 mg total) by mouth in the morning and at bedtime.  Dispense: 90 tablet; Refill: 3  3. History of rectal cancer Being followed closely by surgical oncology Colonoscopy last month - large 15 mm benign polyp Repeat in December  4. Need for immunization against influenza - Flu Vaccine QUAD High Dose(Fluad)   Partially dictated using Editor, commissioning. Any errors are unintentional.  Halina Maidens, MD [facility]  08/23/2020

## 2020-08-24 LAB — HEMOGLOBIN A1C
Est. average glucose Bld gHb Est-mCnc: 163 mg/dL
Hgb A1c MFr Bld: 7.3 % — ABNORMAL HIGH (ref 4.8–5.6)

## 2020-08-24 LAB — BASIC METABOLIC PANEL
BUN/Creatinine Ratio: 12 (ref 12–28)
BUN: 12 mg/dL (ref 8–27)
CO2: 23 mmol/L (ref 20–29)
Calcium: 9.6 mg/dL (ref 8.7–10.3)
Chloride: 98 mmol/L (ref 96–106)
Creatinine, Ser: 1.03 mg/dL — ABNORMAL HIGH (ref 0.57–1.00)
GFR calc Af Amer: 62 mL/min/{1.73_m2} (ref >59)
GFR calc non Af Amer: 54 mL/min/{1.73_m2} — ABNORMAL LOW (ref >59)
Glucose: 120 mg/dL — ABNORMAL HIGH (ref 65–99)
Potassium: 4.7 mmol/L (ref 3.5–5.2)
Sodium: 137 mmol/L (ref 134–144)

## 2020-09-27 DIAGNOSIS — B351 Tinea unguium: Secondary | ICD-10-CM | POA: Diagnosis not present

## 2020-09-27 DIAGNOSIS — E119 Type 2 diabetes mellitus without complications: Secondary | ICD-10-CM | POA: Diagnosis not present

## 2020-10-02 ENCOUNTER — Other Ambulatory Visit: Payer: Self-pay | Admitting: Internal Medicine

## 2020-10-02 NOTE — Telephone Encounter (Signed)
Requested medication (s) are due for refill today: Yes  Requested medication (s) are on the active medication list: Yes  Last refill:  22/2/20  Future visit scheduled: Yes  Notes to clinic:  Prescription has expired.    Requested Prescriptions  Pending Prescriptions Disp Refills   hydrALAZINE (APRESOLINE) 25 MG tablet [Pharmacy Med Name: HYDRALAZINE  25MG  TABLETS(ORANGE)] 270 tablet 3    Sig: TAKE 1 TABLET BY MOUTH EVERY 8 HOURS      Cardiovascular:  Vasodilators Passed - 10/02/2020  9:16 AM      Passed - HCT in normal range and within 360 days    Hematocrit  Date Value Ref Range Status  12/21/2019 36.5 34.0 - 46.6 % Final          Passed - HGB in normal range and within 360 days    Hemoglobin  Date Value Ref Range Status  12/21/2019 12.1 11.1 - 15.9 g/dL Final          Passed - RBC in normal range and within 360 days    RBC  Date Value Ref Range Status  12/21/2019 4.03 3.77 - 5.28 x10E6/uL Final  07/25/2018 3.75 (L) 3.80 - 5.20 MIL/uL Final          Passed - WBC in normal range and within 360 days    WBC  Date Value Ref Range Status  12/21/2019 8.4 3.4 - 10.8 x10E3/uL Final  07/25/2018 7.1 3.6 - 11.0 K/uL Final          Passed - PLT in normal range and within 360 days    Platelets  Date Value Ref Range Status  12/21/2019 317 150 - 450 x10E3/uL Final          Passed - Last BP in normal range    BP Readings from Last 1 Encounters:  08/23/20 136/78          Passed - Valid encounter within last 12 months    Recent Outpatient Visits           1 month ago Type II diabetes mellitus with complication [facility])   [facility] Glean Hess, MD   5 months ago Essential (primary) hypertension   [facility] Glean Hess, MD   9 months ago Annual physical exam   [facility] Glean Hess, MD   1 year ago [place]   [facility] Glean Hess, MD   1 year ago Type 2 diabetes mellitus with stage 3  chronic kidney disease, with long-term current use of insulin [facility])   [facility] Glean Hess, MD       Future Appointments             In 2 months [employer] Mel[facility] Jesse Sans, MD [facility], [facility]

## 2020-10-26 DIAGNOSIS — D122 Benign neoplasm of ascending colon: Secondary | ICD-10-CM | POA: Diagnosis not present

## 2020-10-26 DIAGNOSIS — Z1211 Encounter for screening for malignant neoplasm of colon: Secondary | ICD-10-CM | POA: Diagnosis not present

## 2020-10-26 DIAGNOSIS — Z86010 Personal history of colonic polyps: Secondary | ICD-10-CM | POA: Diagnosis not present

## 2020-11-03 ENCOUNTER — Other Ambulatory Visit: Payer: Self-pay | Admitting: Internal Medicine

## 2020-11-03 DIAGNOSIS — I10 Essential (primary) hypertension: Secondary | ICD-10-CM

## 2020-11-03 NOTE — Telephone Encounter (Signed)
Requested Prescriptions  Pending Prescriptions Disp Refills   hydrochlorothiazide (MICROZIDE) 12.5 MG capsule [Pharmacy Med Name: HYDROCHLOROTHIAZIDE 12.5MG  CAPSULES] 90 capsule 1    Sig: TAKE 1 CAPSULE(12.5 MG) BY MOUTH DAILY     Cardiovascular: Diuretics - Thiazide Failed - 11/03/2020  2:26 PM      Failed - Cr in normal range and within 360 days    Creatinine  Date Value Ref Range Status  05/04/2013 1.01 0.60 - 1.30 mg/dL Final   Creatinine, Ser  Date Value Ref Range Status  08/23/2020 1.03 (H) 0.57 - 1.00 mg/dL Final         Passed - Ca in normal range and within 360 days    Calcium  Date Value Ref Range Status  08/23/2020 9.6 8.7 - 10.3 mg/dL Final   Calcium, Total  Date Value Ref Range Status  04/13/2013 8.9 8.5 - 10.1 mg/dL Final         Passed - K in normal range and within 360 days    Potassium  Date Value Ref Range Status  08/23/2020 4.7 3.5 - 5.2 mmol/L Final  05/04/2013 4.0 3.5 - 5.1 mmol/L Final         Passed - Na in normal range and within 360 days    Sodium  Date Value Ref Range Status  08/23/2020 137 134 - 144 mmol/L Final  04/13/2013 136 136 - 145 mmol/L Final         Passed - Last BP in normal range    BP Readings from Last 1 Encounters:  08/23/20 136/78         Passed - Valid encounter within last 6 months    Recent Outpatient Visits          2 months ago Type II diabetes mellitus with complication [facility])   [facility] Glean Hess, MD   6 months ago Essential (primary) hypertension   [facility] Glean Hess, MD   10 months ago Annual physical exam   [facility] Glean Hess, MD   1 year ago [place], [place]   [facility] Glean Hess, MD   1 year ago Type 2 diabetes mellitus with stage 3 chronic kidney disease, with long-term current use of insulin [facility])   [facility] Glean Hess, MD      Future Appointments            In 1 month [employer] Melia Jesse Sans, MD [facility]

## 2020-11-06 DIAGNOSIS — H04123 Dry eye syndrome of bilateral lacrimal glands: Secondary | ICD-10-CM | POA: Diagnosis not present

## 2020-11-12 ENCOUNTER — Other Ambulatory Visit: Payer: Self-pay | Admitting: Internal Medicine

## 2020-11-20 DIAGNOSIS — H6062 Unspecified chronic otitis externa, left ear: Secondary | ICD-10-CM | POA: Diagnosis not present

## 2020-11-20 DIAGNOSIS — H6123 Impacted cerumen, bilateral: Secondary | ICD-10-CM | POA: Diagnosis not present

## 2020-12-05 ENCOUNTER — Ambulatory Visit: Payer: Medicare Other

## 2020-12-15 ENCOUNTER — Other Ambulatory Visit: Payer: Self-pay | Admitting: Internal Medicine

## 2020-12-15 NOTE — Telephone Encounter (Signed)
Prescription request not on active med list.

## 2020-12-17 ENCOUNTER — Ambulatory Visit: Payer: Medicare Other

## 2020-12-18 DIAGNOSIS — R928 Other abnormal and inconclusive findings on diagnostic imaging of breast: Secondary | ICD-10-CM | POA: Diagnosis not present

## 2020-12-18 DIAGNOSIS — R921 Mammographic calcification found on diagnostic imaging of breast: Secondary | ICD-10-CM | POA: Diagnosis not present

## 2020-12-24 ENCOUNTER — Other Ambulatory Visit: Payer: Self-pay

## 2020-12-24 ENCOUNTER — Ambulatory Visit (INDEPENDENT_AMBULATORY_CARE_PROVIDER_SITE_OTHER): Payer: Medicare Other

## 2020-12-24 VITALS — BP 140/70 | HR 65 | Temp 98.3°F | Resp 16 | Ht 62.0 in | Wt 163.0 lb

## 2020-12-24 DIAGNOSIS — Z0000 Encounter for general adult medical examination without abnormal findings: Secondary | ICD-10-CM

## 2020-12-24 NOTE — Patient Instructions (Signed)
[name] , Thank you for taking time to come for your Medicare Wellness Visit. I appreciate your ongoing commitment to your health goals. Please review the following plan we discussed and let me know if I can assist you in the future.   Screening recommendations/referrals: Colonoscopy: done 10/26/20 Mammogram: done 12/18/20 Bone Density: done 04/25/14 Recommended yearly ophthalmology/optometry visit for glaucoma screening and checkup Recommended yearly dental visit for hygiene and checkup  Vaccinations: Influenza vaccine: done 08/23/20 Pneumococcal vaccine: done 07/17/17 Tdap vaccine: done 12/19/14 Shingles vaccine: Shingrix discussed. Please contact your pharmacy for coverage information.  Covid-19:done 01/10/20 & 02/10/20  Advanced directives: Advance directive discussed with you today. Even though you declined this today please call our office should you change your mind and we can give you the proper paperwork for you to fill out.  Conditions/risks identified: Recommend increasing physical activity to at least 3 days per week.   Next appointment: Follow up in one year for your annual wellness visit    Preventive Care 65 Years and Older, Female Preventive care refers to lifestyle choices and visits with your health care provider that can promote health and wellness. What does preventive care include?  A yearly physical exam. This is also called an annual well check.  Dental exams once or twice a year.  Routine eye exams. Ask your health care provider how often you should have your eyes checked.  Personal lifestyle choices, including:  Daily care of your teeth and gums.  Regular physical activity.  Eating a healthy diet.  Avoiding tobacco and drug use.  Limiting alcohol use.  Practicing safe sex.  Taking low-dose aspirin every day.  Taking vitamin and mineral supplements as recommended by your health care provider. What happens during an annual well check? The services  and screenings done by your health care provider during your annual well check will depend on your age, overall health, lifestyle risk factors, and family history of disease. Counseling  Your health care provider may ask you questions about your:  Alcohol use.  Tobacco use.  Drug use.  Emotional well-being.  Home and relationship well-being.  Sexual activity.  Eating habits.  History of falls.  Memory and ability to understand (cognition).  Work and work Statistician.  Reproductive health. Screening  You may have the following tests or measurements:  Height, weight, and BMI.  Blood pressure.  Lipid and cholesterol levels. These may be checked every 5 years, or more frequently if you are over 61 years old.  Skin check.  Lung cancer screening. You may have this screening every year starting at age 5 if you have a 30-pack-year history of smoking and currently smoke or have quit within the past 15 years.  Fecal occult blood test (FOBT) of the stool. You may have this test every year starting at age 4.  Flexible sigmoidoscopy or colonoscopy. You may have a sigmoidoscopy every 5 years or a colonoscopy every 10 years starting at age 23.  Hepatitis C blood test.  Hepatitis B blood test.  Sexually transmitted disease (STD) testing.  Diabetes screening. This is done by checking your blood sugar (glucose) after you have not eaten for a while (fasting). You may have this done every 1-3 years.  Bone density scan. This is done to screen for osteoporosis. You may have this done starting at age 106.  Mammogram. This may be done every 1-2 years. Talk to your health care provider about how often you should have regular mammograms. Talk with your health  care provider about your test results, treatment options, and if necessary, the need for more tests. Vaccines  Your health care provider may recommend certain vaccines, such as:  Influenza vaccine. This is recommended every  year.  Tetanus, diphtheria, and acellular pertussis (Tdap, Td) vaccine. You may need a Td booster every 10 years.  Zoster vaccine. You may need this after age 44.  Pneumococcal 13-valent conjugate (PCV13) vaccine. One dose is recommended after age 4.  Pneumococcal polysaccharide (PPSV23) vaccine. One dose is recommended after age 52. Talk to your health care provider about which screenings and vaccines you need and how often you need them. This information is not intended to replace advice given to you by your health care provider. Make sure you discuss any questions you have with your health care provider. Document Released: 11/30/2015 Document Revised: 07/23/2016 Document Reviewed: 09/04/2015 Elsevier Interactive Patient Education  2017 [place] Prevention in the Home Falls can cause injuries. They can happen to people of all ages. There are many things you can do to make your home safe and to help prevent falls. What can I do on the outside of my home?  Regularly fix the edges of walkways and driveways and fix any cracks.  Remove anything that might make you trip as you walk through a door, such as a raised step or threshold.  Trim any bushes or trees on the path to your home.  Use bright outdoor lighting.  Clear any walking paths of anything that might make someone trip, such as rocks or tools.  Regularly check to see if handrails are loose or broken. Make sure that both sides of any steps have handrails.  Any raised decks and porches should have guardrails on the edges.  Have any leaves, snow, or ice cleared regularly.  Use sand or salt on walking paths during winter.  Clean up any spills in your garage right away. This includes oil or grease spills. What can I do in the bathroom?  Use night lights.  Install grab bars by the toilet and in the tub and shower. Do not use towel bars as grab bars.  Use non-skid mats or decals in the tub or shower.  If you  need to sit down in the shower, use a plastic, non-slip stool.  Keep the floor dry. Clean up any water that spills on the floor as soon as it happens.  Remove soap buildup in the tub or shower regularly.  Attach bath mats securely with double-sided non-slip rug tape.  Do not have throw rugs and other things on the floor that can make you trip. What can I do in the bedroom?  Use night lights.  Make sure that you have a light by your bed that is easy to reach.  Do not use any sheets or blankets that are too big for your bed. They should not hang down onto the floor.  Have a firm chair that has side arms. You can use this for support while you get dressed.  Do not have throw rugs and other things on the floor that can make you trip. What can I do in the kitchen?  Clean up any spills right away.  Avoid walking on wet floors.  Keep items that you use a lot in easy-to-reach places.  If you need to reach something above you, use a strong step stool that has a grab bar.  Keep electrical cords out of the way.  Do not use floor  polish or wax that makes floors slippery. If you must use wax, use non-skid floor wax.  Do not have throw rugs and other things on the floor that can make you trip. What can I do with my stairs?  Do not leave any items on the stairs.  Make sure that there are handrails on both sides of the stairs and use them. Fix handrails that are broken or loose. Make sure that handrails are as long as the stairways.  Check any carpeting to make sure that it is firmly attached to the stairs. Fix any carpet that is loose or worn.  Avoid having throw rugs at the top or bottom of the stairs. If you do have throw rugs, attach them to the floor with carpet tape.  Make sure that you have a light switch at the top of the stairs and the bottom of the stairs. If you do not have them, ask someone to add them for you. What else can I do to help prevent falls?  Wear shoes  that:  Do not have high heels.  Have rubber bottoms.  Are comfortable and fit you well.  Are closed at the toe. Do not wear sandals.  If you use a stepladder:  Make sure that it is fully opened. Do not climb a closed stepladder.  Make sure that both sides of the stepladder are locked into place.  Ask someone to hold it for you, if possible.  Clearly mark and make sure that you can see:  Any grab bars or handrails.  First and last steps.  Where the edge of each step is.  Use tools that help you move around (mobility aids) if they are needed. These include:  Canes.  Walkers.  Scooters.  Crutches.  Turn on the lights when you go into a dark area. Replace any light bulbs as soon as they burn out.  Set up your furniture so you have a clear path. Avoid moving your furniture around.  If any of your floors are uneven, fix them.  If there are any pets around you, be aware of where they are.  Review your medicines with your doctor. Some medicines can make you feel dizzy. This can increase your chance of falling. Ask your doctor what other things that you can do to help prevent falls. This information is not intended to replace advice given to you by your health care provider. Make sure you discuss any questions you have with your health care provider. Document Released: 08/30/2009 Document Revised: 04/10/2016 Document Reviewed: 12/08/2014 Elsevier Interactive Patient Education  2017 [employer].

## 2020-12-24 NOTE — Progress Notes (Signed)
Subjective:   [name] is a 75 y.o. female who presents for Medicare Annual (Subsequent) preventive examination.  Review of Systems     Cardiac Risk Factors include: advanced age (>71men, >38 women);diabetes mellitus;dyslipidemia;hypertension     Objective:    Today's Vitals   12/24/20 1535  BP: 140/70  Pulse: 65  Resp: 16  Temp: 98.3 F (36.8 C)  TempSrc: Oral  SpO2: 98%  Weight: 163 lb (73.9 kg)  Height: 5\' 2"  (1.575 m)   Body mass index is 29.81 kg/m.  Advanced Directives 12/24/2020 12/05/2019 12/01/2018 07/24/2018 07/24/2018 04/20/2018 04/19/2018  Does Patient Have a Medical Advance Directive? No No Yes No No No No  Does patient want to make changes to medical advance directive? - - (No Data) - - - -  Would patient like information on creating a medical advance directive? No - Patient declined No - Patient declined - No - Patient declined - No - Patient declined -    Current Medications (verified) Outpatient Encounter Medications as of 12/24/2020  Medication Sig  . aspirin 81 MG chewable tablet Chew 81 mg by mouth daily.   02/21/2021 atorvastatin (LIPITOR) 40 MG tablet TAKE 1 TABLET BY MOUTH DAILY  . calcium carbonate (OSCAL) 1500 (600 Ca) MG TABS tablet Take 600 mg of elemental calcium by mouth daily.  . carbamazepine (TEGRETOL) 200 MG tablet TAKE 1 TABLET(200 MG) BY MOUTH DAILY  . Cholecalciferol 25 MCG (1000 UT) capsule Take 1,000 Units by mouth daily.   . Fluocinolone Acetonide 0.01 % OIL INSTILL 4 DROPS INTO BOTH EARS THREE TIMES DAILY AS NEEDED FOR DRYNESS AND ITCHING  . hydrALAZINE (APRESOLINE) 25 MG tablet Take 1 tablet (25 mg total) by mouth 2 (two) times daily.  . hydrochlorothiazide (MICROZIDE) 12.5 MG capsule TAKE 1 CAPSULE(12.5 MG) BY MOUTH DAILY  . insulin glargine (LANTUS SOLOSTAR) 100 UNIT/ML Solostar Pen Inject 20-25 Units into the skin daily. (Patient taking differently: Inject 18 Units into the skin daily.)  . meloxicam (MOBIC) 15 MG tablet Take 15 mg by mouth  as needed for pain.  . metFORMIN (GLUCOPHAGE-XR) 500 MG 24 hr tablet TAKE 2 TABLETS BY MOUTH TWICE DAILY  . metoprolol succinate (TOPROL-XL) 25 MG 24 hr tablet Take 0.5 tablets (12.5 mg total) by mouth in the morning and at bedtime.  Marland Kitchen ULTRA test strip USE 1 STRIP TO CHECK GLUCOSE TWICE DAILY  . Propylene Glycol (SYSTANE BALANCE OP) Apply to eye.  . ramipril (ALTACE) 5 MG capsule TAKE 1 CAPSULE BY MOUTH DAILY  . nitroGLYCERIN (NITROSTAT) 0.4 MG SL tablet Place 1 tablet (0.4 mg total) under the tongue every 5 (five) minutes x 3 doses as needed for chest pain.   No facility-administered encounter medications on file as of 12/24/2020.    Allergies (verified) Baclofen and Pioglitazone   History: Past Medical History:  Diagnosis Date  . Allergy   . Arthritis of knee, degenerative 05/08/2015  . Basal cell carcinoma 03/2019   nose  . Colon cancer (HCC) 2014   Partial colon resection, chemo + rad tx's.   . Diabetes mellitus without complication (HCC)    Pt takes Metformin.  04/2019 Hyperlipidemia   . Hypertension   . SBO (small bowel obstruction) (HCC) 04/20/2018  . Seizures (HCC)   . Umbilical hernia with obstruction but no gangrene   . Vitamin D deficiency    Past Surgical History:  Procedure Laterality Date  . CATARACT EXTRACTION, BILATERAL  01/2020   Dr. 02/2020  .  COLOSTOMY REVERSAL  01/2014  . ILEOSTOMY  05/2013  . UMBILICAL HERNIA REPAIR  05/2018   Family History  Problem Relation Age of Onset  . Cancer Mother 77       unknown type  . Diabetes Daughter   . Diabetes Son    Social History   Socioeconomic History  . Marital status: Widowed    Spouse name: Not on file  . Number of children: 3  . Years of education: Not on file  . Highest education level: Some college, no degree  Occupational History  . Not on file  Tobacco Use  . Smoking status: Never Smoker  . Smokeless tobacco: Never Used  Vaping Use  . Vaping Use: Never used  Substance and Sexual Activity  .  Alcohol use: No    Alcohol/week: 0.0 standard drinks  . Drug use: No  . Sexual activity: Not Currently  Other Topics Concern  . Not on file  Social History Narrative   Pt's daughter lives with her   Social Determinants of Health   Financial Resource Strain: Low Risk   . Difficulty of Paying Living Expenses: Not very hard  Food Insecurity: No Food Insecurity  . Worried About Charity fundraiser in the Last Year: Never true  . Ran Out of Food in the Last Year: Never true  Transportation Needs: No Transportation Needs  . Lack of Transportation (Medical): No  . Lack of Transportation (Non-Medical): No  Physical Activity: Inactive  . Days of Exercise per Week: 0 days  . Minutes of Exercise per Session: 0 min  Stress: Stress Concern Present  . Feeling of Stress : To some extent  Social Connections: Moderately Isolated  . Frequency of Communication with Friends and Family: More than three times a week  . Frequency of Social Gatherings with Friends and Family: More than three times a week  . Attends Religious Services: More than 4 times per year  . Active Member of Clubs or Organizations: No  . Attends Archivist Meetings: Never  . Marital Status: Widowed    Tobacco Counseling Counseling given: Not Answered   Clinical Intake:  Pre-visit preparation completed: Yes  Pain : No/denies pain     BMI - recorded: 29.81 Nutritional Status: BMI 25 -29 Overweight Nutritional Risks: None Diabetes: Yes CBG done?: No Did pt. bring in CBG monitor from home?: No  How often do you need to have someone help you when you read instructions, pamphlets, or other written materials from your doctor or pharmacy?: 1 - Never  Nutrition Risk Assessment:  Has the patient had any N/V/D within the last 2 months?  No  Does the patient have any non-healing wounds?  No  Has the patient had any unintentional weight loss or weight gain?  No   Diabetes:  Is the patient diabetic?  Yes  If  diabetic, was a CBG obtained today?  No  Did the patient bring in their glucometer from home?  No  How often do you monitor your CBG's? 1-2 times per day .   Financial Strains and Diabetes Management:  Are you having any financial strains with the device, your supplies or your medication? No .  Does the patient want to be seen by Chronic Care Management for management of their diabetes?  No  Would the patient like to be referred to a Nutritionist or for Diabetic Management?  No   Diabetic Exams:  Diabetic Eye Exam: Completed 03/08/20 negative retinopathy.   Diabetic  Foot Exam: Completed 12/21/19. Pt has been advised about the importance in completing this exam. Pt is scheduled for diabetic foot exam on 12/25/20.    Interpreter Needed?: No  Information entered by :: Clemetine Marker LPN   Activities of Daily Living In your present state of health, do you have any difficulty performing the following activities: 12/24/2020  Hearing? Y  Comment declines hearing aids  Vision? N  Difficulty concentrating or making decisions? Y  Walking or climbing stairs? N  Dressing or bathing? N  Doing errands, shopping? N  Preparing Food and eating ? N  Using the Toilet? N  In the past six months, have you accidently leaked urine? Y  Comment wears pads for protection  Do you have problems with loss of bowel control? N  Managing your Medications? N  Managing your Finances? N  Housekeeping or managing your Housekeeping? N  Some recent data might be hidden    Patient Care Team: Glean Hess, MD as PCP - General (Internal Medicine) Isaias Cowman, MD as Consulting Physician (Cardiology) Carloyn Manner, MD as Referring Physician (Otolaryngology) Baker Pierini, OD (Optometry) Jannet Mantis, MD (Dermatology) Gwinda Maine, MD as Referring Physician (Surgical Oncology)  Indicate any recent Medical Services you may have received from other than [facility] providers in the past year (date  may be approximate).     Assessment:   This is a routine wellness examination for [name].  Hearing/Vision screen  Hearing Screening   125Hz  250Hz  500Hz  1000Hz  2000Hz  3000Hz  4000Hz  6000Hz  8000Hz   Right ear:           Left ear:           Comments: Pt c/o mild hearing loss and has wax build up; declines hearing aids   Vision Screening Comments: Annual vision screenings with Dr. Mallie Mussel in [place]  Dietary issues and exercise activities discussed: Current Exercise Habits: The patient does not participate in regular exercise at present, Exercise limited by: neurologic condition(s)  Goals    . Exercise 150 minutes per week (moderate activity)     Recommend to increase exercise from 1 day/20 minutes per week to 5 days/30 minutes per week      Depression Screen PHQ 2/9 Scores 12/24/2020 08/23/2020 04/23/2020 12/21/2019 12/05/2019 09/26/2019 06/10/2019  PHQ - 2 Score 2 4 0 4 1 3 3   PHQ- 9 Score 5 12 1 10 4 12 9     Fall Risk Fall Risk  12/24/2020 08/23/2020 04/23/2020 12/05/2019 06/10/2019  Falls in the past year? 1 1 0 0 1  Comment - - - - -  Number falls in past yr: 1 1 0 0 1  Injury with Fall? 0 1 0 0 1  Risk for fall due to : History of fall(s);Impaired balance/gait History of fall(s);Impaired balance/gait No Fall Risks Impaired balance/gait History of fall(s);Impaired balance/gait  Follow up Falls prevention discussed Falls evaluation completed Falls evaluation completed Falls prevention discussed Falls evaluation completed;Falls prevention discussed    FALL RISK PREVENTION PERTAINING TO THE HOME:  Any stairs in or around the home? Yes  If so, are there any without handrails? No  Home free of loose throw rugs in walkways, pet beds, electrical cords, etc? Yes  Adequate lighting in your home to reduce risk of falls? Yes   ASSISTIVE DEVICES UTILIZED TO PREVENT FALLS:  Life alert? No  Use of a cane, walker or w/c? No  Grab bars in the bathroom? Yes  Shower chair or bench in shower? Yes  Elevated toilet seat or a handicapped toilet? Yes   TIMED UP AND GO:  Was the test performed? Yes .  Length of time to ambulate 10 feet: 5 sec.   Gait steady and fast without use of assistive device  Cognitive Function:     6CIT Screen 12/24/2020 12/05/2019 12/01/2018 09/21/2017  What Year? 0 points 0 points 0 points 0 points  What month? 0 points 0 points 0 points 0 points  What time? 0 points 0 points 0 points 0 points  Count back from 20 0 points 0 points 0 points 0 points  Months in reverse 0 points 0 points 0 points 0 points  Repeat phrase 0 points 0 points 0 points 2 points  Total Score 0 0 0 2    Immunizations Immunization History  Administered Date(s) Administered  . Fluad Quad(high Dose 65+) 08/29/2019, 08/23/2020  . Influenza, High Dose Seasonal PF 09/21/2017, 09/02/2018  . Influenza,inj,Quad PF,6+ Mos 08/20/2015, 09/17/2016  . PFIZER(Purple Top)SARS-COV-2 Vaccination 01/10/2020, 02/10/2020  . Pneumococcal Conjugate-13 10/22/2015  . Pneumococcal Polysaccharide-23 07/17/2017  . Tdap 12/19/2014    TDAP status: Up to date  Flu Vaccine status: Up to date  Pneumococcal vaccine status: Up to date  Covid-19 vaccine status: Completed vaccines  Qualifies for Shingles Vaccine? Yes   Zostavax completed No   Shingrix Completed?: No.    Education has been provided regarding the importance of this vaccine. Patient has been advised to call insurance company to determine out of pocket expense if they have not yet received this vaccine. Advised may also receive vaccine at local pharmacy or Health Dept. Verbalized acceptance and understanding.  Screening Tests Health Maintenance  Topic Date Due  . COVID-19 Vaccine (3 - Pfizer risk 4-dose series) 03/09/2020  . FOOT EXAM  12/20/2020  . HEMOGLOBIN A1C  02/21/2021  . OPHTHALMOLOGY EXAM  03/08/2021  . MAMMOGRAM  12/18/2021  . COLONOSCOPY (Pts 45-24yrs Insurance coverage will need to be confirmed)  10/27/2023  . TETANUS/TDAP   12/19/2024  . INFLUENZA VACCINE  Completed  . Hepatitis C Screening  Completed  . PNA vac Low Risk Adult  Completed  . DEXA SCAN  Addressed    Health Maintenance  Health Maintenance Due  Topic Date Due  . COVID-19 Vaccine (3 - Pfizer risk 4-dose series) 03/09/2020  . FOOT EXAM  12/20/2020    Colorectal cancer screening: Type of screening: Colonoscopy. Completed 10/26/20 . Repeat every as directed  years  Mammogram status: Completed 12/18/20. Repeat every year  Bone Density status: Completed 04/25/14. Results reflect: Bone density results: NORMAL. Repeat every 2 years.  Lung Cancer Screening: (Low Dose CT Chest recommended if Age 70-80 years, 30 pack-year currently smoking OR have quit w/in 15years.) does not qualify.    Additional Screening:  Hepatitis C Screening: does qualify; Completed 12/02/17  Vision Screening: Recommended annual ophthalmology exams for early detection of glaucoma and other disorders of the eye. Is the patient up to date with their annual eye exam?  Yes  Who is the provider or what is the name of the office in which the patient attends annual eye exams? Dr. Mallie Mussel  Dental Screening: Recommended annual dental exams for proper oral hygiene  Community Resource Referral / Chronic Care Management: CRR required this visit?  No   CCM required this visit?  No      Plan:     I have personally reviewed and noted the following in the patient's chart:   . Medical and social history . Use  of alcohol, tobacco or illicit drugs  . Current medications and supplements . Functional ability and status . Nutritional status . Physical activity . Advanced directives . List of other physicians . Hospitalizations, surgeries, and ER visits in previous 12 months . Vitals . Screenings to include cognitive, depression, and falls . Referrals and appointments  In addition, I have reviewed and discussed with patient certain preventive protocols, quality metrics, and best  practice recommendations. A written personalized care plan for preventive services as well as general preventive health recommendations were provided to patient.     Reather Littler, LPN   01/21/9557   Nurse Notes: pt awaiting repeat screening recommendations for future colonoscopy. Pt stated Dr Marin Olp was referring her elsewhere for follow up. Pt advised to contact their office for referral information. Pt feeling slightly down to cold weather and worried about people having to take care of her. Pt encouraged to discuss with PCP  Pt scheduled for CPE tomorrow.

## 2020-12-25 ENCOUNTER — Encounter: Payer: Self-pay | Admitting: Internal Medicine

## 2020-12-25 ENCOUNTER — Other Ambulatory Visit: Payer: Self-pay | Admitting: Internal Medicine

## 2020-12-25 ENCOUNTER — Ambulatory Visit (INDEPENDENT_AMBULATORY_CARE_PROVIDER_SITE_OTHER): Payer: Medicare Other | Admitting: Internal Medicine

## 2020-12-25 VITALS — BP 130/60 | HR 69 | Temp 98.2°F | Ht 62.0 in | Wt 159.0 lb

## 2020-12-25 DIAGNOSIS — G40909 Epilepsy, unspecified, not intractable, without status epilepticus: Secondary | ICD-10-CM | POA: Diagnosis not present

## 2020-12-25 DIAGNOSIS — E559 Vitamin D deficiency, unspecified: Secondary | ICD-10-CM | POA: Diagnosis not present

## 2020-12-25 DIAGNOSIS — E785 Hyperlipidemia, unspecified: Secondary | ICD-10-CM | POA: Diagnosis not present

## 2020-12-25 DIAGNOSIS — E118 Type 2 diabetes mellitus with unspecified complications: Secondary | ICD-10-CM | POA: Diagnosis not present

## 2020-12-25 DIAGNOSIS — F39 Unspecified mood [affective] disorder: Secondary | ICD-10-CM | POA: Diagnosis not present

## 2020-12-25 DIAGNOSIS — I10 Essential (primary) hypertension: Secondary | ICD-10-CM

## 2020-12-25 DIAGNOSIS — I700 Atherosclerosis of aorta: Secondary | ICD-10-CM | POA: Diagnosis not present

## 2020-12-25 DIAGNOSIS — E1169 Type 2 diabetes mellitus with other specified complication: Secondary | ICD-10-CM | POA: Diagnosis not present

## 2020-12-25 DIAGNOSIS — Z0000 Encounter for general adult medical examination without abnormal findings: Secondary | ICD-10-CM | POA: Diagnosis not present

## 2020-12-25 MED ORDER — METFORMIN HCL ER 500 MG PO TB24: 1000.0000 mg | ORAL_TABLET | Freq: Two times a day (BID) | ORAL | 3 refills | Status: DC

## 2020-12-25 NOTE — Progress Notes (Signed)
Date:  12/25/2020   Name:  [name]   DOB:  [birth year]   MRN:  [id number]   Chief Complaint: Annual Exam (No Breast Exam needed today- just had mammo last week. No pap- discontinued. Foot Exam. )  REMINGTON SKALSKY is a 75 y.o. female who presents today for her Complete Annual Exam. She feels well. She reports exercising - housework daily but otherwise no regular exercise. She reports she is sleeping well. Breast complaints - none.  Mammogram: 12/2020 [facility] DEXA: 04/2014 Colonoscopy: 10/2020  Immunization History  Administered Date(s) Administered  . Fluad Quad(high Dose 65+) 08/29/2019, 08/23/2020  . Influenza, High Dose Seasonal PF 09/21/2017, 09/02/2018  . Influenza,inj,Quad PF,6+ Mos 08/20/2015, 09/17/2016  . PFIZER(Purple Top)SARS-COV-2 Vaccination 01/10/2020, 02/10/2020  . Pneumococcal Conjugate-13 10/22/2015  . Pneumococcal Polysaccharide-23 07/17/2017  . Tdap 12/19/2014    Hypertension This is a chronic problem. The problem is controlled. Pertinent negatives include no chest pain, headaches, palpitations or shortness of breath. Past treatments include ACE inhibitors, direct vasodilators and beta blockers. The current treatment provides significant improvement. There are no compliance problems.   Diabetes She presents for her follow-up diabetic visit. She has type 2 diabetes mellitus. Her disease course has been stable. Pertinent negatives for hypoglycemia include no dizziness, headaches, nervousness/anxiousness or tremors. Pertinent negatives for diabetes include no chest pain, no fatigue, no polydipsia and no polyuria. Current diabetic treatment includes insulin injections (and metformin). She is compliant with treatment all of the time. She monitors blood glucose at home 1-2 x per day. Her breakfast blood glucose is taken between 8-9 am. Her breakfast blood glucose range is generally 110-130 mg/dl. An ACE inhibitor/angiotensin II receptor blocker is being taken. Eye exam  is current.  Hyperlipidemia This is a chronic problem. The problem is controlled. Pertinent negatives include no chest pain or shortness of breath. Current antihyperlipidemic treatment includes statins. The current treatment provides significant improvement of lipids.    Lab Results  Component Value Date   CREATININE 1.03 (H) 08/23/2020   BUN 12 08/23/2020   NA 137 08/23/2020   K 4.7 08/23/2020   CL 98 08/23/2020   CO2 23 08/23/2020   Lab Results  Component Value Date   CHOL 184 12/21/2019   HDL 96 12/21/2019   LDLCALC 77 12/21/2019   TRIG 60 12/21/2019   CHOLHDL 1.9 12/21/2019   Lab Results  Component Value Date   TSH 1.450 12/21/2019   Lab Results  Component Value Date   HGBA1C 7.3 (H) 08/23/2020   Lab Results  Component Value Date   WBC 8.4 12/21/2019   HGB 12.1 12/21/2019   HCT 36.5 12/21/2019   MCV 91 12/21/2019   PLT 317 12/21/2019   Lab Results  Component Value Date   ALT 12 12/21/2019   AST 15 12/21/2019   ALKPHOS 138 (H) 12/21/2019   BILITOT 0.3 12/21/2019   Last vitamin D Lab Results  Component Value Date   VD25OH 29.5 (L) 12/21/2019      Review of Systems  Constitutional: Negative for chills, fatigue and fever.  HENT: Negative for congestion, hearing loss, tinnitus, trouble swallowing and voice change.   Eyes: Negative for visual disturbance.  Respiratory: Negative for cough, chest tightness, shortness of breath and wheezing.   Cardiovascular: Negative for chest pain, palpitations and leg swelling.  Gastrointestinal: Negative for abdominal pain, constipation, diarrhea and vomiting.  Endocrine: Negative for polydipsia and polyuria.  Genitourinary: Negative for dysuria, frequency, genital sores, vaginal bleeding and vaginal discharge.  Musculoskeletal: Negative for arthralgias, gait problem and joint swelling.  Skin: Negative for color change and rash.  Neurological: Negative for dizziness, tremors, light-headedness and headaches.   Hematological: Negative for adenopathy. Does not bruise/bleed easily.  Psychiatric/Behavioral: Negative for dysphoric mood (due to winter cold and staying indoors) and sleep disturbance. The patient is not nervous/anxious.     Patient Active Problem List   Diagnosis Date Noted  . Mood disorder ([place]) 12/21/2019  . 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 Bilateral carotid artery stenosis 08/24/2018  . Atherosclerosis of aorta ([place]) 08/24/2018  . Type II diabetes mellitus with complication ([place]) 17/61/6073  . Bradycardia 07/24/2018  . Neoplasm of uncertain behavior of skin 01/16/2017  . 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 Essential (primary) hypertension 05/08/2015  . History of rectal cancer 05/08/2015  . Hyperlipidemia associated with type 2 diabetes mellitus ([place]) 05/08/2015  . Seizure disorder ([place]) 05/08/2015  . Shoulder strain 05/08/2015  . Avitaminosis D 05/08/2015    Allergies  Allergen Reactions  . Baclofen Other (See Comments)  . Pioglitazone Nausea Only    Past Surgical History:  Procedure Laterality Date  . CATARACT EXTRACTION, BILATERAL  01/2020   Dr. Theresia Lo  . COLOSTOMY REVERSAL  01/2014  . ILEOSTOMY  05/2013  . UMBILICAL HERNIA REPAIR  05/2018    Social History   Tobacco Use  . Smoking status: Never Smoker  . Smokeless tobacco: Never Used  Vaping Use  . Vaping Use: Never used  Substance Use Topics  . Alcohol use: No    Alcohol/week: 0.0 standard drinks  . Drug use: No     Medication list has been reviewed and updated.  Current Meds  Medication Sig  . aspirin 81 MG chewable tablet Chew 81 mg by mouth daily.   Marland Kitchen atorvastatin (LIPITOR) 40 MG tablet TAKE 1 TABLET BY MOUTH DAILY  . calcium carbonate (OSCAL) 1500 (600 Ca) MG TABS tablet Take 600 mg of elemental calcium by mouth daily.  . carbamazepine (TEGRETOL) 200 MG tablet TAKE 1 TABLET(200 MG) BY MOUTH DAILY  . Cholecalciferol 25 MCG (1000 UT) capsule Take 1,000 Units by mouth daily.   .  Fluocinolone Acetonide 0.01 % OIL INSTILL 4 DROPS INTO BOTH EARS THREE TIMES DAILY AS NEEDED FOR DRYNESS AND ITCHING  . hydrALAZINE (APRESOLINE) 25 MG tablet Take 1 tablet (25 mg total) by mouth 2 (two) times daily.  . hydrochlorothiazide (MICROZIDE) 12.5 MG capsule TAKE 1 CAPSULE(12.5 MG) BY MOUTH DAILY  . insulin glargine (LANTUS SOLOSTAR) 100 UNIT/ML Solostar Pen Inject 20-25 Units into the skin daily. (Patient taking differently: Inject 18-20 Units into the skin daily.)  . meloxicam (MOBIC) 15 MG tablet Take 15 mg by mouth as needed for pain.  . metFORMIN (GLUCOPHAGE-XR) 500 MG 24 hr tablet TAKE 2 TABLETS BY MOUTH TWICE DAILY  . metoprolol succinate (TOPROL-XL) 25 MG 24 hr tablet Take 0.5 tablets (12.5 mg total) by mouth in the morning and at bedtime.  . nitroGLYCERIN (NITROSTAT) 0.4 MG SL tablet Place 1 tablet (0.4 mg total) under the tongue every 5 (five) minutes x 3 doses as needed for chest pain.  Glory Rosebush ULTRA test strip USE 1 STRIP TO CHECK GLUCOSE TWICE DAILY  . Propylene Glycol (SYSTANE BALANCE OP) Apply to eye.  . ramipril (ALTACE) 5 MG capsule TAKE 1 CAPSULE BY MOUTH DAILY    PHQ 2/9 Scores 12/25/2020 12/24/2020 08/23/2020 04/23/2020  PHQ - 2 Score 4 2 4  0  PHQ- 9 Score 6 5 12  1  GAD 7 : Generalized Anxiety Score 12/25/2020 08/23/2020 04/23/2020 12/21/2019  Nervous, Anxious, on Edge 2 2 0 2  Control/stop worrying 2 2 0 3  Worry too much - different things 2 2 0 3  Trouble relaxing 3 1 0 2  Restless 3 1 0 2  Easily annoyed or irritable 0 0 0 0  Afraid - awful might happen 0 0 0 1  Total GAD 7 Score 12 8 0 13  Anxiety Difficulty Not difficult at all Not difficult at all Not difficult at all Somewhat difficult    BP Readings from Last 3 Encounters:  12/25/20 130/60  12/24/20 140/70  08/23/20 136/78    Physical Exam Vitals and nursing note reviewed.  Constitutional:      General: She is not in acute distress.    Appearance: She is well-developed.  HENT:     Head:  Normocephalic and atraumatic.     Right Ear: Tympanic membrane and ear canal normal.     Left Ear: Tympanic membrane and ear canal normal.     Nose:     Right Sinus: No maxillary sinus tenderness.     Left Sinus: No maxillary sinus tenderness.  Eyes:     General: No scleral icterus.       Right eye: No discharge.        Left eye: No discharge.     Conjunctiva/sclera: Conjunctivae normal.  Neck:     Thyroid: No thyromegaly.     Vascular: No carotid bruit.  Cardiovascular:     Rate and Rhythm: Normal rate and regular rhythm.     Pulses: Normal pulses.     Heart sounds: Normal heart sounds.  Pulmonary:     Effort: Pulmonary effort is normal. No respiratory distress.     Breath sounds: No wheezing.  Abdominal:     General: Bowel sounds are normal.     Palpations: Abdomen is soft.     Tenderness: There is no abdominal tenderness.  Musculoskeletal:     Cervical back: Normal range of motion. No erythema.     Right lower leg: No edema.     Left lower leg: No edema.     Right foot: Bunion present.     Left foot: Bunion present.  Feet:     Right foot:     Skin integrity: Skin integrity normal.     Left foot:     Skin integrity: Skin integrity normal.  Lymphadenopathy:     Cervical: No cervical adenopathy.  Skin:    General: Skin is warm and dry.     Findings: No rash.  Neurological:     Mental Status: She is alert and oriented to person, place, and time.     Cranial Nerves: No cranial nerve deficit.     Sensory: No sensory deficit.     Deep Tendon Reflexes: Reflexes are normal and symmetric.  Psychiatric:        Attention and Perception: Attention normal.        Mood and Affect: Mood normal.     Wt Readings from Last 3 Encounters:  12/25/20 159 lb (72.1 kg)  12/24/20 163 lb (73.9 kg)  08/23/20 159 lb (72.1 kg)    BP 130/60   Pulse 69   Temp 98.2 F (36.8 C) (Oral)   Ht 5\' 2"  (1.575 m)   Wt 159 lb (72.1 kg)   SpO2 100%   BMI 29.08 kg/m   Assessment and  Plan: 1. Annual physical exam  Normal exam for age Continue healthy diet, exercise as able  2. Essential (primary) hypertension Clinically stable exam with well controlled BP. Tolerating medications without side effects at this time. Pt to continue current regimen and low sodium diet; benefits of regular exercise as able discussed. - CBC with Differential/Platelet - TSH  3. Type II diabetes mellitus with complication (HCC) Clinically stable by exam and report without s/s of hypoglycemia. DM complicated by HTN, lipids. Tolerating medications well without side effects or other concerns. - Comprehensive metabolic panel - Hemoglobin A1c - metFORMIN (GLUCOPHAGE-XR) 500 MG 24 hr tablet; Take 2 tablets (1,000 mg total) by mouth 2 (two) times daily.  Dispense: 360 tablet; Refill: 3  4. Hyperlipidemia associated with type 2 diabetes mellitus ([place]) Tolerating statin medication without side effects at this time LDL is almost at goal of < 70 on current dose Continue same therapy without change at this time. - Lipid panel  5. Vitamin D deficiency Continue supplement - recheck levels - VITAMIN D 25 Hydroxy (Vit-D Deficiency, Fractures)  6. Mood disorder ([place]) Stable at this time - mostly due to winter weather/cold/indoor isolation She does not want medication at this time but will follow up if symptoms are worsening  7. Atherosclerosis of aorta (HCC) On statin and aspirin  8. Seizure disorder ([place]) controlled - Carbamazepine level, total; Future   Partially dictated using Editor, commissioning. Any errors are unintentional.  Halina Maidens, MD [facility]  12/25/2020

## 2020-12-25 NOTE — Telephone Encounter (Signed)
Requested Prescriptions  Pending Prescriptions Disp Refills  . metFORMIN (GLUCOPHAGE-XR) 500 MG 24 hr tablet [Pharmacy Med Name: METFORMIN ER $RemoveBefo'500MG'[id number]$  24HR TABS] 360 tablet 3    Sig: TAKE 2 TABLETS BY MOUTH 2 TIMES DAILY     Endocrinology:  Diabetes - Biguanides Failed - 12/25/2020  1:46 PM      Failed - Cr in normal range and within 360 days    Creatinine  Date Value Ref Range Status  05/04/2013 1.01 0.60 - 1.30 mg/dL Final   Creatinine, Ser  Date Value Ref Range Status  08/23/2020 1.03 (H) 0.57 - 1.00 mg/dL Final         Failed - eGFR in normal range and within 360 days    EGFR (African American)  Date Value Ref Range Status  05/04/2013 >60  Final   GFR calc Af Amer  Date Value Ref Range Status  08/23/2020 62 >59 mL/min/1.73 Final    Comment:    **[facility] currently reports eGFR in compliance with the current**   recommendations of the [employer]. [facility] will   update reporting as new guidelines are published from the NKF-ASN   Task force.    EGFR (Non-African Amer.)  Date Value Ref Range Status  05/04/2013 58 (L)  Final    Comment:    eGFR values <95mL/min/1.73 m2 may be an indication of chronic kidney disease (CKD). Calculated eGFR is useful in patients with stable renal function. The eGFR calculation will not be reliable in acutely ill patients when serum creatinine is changing rapidly. It is not useful in  patients on dialysis. The eGFR calculation may not be applicable to patients at the low and high extremes of body sizes, pregnant women, and vegetarians.    GFR calc non Af Amer  Date Value Ref Range Status  08/23/2020 54 (L) >59 mL/min/1.73 Final         Passed - HBA1C is between 0 and 7.9 and within 180 days    Hgb A1c MFr Bld  Date Value Ref Range Status  08/23/2020 7.3 (H) 4.8 - 5.6 % Final    Comment:             Prediabetes: 5.7 - 6.4          Diabetes: >6.4          Glycemic control for adults with diabetes: <7.0          Passed  - Valid encounter within last 6 months    Recent Outpatient Visits          Today Annual physical exam   [facility] Glean Hess, MD   4 months ago Type II diabetes mellitus with complication [facility])   [facility] Glean Hess, MD   8 months ago Essential (primary) hypertension   [facility] Glean Hess, MD   1 year ago Annual physical exam   [facility] Glean Hess, MD   1 year ago Syncope, near   [facility] Glean Hess, MD      Future Appointments            In 4 months Army Melia Jesse Sans, MD [facility], [place]   In 1 year Army Melia, Jesse Sans, MD [facility], [facility]

## 2020-12-26 LAB — CBC WITH DIFFERENTIAL/PLATELET
Basophils Absolute: 0.1 10*3/uL (ref 0.0–0.2)
Basos: 1 %
EOS (ABSOLUTE): 0.1 10*3/uL (ref 0.0–0.4)
Eos: 1 %
Hematocrit: 36.1 % (ref 34.0–46.6)
Hemoglobin: 12 g/dL (ref 11.1–15.9)
Immature Grans (Abs): 0 10*3/uL (ref 0.0–0.1)
Immature Granulocytes: 0 %
Lymphocytes Absolute: 0.8 10*3/uL (ref 0.7–3.1)
Lymphs: 10 %
MCH: 29.4 pg (ref 26.6–33.0)
MCHC: 33.2 g/dL (ref 31.5–35.7)
MCV: 89 fL (ref 79–97)
Monocytes Absolute: 1 10*3/uL — ABNORMAL HIGH (ref 0.1–0.9)
Monocytes: 12 %
Neutrophils Absolute: 6.5 10*3/uL (ref 1.4–7.0)
Neutrophils: 76 %
Platelets: 325 10*3/uL (ref 150–450)
RBC: 4.08 x10E6/uL (ref 3.77–5.28)
RDW: 12.2 % (ref 11.7–15.4)
WBC: 8.4 10*3/uL (ref 3.4–10.8)

## 2020-12-26 LAB — COMPREHENSIVE METABOLIC PANEL
ALT: 11 IU/L (ref 0–32)
AST: 17 IU/L (ref 0–40)
Albumin/Globulin Ratio: 2 (ref 1.2–2.2)
Albumin: 4.4 g/dL (ref 3.7–4.7)
Alkaline Phosphatase: 138 IU/L — ABNORMAL HIGH (ref 44–121)
BUN/Creatinine Ratio: 13 (ref 12–28)
BUN: 15 mg/dL (ref 8–27)
Bilirubin Total: 0.3 mg/dL (ref 0.0–1.2)
CO2: 22 mmol/L (ref 20–29)
Calcium: 9.7 mg/dL (ref 8.7–10.3)
Chloride: 97 mmol/L (ref 96–106)
Creatinine, Ser: 1.15 mg/dL — ABNORMAL HIGH (ref 0.57–1.00)
GFR calc Af Amer: 54 mL/min/{1.73_m2} — ABNORMAL LOW (ref >59)
GFR calc non Af Amer: 47 mL/min/{1.73_m2} — ABNORMAL LOW (ref >59)
Globulin, Total: 2.2 g/dL (ref 1.5–4.5)
Glucose: 111 mg/dL — ABNORMAL HIGH (ref 65–99)
Potassium: 4.8 mmol/L (ref 3.5–5.2)
Sodium: 137 mmol/L (ref 134–144)
Total Protein: 6.6 g/dL (ref 6.0–8.5)

## 2020-12-26 LAB — LIPID PANEL
Chol/HDL Ratio: 2.1 ratio (ref 0.0–4.4)
Cholesterol, Total: 204 mg/dL — ABNORMAL HIGH (ref 100–199)
HDL: 99 mg/dL (ref >39)
LDL Chol Calc (NIH): 93 mg/dL (ref 0–99)
Triglycerides: 67 mg/dL (ref 0–149)
VLDL Cholesterol Cal: 12 mg/dL (ref 5–40)

## 2020-12-26 LAB — HEMOGLOBIN A1C
Est. average glucose Bld gHb Est-mCnc: 203 mg/dL
Hgb A1c MFr Bld: 8.7 % — ABNORMAL HIGH (ref 4.8–5.6)

## 2020-12-26 LAB — VITAMIN D 25 HYDROXY (VIT D DEFICIENCY, FRACTURES): Vit D, 25-Hydroxy: 35.3 ng/mL (ref 30.0–100.0)

## 2020-12-26 LAB — TSH: TSH: 1.71 u[IU]/mL (ref 0.450–4.500)

## 2021-02-14 ENCOUNTER — Other Ambulatory Visit: Payer: Self-pay | Admitting: Internal Medicine

## 2021-02-14 DIAGNOSIS — E1169 Type 2 diabetes mellitus with other specified complication: Secondary | ICD-10-CM

## 2021-02-14 NOTE — Telephone Encounter (Signed)
Requested medication (s) are due for refill today: Yes  Requested medication (s) are on the active medication list: Yes  Last refill:  01/26/20  Future visit scheduled: Yes  Notes to clinic:  Prescription has expired.    Requested Prescriptions  Pending Prescriptions Disp Refills   atorvastatin (LIPITOR) 40 MG tablet [Pharmacy Med Name: ATORVASTATIN 40MG  TABLETS] 90 tablet 3    Sig: TAKE 1 TABLET BY MOUTH DAILY      Cardiovascular:  Antilipid - Statins Failed - 02/14/2021 10:43 AM      Failed - Total Cholesterol in normal range and within 360 days    Cholesterol, Total  Date Value Ref Range Status  12/25/2020 204 (H) 100 - 199 mg/dL Final          Failed - LDL in normal range and within 360 days    LDL Chol Calc (NIH)  Date Value Ref Range Status  12/25/2020 93 0 - 99 mg/dL Final          Passed - HDL in normal range and within 360 days    HDL  Date Value Ref Range Status  12/25/2020 99 >39 mg/dL Final          Passed - Triglycerides in normal range and within 360 days    Triglycerides  Date Value Ref Range Status  12/25/2020 67 0 - 149 mg/dL Final          Passed - Patient is not pregnant      Passed - Valid encounter within last 12 months    Recent Outpatient Visits           1 month ago Annual physical exam   [facility] Glean Hess, MD   5 months ago Type II diabetes mellitus with complication [facility])   [facility] Glean Hess, MD   9 months ago Essential (primary) hypertension   [facility] Glean Hess, MD   1 year ago Annual physical exam   [facility] Glean Hess, MD   1 year ago Syncope, near   [facility] Glean Hess, MD       Future Appointments             In 2 months Army Melia Jesse Sans, MD [facility], [place]   In 10 months Army Melia Jesse Sans, MD [facility], [facility]

## 2021-02-15 DIAGNOSIS — D122 Benign neoplasm of ascending colon: Secondary | ICD-10-CM | POA: Diagnosis not present

## 2021-03-15 DIAGNOSIS — Z85038 Personal history of other malignant neoplasm of large intestine: Secondary | ICD-10-CM | POA: Diagnosis not present

## 2021-03-15 DIAGNOSIS — Z794 Long term (current) use of insulin: Secondary | ICD-10-CM | POA: Diagnosis not present

## 2021-03-15 DIAGNOSIS — Z9221 Personal history of antineoplastic chemotherapy: Secondary | ICD-10-CM | POA: Diagnosis not present

## 2021-03-15 DIAGNOSIS — K6389 Other specified diseases of intestine: Secondary | ICD-10-CM | POA: Diagnosis not present

## 2021-03-15 DIAGNOSIS — Z7984 Long term (current) use of oral hypoglycemic drugs: Secondary | ICD-10-CM | POA: Diagnosis not present

## 2021-03-15 DIAGNOSIS — D126 Benign neoplasm of colon, unspecified: Secondary | ICD-10-CM | POA: Diagnosis not present

## 2021-03-15 DIAGNOSIS — K635 Polyp of colon: Secondary | ICD-10-CM | POA: Diagnosis not present

## 2021-03-15 DIAGNOSIS — Z85048 Personal history of other malignant neoplasm of rectum, rectosigmoid junction, and anus: Secondary | ICD-10-CM | POA: Diagnosis not present

## 2021-03-15 DIAGNOSIS — D120 Benign neoplasm of cecum: Secondary | ICD-10-CM | POA: Diagnosis not present

## 2021-03-15 DIAGNOSIS — E119 Type 2 diabetes mellitus without complications: Secondary | ICD-10-CM | POA: Diagnosis not present

## 2021-03-15 DIAGNOSIS — Z9889 Other specified postprocedural states: Secondary | ICD-10-CM | POA: Diagnosis not present

## 2021-03-15 DIAGNOSIS — D123 Benign neoplasm of transverse colon: Secondary | ICD-10-CM | POA: Diagnosis not present

## 2021-03-15 DIAGNOSIS — Z86010 Personal history of colonic polyps: Secondary | ICD-10-CM | POA: Diagnosis not present

## 2021-03-15 DIAGNOSIS — D122 Benign neoplasm of ascending colon: Secondary | ICD-10-CM | POA: Diagnosis not present

## 2021-03-24 ENCOUNTER — Emergency Department: Payer: Medicare Other

## 2021-03-24 ENCOUNTER — Inpatient Hospital Stay
Admission: EM | Admit: 2021-03-24 | Discharge: 2021-03-27 | DRG: 445 | Disposition: A | Discharge status: Home Health | Payer: Medicare Other | Attending: Family Medicine | Admitting: Family Medicine

## 2021-03-24 ENCOUNTER — Other Ambulatory Visit: Payer: Self-pay

## 2021-03-24 DIAGNOSIS — K8020 Calculus of gallbladder without cholecystitis without obstruction: Secondary | ICD-10-CM | POA: Diagnosis not present

## 2021-03-24 DIAGNOSIS — Z9221 Personal history of antineoplastic chemotherapy: Secondary | ICD-10-CM

## 2021-03-24 DIAGNOSIS — K819 Cholecystitis, unspecified: Secondary | ICD-10-CM

## 2021-03-24 DIAGNOSIS — R932 Abnormal findings on diagnostic imaging of liver and biliary tract: Secondary | ICD-10-CM | POA: Diagnosis not present

## 2021-03-24 DIAGNOSIS — Z7982 Long term (current) use of aspirin: Secondary | ICD-10-CM | POA: Diagnosis not present

## 2021-03-24 DIAGNOSIS — E876 Hypokalemia: Secondary | ICD-10-CM | POA: Diagnosis not present

## 2021-03-24 DIAGNOSIS — G40909 Epilepsy, unspecified, not intractable, without status epilepticus: Secondary | ICD-10-CM | POA: Diagnosis not present

## 2021-03-24 DIAGNOSIS — D649 Anemia, unspecified: Secondary | ICD-10-CM | POA: Diagnosis not present

## 2021-03-24 DIAGNOSIS — Z20822 Contact with and (suspected) exposure to covid-19: Secondary | ICD-10-CM | POA: Diagnosis present

## 2021-03-24 DIAGNOSIS — Z978 Presence of other specified devices: Secondary | ICD-10-CM | POA: Diagnosis not present

## 2021-03-24 DIAGNOSIS — Z794 Long term (current) use of insulin: Secondary | ICD-10-CM

## 2021-03-24 DIAGNOSIS — E1122 Type 2 diabetes mellitus with diabetic chronic kidney disease: Secondary | ICD-10-CM | POA: Diagnosis not present

## 2021-03-24 DIAGNOSIS — E785 Hyperlipidemia, unspecified: Secondary | ICD-10-CM | POA: Diagnosis not present

## 2021-03-24 DIAGNOSIS — K8000 Calculus of gallbladder with acute cholecystitis without obstruction: Secondary | ICD-10-CM | POA: Diagnosis not present

## 2021-03-24 DIAGNOSIS — R079 Chest pain, unspecified: Secondary | ICD-10-CM | POA: Diagnosis not present

## 2021-03-24 DIAGNOSIS — I129 Hypertensive chronic kidney disease with stage 1 through stage 4 chronic kidney disease, or unspecified chronic kidney disease: Secondary | ICD-10-CM | POA: Diagnosis present

## 2021-03-24 DIAGNOSIS — Z7984 Long term (current) use of oral hypoglycemic drugs: Secondary | ICD-10-CM

## 2021-03-24 DIAGNOSIS — Z85038 Personal history of other malignant neoplasm of large intestine: Secondary | ICD-10-CM | POA: Diagnosis not present

## 2021-03-24 DIAGNOSIS — Z833 Family history of diabetes mellitus: Secondary | ICD-10-CM

## 2021-03-24 DIAGNOSIS — D259 Leiomyoma of uterus, unspecified: Secondary | ICD-10-CM | POA: Diagnosis not present

## 2021-03-24 DIAGNOSIS — K810 Acute cholecystitis: Secondary | ICD-10-CM

## 2021-03-24 DIAGNOSIS — Z923 Personal history of irradiation: Secondary | ICD-10-CM

## 2021-03-24 DIAGNOSIS — R1011 Right upper quadrant pain: Secondary | ICD-10-CM

## 2021-03-24 DIAGNOSIS — E861 Hypovolemia: Secondary | ICD-10-CM | POA: Diagnosis not present

## 2021-03-24 DIAGNOSIS — E871 Hypo-osmolality and hyponatremia: Secondary | ICD-10-CM | POA: Diagnosis not present

## 2021-03-24 DIAGNOSIS — M47816 Spondylosis without myelopathy or radiculopathy, lumbar region: Secondary | ICD-10-CM | POA: Diagnosis not present

## 2021-03-24 DIAGNOSIS — K828 Other specified diseases of gallbladder: Secondary | ICD-10-CM | POA: Diagnosis not present

## 2021-03-24 DIAGNOSIS — D72829 Elevated white blood cell count, unspecified: Secondary | ICD-10-CM | POA: Diagnosis not present

## 2021-03-24 DIAGNOSIS — R109 Unspecified abdominal pain: Secondary | ICD-10-CM | POA: Diagnosis not present

## 2021-03-24 DIAGNOSIS — I10 Essential (primary) hypertension: Secondary | ICD-10-CM | POA: Diagnosis not present

## 2021-03-24 DIAGNOSIS — N1831 Chronic kidney disease, stage 3a: Secondary | ICD-10-CM | POA: Diagnosis present

## 2021-03-24 DIAGNOSIS — Z79899 Other long term (current) drug therapy: Secondary | ICD-10-CM | POA: Diagnosis not present

## 2021-03-24 LAB — HEPATIC FUNCTION PANEL
ALT: 27 U/L (ref 0–44)
AST: 31 U/L (ref 15–41)
Albumin: 3 g/dL — ABNORMAL LOW (ref 3.5–5.0)
Alkaline Phosphatase: 139 U/L — ABNORMAL HIGH (ref 38–126)
Bilirubin, Direct: 0.2 mg/dL (ref 0.0–0.2)
Indirect Bilirubin: 0.5 mg/dL (ref 0.3–0.9)
Total Bilirubin: 0.7 mg/dL (ref 0.3–1.2)
Total Protein: 6.4 g/dL — ABNORMAL LOW (ref 6.5–8.1)

## 2021-03-24 LAB — CBC
HCT: 31 % — ABNORMAL LOW (ref 36.0–46.0)
Hemoglobin: 10.5 g/dL — ABNORMAL LOW (ref 12.0–15.0)
MCH: 29 pg (ref 26.0–34.0)
MCHC: 33.9 g/dL (ref 30.0–36.0)
MCV: 85.6 fL (ref 80.0–100.0)
Platelets: 349 10*3/uL (ref 150–400)
RBC: 3.62 MIL/uL — ABNORMAL LOW (ref 3.87–5.11)
RDW: 13 % (ref 11.5–15.5)
WBC: 12.9 10*3/uL — ABNORMAL HIGH (ref 4.0–10.5)
nRBC: 0 % (ref 0.0–0.2)

## 2021-03-24 LAB — BASIC METABOLIC PANEL
Anion gap: 12 (ref 5–15)
BUN: 20 mg/dL (ref 8–23)
CO2: 25 mmol/L (ref 22–32)
Calcium: 8.5 mg/dL — ABNORMAL LOW (ref 8.9–10.3)
Chloride: 85 mmol/L — ABNORMAL LOW (ref 98–111)
Creatinine, Ser: 0.96 mg/dL (ref 0.44–1.00)
GFR, Estimated: >60 mL/min (ref >60)
Glucose, Bld: 154 mg/dL — ABNORMAL HIGH (ref 70–99)
Potassium: 3.5 mmol/L (ref 3.5–5.1)
Sodium: 122 mmol/L — ABNORMAL LOW (ref 135–145)

## 2021-03-24 LAB — OSMOLALITY: Osmolality: 255 mOsm/kg — ABNORMAL LOW (ref 275–295)

## 2021-03-24 LAB — LACTIC ACID, PLASMA: Lactic Acid, Venous: 1 mmol/L (ref 0.5–1.9)

## 2021-03-24 LAB — RESP PANEL BY RT-PCR (FLU A&B, COVID) ARPGX2
Influenza A by PCR: NEGATIVE
Influenza B by PCR: NEGATIVE
SARS Coronavirus 2 by RT PCR: NEGATIVE

## 2021-03-24 LAB — TROPONIN I (HIGH SENSITIVITY): Troponin I (High Sensitivity): 11 ng/L (ref <18)

## 2021-03-24 LAB — OSMOLALITY, URINE: Osmolality, Ur: 168 mOsm/kg — ABNORMAL LOW (ref 300–900)

## 2021-03-24 LAB — SODIUM, URINE, RANDOM: Sodium, Ur: 23 mmol/L

## 2021-03-24 MED ORDER — SODIUM CHLORIDE 0.9 % IV BOLUS
500.0000 mL | Freq: Once | INTRAVENOUS | Status: AC
  Administered 2021-03-24: 500 mL via INTRAVENOUS

## 2021-03-24 MED ORDER — PIPERACILLIN-TAZOBACTAM 4.5 G IVPB
4.5000 g | Freq: Once | INTRAVENOUS | Status: AC
  Administered 2021-03-24: 4.5 g via INTRAVENOUS
  Filled 2021-03-24: qty 100

## 2021-03-24 MED ORDER — IOHEXOL 300 MG/ML  SOLN
100.0000 mL | Freq: Once | INTRAMUSCULAR | Status: AC | PRN
  Administered 2021-03-24: 100 mL via INTRAVENOUS

## 2021-03-24 MED ORDER — PIPERACILLIN-TAZOBACTAM 3.375 G IVPB
3.3750 g | Freq: Three times a day (TID) | INTRAVENOUS | Status: DC
  Administered 2021-03-25 – 2021-03-27 (×7): 3.375 g via INTRAVENOUS
  Filled 2021-03-24 (×7): qty 50

## 2021-03-24 NOTE — ED Triage Notes (Signed)
Pt arrives to ER with daughter. Pt had surgery 9 days ago to remove a polyp. States since then has been laying in bed, not eating/drinking as much. Daughter thinks pt is dehydrated. Got report back Thursday. Daughter state she has noticed pt has been more confused lately. Pt states some chest pressure and needing to "expel gas." states constipation.

## 2021-03-24 NOTE — Progress Notes (Signed)
D/w Dr Ellender Hose, AMS  9 day abd pain, severe Hyponatremia . Acute cholecystitis.  Given hyponatremia, not ideal surgical candidate. Recommend Admit to hospitalist, hydration, monitor Na and cholecystostomy tube. We will follow

## 2021-03-24 NOTE — ED Provider Notes (Signed)
[facility] Emergency Department Provider Note  ____________________________________________   Event Date/Time   First MD Initiated Contact with Patient 03/24/21 1929     (approximate)  I have reviewed the triage vital signs and the nursing notes.   HISTORY  Chief Complaint Altered Mental Status and Chest Pain    HPI [name] is a 75 y.o. female  Here with weakness. Pt is s/p recent colonoscopy and polyp removal at [facility]. Over the past week, she's had worsening nausea, loss of appetite, and weakness. She has not wanted to eat anything and has felt increasingly bloated. Little to no solid food x 1 week. Has been trying to drink but having difficulty with that as well. Over the past day or so, pt has had increasing confusion, worsening weakness. Has been having difficulty even getting around the house today. Reports she just doesn't want to or have the energy to do anything. No overt abd pain, but feels very bloated. No specific alleviating factors.        Past Medical History:  Diagnosis Date  . Allergy   . Arthritis of knee, degenerative 05/08/2015  . Basal cell carcinoma 03/2019   nose  . Colon cancer ([place]) 2014   Partial colon resection, chemo + rad tx's.   . Diabetes mellitus without complication ([place])    Pt takes Metformin.  Marland Kitchen Hyperlipidemia   . Hypertension   . SBO (small bowel obstruction) ([place]) 04/20/2018  . Seizures ([place])   . Umbilical hernia with obstruction but no gangrene   . Vitamin D deficiency     Patient Active Problem List   Diagnosis Date Noted  . Mood disorder ([place]) 12/21/2019  . 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 Bilateral carotid artery stenosis 08/24/2018  . Atherosclerosis of aorta ([place]) 08/24/2018  . Type II diabetes mellitus with complication ([place]) 09/10/8526  . Bradycardia 07/24/2018  . Neoplasm of uncertain behavior of skin 01/16/2017  . 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 Essential (primary)  hypertension 05/08/2015  . History of rectal cancer 05/08/2015  . Hyperlipidemia associated with type 2 diabetes mellitus ([place]) 05/08/2015  . Seizure disorder ([place]) 05/08/2015  . Shoulder strain 05/08/2015  . Avitaminosis D 05/08/2015    Past Surgical History:  Procedure Laterality Date  . CATARACT EXTRACTION, BILATERAL  01/2020   Dr. Theresia Lo  . COLOSTOMY REVERSAL  01/2014  . ILEOSTOMY  05/2013  . UMBILICAL HERNIA REPAIR  05/2018    Prior to Admission medications   Medication Sig Start Date End Date Taking? Authorizing Provider  aspirin 81 MG chewable tablet Chew 81 mg by mouth daily.    Yes [provider]  atorvastatin (LIPITOR) 40 MG tablet TAKE 1 TABLET BY MOUTH DAILY 02/14/21  Yes Glean Hess, MD  calcium carbonate (OSCAL) 1500 (600 Ca) MG TABS tablet Take 600 mg of elemental calcium by mouth daily.   Yes [provider]  carbamazepine (TEGRETOL) 200 MG tablet TAKE 1 TABLET(200 MG) BY MOUTH DAILY 08/06/20  Yes Glean Hess, MD  Cholecalciferol 25 MCG (1000 UT) capsule Take 1,000 Units by mouth daily.  09/22/14  Yes [provider]  Fluocinolone Acetonide 0.01 % OIL INSTILL 4 DROPS INTO BOTH EARS THREE TIMES DAILY AS NEEDED FOR DRYNESS AND ITCHING 12/31/18  Yes [provider]  hydrALAZINE (APRESOLINE) 25 MG tablet Take 1 tablet (25 mg total) by mouth 2 (two) times daily. 10/02/20  Yes Glean Hess, MD  hydrochlorothiazide (MICROZIDE) 12.5 MG capsule  TAKE 1 CAPSULE(12.5 MG) BY MOUTH DAILY 11/03/20  Yes Glean Hess, MD  insulin glargine (LANTUS SOLOSTAR) 100 UNIT/ML Solostar Pen Inject 20-25 Units into the skin daily. Patient taking differently: Inject 18-20 Units into the skin daily. 04/25/20  Yes Glean Hess, MD  meloxicam (MOBIC) 15 MG tablet Take 15 mg by mouth as needed for pain.   Yes [provider]  metFORMIN (GLUCOPHAGE-XR) 500 MG 24 hr tablet TAKE 2 TABLETS BY MOUTH 2 TIMES DAILY 12/25/20  Yes Glean Hess, MD  metoprolol succinate (TOPROL-XL) 25 MG 24 hr tablet Take 0.5 tablets (12.5 mg total) by mouth in the morning and at bedtime. 08/23/20 08/23/21 Yes Glean Hess, MD  nitroGLYCERIN (NITROSTAT) 0.4 MG SL tablet Place 1 tablet (0.4 mg total) under the tongue every 5 (five) minutes x 3 doses as needed for chest pain. 07/26/18  Yes Demetrios Loll, MD  [facility] ULTRA test strip USE 1 STRIP TO CHECK GLUCOSE TWICE DAILY 12/17/20  Yes Glean Hess, MD  Propylene Glycol (SYSTANE BALANCE OP) Apply to eye.   Yes [provider]  ramipril (ALTACE) 5 MG capsule TAKE 1 CAPSULE BY MOUTH DAILY 11/12/20  Yes Glean Hess, MD    Allergies Baclofen and Pioglitazone  Family History  Problem Relation Age of Onset  . Cancer Mother 31       unknown type  . Diabetes Daughter   . Diabetes Son     Social History Social History   Tobacco Use  . Smoking status: Never Smoker  . Smokeless tobacco: Never Used  Vaping Use  . Vaping Use: Never used  Substance Use Topics  . Alcohol use: No    Alcohol/week: 0.0 standard drinks  . Drug use: No    Review of Systems  Review of Systems  Constitutional: Positive for fatigue. Negative for fever.  HENT: Negative for congestion and sore throat.   Eyes: Negative for visual disturbance.  Respiratory: Negative for cough and shortness of breath.   Cardiovascular: Negative for chest pain.  Gastrointestinal: Positive for abdominal distention, abdominal pain and nausea. Negative for diarrhea and vomiting.  Genitourinary: Negative for flank pain.  Musculoskeletal: Negative for back pain and neck pain.  Skin: Negative for rash and wound.  Neurological: Positive for weakness.  Psychiatric/Behavioral: Positive for confusion.  All other systems reviewed and are negative.    ____________________________________________  PHYSICAL EXAM:      VITAL SIGNS: ED Triage Vitals  Enc Vitals Group     BP 03/24/21 1839 (!) 170/95     Pulse Rate 03/24/21  1839 74     Resp 03/24/21 1839 18     Temp 03/24/21 1839 98.3 F (36.8 C)     Temp Source 03/24/21 1839 Oral     SpO2 03/24/21 1839 100 %     Weight 03/24/21 1840 168 lb (76.2 kg)     Height 03/24/21 1840 5\' 2"  (1.575 m)     Head Circumference --      Peak Flow --      Pain Score 03/24/21 1840 7     Pain Loc --      Pain Edu? --      Excl. in [place]? --      Physical Exam Vitals and nursing note reviewed.  Constitutional:      General: She is not in acute distress.    Appearance: She is well-developed.  HENT:     Head: Normocephalic and atraumatic.  Mouth/Throat:     Mouth: Mucous membranes are dry.  Eyes:     Conjunctiva/sclera: Conjunctivae normal.  Cardiovascular:     Rate and Rhythm: Normal rate and regular rhythm.     Heart sounds: Normal heart sounds. No murmur heard. No friction rub.  Pulmonary:     Effort: Pulmonary effort is normal. No respiratory distress.     Breath sounds: Normal breath sounds. No wheezing or rales.  Abdominal:     General: There is distension.     Palpations: Abdomen is soft.     Tenderness: There is no abdominal tenderness.  Musculoskeletal:     Cervical back: Neck supple.  Skin:    General: Skin is warm.     Capillary Refill: Capillary refill takes less than 2 seconds.  Neurological:     Mental Status: She is alert and oriented to person, place, and time.     Motor: No abnormal muscle tone.       ____________________________________________   LABS (all labs ordered are listed, but only abnormal results are displayed)  Labs Reviewed  BASIC METABOLIC PANEL - Abnormal; Notable for the following components:      Result Value   Sodium 122 (*)    Chloride 85 (*)    Glucose, Bld 154 (*)    Calcium 8.5 (*)    All other components within normal limits  CBC - Abnormal; Notable for the following components:   WBC 12.9 (*)    RBC 3.62 (*)    Hemoglobin 10.5 (*)    HCT 31.0 (*)    All other components within normal limits   HEPATIC FUNCTION PANEL - Abnormal; Notable for the following components:   Total Protein 6.4 (*)    Albumin 3.0 (*)    Alkaline Phosphatase 139 (*)    All other components within normal limits  OSMOLALITY - Abnormal; Notable for the following components:   Osmolality 255 (*)    All other components within normal limits  OSMOLALITY, URINE - Abnormal; Notable for the following components:   Osmolality, Ur 168 (*)    All other components within normal limits  RESP PANEL BY RT-PCR (FLU A&B, COVID) ARPGX2  CULTURE, BLOOD (ROUTINE X 2)  CULTURE, BLOOD (ROUTINE X 2)  LACTIC ACID, PLASMA  SODIUM, URINE, RANDOM  LACTIC ACID, PLASMA  TROPONIN I (HIGH SENSITIVITY)  TROPONIN I (HIGH SENSITIVITY)    ____________________________________________  EKG: Normal sinus rhythm, VR 70. PR 172, QRS 80, QTc 442. No acute St elevations or depressions. ________________________________________  RADIOLOGY All imaging, including plain films, CT scans, and ultrasounds, independently reviewed by me, and interpretations confirmed via formal radiology reads.  ED MD interpretation:   Ultrasound: Gallbladder sludge and cholelithiasis with wall thickening pericholecystic fluid CT abdomen/pelvis: Concern for acute cholecystitis, no signs of colon perforation  Official radiology report(s): DG Chest 2 View  Result Date: 03/24/2021 CLINICAL DATA:  Chest pain EXAM: CHEST - 2 VIEW COMPARISON:  05/13/2006 FINDINGS: The heart size and mediastinal contours are within normal limits. Both lungs are clear. The visualized skeletal structures are unremarkable. IMPRESSION: No active cardiopulmonary disease. Electronically Signed   By: Ulyses Jarred M.D.   On: 03/24/2021 19:57   CT ABDOMEN PELVIS W CONTRAST  Result Date: 03/24/2021 CLINICAL DATA:  Recent colonoscopy with polyp removal, new onset abdominal pain with decreased appetite EXAM: CT ABDOMEN AND PELVIS WITH CONTRAST TECHNIQUE: Multidetector CT imaging of the abdomen  and pelvis was performed using the standard protocol following bolus administration of  intravenous contrast. CONTRAST:  186mL OMNIPAQUE IOHEXOL 300 MG/ML  SOLN COMPARISON:  05/22/2018 FINDINGS: Lower chest: No acute abnormality. Hepatobiliary: Liver is within normal limits. Gallbladder is well distended with evidence of a rim calcified gallstone. Wall thickening and mild pericholecystic inflammatory changes noted suggestive of acute cholecystitis. No biliary ductal dilatation is seen. Pancreas: Unremarkable. No pancreatic ductal dilatation or surrounding inflammatory changes. Spleen: Normal in size without focal abnormality. Adrenals/Urinary Tract: Adrenal glands are unremarkable. Kidneys demonstrate a normal enhancement pattern bilaterally. No renal calculi or obstructive changes are seen. Normal excretion is noted on delayed images. Bladder is partially distended. Stomach/Bowel: Postsurgical changes are noted within the colon. No obstructive or inflammatory changes are seen. No findings to suggest perforation related to the recent colonoscopy are noted. The appendix is within normal limits. Small bowel and stomach are unremarkable. Vascular/Lymphatic: Aortic atherosclerosis. No enlarged abdominal or pelvic lymph nodes. Reproductive: Calcified uterine fibroids are noted stable from the prior exam. No adnexal mass is noted. Other: No abdominal wall hernia or abnormality. No abdominopelvic ascites. Musculoskeletal: Degenerative changes of lumbar spine are noted. IMPRESSION: Changes suggestive of acute calculus cholecystitis. Ultrasound may be helpful for further evaluation. Postsurgical changes are seen in the colon. No acute abnormality related to the recent colonoscopy is seen. Electronically Signed   By: Inez Catalina M.D.   On: 03/24/2021 20:33   US Abdomen Limited RUQ (LIVER/GB)  Result Date: 03/24/2021 CLINICAL DATA:  Right upper quadrant pain EXAM: ULTRASOUND ABDOMEN LIMITED RIGHT UPPER QUADRANT COMPARISON:   CT from earlier in the same day. FINDINGS: Gallbladder: Gallbladder is well distended with cholelithiasis and gallbladder sludge identified. Negative sonographic Murphy's sign is seen although wall thickening to 13 mm is noted with pericholecystic fluid. Common bile duct: Diameter: 3.1 mm. Liver: There is a 1.5 cm hyperechoic lesion better visualized than on the recent CT examination consistent with a focal hepatic hemangioma. No other focal lesion is noted. Liver echogenicity is otherwise within normal limits. Portal vein is patent on color Doppler imaging with normal direction of blood flow towards the liver. Other: None. IMPRESSION: Gallbladder sludge and cholelithiasis with wall thickening and pericholecystic fluid. A negative sonographic Murphy's sign is elicited however these changes remain suspicious for acute cholecystitis. Hyperechoic lesion in the liver adjacent to the portal vein consistent with hemangioma. Electronically Signed   By: Inez Catalina M.D.   On: 03/24/2021 21:47    ____________________________________________  PROCEDURES   Procedure(s) performed (including Critical Care):  .Critical Care Performed by: Duffy Bruce, MD Authorized by: Duffy Bruce, MD   Critical care provider statement:    Critical care time (minutes):  35   Critical care time was exclusive of:  Separately billable procedures and treating other patients and teaching time   Critical care was necessary to treat or prevent imminent or life-threatening deterioration of the following conditions:  Cardiac failure, circulatory failure and sepsis   Critical care was time spent personally by me on the following activities:  Development of treatment plan with patient or surrogate, discussions with consultants, evaluation of patient's response to treatment, examination of patient, obtaining history from patient or surrogate, ordering and performing treatments and interventions, ordering and review of laboratory  studies, ordering and review of radiographic studies, pulse oximetry, re-evaluation of patient's condition and review of old charts   I assumed direction of critical care for this patient from another provider in my specialty: no      ____________________________________________  INITIAL IMPRESSION / MDM / ASSESSMENT AND PLAN / ED  COURSE  As part of my medical decision making, I reviewed the following data within the [place] notes reviewed and incorporated, Old chart reviewed, Notes from prior ED visits, and [place] Controlled Substance Database       *[name] was evaluated in Emergency Department on 03/24/2021 for the symptoms described in the history of present illness. She was evaluated in the context of the global COVID-19 pandemic, which necessitated consideration that the patient might be at risk for infection with the SARS-CoV-2 virus that causes COVID-19. Institutional protocols and algorithms that pertain to the evaluation of patients at risk for COVID-19 are in a state of rapid change based on information released by regulatory bodies including the CDC and federal and state organizations. These policies and algorithms were followed during the patient's care in the ED.  Some ED evaluations and interventions may be delayed as a result of limited staffing during the pandemic.*     Medical Decision Making: 75 year old female here with confusion, weakness, poor p.o. intake.  Lab work shows significant hyponatremia which I suspect is hypovolemic due to poor p.o. intake.  She has moderate leukocytosis.  CT scan obtained, shows no evidence of complication related to her colonoscopy, but does show possible acute cholecystitis.  Ultrasound confirms pericholecystic fluid and stones, though no overt Murphy's tenderness, although this could be related to her age as well as mild confusion.  Discussed findings with Dr. Dahlia Byes, will start IV antibiotics and admit to medicine  for management of her hyponatremia and further observation.  May ultimately need a drain placed.  COVID sent.  ____________________________________________  FINAL CLINICAL IMPRESSION(S) / ED DIAGNOSES  Final diagnoses:  RUQ pain  Hyponatremia  Acute cholecystitis     MEDICATIONS GIVEN DURING THIS VISIT:  Medications  piperacillin-tazobactam (ZOSYN) IVPB 4.5 g (has no administration in time range)  sodium chloride 0.9 % bolus 500 mL (0 mLs Intravenous Stopped 03/24/21 2145)  iohexol (OMNIPAQUE) 300 MG/ML solution 100 mL (100 mLs Intravenous Contrast Given 03/24/21 2015)     ED Discharge Orders    None       Note:  This document was prepared using Dragon voice recognition software and may include unintentional dictation errors.   Duffy Bruce, MD 03/24/21 2251

## 2021-03-24 NOTE — ED Notes (Signed)
Pharmacy messaged for missing dose of Zosyn.

## 2021-03-24 NOTE — ED Notes (Signed)
Pharmacy messaged regarding dose of abx still missing from ED.

## 2021-03-24 NOTE — Progress Notes (Signed)
Pharmacy Antibiotic Note  [name] is a 75 y.o. female admitted on 03/24/2021 with intraabdominal infection.  Pharmacy has been consulted for Zosyn dosing.  Plan: Zosyn 3.375g IV q8h (4 hour infusion).  Pharmacy will continue to follow and adjust abx dosing if warranted.  Height: 5\' 2"  (157.5 cm) Weight: 76.2 kg (168 lb) IBW/kg (Calculated) : 50.1  Temp (24hrs), Avg:98.3 F (36.8 C), Min:98.3 F (36.8 C), Max:98.3 F (36.8 C)  Recent Labs  Lab 03/24/21 1843 03/24/21 1932  WBC 12.9*  --   CREATININE 0.96  --   LATICACIDVEN  --  1.0    Estimated Creatinine Clearance: 48.4 mL/min (by C-G formula based on SCr of 0.96 mg/dL).    Allergies  Allergen Reactions  . Baclofen Other (See Comments)  . Pioglitazone Nausea Only    Antimicrobials this admission: 05/08 Zosyn >>   Microbiology results: 05/08 BCx: Pending  Thank you for allowing pharmacy to be a part of this patient's care.  Renda Rolls, PharmD, [facility] 03/24/2021 11:41 PM

## 2021-03-24 NOTE — H&P (Signed)
History and Physical   [facility] [place] @ [facility] Admission History and Physical [employer], D.O.    Patient Name: [name] MR#: [id number] Date of Birth: 1946-02-11 Date of Admission: 03/24/2021  Referring MD/NP/PA: Dr. Ellender Hose Primary Care Physician: Glean Hess, MD  Chief Complaint:  Chief Complaint  Patient presents with  . Altered Mental Status  . Chest Pain    HPI: [name] is a 75 y.o. female with a known history of colon cancer status [name], [name], [name], [name], [name], Jade disorder presents to the emergency department for evaluation of decreased appetite.  Patient was in a usual state of health until 1 week ago she had a colonoscopy with polyp removal at [facility].  She has developed significant nausea, decreased p.o. intake secondary to nausea, low appetite, abdominal bloating.  She is having difficulty with solids and liquids.  For the past day she is been increasingly weak with some confusion and difficulty with her activities of daily living..  Patient denies fevers/chills, dizziness, chest pain, shortness of breath,dysuria/frequency.   Otherwise there has been no change in status. Patient has been taking medication as prescribed and there has been no recent change in medication or diet.  No recent antibiotics.  There has been no recent illness, hospitalizations, travel or sick contacts.    EMS/ED Course: Patient received Zosyn. Medical admission has been requested for further management of possible acute cholecystitis, hypovolemic hyponatremia.  Review of Systems:  CONSTITUTIONAL: Weakness.  No fever/chills, fatigue, weight gain/loss, headache. EYES: No blurry or double vision. ENT: No tinnitus, postnasal drip, redness or soreness of the oropharynx. RESPIRATORY: No cough, dyspnea, wheeze.  No hemoptysis.  CARDIOVASCULAR: No chest pain, palpitations, syncope, orthopnea. No lower extremity  edema.  GASTROINTESTINAL: Positive decreased appetite, nausea, bloating, novomiting, abdominal pain, diarrhea, constipation.  No hematemesis, melena or hematochezia. GENITOURINARY: No dysuria, frequency, hematuria. ENDOCRINE: No polyuria or nocturia. No heat or cold intolerance. HEMATOLOGY: No anemia, bruising, bleeding. INTEGUMENTARY: No rashes, ulcers, lesions. MUSCULOSKELETAL: No arthritis, gout, dyspnea. NEUROLOGIC: No numbness, tingling, ataxia, Jade-type activity.  PSYCHIATRIC: No anxiety, depression, insomnia.   Past Medical History:  Diagnosis Date  . Allergy   . Arthritis of knee, degenerative 05/08/2015  . Basal cell carcinoma 03/2019   nose  . Colon cancer ([place]) 2014   Partial colon resection, chemo + rad tx's.   . [name] mellitus without complication ([place])    Pt takes Metformin.  Marland Kitchen [name]   . [name]   . SBO (Jade bowel obstruction) ([place]) 04/20/2018  . Seizures ([place])   . Umbilical hernia with obstruction but no gangrene   . Vitamin D deficiency     Past Surgical History:  Procedure Laterality Date  . CATARACT EXTRACTION, BILATERAL  01/2020   Dr. Theresia Lo  . COLOSTOMY REVERSAL  01/2014  . ILEOSTOMY  05/2013  . UMBILICAL HERNIA REPAIR  05/2018     reports that she has never smoked. She has never used smokeless tobacco. She reports that she does not drink alcohol and does not use drugs.  Allergies  Allergen Reactions  . Baclofen Other (See Comments)  . Pioglitazone Nausea Only    Family History  Problem Relation Age of Onset  . Cancer Mother 48       unknown type  . [name] Daughter   . [name] Son     Prior to Admission medications   Medication Sig Start Date End Date Taking? Authorizing Provider  aspirin 81 MG chewable tablet Chew  81 mg by mouth daily.    Yes [provider]  atorvastatin (LIPITOR) 40 MG tablet TAKE 1 TABLET BY MOUTH DAILY 02/14/21  Yes Glean Hess, MD  calcium carbonate (OSCAL) 1500 (600 Ca) MG TABS  tablet Take 600 mg of elemental calcium by mouth daily.   Yes [provider]  carbamazepine (TEGRETOL) 200 MG tablet TAKE 1 TABLET(200 MG) BY MOUTH DAILY 08/06/20  Yes Glean Hess, MD  Cholecalciferol 25 MCG (1000 UT) capsule Take 1,000 Units by mouth daily.  09/22/14  Yes [provider]  Fluocinolone Acetonide 0.01 % OIL INSTILL 4 DROPS INTO BOTH EARS THREE TIMES DAILY AS NEEDED FOR DRYNESS AND ITCHING 12/31/18  Yes [provider]  hydrALAZINE (APRESOLINE) 25 MG tablet Take 1 tablet (25 mg total) by mouth 2 (two) times daily. 10/02/20  Yes Glean Hess, MD  hydrochlorothiazide (MICROZIDE) 12.5 MG capsule TAKE 1 CAPSULE(12.5 MG) BY MOUTH DAILY 11/03/20  Yes Glean Hess, MD  insulin glargine (LANTUS SOLOSTAR) 100 UNIT/ML Solostar Pen Inject 20-25 Units into the skin daily. Patient taking differently: Inject 18-20 Units into the skin daily. 04/25/20  Yes Glean Hess, MD  meloxicam (MOBIC) 15 MG tablet Take 15 mg by mouth as needed for pain.   Yes [provider]  metFORMIN (GLUCOPHAGE-XR) 500 MG 24 hr tablet TAKE 2 TABLETS BY MOUTH 2 TIMES DAILY 12/25/20  Yes Glean Hess, MD  metoprolol succinate (TOPROL-XL) 25 MG 24 hr tablet Take 0.5 tablets (12.5 mg total) by mouth in the morning and at bedtime. 08/23/20 08/23/21 Yes Glean Hess, MD  nitroGLYCERIN (NITROSTAT) 0.4 MG SL tablet Place 1 tablet (0.4 mg total) under the tongue every 5 (five) minutes x 3 doses as needed for chest pain. 07/26/18  Yes Demetrios Loll, MD  [facility] ULTRA test strip USE 1 STRIP TO CHECK GLUCOSE TWICE DAILY 12/17/20  Yes Glean Hess, MD  Propylene Glycol (SYSTANE BALANCE OP) Apply to eye.   Yes [provider]  ramipril (ALTACE) 5 MG capsule TAKE 1 CAPSULE BY MOUTH DAILY 11/12/20  Yes Glean Hess, MD    Physical Exam: Vitals:   03/24/21 1839 03/24/21 1840 03/24/21 2049 03/24/21 2230  BP: (!) 170/95  (!) 161/86 (!) 155/63  Pulse: 74  82 76   Resp: 18  18 18   Temp: 98.3 F (36.8 C)     TempSrc: Oral     SpO2: 100%  100% 94%  Weight:  76.2 kg    Height:  5\' 2"  (1.575 m)      GENERAL: 74 y.o.-year-old white female patient, well-developed, well-nourished lying in the bed in no acute distress.  Pleasant and cooperative.   HEENT: Head atraumatic, normocephalic. Anicteric Mucus membranes dry NECK: Supple. No JVD. CHEST: Normal breath sounds bilaterally. No wheezing, rales, rhonchi or crackles. No use of accessory muscles of respiration.  No reproducible chest wall tenderness.  CARDIOVASCULAR: S1, S2 normal. No murmurs, rubs, or gallops. Cap refill <2 seconds. Pulses intact distally.  ABDOMEN: RUQ TTP.  No rebound, guarding, rigidity. Normoactive bowel sounds present in all four quadrants.  EXTREMITIES: No pedal edema, cyanosis, or clubbing. No calf tenderness or Homan's sign.  NEUROLOGIC: The patient is alert and oriented x 3. Cranial nerves II through XII are grossly intact with no focal sensorimotor deficit. PSYCHIATRIC:  Normal affect, mood, thought content. SKIN: Warm, dry, and intact without obvious rash, lesion, or ulcer.    Labs on Admission:  CBC: Recent Labs  Lab 03/24/21 1843  WBC 12.9*  HGB 10.5*  HCT 31.0*  MCV 85.6  PLT 782   Basic Metabolic Panel: Recent Labs  Lab 03/24/21 1843  NA 122*  K 3.5  CL 85*  CO2 25  GLUCOSE 154*  BUN 20  CREATININE 0.96  CALCIUM 8.5*   GFR: Estimated Creatinine Clearance: 48.4 mL/min (by C-G formula based on SCr of 0.96 mg/dL). Liver Function Tests: Recent Labs  Lab 03/24/21 1955  AST 31  ALT 27  ALKPHOS 139*  BILITOT 0.7  PROT 6.4*  ALBUMIN 3.0*   No results for input(s): LIPASE, AMYLASE in the last 168 hours. No results for input(s): AMMONIA in the last 168 hours. Coagulation Profile: No results for input(s): INR, PROTIME in the last 168 hours. Cardiac Enzymes: No results for input(s): CKTOTAL, CKMB, CKMBINDEX, TROPONINI in the last 168 hours. BNP  (last 3 results) No results for input(s): PROBNP in the last 8760 hours. HbA1C: No results for input(s): HGBA1C in the last 72 hours. CBG: No results for input(s): GLUCAP in the last 168 hours. Lipid Profile: No results for input(s): CHOL, HDL, LDLCALC, TRIG, CHOLHDL, LDLDIRECT in the last 72 hours. Thyroid Function Tests: No results for input(s): TSH, T4TOTAL, FREET4, T3FREE, THYROIDAB in the last 72 hours. Anemia Panel: No results for input(s): VITAMINB12, FOLATE, FERRITIN, TIBC, IRON, RETICCTPCT in the last 72 hours. Urine analysis:    Component Value Date/Time   COLORURINE YELLOW (A) 07/24/2018 1247   APPEARANCEUR CLEAR (A) 07/24/2018 1247   LABSPEC 1.011 07/24/2018 1247   PHURINE 8.0 07/24/2018 1247   GLUCOSEU NEGATIVE 07/24/2018 1247   HGBUR NEGATIVE 07/24/2018 1247   BILIRUBINUR neg 12/21/2019 1125   KETONESUR NEGATIVE 07/24/2018 1247   PROTEINUR Negative 12/21/2019 1125   PROTEINUR NEGATIVE 07/24/2018 1247   UROBILINOGEN 0.2 12/21/2019 1125   NITRITE neg 12/21/2019 1125   NITRITE NEGATIVE 07/24/2018 1247   LEUKOCYTESUR Negative 12/21/2019 1125   Sepsis Labs: @LABRCNTIP (procalcitonin:4,lacticidven:4) ) Recent Results (from the past 240 hour(s))  Resp Panel by RT-PCR (Flu A&B, Covid) Nasopharyngeal Swab     Status: None   Collection Time: 03/24/21  9:08 PM   Specimen: Nasopharyngeal Swab; Nasopharyngeal(NP) swabs in vial transport medium  Result Value Ref Range Status   SARS Coronavirus 2 by RT PCR NEGATIVE NEGATIVE Final    Comment: (NOTE) SARS-CoV-2 target nucleic acids are NOT DETECTED.  The SARS-CoV-2 RNA is generally detectable in upper respiratory specimens during the acute phase of infection. The lowest concentration of SARS-CoV-2 viral copies this assay can detect is 138 copies/mL. A negative result does not preclude SARS-Cov-2 infection and should not be used as the sole basis for treatment or other patient management decisions. A negative result may  occur with  improper specimen collection/handling, submission of specimen other than nasopharyngeal swab, presence of viral mutation(s) within the areas targeted by this assay, and inadequate number of viral copies(<138 copies/mL). A negative result must be combined with clinical observations, patient history, and epidemiological information. The expected result is Negative.  Fact Sheet for Patients:  EntrepreneurPulse.com.au  Fact Sheet for Healthcare Providers:  IncredibleEmployment.be  This test is no t yet approved or cleared by the [place] FDA and  has been authorized for detection and/or diagnosis of SARS-CoV-2 by FDA under an Emergency Use Authorization (EUA). This EUA will remain  in effect (meaning this test can be used) for the duration of the COVID-19 declaration under Section 564(b)(1) of the Act, 21 U.S.C.section 360bbb-3(b)(1), unless the authorization is terminated  or revoked sooner.       Influenza A by PCR NEGATIVE NEGATIVE Final   Influenza B by PCR NEGATIVE NEGATIVE Final    Comment: (NOTE) The Xpert Xpress SARS-CoV-2/FLU/RSV plus assay is intended as an aid in the diagnosis of influenza from Nasopharyngeal swab specimens and should not be used as a sole basis for treatment. Nasal washings and aspirates are unacceptable for Xpert Xpress SARS-CoV-2/FLU/RSV testing.  Fact Sheet for Patients: EntrepreneurPulse.com.au  Fact Sheet for Healthcare Providers: IncredibleEmployment.be  This test is not yet approved or cleared by the [place] FDA and has been authorized for detection and/or diagnosis of SARS-CoV-2 by FDA under an Emergency Use Authorization (EUA). This EUA will remain in effect (meaning this test can be used) for the duration of the COVID-19 declaration under Section 564(b)(1) of the Act, 21 U.S.C. section 360bbb-3(b)(1), unless the authorization is terminated  or revoked.  Performed at [facility], [address] [place], [place] [address]      Radiological Exams on Admission: DG Chest 2 View  Result Date: 03/24/2021 CLINICAL DATA:  Chest pain EXAM: CHEST - 2 VIEW COMPARISON:  05/13/2006 FINDINGS: The heart size and mediastinal contours are within normal limits. Both lungs are clear. The visualized skeletal structures are unremarkable. IMPRESSION: No active cardiopulmonary disease. Electronically Signed   By: Ulyses Jarred M.D.   On: 03/24/2021 19:57   CT ABDOMEN PELVIS W CONTRAST  Result Date: 03/24/2021 CLINICAL DATA:  Recent colonoscopy with polyp removal, new onset abdominal pain with decreased appetite EXAM: CT ABDOMEN AND PELVIS WITH CONTRAST TECHNIQUE: Multidetector CT imaging of the abdomen and pelvis was performed using the standard protocol following bolus administration of intravenous contrast. CONTRAST:  142mL OMNIPAQUE IOHEXOL 300 MG/ML  SOLN COMPARISON:  05/22/2018 FINDINGS: Lower chest: No acute abnormality. Hepatobiliary: Liver is within normal limits. Gallbladder is well distended with evidence of a rim calcified gallstone. Wall thickening and mild pericholecystic inflammatory changes noted suggestive of acute cholecystitis. No biliary ductal dilatation is seen. Pancreas: Unremarkable. No pancreatic ductal dilatation or surrounding inflammatory changes. Spleen: Normal in size without focal abnormality. Adrenals/Urinary Tract: Adrenal glands are unremarkable. Kidneys demonstrate a normal enhancement pattern bilaterally. No renal calculi or obstructive changes are seen. Normal excretion is noted on delayed images. Bladder is partially distended. Stomach/Bowel: Postsurgical changes are noted within the colon. No obstructive or inflammatory changes are seen. No findings to suggest perforation related to the recent colonoscopy are noted. The appendix is within normal limits. Jade bowel and stomach are unremarkable. Vascular/Lymphatic:  Aortic atherosclerosis. No enlarged abdominal or pelvic lymph nodes. Reproductive: Calcified uterine fibroids are noted stable from the prior exam. No adnexal mass is noted. Other: No abdominal wall hernia or abnormality. No abdominopelvic ascites. Musculoskeletal: Degenerative changes of lumbar spine are noted. IMPRESSION: Changes suggestive of acute calculus cholecystitis. Ultrasound may be helpful for further evaluation. Postsurgical changes are seen in the colon. No acute abnormality related to the recent colonoscopy is seen. Electronically Signed   By: Inez Catalina M.D.   On: 03/24/2021 20:33   US Abdomen Limited RUQ (LIVER/GB)  Result Date: 03/24/2021 CLINICAL DATA:  Right upper quadrant pain EXAM: ULTRASOUND ABDOMEN LIMITED RIGHT UPPER QUADRANT COMPARISON:  CT from earlier in the same day. FINDINGS: Gallbladder: Gallbladder is well distended with cholelithiasis and gallbladder sludge identified. Negative sonographic Murphy's sign is seen although wall thickening to 13 mm is noted with pericholecystic fluid. Common bile duct: Diameter: 3.1 mm. Liver: There is a 1.5 cm hyperechoic lesion better  visualized than on the recent CT examination consistent with a focal hepatic hemangioma. No other focal lesion is noted. Liver echogenicity is otherwise within normal limits. Portal vein is patent on color Doppler imaging with normal direction of blood flow towards the liver. Other: None. IMPRESSION: Gallbladder sludge and cholelithiasis with wall thickening and pericholecystic fluid. A negative sonographic Murphy's sign is elicited however these changes remain suspicious for acute cholecystitis. Hyperechoic lesion in the liver adjacent to the portal vein consistent with hemangioma. Electronically Signed   By: Inez Catalina M.D.   On: 03/24/2021 21:47   Assessment/Plan  This is a 75 y.o. female with a history of colon cancer status [name], [name], [name], [name], [name],  Jade disorder now being admitted with:  #. Acute calculous cholecystitis - Admit inpatient - IV Zosyn - Surgery to consult and manage  #. Hypovolemic hyponatremia 2/2 decreased PO intake - IV NS  #.  History of colon cancer, followed with Dr. Mike Gip, now Dr. Zenia Resides   #. History of [name] - Continue Lantus 1/2 dose - Hold metformin  #. History of [name] - Continue hydralazine, hydrochlorothiazide, metoprolol, ramipril, hold for hypotension  #. History of [name] - Continue atorvastatin  #. History of Jade disorder - Continue Tegretol - Jade precautions  Admission status: Inpatient IV Fluids: Normal saline Diet/Nutrition: N.p.o. Consults called: Surgery, Dr. Dahlia Byes was called by emergency department physician DVT Px: Heparin, SCDs and early ambulation. Code Status: Full Code  Disposition Plan: To be determined  All the records are reviewed and case discussed with ED provider. Management plans discussed with the patient and/or family who express understanding and agree with plan of care.  Karanvir [place]derston D.O. on 03/24/2021 at 10:40 PM CC: Primary care physician; Glean Hess, MD   03/24/2021, 10:40 PM

## 2021-03-24 NOTE — ED Notes (Signed)
US at bedside

## 2021-03-24 NOTE — ED Notes (Signed)
Patient transported to CT 

## 2021-03-24 NOTE — ED Notes (Signed)
Patient back to room from X-ray. Provider at bedside.

## 2021-03-25 ENCOUNTER — Inpatient Hospital Stay: Payer: Medicare Other

## 2021-03-25 ENCOUNTER — Encounter: Payer: Self-pay | Admitting: Family Medicine

## 2021-03-25 DIAGNOSIS — E876 Hypokalemia: Secondary | ICD-10-CM

## 2021-03-25 DIAGNOSIS — R932 Abnormal findings on diagnostic imaging of liver and biliary tract: Secondary | ICD-10-CM

## 2021-03-25 DIAGNOSIS — D72829 Elevated white blood cell count, unspecified: Secondary | ICD-10-CM

## 2021-03-25 DIAGNOSIS — R109 Unspecified abdominal pain: Secondary | ICD-10-CM

## 2021-03-25 LAB — COMPREHENSIVE METABOLIC PANEL
ALT: 24 U/L (ref 0–44)
AST: 24 U/L (ref 15–41)
Albumin: 2.5 g/dL — ABNORMAL LOW (ref 3.5–5.0)
Alkaline Phosphatase: 108 U/L (ref 38–126)
Anion gap: 10 (ref 5–15)
BUN: 18 mg/dL (ref 8–23)
CO2: 26 mmol/L (ref 22–32)
Calcium: 8.3 mg/dL — ABNORMAL LOW (ref 8.9–10.3)
Chloride: 91 mmol/L — ABNORMAL LOW (ref 98–111)
Creatinine, Ser: 0.92 mg/dL (ref 0.44–1.00)
GFR, Estimated: >60 mL/min (ref >60)
Glucose, Bld: 133 mg/dL — ABNORMAL HIGH (ref 70–99)
Potassium: 3.4 mmol/L — ABNORMAL LOW (ref 3.5–5.1)
Sodium: 127 mmol/L — ABNORMAL LOW (ref 135–145)
Total Bilirubin: 0.5 mg/dL (ref 0.3–1.2)
Total Protein: 5.7 g/dL — ABNORMAL LOW (ref 6.5–8.1)

## 2021-03-25 LAB — CBC
HCT: 27.5 % — ABNORMAL LOW (ref 36.0–46.0)
Hemoglobin: 9.2 g/dL — ABNORMAL LOW (ref 12.0–15.0)
MCH: 28.9 pg (ref 26.0–34.0)
MCHC: 33.5 g/dL (ref 30.0–36.0)
MCV: 86.5 fL (ref 80.0–100.0)
Platelets: 291 10*3/uL (ref 150–400)
RBC: 3.18 MIL/uL — ABNORMAL LOW (ref 3.87–5.11)
RDW: 13.1 % (ref 11.5–15.5)
WBC: 9.4 10*3/uL (ref 4.0–10.5)
nRBC: 0 % (ref 0.0–0.2)

## 2021-03-25 LAB — TROPONIN I (HIGH SENSITIVITY): Troponin I (High Sensitivity): 16 ng/L (ref <18)

## 2021-03-25 MED ORDER — BISACODYL 5 MG PO TBEC: 5.0000 mg | DELAYED_RELEASE_TABLET | Freq: Every day | ORAL | Status: DC | PRN

## 2021-03-25 MED ORDER — FENTANYL CITRATE (PF) 100 MCG/2ML IJ SOLN
INTRAMUSCULAR | Status: AC | PRN
  Administered 2021-03-25 (×2): 50 ug via INTRAVENOUS

## 2021-03-25 MED ORDER — FENTANYL CITRATE (PF) 100 MCG/2ML IJ SOLN
INTRAMUSCULAR | Status: AC
  Filled 2021-03-25: qty 2

## 2021-03-25 MED ORDER — NITROGLYCERIN 0.4 MG SL SUBL: 0.4000 mg | SUBLINGUAL_TABLET | SUBLINGUAL | Status: DC | PRN

## 2021-03-25 MED ORDER — IPRATROPIUM BROMIDE 0.02 % IN SOLN: 0.5000 mg | Freq: Four times a day (QID) | RESPIRATORY_TRACT | Status: DC | PRN

## 2021-03-25 MED ORDER — TRAZODONE HCL 50 MG PO TABS: 25.0000 mg | ORAL_TABLET | Freq: Every evening | ORAL | Status: DC | PRN

## 2021-03-25 MED ORDER — ACETAMINOPHEN 325 MG PO TABS
650.0000 mg | ORAL_TABLET | Freq: Four times a day (QID) | ORAL | Status: DC | PRN
  Administered 2021-03-25 – 2021-03-26 (×4): 650 mg via ORAL
  Filled 2021-03-25 (×5): qty 2

## 2021-03-25 MED ORDER — MIDAZOLAM HCL 2 MG/2ML IJ SOLN
INTRAMUSCULAR | Status: AC
  Filled 2021-03-25: qty 2

## 2021-03-25 MED ORDER — SENNOSIDES-DOCUSATE SODIUM 8.6-50 MG PO TABS: 1.0000 | ORAL_TABLET | Freq: Every evening | ORAL | Status: DC | PRN

## 2021-03-25 MED ORDER — SODIUM CHLORIDE 0.9 % IV SOLN: INTRAVENOUS | Status: DC

## 2021-03-25 MED ORDER — ONDANSETRON HCL 4 MG PO TABS: 4.0000 mg | ORAL_TABLET | Freq: Four times a day (QID) | ORAL | Status: DC | PRN

## 2021-03-25 MED ORDER — MORPHINE SULFATE (PF) 2 MG/ML IV SOLN: 1.0000 mg | Freq: Four times a day (QID) | INTRAVENOUS | Status: DC | PRN

## 2021-03-25 MED ORDER — CALCIUM CARBONATE 1250 (500 CA) MG PO TABS
500.0000 mg | ORAL_TABLET | Freq: Every day | ORAL | Status: DC
  Filled 2021-03-25: qty 1

## 2021-03-25 MED ORDER — ACETAMINOPHEN 650 MG RE SUPP: 650.0000 mg | Freq: Four times a day (QID) | RECTAL | Status: DC | PRN

## 2021-03-25 MED ORDER — OXYCODONE HCL 5 MG PO TABS
5.0000 mg | ORAL_TABLET | ORAL | Status: DC | PRN
  Administered 2021-03-25 (×2): 5 mg via ORAL
  Filled 2021-03-25 (×3): qty 1

## 2021-03-25 MED ORDER — HEPARIN SODIUM (PORCINE) 5000 UNIT/ML IJ SOLN
5000.0000 [IU] | Freq: Three times a day (TID) | INTRAMUSCULAR | Status: DC
  Administered 2021-03-25 – 2021-03-27 (×7): 5000 [IU] via SUBCUTANEOUS
  Filled 2021-03-25 (×7): qty 1

## 2021-03-25 MED ORDER — HYDRALAZINE HCL 25 MG PO TABS
25.0000 mg | ORAL_TABLET | Freq: Two times a day (BID) | ORAL | Status: DC
  Administered 2021-03-25 – 2021-03-27 (×4): 25 mg via ORAL
  Filled 2021-03-25 (×4): qty 1

## 2021-03-25 MED ORDER — CARBAMAZEPINE 200 MG PO TABS
200.0000 mg | ORAL_TABLET | Freq: Every day | ORAL | Status: DC
  Administered 2021-03-25 – 2021-03-27 (×3): 200 mg via ORAL
  Filled 2021-03-25 (×3): qty 1

## 2021-03-25 MED ORDER — ATORVASTATIN CALCIUM 20 MG PO TABS
40.0000 mg | ORAL_TABLET | Freq: Every day | ORAL | Status: DC
  Administered 2021-03-25 – 2021-03-27 (×3): 40 mg via ORAL
  Filled 2021-03-25 (×3): qty 2

## 2021-03-25 MED ORDER — RAMIPRIL 5 MG PO CAPS
5.0000 mg | ORAL_CAPSULE | Freq: Every day | ORAL | Status: DC
  Administered 2021-03-25 – 2021-03-27 (×3): 5 mg via ORAL
  Filled 2021-03-25 (×3): qty 1

## 2021-03-25 MED ORDER — VITAMIN D 25 MCG (1000 UNIT) PO TABS
1000.0000 [IU] | ORAL_TABLET | Freq: Every day | ORAL | Status: DC
  Administered 2021-03-25 – 2021-03-27 (×3): 1000 [IU] via ORAL
  Filled 2021-03-25 (×3): qty 1

## 2021-03-25 MED ORDER — METOPROLOL SUCCINATE ER 25 MG PO TB24
12.5000 mg | ORAL_TABLET | Freq: Two times a day (BID) | ORAL | Status: DC
  Administered 2021-03-26 – 2021-03-27 (×2): 12.5 mg via ORAL
  Filled 2021-03-25 (×2): qty 1

## 2021-03-25 MED ORDER — ALBUTEROL SULFATE (2.5 MG/3ML) 0.083% IN NEBU: 2.5000 mg | INHALATION_SOLUTION | Freq: Four times a day (QID) | RESPIRATORY_TRACT | Status: DC | PRN

## 2021-03-25 MED ORDER — MAGNESIUM CITRATE PO SOLN
1.0000 | Freq: Once | ORAL | Status: DC | PRN
  Filled 2021-03-25: qty 296

## 2021-03-25 MED ORDER — INSULIN GLARGINE 100 UNIT/ML ~~LOC~~ SOLN
10.0000 [IU] | Freq: Every day | SUBCUTANEOUS | Status: DC
  Administered 2021-03-26 – 2021-03-27 (×2): 10 [IU] via SUBCUTANEOUS
  Filled 2021-03-25 (×4): qty 0.1

## 2021-03-25 MED ORDER — ONDANSETRON HCL 4 MG/2ML IJ SOLN: 4.0000 mg | Freq: Four times a day (QID) | INTRAMUSCULAR | Status: DC | PRN

## 2021-03-25 MED ORDER — MIDAZOLAM HCL 2 MG/2ML IJ SOLN
INTRAMUSCULAR | Status: AC | PRN
  Administered 2021-03-25 (×2): 1 mg via INTRAVENOUS

## 2021-03-25 MED ORDER — SODIUM CHLORIDE 0.9% FLUSH
5.0000 mL | Freq: Three times a day (TID) | INTRAVENOUS | Status: DC
  Administered 2021-03-25 – 2021-03-27 (×6): 5 mL

## 2021-03-25 MED ORDER — HYDROCHLOROTHIAZIDE 12.5 MG PO CAPS: 12.5000 mg | ORAL_CAPSULE | Freq: Every day | ORAL | Status: DC

## 2021-03-25 NOTE — Plan of Care (Signed)

## 2021-03-25 NOTE — Consult Note (Signed)
Chief Complaint: Patient was seen in consultation today for acute calculus cholecystitis  Referring Physician(s): Dr. Dahlia Byes  Supervising Physician: Daryll Brod  Patient Status: [place] - In-pt  History of Present Illness: [name] is a 75 y.o. female with history of colon cancer s/p resection, DM2, HTN, HLD, seizure disorder who presented to [facility] with abdominal pain, poor appetite and confusion. She was found to have hyponatremia with sodium of 122 and concern for cholecystitis. Patient was initiated on fluid/sodium resuscitation, antibiotics and surgery was consulted.  Due to concern for hyponatremia, patient not a surgical candidate at this time. IR consulted for percutaneous cholecystostomy tube placement.   [place] 03/24/21: Gallbladder sludge and cholelithiasis with wall thickening and pericholecystic fluid. A negative sonographic Murphy's sign is elicited however these changes remain suspicious for acute Cholecystitis.  Case reviewed by Dr. Annamaria Boots who approves patient for procedure today.   PA met with patient and daughter at bedside.  Patient remains foggy with poor retention of details and history at this time.  She is understandably concerned about drain placement and asks appropriate questions. Ultimately she understands recommendations of care team and defers to her daughter for decision-making at this time.  She has been NPO.   Past Medical History:  Diagnosis Date  . Allergy   . Arthritis of knee, degenerative 05/08/2015  . Basal cell carcinoma 03/2019   nose  . Colon cancer ([place]) 2014   Partial colon resection, chemo + rad tx's.   . Diabetes mellitus without complication ([place])    Pt takes Metformin.  Marland Kitchen Hyperlipidemia   . Hypertension   . SBO (small bowel obstruction) ([place]) 04/20/2018  . Seizures ([place])   . Umbilical hernia with obstruction but no gangrene   . Vitamin D deficiency     Past Surgical History:  Procedure Laterality Date  . CATARACT EXTRACTION,  BILATERAL  01/2020   Dr. Theresia Lo  . COLOSTOMY REVERSAL  01/2014  . ILEOSTOMY  05/2013  . UMBILICAL HERNIA REPAIR  05/2018    Allergies: Baclofen and Pioglitazone  Medications: Prior to Admission medications   Medication Sig Start Date End Date Taking? Authorizing Provider  aspirin 81 MG chewable tablet Chew 81 mg by mouth daily.    Yes [provider]  atorvastatin (LIPITOR) 40 MG tablet TAKE 1 TABLET BY MOUTH DAILY 02/14/21  Yes Glean Hess, MD  calcium carbonate (OSCAL) 1500 (600 Ca) MG TABS tablet Take 600 mg of elemental calcium by mouth daily.   Yes [provider]  carbamazepine (TEGRETOL) 200 MG tablet TAKE 1 TABLET(200 MG) BY MOUTH DAILY 08/06/20  Yes Glean Hess, MD  Cholecalciferol 25 MCG (1000 UT) capsule Take 1,000 Units by mouth daily.  09/22/14  Yes [provider]  Fluocinolone Acetonide 0.01 % OIL INSTILL 4 DROPS INTO BOTH EARS THREE TIMES DAILY AS NEEDED FOR DRYNESS AND ITCHING 12/31/18  Yes [provider]  hydrALAZINE (APRESOLINE) 25 MG tablet Take 1 tablet (25 mg total) by mouth 2 (two) times daily. 10/02/20  Yes Glean Hess, MD  hydrochlorothiazide (MICROZIDE) 12.5 MG capsule TAKE 1 CAPSULE(12.5 MG) BY MOUTH DAILY 11/03/20  Yes Glean Hess, MD  insulin glargine (LANTUS SOLOSTAR) 100 UNIT/ML Solostar Pen Inject 20-25 Units into the skin daily. Patient taking differently: Inject 18-20 Units into the skin daily. 04/25/20  Yes Glean Hess, MD  meloxicam (MOBIC) 15 MG tablet Take 15 mg by mouth as needed for pain.   Yes [provider]  metFORMIN (  GLUCOPHAGE-XR) 500 MG 24 hr tablet TAKE 2 TABLETS BY MOUTH 2 TIMES DAILY 12/25/20  Yes Glean Hess, MD  metoprolol succinate (TOPROL-XL) 25 MG 24 hr tablet Take 0.5 tablets (12.5 mg total) by mouth in the morning and at bedtime. 08/23/20 08/23/21 Yes Glean Hess, MD  nitroGLYCERIN (NITROSTAT) 0.4 MG SL tablet Place 1 tablet (0.4 mg total) under the tongue  every 5 (five) minutes x 3 doses as needed for chest pain. 07/26/18  Yes Demetrios Loll, MD  [facility] ULTRA test strip USE 1 STRIP TO CHECK GLUCOSE TWICE DAILY 12/17/20  Yes Glean Hess, MD  Propylene Glycol (SYSTANE BALANCE OP) Apply to eye.   Yes [provider]  ramipril (ALTACE) 5 MG capsule TAKE 1 CAPSULE BY MOUTH DAILY 11/12/20  Yes Glean Hess, MD     Family History  Problem Relation Age of Onset  . Cancer Mother 72       unknown type  . Diabetes Daughter   . Diabetes Son     Social History   Socioeconomic History  . Marital status: Widowed    Spouse name: Not on file  . Number of children: 3  . Years of education: Not on file  . Highest education level: Some college, no degree  Occupational History  . Not on file  Tobacco Use  . Smoking status: Never Smoker  . Smokeless tobacco: Never Used  Vaping Use  . Vaping Use: Never used  Substance and Sexual Activity  . Alcohol use: No    Alcohol/week: 0.0 standard drinks  . Drug use: No  . Sexual activity: Not Currently  Other Topics Concern  . Not on file  Social History Narrative   Pt's daughter lives with her   Social Determinants of Health   Financial Resource Strain: Low Risk   . Difficulty of Paying Living Expenses: Not very hard  Food Insecurity: No Food Insecurity  . Worried About Charity fundraiser in the Last Year: Never true  . Ran Out of Food in the Last Year: Never true  Transportation Needs: No Transportation Needs  . Lack of Transportation (Medical): No  . Lack of Transportation (Non-Medical): No  Physical Activity: Inactive  . Days of Exercise per Week: 0 days  . Minutes of Exercise per Session: 0 min  Stress: Stress Concern Present  . Feeling of Stress : To some extent  Social Connections: Moderately Isolated  . Frequency of Communication with Friends and Family: More than three times a week  . Frequency of Social Gatherings with Friends and Family: More than three times a week   . Attends Religious Services: More than 4 times per year  . Active Member of Clubs or Organizations: No  . Attends Archivist Meetings: Never  . Marital Status: Widowed     Review of Systems: A 12 point ROS discussed and pertinent positives are indicated in the HPI above.  All other systems are negative.  Review of Systems  Constitutional: Positive for fatigue. Negative for fever.  Respiratory: Negative for cough and shortness of breath.   Cardiovascular: Positive for chest pain.  Gastrointestinal: Positive for abdominal pain and nausea. Negative for vomiting.  Genitourinary: Negative for dysuria and hematuria.  Musculoskeletal: Negative for back pain.  Psychiatric/Behavioral: Positive for confusion.    Vital Signs: BP (!) 137/50   Pulse 72   Temp 98.5 F (36.9 C)   Resp 18   Ht $R'5\' 2"'Rc$  (1.575 m)   Wt 155  lb 1.6 oz (70.4 kg)   SpO2 98%   BMI 28.37 kg/m   Physical Exam Vitals and nursing note reviewed.  Constitutional:      Appearance: She is well-developed.  Cardiovascular:     Rate and Rhythm: Normal rate and regular rhythm.  Pulmonary:     Effort: Pulmonary effort is normal. No tachypnea.  Skin:    General: Skin is warm and dry.  Neurological:     Mental Status: She is alert. She is disoriented.     Comments: Reports confusion, participates in conversation however asks some questions multiple times.   Psychiatric:        Mood and Affect: Mood normal.        Behavior: Behavior normal.      MD Evaluation Airway: WNL Heart: WNL Abdomen: WNL Chest/ Lungs: WNL ASA  Classification: 3 Mallampati/Airway Score: Two   Imaging: DG Chest 2 View  Result Date: 03/24/2021 CLINICAL DATA:  Chest pain EXAM: CHEST - 2 VIEW COMPARISON:  05/13/2006 FINDINGS: The heart size and mediastinal contours are within normal limits. Both lungs are clear. The visualized skeletal structures are unremarkable. IMPRESSION: No active cardiopulmonary disease. Electronically Signed    By: Ulyses Jarred M.D.   On: 03/24/2021 19:57   CT ABDOMEN PELVIS W CONTRAST  Result Date: 03/24/2021 CLINICAL DATA:  Recent colonoscopy with polyp removal, new onset abdominal pain with decreased appetite EXAM: CT ABDOMEN AND PELVIS WITH CONTRAST TECHNIQUE: Multidetector CT imaging of the abdomen and pelvis was performed using the standard protocol following bolus administration of intravenous contrast. CONTRAST:  129mL OMNIPAQUE IOHEXOL 300 MG/ML  SOLN COMPARISON:  05/22/2018 FINDINGS: Lower chest: No acute abnormality. Hepatobiliary: Liver is within normal limits. Gallbladder is well distended with evidence of a rim calcified gallstone. Wall thickening and mild pericholecystic inflammatory changes noted suggestive of acute cholecystitis. No biliary ductal dilatation is seen. Pancreas: Unremarkable. No pancreatic ductal dilatation or surrounding inflammatory changes. Spleen: Normal in size without focal abnormality. Adrenals/Urinary Tract: Adrenal glands are unremarkable. Kidneys demonstrate a normal enhancement pattern bilaterally. No renal calculi or obstructive changes are seen. Normal excretion is noted on delayed images. Bladder is partially distended. Stomach/Bowel: Postsurgical changes are noted within the colon. No obstructive or inflammatory changes are seen. No findings to suggest perforation related to the recent colonoscopy are noted. The appendix is within normal limits. Small bowel and stomach are unremarkable. Vascular/Lymphatic: Aortic atherosclerosis. No enlarged abdominal or pelvic lymph nodes. Reproductive: Calcified uterine fibroids are noted stable from the prior exam. No adnexal mass is noted. Other: No abdominal wall hernia or abnormality. No abdominopelvic ascites. Musculoskeletal: Degenerative changes of lumbar spine are noted. IMPRESSION: Changes suggestive of acute calculus cholecystitis. Ultrasound may be helpful for further evaluation. Postsurgical changes are seen in the colon.  No acute abnormality related to the recent colonoscopy is seen. Electronically Signed   By: Inez Catalina M.D.   On: 03/24/2021 20:33   US Abdomen Limited RUQ (LIVER/GB)  Result Date: 03/24/2021 CLINICAL DATA:  Right upper quadrant pain EXAM: ULTRASOUND ABDOMEN LIMITED RIGHT UPPER QUADRANT COMPARISON:  CT from earlier in the same day. FINDINGS: Gallbladder: Gallbladder is well distended with cholelithiasis and gallbladder sludge identified. Negative sonographic Murphy's sign is seen although wall thickening to 13 mm is noted with pericholecystic fluid. Common bile duct: Diameter: 3.1 mm. Liver: There is a 1.5 cm hyperechoic lesion better visualized than on the recent CT examination consistent with a focal hepatic hemangioma. No other focal lesion is noted. Liver echogenicity is  otherwise within normal limits. Portal vein is patent on color Doppler imaging with normal direction of blood flow towards the liver. Other: None. IMPRESSION: Gallbladder sludge and cholelithiasis with wall thickening and pericholecystic fluid. A negative sonographic Murphy's sign is elicited however these changes remain suspicious for acute cholecystitis. Hyperechoic lesion in the liver adjacent to the portal vein consistent with hemangioma. Electronically Signed   By: Inez Catalina M.D.   On: 03/24/2021 21:47    Labs:  CBC: Recent Labs    12/25/20 1054 03/24/21 1843 03/25/21 0504  WBC 8.4 12.9* 9.4  HGB 12.0 10.5* 9.2*  HCT 36.1 31.0* 27.5*  PLT 325 349 291    COAGS: No results for input(s): INR, APTT in the last 8760 hours.  BMP: Recent Labs    04/23/20 1116 08/23/20 1122 12/25/20 1054 03/24/21 1843 03/25/21 0504  NA 132* 137 137 122* 127*  K 4.2 4.7 4.8 3.5 3.4*  CL 95* 98 97 85* 91*  CO2 $Re'22 23 22 25 26  'lQI$ GLUCOSE 152* 120* 111* 154* 133*  BUN $Re'13 12 15 20 18  'wst$ CALCIUM 10.2 9.6 9.7 8.5* 8.3*  CREATININE 0.96 1.03* 1.15* 0.96 0.92  GFRNONAA 58* 54* 47* >60 >60  GFRAA 67 62 54*  --   --     LIVER  FUNCTION TESTS: Recent Labs    12/25/20 1054 03/24/21 1955 03/25/21 0504  BILITOT 0.3 0.7 0.5  AST $Re'17 31 24  'fnp$ ALT $R'11 27 24  'vv$ ALKPHOS 138* 139* 108  PROT 6.6 6.4* 5.7*  ALBUMIN 4.4 3.0* 2.5*    TUMOR MARKERS: No results for input(s): AFPTM, CEA, CA199, CHROMGRNA in the last 8760 hours.  Assessment and Plan: Acute calculus cholecystitis Hyponatremia Patient with history of colon cancer s/p resection, ileostomy creation s/p takedown presents with abdominal pain, nausea.  She is found to have acute cholecystitis with gall bladder distention.  Unfortunately she also has hyponatremia and is not a surgical candidate at this time.  IR consulted for percutaneous cholecystostomy tube placement.  Patient assessed at bedside alongside daughter.  She has intermittent confusion with poor retention of details.  They have several questions which are answered by Dr. Dahlia Byes as well as this PA.  All concerns are noted and discussed.  They are agreeable to proceed.  Hyponatremia persists, 122  127 She has been NPO.  Continues IV Zosyn.  Risks and benefits discussed with the patient including, but not limited to bleeding, infection, gallbladder perforation, bile leak, sepsis or even death.  All of the patient's questions were answered, patient is agreeable to proceed. Consent signed and in chart.  Thank you for this interesting consult.  I greatly enjoyed meeting [name] and look forward to participating in their care.  A copy of this report was sent to the requesting provider on this date.  Electronically Signed: Docia Barrier, PA 03/25/2021, 11:04 AM   I spent a total of 40 Minutes    in face to face in clinical consultation, greater than 50% of which was counseling/coordinating care for acute calculus cholecystitis.

## 2021-03-25 NOTE — Progress Notes (Signed)
Patient started on clear liquid diet

## 2021-03-25 NOTE — Procedures (Signed)
Interventional Radiology Procedure Note  Procedure: CT CHOLECYSTOSTOMY IINSERTION    Complications: None  Estimated Blood Loss:  MIN  Findings: BILE ASP. SENT FOR CX    Tamera Punt, MD

## 2021-03-25 NOTE — Progress Notes (Signed)
Patient ID: [name], female   DOB: [birth year], 75 y.o.   MRN: [id number]  PROGRESS NOTE    UNKNOWN FLANNIGAN  NWG:[id number] DOB: 1946-08-27 DOA: 03/24/2021 PCP: Glean Hess, MD   Brief Narrative:  75 year old female with history of colon cancer status postresection, diabetes mellitus type 2, hypertension, hyperlipidemia, small bowel obstructions, seizure disorder presented with decreased appetite, abdominal pain, confusion and chest pain.  She was found to have hyponatremia with sodium of 122 and possible acute cholecystitis on imaging.  She was started on IV fluids, antibiotics and general surgery was consulted.  Assessment & Plan:   Acute calculus cholecystitis -CT abdomen with pelvis and right upper quadrant ultrasound consistent with acute calculus cholecystitis -Continue IV Zosyn and IV fluids -Follow general surgery recommendations: General surgery recommended management with cholecystostomy tube given severe hyponatremia.  Serum sodium improving this morning: will follow further general surgery recommendations  Hypovolemic hyponatremia -Presented with sodium of 122.  Sodium 127 this morning.  Improving.  Increase normal saline to 100 cc an hour -DC hydrochlorothiazide  History of colon cancer status post resection -Outpatient follow-up with oncology  Leukocytosis -Resolved  Hypokalemia -Replace.  Repeat a.m. labs  Normocytic anemia -Questionable cause.  Hemoglobin stable  Diabetes mellitus type 2 -Metformin on hold.  Continue Lantus, decreased dose.  CBGs with SSI  Hypertension -Monitor blood pressure.  DC hydrochlorothiazide.  Continue hydralazine, metoprolol and ramipril  Hyperlipidemia -Continue atorvastatin  History of seizure disorder -Continue Tegretol.  Seizure precautions   DVT prophylaxis: Heparin Code Status: Full Family Communication: None at bedside  disposition Plan: Status is: Inpatient  Remains inpatient appropriate  because:Inpatient level of care appropriate due to severity of illness   Dispo: The patient is from: Home              Anticipated d/c is to: Home              Patient currently is not medically stable to d/c.   Difficult to place patient No  Consultants: General surgery  Procedures: None  Antimicrobials: Zosyn from 03/24/2021 onwards   Subjective: Patient seen and examined at bedside.  Still complains of right upper quadrant pain but feels slightly better.  Also has intermittent nausea.  No overnight fever or worsening shortness of breath reported.  Currently no chest pain.  Objective: Vitals:   03/24/21 2230 03/24/21 2330 03/25/21 0149 03/25/21 0613  BP: (!) 155/63 (!) 147/61 (!) 151/60 (!) 148/62  Pulse: 76 70 73 72  Resp: 18 17 16    Temp:   98.2 F (36.8 C) 98.2 F (36.8 C)  TempSrc:   Oral   SpO2: 94% 100% 100% 100%  Weight:   70.4 kg   Height:   5\' 2"  (1.575 m)     Intake/Output Summary (Last 24 hours) at 03/25/2021 0720 Last data filed at 03/24/2021 2145 Gross per 24 hour  Intake 500 ml  Output --  Net 500 ml   Filed Weights   03/24/21 1840 03/25/21 0149  Weight: 76.2 kg 70.4 kg    Examination:  General exam: Appears calm and comfortable.  Currently on room air Respiratory system: Bilateral decreased breath sounds at bases Cardiovascular system: S1 & S2 heard, intermittently bradycardic Gastrointestinal system: Abdomen is nondistended, soft and tender in the right upper quadrant.  Normal bowel sounds heard. Extremities: No cyanosis, clubbing, edema  Central nervous system: Alert and oriented.  Slow to respond.  Poor historian.  No focal neurological deficits. Moving extremities Skin: No  rashes, lesions or ulcers Psychiatry: Flat affect    Data Reviewed: I have personally reviewed following labs and imaging studies  CBC: Recent Labs  Lab 03/24/21 1843 03/25/21 0504  WBC 12.9* 9.4  HGB 10.5* 9.2*  HCT 31.0* 27.5*  MCV 85.6 86.5  PLT 349 951    Basic Metabolic Panel: Recent Labs  Lab 03/24/21 1843 03/25/21 0504  NA 122* 127*  K 3.5 3.4*  CL 85* 91*  CO2 25 26  GLUCOSE 154* 133*  BUN 20 18  CREATININE 0.96 0.92  CALCIUM 8.5* 8.3*   GFR: Estimated Creatinine Clearance: 48.5 mL/min (by C-G formula based on SCr of 0.92 mg/dL). Liver Function Tests: Recent Labs  Lab 03/24/21 1955 03/25/21 0504  AST 31 24  ALT 27 24  ALKPHOS 139* 108  BILITOT 0.7 0.5  PROT 6.4* 5.7*  ALBUMIN 3.0* 2.5*   No results for input(s): LIPASE, AMYLASE in the last 168 hours. No results for input(s): AMMONIA in the last 168 hours. Coagulation Profile: No results for input(s): INR, PROTIME in the last 168 hours. Cardiac Enzymes: No results for input(s): CKTOTAL, CKMB, CKMBINDEX, TROPONINI in the last 168 hours. BNP (last 3 results) No results for input(s): PROBNP in the last 8760 hours. HbA1C: No results for input(s): HGBA1C in the last 72 hours. CBG: No results for input(s): GLUCAP in the last 168 hours. Lipid Profile: No results for input(s): CHOL, HDL, LDLCALC, TRIG, CHOLHDL, LDLDIRECT in the last 72 hours. Thyroid Function Tests: No results for input(s): TSH, T4TOTAL, FREET4, T3FREE, THYROIDAB in the last 72 hours. Anemia Panel: No results for input(s): VITAMINB12, FOLATE, FERRITIN, TIBC, IRON, RETICCTPCT in the last 72 hours. Sepsis Labs: Recent Labs  Lab 03/24/21 1932  LATICACIDVEN 1.0    Recent Results (from the past 240 hour(s))  Blood culture (routine x 2)     Status: None (Preliminary result)   Collection Time: 03/24/21  9:08 PM   Specimen: BLOOD  Result Value Ref Range Status   Specimen Description BLOOD RIGHT ANTECUBITAL  Final   Special Requests   Final    BOTTLES DRAWN AEROBIC AND ANAEROBIC Blood Culture adequate volume   Culture   Final    NO GROWTH < 12 HOURS Performed at [facility], [address], [place], [place] [address]    Report Status PENDING  Incomplete  Resp Panel by RT-PCR (Flu A&B,  Covid) Nasopharyngeal Swab     Status: None   Collection Time: 03/24/21  9:08 PM   Specimen: Nasopharyngeal Swab; Nasopharyngeal(NP) swabs in vial transport medium  Result Value Ref Range Status   SARS Coronavirus 2 by RT PCR NEGATIVE NEGATIVE Final    Comment: (NOTE) SARS-CoV-2 target nucleic acids are NOT DETECTED.  The SARS-CoV-2 RNA is generally detectable in upper respiratory specimens during the acute phase of infection. The lowest concentration of SARS-CoV-2 viral copies this assay can detect is 138 copies/mL. A negative result does not preclude SARS-Cov-2 infection and should not be used as the sole basis for treatment or other patient management decisions. A negative result may occur with  improper specimen collection/handling, submission of specimen other than nasopharyngeal swab, presence of viral mutation(s) within the areas targeted by this assay, and inadequate number of viral copies(<138 copies/mL). A negative result must be combined with clinical observations, patient history, and epidemiological information. The expected result is Negative.  Fact Sheet for Patients:  EntrepreneurPulse.com.au  Fact Sheet for Healthcare Providers:  IncredibleEmployment.be  This test is no t yet approved or  cleared by the [place] and  has been authorized for detection and/or diagnosis of SARS-CoV-2 by FDA under an Emergency Use Authorization (EUA). This EUA will remain  in effect (meaning this test can be used) for the duration of the COVID-19 declaration under Section 564(b)(1) of the Act, 21 U.S.C.section 360bbb-3(b)(1), unless the authorization is terminated  or revoked sooner.       Influenza A by PCR NEGATIVE NEGATIVE Final   Influenza B by PCR NEGATIVE NEGATIVE Final    Comment: (NOTE) The Xpert Xpress SARS-CoV-2/FLU/RSV plus assay is intended as an aid in the diagnosis of influenza from Nasopharyngeal swab specimens and should  not be used as a sole basis for treatment. Nasal washings and aspirates are unacceptable for Xpert Xpress SARS-CoV-2/FLU/RSV testing.  Fact Sheet for Patients: EntrepreneurPulse.com.au  Fact Sheet for Healthcare Providers: IncredibleEmployment.be  This test is not yet approved or cleared by the [place] FDA and has been authorized for detection and/or diagnosis of SARS-CoV-2 by FDA under an Emergency Use Authorization (EUA). This EUA will remain in effect (meaning this test can be used) for the duration of the COVID-19 declaration under Section 564(b)(1) of the Act, 21 U.S.C. section 360bbb-3(b)(1), unless the authorization is terminated or revoked.  Performed at [facility], [address] [place], [place] [address]   Blood culture (routine x 2)     Status: None (Preliminary result)   Collection Time: 03/24/21  9:09 PM   Specimen: BLOOD  Result Value Ref Range Status   Specimen Description BLOOD BLOOD LEFT WRIST  Final   Special Requests   Final    BOTTLES DRAWN AEROBIC AND ANAEROBIC Blood Culture results may not be optimal due to an inadequate volume of blood received in culture bottles   Culture   Final    NO GROWTH < 12 HOURS Performed at [facility], [address], [place], [place] [address]    Report Status PENDING  Incomplete         Radiology Studies: DG Chest 2 View  Result Date: 03/24/2021 CLINICAL DATA:  Chest pain EXAM: CHEST - 2 VIEW COMPARISON:  05/13/2006 FINDINGS: The heart size and mediastinal contours are within normal limits. Both lungs are clear. The visualized skeletal structures are unremarkable. IMPRESSION: No active cardiopulmonary disease. Electronically Signed   By: Ulyses Jarred M.D.   On: 03/24/2021 19:57   CT ABDOMEN PELVIS W CONTRAST  Result Date: 03/24/2021 CLINICAL DATA:  Recent colonoscopy with polyp removal, new onset abdominal pain with decreased appetite EXAM: CT ABDOMEN AND  PELVIS WITH CONTRAST TECHNIQUE: Multidetector CT imaging of the abdomen and pelvis was performed using the standard protocol following bolus administration of intravenous contrast. CONTRAST:  139mL OMNIPAQUE IOHEXOL 300 MG/ML  SOLN COMPARISON:  05/22/2018 FINDINGS: Lower chest: No acute abnormality. Hepatobiliary: Liver is within normal limits. Gallbladder is well distended with evidence of a rim calcified gallstone. Wall thickening and mild pericholecystic inflammatory changes noted suggestive of acute cholecystitis. No biliary ductal dilatation is seen. Pancreas: Unremarkable. No pancreatic ductal dilatation or surrounding inflammatory changes. Spleen: Normal in size without focal abnormality. Adrenals/Urinary Tract: Adrenal glands are unremarkable. Kidneys demonstrate a normal enhancement pattern bilaterally. No renal calculi or obstructive changes are seen. Normal excretion is noted on delayed images. Bladder is partially distended. Stomach/Bowel: Postsurgical changes are noted within the colon. No obstructive or inflammatory changes are seen. No findings to suggest perforation related to the recent colonoscopy are noted. The appendix is within normal limits. Small bowel  and stomach are unremarkable. Vascular/Lymphatic: Aortic atherosclerosis. No enlarged abdominal or pelvic lymph nodes. Reproductive: Calcified uterine fibroids are noted stable from the prior exam. No adnexal mass is noted. Other: No abdominal wall hernia or abnormality. No abdominopelvic ascites. Musculoskeletal: Degenerative changes of lumbar spine are noted. IMPRESSION: Changes suggestive of acute calculus cholecystitis. Ultrasound may be helpful for further evaluation. Postsurgical changes are seen in the colon. No acute abnormality related to the recent colonoscopy is seen. Electronically Signed   By: Inez Catalina M.D.   On: 03/24/2021 20:33   US Abdomen Limited RUQ (LIVER/GB)  Result Date: 03/24/2021 CLINICAL DATA:  Right upper quadrant  pain EXAM: ULTRASOUND ABDOMEN LIMITED RIGHT UPPER QUADRANT COMPARISON:  CT from earlier in the same day. FINDINGS: Gallbladder: Gallbladder is well distended with cholelithiasis and gallbladder sludge identified. Negative sonographic Murphy's sign is seen although wall thickening to 13 mm is noted with pericholecystic fluid. Common bile duct: Diameter: 3.1 mm. Liver: There is a 1.5 cm hyperechoic lesion better visualized than on the recent CT examination consistent with a focal hepatic hemangioma. No other focal lesion is noted. Liver echogenicity is otherwise within normal limits. Portal vein is patent on color Doppler imaging with normal direction of blood flow towards the liver. Other: None. IMPRESSION: Gallbladder sludge and cholelithiasis with wall thickening and pericholecystic fluid. A negative sonographic Murphy's sign is elicited however these changes remain suspicious for acute cholecystitis. Hyperechoic lesion in the liver adjacent to the portal vein consistent with hemangioma. Electronically Signed   By: Inez Catalina M.D.   On: 03/24/2021 21:47        Scheduled Meds: . atorvastatin  40 mg Oral Daily  . calcium carbonate  500 mg of elemental calcium Oral Q breakfast  . carbamazepine  200 mg Oral Daily  . cholecalciferol  1,000 Units Oral Daily  . heparin  5,000 Units Subcutaneous Q8H  . hydrALAZINE  25 mg Oral BID  . hydrochlorothiazide  12.5 mg Oral Daily  . insulin glargine  10 Units Subcutaneous Daily  . [START ON 03/26/2021] metoprolol succinate  12.5 mg Oral BID  . ramipril  5 mg Oral Daily   Continuous Infusions: . sodium chloride 75 mL/hr at 03/25/21 0145  . piperacillin-tazobactam (ZOSYN)  IV 3.375 g (03/25/21 0523)          Aline August, MD [facility] 03/25/2021, 7:20 AM

## 2021-03-25 NOTE — Consult Note (Signed)
[facility] SURGICAL CONSULTATION NOTE (initial) - cpt: 14431   HISTORY OF PRESENT ILLNESS (HPI):  75 y.o. female presented to [facility] ED yesterday for evaluation of AMS. Patient is somewhat confused and history is difficult to obtain without family at bedside. Per chart review, she had been reporting increase in confusion, decreasing appetite, nausea, and overall weakness. She denied any specific pain but felt a "tightness" in her lower chest and some abdominal bloating. No recorded fever, chills, cough, emesis, urinary changes, or bowel changes. She is unsure what brought this on but she feels that she "hasn't been the same since her colonoscopy on 04/29" Nothing seemed to make her symptoms better. She does have a significant intra-abdominal surgical history including colectomy/colostomy/ileostomy and reversal for colon cancer. Work up in the ED was concerning for hyponatremia to 122 (now 127), leukocytosis to 12.9K (resolved today at 9.4K), and labs were otherwise reassuring., She did have CT Abdomen/Pelvis which showed large gallstone in the GB neck with pericholecystic changes which were again confirmed on [place]. She was admitted to the medicine service.   Surgery is consulted by emergency medicine physician Dr. Duffy Bruce, MD in this context for evaluation and management of cholecystitis.  PAST MEDICAL HISTORY (PMH):  Past Medical History:  Diagnosis Date  . Allergy   . Arthritis of knee, degenerative 05/08/2015  . Basal cell carcinoma 03/2019   nose  . Colon cancer ([place]) 2014   Partial colon resection, chemo + rad tx's.   . Diabetes mellitus without complication ([place])    Pt takes Metformin.  Marland Kitchen Hyperlipidemia   . Hypertension   . SBO (small bowel obstruction) ([place]) 04/20/2018  . Seizures ([place])   . Umbilical hernia with obstruction but no gangrene   . Vitamin D deficiency      PAST SURGICAL HISTORY ([place]):  Past Surgical History:  Procedure Laterality Date  . CATARACT  EXTRACTION, BILATERAL  01/2020   Dr. Theresia Lo  . COLOSTOMY REVERSAL  01/2014  . ILEOSTOMY  05/2013  . UMBILICAL HERNIA REPAIR  05/2018     MEDICATIONS:  Prior to Admission medications   Medication Sig Start Date End Date Taking? Authorizing Provider  aspirin 81 MG chewable tablet Chew 81 mg by mouth daily.    Yes [provider]  atorvastatin (LIPITOR) 40 MG tablet TAKE 1 TABLET BY MOUTH DAILY 02/14/21  Yes Glean Hess, MD  calcium carbonate (OSCAL) 1500 (600 Ca) MG TABS tablet Take 600 mg of elemental calcium by mouth daily.   Yes [provider]  carbamazepine (TEGRETOL) 200 MG tablet TAKE 1 TABLET(200 MG) BY MOUTH DAILY 08/06/20  Yes Glean Hess, MD  Cholecalciferol 25 MCG (1000 UT) capsule Take 1,000 Units by mouth daily.  09/22/14  Yes [provider]  Fluocinolone Acetonide 0.01 % OIL INSTILL 4 DROPS INTO BOTH EARS THREE TIMES DAILY AS NEEDED FOR DRYNESS AND ITCHING 12/31/18  Yes [provider]  hydrALAZINE (APRESOLINE) 25 MG tablet Take 1 tablet (25 mg total) by mouth 2 (two) times daily. 10/02/20  Yes Glean Hess, MD  hydrochlorothiazide (MICROZIDE) 12.5 MG capsule TAKE 1 CAPSULE(12.5 MG) BY MOUTH DAILY 11/03/20  Yes Glean Hess, MD  insulin glargine (LANTUS SOLOSTAR) 100 UNIT/ML Solostar Pen Inject 20-25 Units into the skin daily. Patient taking differently: Inject 18-20 Units into the skin daily. 04/25/20  Yes Glean Hess, MD  meloxicam (MOBIC) 15 MG tablet Take 15 mg by mouth as needed for pain.   Yes [provider]  metFORMIN (GLUCOPHAGE-XR) 500 MG 24 hr tablet TAKE 2 TABLETS BY MOUTH 2 TIMES DAILY 12/25/20  Yes Glean Hess, MD  metoprolol succinate (TOPROL-XL) 25 MG 24 hr tablet Take 0.5 tablets (12.5 mg total) by mouth in the morning and at bedtime. 08/23/20 08/23/21 Yes Glean Hess, MD  nitroGLYCERIN (NITROSTAT) 0.4 MG SL tablet Place 1 tablet (0.4 mg total) under the tongue every 5 (five) minutes x 3  doses as needed for chest pain. 07/26/18  Yes Demetrios Loll, MD  [facility] ULTRA test strip USE 1 STRIP TO CHECK GLUCOSE TWICE DAILY 12/17/20  Yes Glean Hess, MD  Propylene Glycol (SYSTANE BALANCE OP) Apply to eye.   Yes [provider]  ramipril (ALTACE) 5 MG capsule TAKE 1 CAPSULE BY MOUTH DAILY 11/12/20  Yes Glean Hess, MD     ALLERGIES:  Allergies  Allergen Reactions  . Baclofen Other (See Comments)  . Pioglitazone Nausea Only     SOCIAL HISTORY:  Social History   Socioeconomic History  . Marital status: Widowed    Spouse name: Not on file  . Number of children: 3  . Years of education: Not on file  . Highest education level: Some college, no degree  Occupational History  . Not on file  Tobacco Use  . Smoking status: Never Smoker  . Smokeless tobacco: Never Used  Vaping Use  . Vaping Use: Never used  Substance and Sexual Activity  . Alcohol use: No    Alcohol/week: 0.0 standard drinks  . Drug use: No  . Sexual activity: Not Currently  Other Topics Concern  . Not on file  Social History Narrative   Pt's daughter lives with her   Social Determinants of Health   Financial Resource Strain: Low Risk   . Difficulty of Paying Living Expenses: Not very hard  Food Insecurity: No Food Insecurity  . Worried About Charity fundraiser in the Last Year: Never true  . Ran Out of Food in the Last Year: Never true  Transportation Needs: No Transportation Needs  . Lack of Transportation (Medical): No  . Lack of Transportation (Non-Medical): No  Physical Activity: Inactive  . Days of Exercise per Week: 0 days  . Minutes of Exercise per Session: 0 min  Stress: Stress Concern Present  . Feeling of Stress : To some extent  Social Connections: Moderately Isolated  . Frequency of Communication with Friends and Family: More than three times a week  . Frequency of Social Gatherings with Friends and Family: More than three times a week  . Attends Religious Services:  More than 4 times per year  . Active Member of Clubs or Organizations: No  . Attends Archivist Meetings: Never  . Marital Status: Widowed  Intimate Partner Violence: Not At Risk  . Fear of Current or Ex-Partner: No  . Emotionally Abused: No  . Physically Abused: No  . Sexually Abused: No     FAMILY HISTORY:  Family History  Problem Relation Age of Onset  . Cancer Mother 74       unknown type  . Diabetes Daughter   . Diabetes Son       REVIEW OF SYSTEMS:  Review of Systems  Constitutional: Positive for malaise/fatigue. Negative for chills and fever.  HENT: Negative for congestion and sore throat.   Respiratory: Negative for cough and shortness of breath.   Cardiovascular: Positive for chest pain (Tightness). Negative for palpitations.  Gastrointestinal: Positive for nausea.  Negative for abdominal pain, constipation, diarrhea and vomiting.  Genitourinary: Negative for dysuria and urgency.  Neurological: Positive for weakness (Generalized).  All other systems reviewed and are negative.   VITAL SIGNS:  Temp:  [98.2 F (36.8 C)-98.3 F (36.8 C)] 98.2 F (36.8 C) (05/09 [id number]) Pulse Rate:  [70-82] 72 (05/09 0613) Resp:  [16-18] 16 (05/09 0149) BP: (147-170)/(60-95) 148/62 (05/09 0613) SpO2:  [94 %-100 %] 100 % (05/09 [id number]) Weight:  [70.4 kg-76.2 kg] 70.4 kg (05/09 0149)     Height: 5\' 2"  (157.5 cm) Weight: 70.4 kg BMI (Calculated): 28.36   INTAKE/OUTPUT:  05/08 0701 - 05/09 0700 In: 500 [IV Piggyback:500] Out: -   PHYSICAL EXAM:  Physical Exam Vitals and nursing note reviewed.  Constitutional:      General: She is not in acute distress.    Appearance: She is well-developed. She is not ill-appearing.     Comments: Patient alert, confused but able to answer questions appropriately  HENT:     Head: Normocephalic and atraumatic.  Eyes:     General:        Right eye: No discharge.     Pupils: Pupils are equal, round, and reactive to light.  Cardiovascular:      Rate and Rhythm: Normal rate.     Pulses: Normal pulses.  Pulmonary:     Effort: Pulmonary effort is normal. No respiratory distress.  Abdominal:     General: A surgical scar is present. There is no distension.     Palpations: Abdomen is soft.     Tenderness: There is no abdominal tenderness. There is no guarding or rebound. Negative signs include Murphy's sign.     Comments: Abdomen is soft, she does not have any appreciable tenderness, non-distended, no rebound/guarding, negative Murphy's Sign   Genitourinary:    Comments: Deferred Musculoskeletal:     Right lower leg: No edema.     Left lower leg: No edema.  Skin:    General: Skin is warm and dry.     Coloration: Skin is not pale.     Findings: No erythema.  Neurological:     General: No focal deficit present.     Mental Status: She is alert. She is disoriented.  Psychiatric:        Mood and Affect: Mood normal.        Behavior: Behavior normal.      Labs:  CBC Latest Ref Rng & Units 03/25/2021 03/24/2021 12/25/2020  WBC 4.0 - 10.5 K/uL 9.4 12.9(H) 8.4  Hemoglobin 12.0 - 15.0 g/dL 9.2(L) 10.5(L) 12.0  Hematocrit 36.0 - 46.0 % 27.5(L) 31.0(L) 36.1  Platelets 150 - 400 K/uL 291 349 325   CMP Latest Ref Rng & Units 03/25/2021 03/24/2021 12/25/2020  Glucose 70 - 99 mg/dL 133(H) 154(H) 111(H)  BUN 8 - 23 mg/dL 18 20 15   Creatinine 0.44 - 1.00 mg/dL 0.92 0.96 1.15(H)  Sodium 135 - 145 mmol/L 127(L) 122(L) 137  Potassium 3.5 - 5.1 mmol/L 3.4(L) 3.5 4.8  Chloride 98 - 111 mmol/L 91(L) 85(L) 97  CO2 22 - 32 mmol/L 26 25 22   Calcium 8.9 - 10.3 mg/dL 8.3(L) 8.5(L) 9.7  Total Protein 6.5 - 8.1 g/dL 5.7(L) 6.4(L) 6.6  Total Bilirubin 0.3 - 1.2 mg/dL 0.5 0.7 0.3  Alkaline Phos 38 - 126 U/L 108 139(H) 138(H)  AST 15 - 41 U/L 24 31 17   ALT 0 - 44 U/L 24 27 11      Imaging studies:   CT Abdomen/Pelvis (  03/24/2021) personally reviewed which shows large gallstone in the GB neck with pericholecystic edema and fluid concerning of acute  cholecystitis, and radiologist report reviewed:  IMPRESSION: Changes suggestive of acute calculus cholecystitis. Ultrasound may be helpful for further evaluation.  Postsurgical changes are seen in the colon. No acute abnormality related to the recent colonoscopy is seen.   RUQ [place] (03/24/2021) personally reviewed again with large gallstone and gallbladder changes concerning for cholecystitis, and radiologist report reviewed:  IMPRESSION: Gallbladder sludge and cholelithiasis with wall thickening and pericholecystic fluid. A negative sonographic Murphy's sign is elicited however these changes remain suspicious for acute cholecystitis.  Hyperechoic lesion in the liver adjacent to the portal vein consistent with hemangioma.    Assessment/Plan: (ICD-10's: K81.0) 75 y.o. female with, now resolved, leukocytosis, found to have large gallstone in neck of gallbladder with peri-cholecystitic edema/fluid/inflammation concerning for acute cholecystitis, complicated by pertinent comorbidities including deconditioning, improving hyponatremia.   - Given the degree of gallbladder edema and inflammation concomitant with her recent deconditioning an, confusion, and hyponatremia, I do not feel that she is an optimal surgical candidate and gallbladder changes on CT would likely make laparoscopic intervention extremely difficult. I do feel that she is best suited for percutaneous cholecystostomy and interval cholecystectomy as outpatient if able to optimize.    - NPO for now + IVF support  - IV Abx (Zosyn)  - Monitor abdominal examination  - Correct electrolytes  - Pain control prn; antiemetics prn  - Mobilization as toelrated   - Further management per primary service; we will follow   All of the above findings and recommendations were discussed with the patient, and all of patient's questions were answered to her expressed satisfaction.  Thank you for the opportunity to participate in this  patient's care.   -- Edison Simon, PA-C [facility] 03/25/2021, 7:39 AM [phone] M-F: 7am - 4pm

## 2021-03-25 NOTE — Progress Notes (Signed)
Patient clinically stable post Cholecystostomy tube placement per Dr Annamaria Boots, draining dk thick bile/brown drainage into bag. Dressing dry/intact,awake/alert post procedure. Report given to Patricia/RN on 2C with questions answered. Received Versed 2 mg along with Fentanyl 100 mcg IV for procedure. Update given to patient's daughter at bedside. Vitals stable pre and post procedure.

## 2021-03-26 DIAGNOSIS — Z978 Presence of other specified devices: Secondary | ICD-10-CM

## 2021-03-26 LAB — MAGNESIUM: Magnesium: 1.9 mg/dL (ref 1.7–2.4)

## 2021-03-26 LAB — COMPREHENSIVE METABOLIC PANEL
ALT: 24 U/L (ref 0–44)
AST: 26 U/L (ref 15–41)
Albumin: 2.7 g/dL — ABNORMAL LOW (ref 3.5–5.0)
Alkaline Phosphatase: 120 U/L (ref 38–126)
Anion gap: 11 (ref 5–15)
BUN: 15 mg/dL (ref 8–23)
CO2: 26 mmol/L (ref 22–32)
Calcium: 8.5 mg/dL — ABNORMAL LOW (ref 8.9–10.3)
Chloride: 97 mmol/L — ABNORMAL LOW (ref 98–111)
Creatinine, Ser: 1.28 mg/dL — ABNORMAL HIGH (ref 0.44–1.00)
GFR, Estimated: 44 mL/min — ABNORMAL LOW (ref >60)
Glucose, Bld: 131 mg/dL — ABNORMAL HIGH (ref 70–99)
Potassium: 3.7 mmol/L (ref 3.5–5.1)
Sodium: 134 mmol/L — ABNORMAL LOW (ref 135–145)
Total Bilirubin: 0.6 mg/dL (ref 0.3–1.2)
Total Protein: 5.9 g/dL — ABNORMAL LOW (ref 6.5–8.1)

## 2021-03-26 LAB — CBC WITH DIFFERENTIAL/PLATELET
Abs Immature Granulocytes: 0.11 10*3/uL — ABNORMAL HIGH (ref 0.00–0.07)
Basophils Absolute: 0 10*3/uL (ref 0.0–0.1)
Basophils Relative: 0 %
Eosinophils Absolute: 0.1 10*3/uL (ref 0.0–0.5)
Eosinophils Relative: 1 %
HCT: 30.1 % — ABNORMAL LOW (ref 36.0–46.0)
Hemoglobin: 9.8 g/dL — ABNORMAL LOW (ref 12.0–15.0)
Immature Granulocytes: 1 %
Lymphocytes Relative: 8 %
Lymphs Abs: 0.8 10*3/uL (ref 0.7–4.0)
MCH: 28.8 pg (ref 26.0–34.0)
MCHC: 32.6 g/dL (ref 30.0–36.0)
MCV: 88.5 fL (ref 80.0–100.0)
Monocytes Absolute: 1 10*3/uL (ref 0.1–1.0)
Monocytes Relative: 11 %
Neutro Abs: 7.2 10*3/uL (ref 1.7–7.7)
Neutrophils Relative %: 79 %
Platelets: 347 10*3/uL (ref 150–400)
RBC: 3.4 MIL/uL — ABNORMAL LOW (ref 3.87–5.11)
RDW: 13.4 % (ref 11.5–15.5)
WBC: 9.3 10*3/uL (ref 4.0–10.5)
nRBC: 0 % (ref 0.0–0.2)

## 2021-03-26 NOTE — Progress Notes (Signed)
[facility] SURGICAL PROGRESS NOTE (cpt [id number])  Hospital Day(s): 2.   Interval History: Patient seen and examined, no acute events or new complaints overnight. Patient reports she continues to be in a fog but this has been persistent since her colonoscopy. She denies fever, chills, nausea, emesis, nor abdominal pain. She remains without leukocytosis. Slight bump in sCr this morning to 1.28; UO - 1.6L. Hyponatremia continues to improve, now 134. No other significant electrolyte derangements. She did undergo percutaneous cholecystostomy tube placement yesterday (05/09). Cx from this are without growth. Output has been 450 ccs; bilious. She continues on Zosyn. She was started on CLD and tolerated well. She is very anxious to go home.   Review of Systems:  Constitutional: denies fever, chills  HEENT: denies cough or congestion  Respiratory: denies any shortness of breath  Cardiovascular: denies chest pain or palpitations  Gastrointestinal: denies abdominal pain, N/V, or diarrhea/and bowel function as per interval history Genitourinary: denies burning with urination or urinary frequency  Vital signs in last 24 hours: [min-max] current  Temp:  [97.9 F (36.6 C)-98.5 F (36.9 C)] 98 F (36.7 C) (05/10 0508) Pulse Rate:  [51-101] 101 (05/10 0508) Resp:  [16-19] 16 (05/10 0508) BP: (96-163)/(36-90) 145/58 (05/10 0508) SpO2:  [94 %-100 %] 95 % (05/10 0508)     Height: 5\' 2"  (157.5 cm) Weight: 70.4 kg BMI (Calculated): 28.36   Intake/Output last 2 shifts:  05/09 0701 - 05/10 0700 In: 1672.3 [I.V.:1522.3; IV Piggyback:149.9] Out: 2150 [Urine:1600; Drains:450]   Physical Exam:  Constitutional: alert, cooperative and no distress  HENT: normocephalic without obvious abnormality  Eyes: PERRL, EOM's grossly intact and symmetric  Respiratory: breathing non-labored at rest  Cardiovascular: regular rate and sinus rhythm  Gastrointestinal: Soft, non-tender, non-distended, no  rebound/guarding, cholecystostomy tube in place with bilious output, site CDI Musculoskeletal: no edema or wounds, motor and sensation grossly intact, NT    Labs:  CBC Latest Ref Rng & Units 03/26/2021 03/25/2021 03/24/2021  WBC 4.0 - 10.5 K/uL 9.3 9.4 12.9(H)  Hemoglobin 12.0 - 15.0 g/dL 9.8(L) 9.2(L) 10.5(L)  Hematocrit 36.0 - 46.0 % 30.1(L) 27.5(L) 31.0(L)  Platelets 150 - 400 K/uL 347 291 349   CMP Latest Ref Rng & Units 03/26/2021 03/25/2021 03/24/2021  Glucose 70 - 99 mg/dL 131(H) 133(H) 154(H)  BUN 8 - 23 mg/dL 15 18 20   Creatinine 0.44 - 1.00 mg/dL 1.28(H) 0.92 0.96  Sodium 135 - 145 mmol/L 134(L) 127(L) 122(L)  Potassium 3.5 - 5.1 mmol/L 3.7 3.4(L) 3.5  Chloride 98 - 111 mmol/L 97(L) 91(L) 85(L)  CO2 22 - 32 mmol/L 26 26 25   Calcium 8.9 - 10.3 mg/dL 8.5(L) 8.3(L) 8.5(L)  Total Protein 6.5 - 8.1 g/dL 5.9(L) 5.7(L) 6.4(L)  Total Bilirubin 0.3 - 1.2 mg/dL 0.6 0.5 0.7  Alkaline Phos 38 - 126 U/L 120 108 139(H)  AST 15 - 41 U/L 26 24 31   ALT 0 - 44 U/L 24 24 27      Imaging studies: No new pertinent imaging studies   Assessment/Plan: (ICD-10's: K81.0) 75 y.o. female found to have large gallstone in neck of gallbladder with peri-cholecystitic edema/fluid/inflammation concerning for acute cholecystitis s/p percutaneous cholecystotomy tube placement on 67/89, complicated by pertinent comorbidities including deconditioning, improving hyponatremia.   - Appreciate IR assistance. Maintain percutaneous cholecystostomy tube for at least 6-8 weeks. Flush daily with 5-10 ccs of NS. Monitor and record output, I will provide instructions  - Okay to advance diet as tolerated   - Continue IV Abx (  Zosyn); recommend PO Augmentin x10 days at discharge    - Monitor abdominal examination             - Correct electrolytes             - Pain control prn; antiemetics prn             - Mobilization as toelrated              - Further management per primary service   - Discharge Planning: General surgery  will sign off, continue drain as above, ABx recommendations as above. She can follow up in surgery clinic in 3-4 weeks for interval cholecystectomy planning.    All of the above findings and recommendations were discussed with the patient, and the medical team, and all of patient's questions were answered to her expressed satisfaction.  -- Edison Simon, PA-C [facility] 03/26/2021, 7:09 AM [phone] M-F: 7am - 4pm

## 2021-03-26 NOTE — Progress Notes (Signed)
Patient ID: [name], female   DOB: 1946-09-10, 75 y.o.   MRN: [id number]  PROGRESS NOTE    JAZLYNNE MILLINER  FYB:[id number] DOB: 02-10-1946 DOA: 03/24/2021 PCP: Glean Hess, MD   Brief Narrative:  75 year old female with history of colon cancer status postresection, diabetes mellitus type 2, hypertension, hyperlipidemia, small bowel obstructions, seizure disorder presented with decreased appetite, abdominal pain, confusion and chest pain.  She was found to have hyponatremia with sodium of 122 and possible acute cholecystitis on imaging.  She was started on IV fluids, antibiotics and general surgery was consulted.  She underwent CT cholecystostomy by IR on 03/25/2021.  Assessment & Plan:   Acute calculus cholecystitis -CT abdomen with pelvis and right upper quadrant ultrasound consistent with acute calculus cholecystitis -Continue IV Zosyn and IV fluids.  Cultures negative so far. -underwent CT cholecystostomy by IR on 03/25/2021 -General surgery following.  Diet advancement as per general surgery.  Hypovolemic hyponatremia -Presented with sodium of 122.  Sodium 134 this morning.  Decrease normal saline to 75 cc an hour. -hydrochlorothiazide discontinued on 03/25/2021  History of colon cancer status post resection -Outpatient follow-up with oncology  Leukocytosis -Resolved  Hypokalemia -Replace.  Repeat a.m. labs  Normocytic anemia -Questionable cause.  Hemoglobin stable  Diabetes mellitus type 2 -Metformin on hold.  Continue Lantus, decreased dose.  CBGs with SSI  Hypertension -Monitor blood pressure. Continue hydralazine, metoprolol and ramipril  Hyperlipidemia -Continue atorvastatin  History of seizure disorder -Continue Tegretol.  Seizure precautions   DVT prophylaxis: Heparin Code Status: Full Family Communication: None at bedside  disposition Plan: Status is: Inpatient  Remains inpatient appropriate because:Inpatient level of care appropriate due to  severity of illness   Dispo: The patient is from: Home              Anticipated d/c is to: Home tomorrow if tolerates advance diet and remains hemodynamically stable              Patient currently is not medically stable to d/c.   Difficult to place patient No  Consultants: General surgery/IR  Procedures: CT cholecystostomy by IR on 03/25/2021  Antimicrobials: Zosyn from 03/24/2021 onwards   Subjective: Patient seen and examined at bedside.  Denies any overnight fever, vomiting, worsening shortness of breath.  Still complains of mild intermittent upper abdominal pain. Objective: Vitals:   03/25/21 1619 03/25/21 2101 03/25/21 2316 03/26/21 0508  BP: (!) 96/41 (!) 125/47 (!) 117/41 (!) 145/58  Pulse: (!) 57 68 (!) 59 (!) 101  Resp: 18 16 16 16   Temp:  97.9 F (36.6 C) 98.1 F (36.7 C) 98 F (36.7 C)  TempSrc:  Oral Oral   SpO2: 98% 99% 96% 95%  Weight:      Height:        Intake/Output Summary (Last 24 hours) at 03/26/2021 0719 Last data filed at 03/26/2021 0500 Gross per 24 hour  Intake 1672.25 ml  Output 2150 ml  Net -477.75 ml   Filed Weights   03/24/21 1840 03/25/21 0149  Weight: 76.2 kg 70.4 kg    Examination:  General exam: On room air currently.  No acute distress.   Respiratory system: Decreased breath sounds at bases bilaterally with some scattered crackles  cardiovascular system: Intermittent bradycardia and tachycardia; S1-S2 heard gastrointestinal system: Abdomen is slightly distended, soft and mildly tender in the right upper quadrant.  Bowel sounds are heard.  Cholecystostomy tube in place with bilious drainage Extremities: Mild lower extremity edema; no clubbing Central  nervous system: Awake and alert.  Still slow to respond.  No focal neurological deficits.  Moves extremities Skin: No obvious ecchymosis/lesions  psychiatry: Affect is flat    Data Reviewed: I have personally reviewed following labs and imaging studies  CBC: Recent Labs  Lab  03/24/21 1843 03/25/21 0504 03/26/21 0241  WBC 12.9* 9.4 9.3  NEUTROABS  --   --  7.2  HGB 10.5* 9.2* 9.8*  HCT 31.0* 27.5* 30.1*  MCV 85.6 86.5 88.5  PLT 349 291 229   Basic Metabolic Panel: Recent Labs  Lab 03/24/21 1843 03/25/21 0504 03/26/21 0241  NA 122* 127* 134*  K 3.5 3.4* 3.7  CL 85* 91* 97*  CO2 25 26 26   GLUCOSE 154* 133* 131*  BUN 20 18 15   CREATININE 0.96 0.92 1.28*  CALCIUM 8.5* 8.3* 8.5*  MG  --   --  1.9   GFR: Estimated Creatinine Clearance: 34.9 mL/min (A) (by C-G formula based on SCr of 1.28 mg/dL (H)). Liver Function Tests: Recent Labs  Lab 03/24/21 1955 03/25/21 0504 03/26/21 0241  AST 31 24 26   ALT 27 24 24   ALKPHOS 139* 108 120  BILITOT 0.7 0.5 0.6  PROT 6.4* 5.7* 5.9*  ALBUMIN 3.0* 2.5* 2.7*   No results for input(s): LIPASE, AMYLASE in the last 168 hours. No results for input(s): AMMONIA in the last 168 hours. Coagulation Profile: No results for input(s): INR, PROTIME in the last 168 hours. Cardiac Enzymes: No results for input(s): CKTOTAL, CKMB, CKMBINDEX, TROPONINI in the last 168 hours. BNP (last 3 results) No results for input(s): PROBNP in the last 8760 hours. HbA1C: No results for input(s): HGBA1C in the last 72 hours. CBG: No results for input(s): GLUCAP in the last 168 hours. Lipid Profile: No results for input(s): CHOL, HDL, LDLCALC, TRIG, CHOLHDL, LDLDIRECT in the last 72 hours. Thyroid Function Tests: No results for input(s): TSH, T4TOTAL, FREET4, T3FREE, THYROIDAB in the last 72 hours. Anemia Panel: No results for input(s): VITAMINB12, FOLATE, FERRITIN, TIBC, IRON, RETICCTPCT in the last 72 hours. Sepsis Labs: Recent Labs  Lab 03/24/21 1932  LATICACIDVEN 1.0    Recent Results (from the past 240 hour(s))  Blood culture (routine x 2)     Status: None (Preliminary result)   Collection Time: 03/24/21  9:08 PM   Specimen: BLOOD  Result Value Ref Range Status   Specimen Description BLOOD RIGHT ANTECUBITAL  Final    Special Requests   Final    BOTTLES DRAWN AEROBIC AND ANAEROBIC Blood Culture adequate volume   Culture   Final    NO GROWTH < 12 HOURS Performed at [facility], [address] [place], [place] [address]    Report Status PENDING  Incomplete  Resp Panel by RT-PCR (Flu A&B, Covid) Nasopharyngeal Swab     Status: None   Collection Time: 03/24/21  9:08 PM   Specimen: Nasopharyngeal Swab; Nasopharyngeal(NP) swabs in vial transport medium  Result Value Ref Range Status   SARS Coronavirus 2 by RT PCR NEGATIVE NEGATIVE Final    Comment: (NOTE) SARS-CoV-2 target nucleic acids are NOT DETECTED.  The SARS-CoV-2 RNA is generally detectable in upper respiratory specimens during the acute phase of infection. The lowest concentration of SARS-CoV-2 viral copies this assay can detect is 138 copies/mL. A negative result does not preclude SARS-Cov-2 infection and should not be used as the sole basis for treatment or other patient management decisions. A negative result may occur with  improper specimen collection/handling, submission of specimen  other than nasopharyngeal swab, presence of viral mutation(s) within the areas targeted by this assay, and inadequate number of viral copies(<138 copies/mL). A negative result must be combined with clinical observations, patient history, and epidemiological information. The expected result is Negative.  Fact Sheet for Patients:  EntrepreneurPulse.com.au  Fact Sheet for Healthcare Providers:  IncredibleEmployment.be  This test is no t yet approved or cleared by the [place] FDA and  has been authorized for detection and/or diagnosis of SARS-CoV-2 by FDA under an Emergency Use Authorization (EUA). This EUA will remain  in effect (meaning this test can be used) for the duration of the COVID-19 declaration under Section 564(b)(1) of the Act, 21 U.S.C.section 360bbb-3(b)(1), unless the authorization is  terminated  or revoked sooner.       Influenza A by PCR NEGATIVE NEGATIVE Final   Influenza B by PCR NEGATIVE NEGATIVE Final    Comment: (NOTE) The Xpert Xpress SARS-CoV-2/FLU/RSV plus assay is intended as an aid in the diagnosis of influenza from Nasopharyngeal swab specimens and should not be used as a sole basis for treatment. Nasal washings and aspirates are unacceptable for Xpert Xpress SARS-CoV-2/FLU/RSV testing.  Fact Sheet for Patients: EntrepreneurPulse.com.au  Fact Sheet for Healthcare Providers: IncredibleEmployment.be  This test is not yet approved or cleared by the [place] FDA and has been authorized for detection and/or diagnosis of SARS-CoV-2 by FDA under an Emergency Use Authorization (EUA). This EUA will remain in effect (meaning this test can be used) for the duration of the COVID-19 declaration under Section 564(b)(1) of the Act, 21 U.S.C. section 360bbb-3(b)(1), unless the authorization is terminated or revoked.  Performed at [facility], [address] [place], [place] [address]   Blood culture (routine x 2)     Status: None (Preliminary result)   Collection Time: 03/24/21  9:09 PM   Specimen: BLOOD  Result Value Ref Range Status   Specimen Description BLOOD BLOOD LEFT WRIST  Final   Special Requests   Final    BOTTLES DRAWN AEROBIC AND ANAEROBIC Blood Culture results may not be optimal due to an inadequate volume of blood received in culture bottles   Culture   Final    NO GROWTH < 12 HOURS Performed at [facility], [address] [place], [place] [address]    Report Status PENDING  Incomplete  Body fluid culture w Gram Stain     Status: None (Preliminary result)   Collection Time: 03/25/21  1:52 PM   Specimen: BILE; Body Fluid  Result Value Ref Range Status   Specimen Description   Final    BILE Performed at [facility], [address] [place], [place] [address]     Special Requests   Final    Normal Performed at [facility], [address] [place], [place] [address]    Gram Stain   Final    NO WBC SEEN NO ORGANISMS SEEN Performed at [facility], [place] [address] [place], [place] [address]    Culture PENDING  Incomplete   Report Status PENDING  Incomplete         Radiology Studies: DG Chest 2 View  Result Date: 03/24/2021 CLINICAL DATA:  Chest pain EXAM: CHEST - 2 VIEW COMPARISON:  05/13/2006 FINDINGS: The heart size and mediastinal contours are within normal limits. Both lungs are clear. The visualized skeletal structures are unremarkable. IMPRESSION: No active cardiopulmonary disease. Electronically Signed   By: Ulyses Jarred M.D.   On: 03/24/2021 19:57  CT ABDOMEN PELVIS W CONTRAST  Result Date: 03/24/2021 CLINICAL DATA:  Recent colonoscopy with polyp removal, new onset abdominal pain with decreased appetite EXAM: CT ABDOMEN AND PELVIS WITH CONTRAST TECHNIQUE: Multidetector CT imaging of the abdomen and pelvis was performed using the standard protocol following bolus administration of intravenous contrast. CONTRAST:  148mL OMNIPAQUE IOHEXOL 300 MG/ML  SOLN COMPARISON:  05/22/2018 FINDINGS: Lower chest: No acute abnormality. Hepatobiliary: Liver is within normal limits. Gallbladder is well distended with evidence of a rim calcified gallstone. Wall thickening and mild pericholecystic inflammatory changes noted suggestive of acute cholecystitis. No biliary ductal dilatation is seen. Pancreas: Unremarkable. No pancreatic ductal dilatation or surrounding inflammatory changes. Spleen: Normal in size without focal abnormality. Adrenals/Urinary Tract: Adrenal glands are unremarkable. Kidneys demonstrate a normal enhancement pattern bilaterally. No renal calculi or obstructive changes are seen. Normal excretion is noted on delayed images. Bladder is partially distended. Stomach/Bowel: Postsurgical changes are noted within the colon. No  obstructive or inflammatory changes are seen. No findings to suggest perforation related to the recent colonoscopy are noted. The appendix is within normal limits. Small bowel and stomach are unremarkable. Vascular/Lymphatic: Aortic atherosclerosis. No enlarged abdominal or pelvic lymph nodes. Reproductive: Calcified uterine fibroids are noted stable from the prior exam. No adnexal mass is noted. Other: No abdominal wall hernia or abnormality. No abdominopelvic ascites. Musculoskeletal: Degenerative changes of lumbar spine are noted. IMPRESSION: Changes suggestive of acute calculus cholecystitis. Ultrasound may be helpful for further evaluation. Postsurgical changes are seen in the colon. No acute abnormality related to the recent colonoscopy is seen. Electronically Signed   By: Inez Catalina M.D.   On: 03/24/2021 20:33   CT PERC CHOLECYSTOSTOMY  Result Date: 03/25/2021 INDICATION: Acute cholecystitis by imaging.  Non operative EXAM: CT PERCUTANEOUS TRANSHEPATIC CHOLECYSTOSTOMY MEDICATIONS: Patient is already on IV antibiotics as an inpatient.; The antibiotic was administered within an appropriate time frame prior to the initiation of the procedure. ANESTHESIA/SEDATION: Moderate (conscious) sedation was employed during this procedure. A total of Versed 2.0 mg and Fentanyl 100 mcg was administered intravenously. Moderate Sedation Time: 14 minutes. The patient's level of consciousness and vital signs were monitored continuously by radiology nursing throughout the procedure under my direct supervision. FLUOROSCOPY TIME:  Fluoroscopy Time: None. COMPLICATIONS: None immediate. PROCEDURE: Informed written consent was obtained from the patient after a thorough discussion of the procedural risks, benefits and alternatives. All questions were addressed. Maximal Sterile Barrier Technique was utilized including caps, mask, sterile gowns, sterile gloves, sterile drape, hand hygiene and skin antiseptic. A timeout was performed  prior to the initiation of the procedure. Previous imaging reviewed. Patient position slightly right anterior oblique. Noncontrast localization CT performed. The distended edematous gallbladder containing calcified gallstones was localized and marked for a right upper quadrant lower intercostal transhepatic approach. Under sterile conditions and local anesthesia, an 18 gauge introducer needle was advanced from the right upper quadrant transhepatic approach. Needle position confirmed with CT. Syringe aspiration yielded bile. Sample sent for culture. Guidewire inserted followed by tract dilatation insert a 10 French drain. Drain catheter position confirmed in the gallbladder. Catheter secured with a Prolene suture and connected to external gravity drainage bag. Sterile dressing applied. No immediate complication. Patient tolerated the procedure well. IMPRESSION: Successful CT-guided right upper quadrant percutaneous transhepatic cholecystostomy. Electronically Signed   By: Jerilynn Mages.  Shick M.D.   On: 03/25/2021 14:05   US Abdomen Limited RUQ (LIVER/GB)  Result Date: 03/24/2021 CLINICAL DATA:  Right upper quadrant pain EXAM: ULTRASOUND ABDOMEN LIMITED RIGHT UPPER QUADRANT  COMPARISON:  CT from earlier in the same day. FINDINGS: Gallbladder: Gallbladder is well distended with cholelithiasis and gallbladder sludge identified. Negative sonographic Murphy's sign is seen although wall thickening to 13 mm is noted with pericholecystic fluid. Common bile duct: Diameter: 3.1 mm. Liver: There is a 1.5 cm hyperechoic lesion better visualized than on the recent CT examination consistent with a focal hepatic hemangioma. No other focal lesion is noted. Liver echogenicity is otherwise within normal limits. Portal vein is patent on color Doppler imaging with normal direction of blood flow towards the liver. Other: None. IMPRESSION: Gallbladder sludge and cholelithiasis with wall thickening and pericholecystic fluid. A negative sonographic  Murphy's sign is elicited however these changes remain suspicious for acute cholecystitis. Hyperechoic lesion in the liver adjacent to the portal vein consistent with hemangioma. Electronically Signed   By: Inez Catalina M.D.   On: 03/24/2021 21:47        Scheduled Meds: . atorvastatin  40 mg Oral Daily  . carbamazepine  200 mg Oral Daily  . cholecalciferol  1,000 Units Oral Daily  . heparin  5,000 Units Subcutaneous Q8H  . hydrALAZINE  25 mg Oral BID  . insulin glargine  10 Units Subcutaneous Daily  . metoprolol succinate  12.5 mg Oral BID  . ramipril  5 mg Oral Daily  . sodium chloride flush  5 mL Intracatheter Q8H   Continuous Infusions: . sodium chloride 100 mL/hr at 03/26/21 0302  . piperacillin-tazobactam (ZOSYN)  IV 3.375 g (03/26/21 0528)          Aline August, MD [facility] 03/26/2021, 7:19 AM

## 2021-03-26 NOTE — Plan of Care (Signed)

## 2021-03-26 NOTE — Discharge Instructions (Signed)
In addition to included general instructions,  Diet: Resume home diet, recommend avoiding or limiting fatty foods.   Drain care: Monitor drain output daily, I will provide you a hand out. Flush drain daily with 5-10 ccs of normal saline.   Call office [phone] / [phone]) at any time if any questions, worsening pain, fevers/chills, bleeding, drainage from incision site, or other concerns.

## 2021-03-26 NOTE — Care Management (Signed)
Assessment complete full note to follow

## 2021-03-26 NOTE — Evaluation (Signed)
Physical Therapy Evaluation Patient Details Name: [name] MRN: [id number] DOB: 04-05-1946 Today's Date: 03/26/2021   History of Present Illness  [name] is a 23yoF who comes [facility] on with decreased apetite. Pt underwent colonoscopy and polyp removal at [facility] >1WA. PMH: colonCA s/p resection, DM2, HTN, HLD, SBO, seizure d/o. Pt admitted with acute cholecystitis, large gallstones in GB neck. Pt not optimaized for lap chole at present, but underwent cholecysostomy tube placement on 5/9 c IR.  Clinical Impression  Pt admitted with above diagnosis. Pt currently with functional limitations due to the deficits listed below (see "PT Problem List"). Upon entry, pt in bed, awake and agreeable to participate. The pt is alert, pleasant, interactive, and able to provide info regarding prior level of function, both in tolerance and independence. Strong desire to get OOB. Pt performs all basic mobility at supervision level or better. RW used for AMB in room, very slow and guarded due to pain, but overall tolerated quite well. Will seek to attempt stairs next session. Patient's performance this date reveals decreased ability, independence, and tolerance in performing all basic mobility required for performance of activities of daily living. Pt requires additional DME, close physical assistance, and cues for safe participate in mobility. Pt will benefit from skilled PT intervention to increase independence and safety with basic mobility in preparation for discharge to the venue listed below.       Follow Up Recommendations Home health PT    Equipment Recommendations  None recommended by PT    Recommendations for Other Services       Precautions / Restrictions Precautions Precautions: Fall Restrictions Weight Bearing Restrictions: No      Mobility  Bed Mobility Overal bed mobility: Modified Independent                  Transfers Overall transfer level: Needs  assistance Equipment used: Rolling walker (2 wheeled) Transfers: Sit to/from Stand Sit to Stand: Supervision         General transfer comment: performed 1x from EOB, 5x from recliner  Ambulation/Gait Ambulation/Gait assistance: Supervision Gait Distance (Feet): 120 Feet Assistive device: Rolling walker (2 wheeled)   Gait velocity: 0.39m/s   General Gait Details: author drives IV pole; speed is slow adn guarded due to pain  Stairs            Wheelchair Mobility    Modified Rankin (Stroke Patients Only)       Balance Overall balance assessment: Modified Independent                                           Pertinent Vitals/Pain Pain Assessment: 0-10 Pain Score: 6  Pain Location: incission site for chelocestostomy Pain Intervention(s): Limited activity within patient's tolerance    Home Living Family/patient expects to be discharged to:: Private residence Living Arrangements: Children Available Help at Discharge: Family;Other (Comment) (brother (works) Advice worker.) Type of Home: House Home Access: Stairs to enter Entrance Stairs-Rails: Building surveyor of Steps: 2 Home Layout: One level Home Equipment: Environmental consultant - 2 wheels      Prior Function Level of Independence: Independent with assistive device(s)   Gait / Transfers Assistance Needed: mostly independent household distance AMB, no device  ADL's / Homemaking Assistance Needed: independent at baseline; still driving prior to [employer]  Extremity/Trunk Assessment   Upper Extremity Assessment Upper Extremity Assessment: Overall WFL for tasks assessed    Lower Extremity Assessment Lower Extremity Assessment: Overall WFL for tasks assessed       Communication      Cognition Arousal/Alertness: Awake/alert Behavior During Therapy: WFL for tasks assessed/performed Overall Cognitive Status: Within Functional Limits for tasks assessed                                         General Comments      Exercises     Assessment/Plan    PT Assessment Patient needs continued PT services  PT Problem List Decreased strength;Decreased activity tolerance;Decreased balance;Decreased mobility       PT Treatment Interventions DME instruction;Gait training;Stair training;Functional mobility training;Therapeutic activities;Therapeutic exercise;Patient/family education    PT Goals (Current goals can be found in the Care Plan section)  Acute Rehab PT Goals Patient Stated Goal: return to independent mobilty PT Goal Formulation: With patient Time For Goal Achievement: 04/09/21 Potential to Achieve Goals: Good    Frequency Min 2X/week   Barriers to discharge        Co-evaluation               AM-PAC PT "6 Clicks" Mobility  Outcome Measure Help needed turning from your back to your side while in a flat bed without using bedrails?: A Little Help needed moving from lying on your back to sitting on the side of a flat bed without using bedrails?: A Little Help needed moving to and from a bed to a chair (including a wheelchair)?: A Little Help needed standing up from a chair using your arms (e.g., wheelchair or bedside chair)?: A Little Help needed to walk in hospital room?: A Little Help needed climbing 3-5 steps with a railing? : A Little 6 Click Score: 18    End of Session   Activity Tolerance: Patient tolerated treatment well;No increased pain;Patient limited by pain Patient left: in chair;with call bell/phone within reach Nurse Communication: Mobility status PT Visit Diagnosis: Other abnormalities of gait and mobility (R26.89);Muscle weakness (generalized) (M62.81)    Time: 5284-1324 PT Time Calculation (min) (ACUTE ONLY): 33 min   Charges:   PT Evaluation $PT Eval Moderate Complexity: 1 Mod PT Treatments $Therapeutic Exercise: 8-22 mins        3:15 PM, 03/26/21 Etta Grandchild, PT,  DPT Physical Therapist - [facility]  [phone] ([place])    Independent [place] C 03/26/2021, 3:11 PM

## 2021-03-26 NOTE — Progress Notes (Signed)
Mobility Specialist - Progress Note   03/26/21 1700  Mobility  Activity Ambulated in hall  Level of Assistance Standby assist, set-up cues, supervision of patient - no hands on  Assistive Device Front wheel walker  Distance Ambulated (ft) 520 ft  Mobility Ambulated with assistance in hallway  Mobility Response Tolerated well  Mobility performed by Mobility specialist  $Mobility charge 1 Mobility    Pt ambulated >500' in hallway using RW. No LOB. Denied SOB on RA, O2 difficult to determine d/t poor pleth. Pt began ambulation pushing IV pole, then RW was provided for increased comfort. Upon return to room, pt voiced feeling a little winded. Very motivated.    Kathee Delton Mobility Specialist 03/26/21, 5:33 PM

## 2021-03-27 LAB — BASIC METABOLIC PANEL
Anion gap: 6 (ref 5–15)
BUN: 15 mg/dL (ref 8–23)
CO2: 26 mmol/L (ref 22–32)
Calcium: 8 mg/dL — ABNORMAL LOW (ref 8.9–10.3)
Chloride: 103 mmol/L (ref 98–111)
Creatinine, Ser: 1.33 mg/dL — ABNORMAL HIGH (ref 0.44–1.00)
GFR, Estimated: 42 mL/min — ABNORMAL LOW (ref >60)
Glucose, Bld: 178 mg/dL — ABNORMAL HIGH (ref 70–99)
Potassium: 3.6 mmol/L (ref 3.5–5.1)
Sodium: 135 mmol/L (ref 135–145)

## 2021-03-27 LAB — CBC WITH DIFFERENTIAL/PLATELET
Abs Immature Granulocytes: 0.07 10*3/uL (ref 0.00–0.07)
Basophils Absolute: 0 10*3/uL (ref 0.0–0.1)
Basophils Relative: 0 %
Eosinophils Absolute: 0.1 10*3/uL (ref 0.0–0.5)
Eosinophils Relative: 1 %
HCT: 27.9 % — ABNORMAL LOW (ref 36.0–46.0)
Hemoglobin: 9.2 g/dL — ABNORMAL LOW (ref 12.0–15.0)
Immature Granulocytes: 1 %
Lymphocytes Relative: 8 %
Lymphs Abs: 0.6 10*3/uL — ABNORMAL LOW (ref 0.7–4.0)
MCH: 29.4 pg (ref 26.0–34.0)
MCHC: 33 g/dL (ref 30.0–36.0)
MCV: 89.1 fL (ref 80.0–100.0)
Monocytes Absolute: 0.9 10*3/uL (ref 0.1–1.0)
Monocytes Relative: 11 %
Neutro Abs: 6.7 10*3/uL (ref 1.7–7.7)
Neutrophils Relative %: 79 %
Platelets: 318 10*3/uL (ref 150–400)
RBC: 3.13 MIL/uL — ABNORMAL LOW (ref 3.87–5.11)
RDW: 13.5 % (ref 11.5–15.5)
WBC: 8.5 10*3/uL (ref 4.0–10.5)
nRBC: 0 % (ref 0.0–0.2)

## 2021-03-27 LAB — MAGNESIUM: Magnesium: 1.8 mg/dL (ref 1.7–2.4)

## 2021-03-27 MED ORDER — AMOXICILLIN-POT CLAVULANATE 875-125 MG PO TABS: 1.0000 | ORAL_TABLET | Freq: Two times a day (BID) | ORAL | 0 refills | Status: AC

## 2021-03-27 NOTE — Care Management Important Message (Signed)
Important Message  Patient Details  Name: [name] MRN: [id number] Date of Birth: 1946/11/16   Medicare Important Message Given:  Yes     Dannette Barbara 03/27/2021, 10:37 AM

## 2021-03-27 NOTE — TOC Initial Note (Signed)
Transition of Care [facility]) - Initial/Assessment Note    Patient Details  Name: [name] MRN: [id number] Date of Birth: 12/15/1945  [facility] CM/SW Contact:    Beverly Sessions, RN Phone Number: 03/27/2021, 9:02 AM  Clinical Narrative:                 Patient admitted from home with acute cholecystitis.  Patient lives at home with daughter.  Patient will discharge with cholecystomy tube. Daughter has been educated on drain care  PCP [facility] - denies issues obtaining medication or with transportation  PT has assessed patient and recommends home health. Discussed with daughter and patient.  Both are agreeable to home health services at discharge.  Neither have a preference of home health agency.  Referral to made [facility] with [place]   Patient to discharge today.  Corene Cornea with [facility] notified of discharge   Expected Discharge Plan: [place] Barriers to Discharge: No Barriers Identified   Patient Goals and CMS Choice        Expected Discharge Plan and Services Expected Discharge Plan: [place]       Living arrangements for the past 2 months: Single Family Home Expected Discharge Date: 03/27/21                         HH Arranged: RN,PT [place] Agency: [facility] ([place]) Date HH Agency Contacted: 03/27/21   Representative spoke with at [place]: Corene Cornea  Prior Living Arrangements/Services Living arrangements for the past 2 months: [place] Lives with:: Adult [place] Patient language and need for interpreter reviewed:: Yes Do you feel safe going back to the place where you live?: Yes      Need for Family Participation in Patient Care: Yes (Comment) Care giver support system in place?: Yes (comment) Current home services: DME Criminal Activity/Legal Involvement Pertinent to Current Situation/Hospitalization: No - Comment as needed  Activities of Daily  Living Home Assistive Devices/Equipment: None ADL Screening (condition at time of admission) Patient's cognitive ability adequate to safely complete daily activities?: Yes Is the patient deaf or have difficulty hearing?: No Does the patient have difficulty seeing, even when wearing glasses/contacts?: No Does the patient have difficulty concentrating, remembering, or making decisions?: No Patient able to express need for assistance with ADLs?: Yes Does the patient have difficulty dressing or bathing?: No Independently performs ADLs?: Yes (appropriate for developmental age) Does the patient have difficulty walking or climbing stairs?: Yes Weakness of Legs: Both Weakness of Arms/Hands: None  Permission Sought/Granted                  Emotional Assessment Appearance:: Appears stated age     Orientation: : Oriented to Self,Oriented to Place,Oriented to  Time,Oriented to Situation Alcohol / Substance Use: Not Applicable Psych Involvement: No (comment)  Admission diagnosis:  Acute cholecystitis [K81.0] Hyponatremia [E87.1] RUQ pain [R10.11] Patient Active Problem List   Diagnosis Date Noted  . Acute cholecystitis 03/24/2021  . Mood disorder ([place]) 12/21/2019  . 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 Bilateral carotid artery stenosis 08/24/2018  . Atherosclerosis of aorta ([place]) 08/24/2018  . Type II diabetes mellitus with complication ([place]) 48/54/6270  . Bradycardia 07/24/2018  . Neoplasm of uncertain behavior of skin 01/16/2017  . 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 Essential (primary) hypertension 05/08/2015  . History of rectal cancer 05/08/2015  . Hyperlipidemia associated with type  2 diabetes mellitus ([place]) 05/08/2015  . Seizure disorder ([place]) 05/08/2015  . Shoulder strain 05/08/2015  . Avitaminosis D 05/08/2015   PCP:  Glean Hess, MD Pharmacy:   [facility], [place] [address] [place] [address] Phone: [phone] Fax: [phone] [place] [address], [place] [facility] [address] AT [place] [address] [facility] [address] Phone: [phone] Fax: [phone]  [facility] [address] [place] [address] Phone: [phone] Fax: [phone]  [facility] [place], [place] [address] Phone: [phone] Fax: [phone]     Social Determinants of Health (SDOH) Interventions    Readmission Risk Interventions No flowsheet data found.

## 2021-03-27 NOTE — Discharge Summary (Signed)
Physician Discharge Summary  [name] [id number] DOB: [birth year] DOA: 03/24/2021  PCP: Glean Hess, MD  Admit date: 03/24/2021 Discharge date: 03/27/2021 30 Day Unplanned Readmission Risk Score   Flowsheet Row ED to Hosp-Admission (Current) from 03/24/2021 in [place]  30 Day Unplanned Readmission Risk Score (%) 12.73 Filed at 03/27/2021 0801     This score is the patient's risk of an unplanned readmission within 30 days of being discharged (0 -100%). The score is based on dignosis, age, lab data, medications, orders, and past utilization.   Low:  0-14.9   Medium: 15-21.9   High: 22-29.9   Extreme: 30 and above         Admitted From: Home Disposition: Home  Recommendations for Outpatient Follow-up:  1. Follow up with PCP in 1-2 weeks 2. Follow-up with IR per the recommendations 3. Follow-up with general surgery in 3 to 4 weeks. 4. Please obtain BMP/CBC in one week 5. Please follow up with your PCP on the following pending results: Unresulted Labs (From admission, onward)         None        Home Health: Yes Equipment/Devices: None  Discharge Condition: Stable CODE STATUS: Full code Diet recommendation: Heart healthy  Subjective: Seen and examined.  Has very minimal abdominal pain but tolerated regular diet.  Daughter at the bedside.  She is ready to go home.  Brief/Interim Summary: 75 year old female with history of colon cancer status postresection, diabetes mellitus type 2, hypertension, hyperlipidemia, small bowel obstructions, seizure disorder presented with decreased appetite, abdominal pain, confusion and chest pain.  She was found to have hyponatremia with sodium of 122. CT abdomen with pelvis and right upper quadrant ultrasound consistent with acute calculus cholecystitis. She was started on IV fluids, antibiotics and general surgery was consulted.  She underwent CT cholecystostomy by IR on 03/25/2021.  Her diet was  advanced and she is currently tolerating regular diet.  Has history of CKD stage IIIa and her renal function at her baseline.  She was provided IV fluids and with that her sodium has improved and is normal today.  She is doing well and has been cleared by general surgery and IR so she is going to be discharged today with 10 days of oral Augmentin as recommended by general surgery.  Discharge Diagnoses:  Active Problems:   Acute cholecystitis    Discharge Instructions   Allergies as of 03/27/2021      Reactions   Baclofen Other (See Comments)   Pioglitazone Nausea Only      Medication List    TAKE these medications   amoxicillin-clavulanate 875-125 MG tablet Commonly known as: Augmentin Take 1 tablet by mouth 2 (two) times daily for 10 days.   aspirin 81 MG chewable tablet Chew 81 mg by mouth daily.   atorvastatin 40 MG tablet Commonly known as: LIPITOR TAKE 1 TABLET BY MOUTH DAILY   calcium carbonate 1500 (600 Ca) MG Tabs tablet Commonly known as: OSCAL Take 600 mg of elemental calcium by mouth daily.   carbamazepine 200 MG tablet Commonly known as: TEGRETOL TAKE 1 TABLET(200 MG) BY MOUTH DAILY   Cholecalciferol 25 MCG (1000 UT) capsule Take 1,000 Units by mouth daily.   Fluocinolone Acetonide 0.01 % Oil INSTILL 4 DROPS INTO BOTH EARS THREE TIMES DAILY AS NEEDED FOR DRYNESS AND ITCHING   hydrALAZINE 25 MG tablet Commonly known as: APRESOLINE Take 1 tablet (25 mg total) by mouth 2 (two) times daily.  hydrochlorothiazide 12.5 MG capsule Commonly known as: MICROZIDE TAKE 1 CAPSULE(12.5 MG) BY MOUTH DAILY   Lantus SoloStar 100 UNIT/ML Solostar Pen Generic drug: insulin glargine Inject 20-25 Units into the skin daily. What changed: how much to take   meloxicam 15 MG tablet Commonly known as: MOBIC Take 15 mg by mouth as needed for pain.   metFORMIN 500 MG 24 hr tablet Commonly known as: GLUCOPHAGE-XR TAKE 2 TABLETS BY MOUTH 2 TIMES DAILY   metoprolol  succinate 25 MG 24 hr tablet Commonly known as: TOPROL-XL Take 0.5 tablets (12.5 mg total) by mouth in the morning and at bedtime.   nitroGLYCERIN 0.4 MG SL tablet Commonly known as: NITROSTAT Place 1 tablet (0.4 mg total) under the tongue every 5 (five) minutes x 3 doses as needed for chest pain.   OneTouch Ultra test strip Generic drug: glucose blood USE 1 STRIP TO CHECK GLUCOSE TWICE DAILY   ramipril 5 MG capsule Commonly known as: ALTACE TAKE 1 CAPSULE BY MOUTH DAILY   SYSTANE BALANCE OP Apply to eye.       Follow-up Information    Greggory Keen, MD.   Specialties: Interventional Radiology, Radiology Why: Schedulers will contact you with date and time of follow-up appointment.  Contact information: [place] [address] [phone]        Jules Husbands, MD. Go on 04/17/2021.   Specialty: General Surgery Why: 2:30pm arrive appointment is at 2:45pm Contact information: [address] [place] [address] [phone]        Glean Hess, MD Follow up in 1 week(s).   Specialty: Internal Medicine Contact information: [address] [place] [address] [phone]              Allergies  Allergen Reactions  . Baclofen Other (See Comments)  . Pioglitazone Nausea Only    Consultations: General surgery and IR   Procedures/Studies: DG Chest 2 View  Result Date: 03/24/2021 CLINICAL DATA:  Chest pain EXAM: CHEST - 2 VIEW COMPARISON:  05/13/2006 FINDINGS: The heart size and mediastinal contours are within normal limits. Both lungs are clear. The visualized skeletal structures are unremarkable. IMPRESSION: No active cardiopulmonary disease. Electronically Signed   By: Ulyses Jarred M.D.   On: 03/24/2021 19:57   CT ABDOMEN PELVIS W CONTRAST  Result Date: 03/24/2021 CLINICAL DATA:  Recent colonoscopy with polyp removal, new onset abdominal pain with decreased appetite EXAM: CT ABDOMEN AND PELVIS  WITH CONTRAST TECHNIQUE: Multidetector CT imaging of the abdomen and pelvis was performed using the standard protocol following bolus administration of intravenous contrast. CONTRAST:  151mL OMNIPAQUE IOHEXOL 300 MG/ML  SOLN COMPARISON:  05/22/2018 FINDINGS: Lower chest: No acute abnormality. Hepatobiliary: Liver is within normal limits. Gallbladder is well distended with evidence of a rim calcified gallstone. Wall thickening and mild pericholecystic inflammatory changes noted suggestive of acute cholecystitis. No biliary ductal dilatation is seen. Pancreas: Unremarkable. No pancreatic ductal dilatation or surrounding inflammatory changes. Spleen: Normal in size without focal abnormality. Adrenals/Urinary Tract: Adrenal glands are unremarkable. Kidneys demonstrate a normal enhancement pattern bilaterally. No renal calculi or obstructive changes are seen. Normal excretion is noted on delayed images. Bladder is partially distended. Stomach/Bowel: Postsurgical changes are noted within the colon. No obstructive or inflammatory changes are seen. No findings to suggest perforation related to the recent colonoscopy are noted. The appendix is within normal limits. Small bowel and stomach are unremarkable. Vascular/Lymphatic: Aortic atherosclerosis. No enlarged abdominal or pelvic lymph  nodes. Reproductive: Calcified uterine fibroids are noted stable from the prior exam. No adnexal mass is noted. Other: No abdominal wall hernia or abnormality. No abdominopelvic ascites. Musculoskeletal: Degenerative changes of lumbar spine are noted. IMPRESSION: Changes suggestive of acute calculus cholecystitis. Ultrasound may be helpful for further evaluation. Postsurgical changes are seen in the colon. No acute abnormality related to the recent colonoscopy is seen. Electronically Signed   By: Inez Catalina M.D.   On: 03/24/2021 20:33   CT PERC CHOLECYSTOSTOMY  Result Date: 03/25/2021 INDICATION: Acute cholecystitis by imaging.  Non  operative EXAM: CT PERCUTANEOUS TRANSHEPATIC CHOLECYSTOSTOMY MEDICATIONS: Patient is already on IV antibiotics as an inpatient.; The antibiotic was administered within an appropriate time frame prior to the initiation of the procedure. ANESTHESIA/SEDATION: Moderate (conscious) sedation was employed during this procedure. A total of Versed 2.0 mg and Fentanyl 100 mcg was administered intravenously. Moderate Sedation Time: 14 minutes. The patient's level of consciousness and vital signs were monitored continuously by radiology nursing throughout the procedure under my direct supervision. FLUOROSCOPY TIME:  Fluoroscopy Time: None. COMPLICATIONS: None immediate. PROCEDURE: Informed written consent was obtained from the patient after a thorough discussion of the procedural risks, benefits and alternatives. All questions were addressed. Maximal Sterile Barrier Technique was utilized including caps, mask, sterile gowns, sterile gloves, sterile drape, hand hygiene and skin antiseptic. A timeout was performed prior to the initiation of the procedure. Previous imaging reviewed. Patient position slightly right anterior oblique. Noncontrast localization CT performed. The distended edematous gallbladder containing calcified gallstones was localized and marked for a right upper quadrant lower intercostal transhepatic approach. Under sterile conditions and local anesthesia, an 18 gauge introducer needle was advanced from the right upper quadrant transhepatic approach. Needle position confirmed with CT. Syringe aspiration yielded bile. Sample sent for culture. Guidewire inserted followed by tract dilatation insert a 10 French drain. Drain catheter position confirmed in the gallbladder. Catheter secured with a Prolene suture and connected to external gravity drainage bag. Sterile dressing applied. No immediate complication. Patient tolerated the procedure well. IMPRESSION: Successful CT-guided right upper quadrant percutaneous  transhepatic cholecystostomy. Electronically Signed   By: Jerilynn Mages.  Shick M.D.   On: 03/25/2021 14:05   US Abdomen Limited RUQ (LIVER/GB)  Result Date: 03/24/2021 CLINICAL DATA:  Right upper quadrant pain EXAM: ULTRASOUND ABDOMEN LIMITED RIGHT UPPER QUADRANT COMPARISON:  CT from earlier in the same day. FINDINGS: Gallbladder: Gallbladder is well distended with cholelithiasis and gallbladder sludge identified. Negative sonographic Murphy's sign is seen although wall thickening to 13 mm is noted with pericholecystic fluid. Common bile duct: Diameter: 3.1 mm. Liver: There is a 1.5 cm hyperechoic lesion better visualized than on the recent CT examination consistent with a focal hepatic hemangioma. No other focal lesion is noted. Liver echogenicity is otherwise within normal limits. Portal vein is patent on color Doppler imaging with normal direction of blood flow towards the liver. Other: None. IMPRESSION: Gallbladder sludge and cholelithiasis with wall thickening and pericholecystic fluid. A negative sonographic Murphy's sign is elicited however these changes remain suspicious for acute cholecystitis. Hyperechoic lesion in the liver adjacent to the portal vein consistent with hemangioma. Electronically Signed   By: Inez Catalina M.D.   On: 03/24/2021 21:47     Discharge Exam: Vitals:   03/27/21 0320 03/27/21 0745  BP: (!) 135/52 (!) 153/56  Pulse: 77 64  Resp: 16 18  Temp: 98 F (36.7 C) 98 F (36.7 C)  SpO2: 98% 100%   Vitals:   03/26/21 2015 03/26/21 2318 03/27/21  0320 03/27/21 0745  BP: (!) 110/49 (!) 140/53 (!) 135/52 (!) 153/56  Pulse: 75 71 77 64  Resp: 16 18 16 18   Temp: 98.3 F (36.8 C) 98.7 F (37.1 C) 98 F (36.7 C) 98 F (36.7 C)  TempSrc:  Oral Oral Oral  SpO2: 98% 98% 98% 100%  Weight:      Height:        General: Pt is alert, awake, not in acute distress Cardiovascular: RRR, S1/S2 +, no rubs, no gallops Respiratory: CTA bilaterally, no wheezing, no rhonchi Abdominal: Soft,  NT, ND, bowel sounds +, cholecystostomy drain at right upper quadrant. Extremities: no edema, no cyanosis    The results of significant diagnostics from this hospitalization (including imaging, microbiology, ancillary and laboratory) are listed below for reference.     Microbiology: Recent Results (from the past 240 hour(s))  Blood culture (routine x 2)     Status: None (Preliminary result)   Collection Time: 03/24/21  9:08 PM   Specimen: BLOOD  Result Value Ref Range Status   Specimen Description BLOOD RIGHT ANTECUBITAL  Final   Special Requests   Final    BOTTLES DRAWN AEROBIC AND ANAEROBIC Blood Culture adequate volume   Culture   Final    NO GROWTH 3 DAYS Performed at [facility], [address] [place], [place] [address]    Report Status PENDING  Incomplete  Resp Panel by RT-PCR (Flu A&B, Covid) Nasopharyngeal Swab     Status: None   Collection Time: 03/24/21  9:08 PM   Specimen: Nasopharyngeal Swab; Nasopharyngeal(NP) swabs in vial transport medium  Result Value Ref Range Status   SARS Coronavirus 2 by RT PCR NEGATIVE NEGATIVE Final    Comment: (NOTE) SARS-CoV-2 target nucleic acids are NOT DETECTED.  The SARS-CoV-2 RNA is generally detectable in upper respiratory specimens during the acute phase of infection. The lowest concentration of SARS-CoV-2 viral copies this assay can detect is 138 copies/mL. A negative result does not preclude SARS-Cov-2 infection and should not be used as the sole basis for treatment or other patient management decisions. A negative result may occur with  improper specimen collection/handling, submission of specimen other than nasopharyngeal swab, presence of viral mutation(s) within the areas targeted by this assay, and inadequate number of viral copies(<138 copies/mL). A negative result must be combined with clinical observations, patient history, and epidemiological information. The expected result is Negative.  Fact Sheet for  Patients:  EntrepreneurPulse.com.au  Fact Sheet for Healthcare Providers:  IncredibleEmployment.be  This test is no t yet approved or cleared by the [place] FDA and  has been authorized for detection and/or diagnosis of SARS-CoV-2 by FDA under an Emergency Use Authorization (EUA). This EUA will remain  in effect (meaning this test can be used) for the duration of the COVID-19 declaration under Section 564(b)(1) of the Act, 21 U.S.C.section 360bbb-3(b)(1), unless the authorization is terminated  or revoked sooner.       Influenza A by PCR NEGATIVE NEGATIVE Final   Influenza B by PCR NEGATIVE NEGATIVE Final    Comment: (NOTE) The Xpert Xpress SARS-CoV-2/FLU/RSV plus assay is intended as an aid in the diagnosis of influenza from Nasopharyngeal swab specimens and should not be used as a sole basis for treatment. Nasal washings and aspirates are unacceptable for Xpert Xpress SARS-CoV-2/FLU/RSV testing.  Fact Sheet for Patients: EntrepreneurPulse.com.au  Fact Sheet for Healthcare Providers: IncredibleEmployment.be  This test is not yet approved or cleared by the [place] FDA and has been  authorized for detection and/or diagnosis of SARS-CoV-2 by FDA under an Emergency Use Authorization (EUA). This EUA will remain in effect (meaning this test can be used) for the duration of the COVID-19 declaration under Section 564(b)(1) of the Act, 21 U.S.C. section 360bbb-3(b)(1), unless the authorization is terminated or revoked.  Performed at [facility], [address] [place], [place] [address]   Blood culture (routine x 2)     Status: None (Preliminary result)   Collection Time: 03/24/21  9:09 PM   Specimen: BLOOD  Result Value Ref Range Status   Specimen Description BLOOD BLOOD LEFT WRIST  Final   Special Requests   Final    BOTTLES DRAWN AEROBIC AND ANAEROBIC Blood Culture results may not  be optimal due to an inadequate volume of blood received in culture bottles   Culture   Final    NO GROWTH 3 DAYS Performed at [facility], [address], [place], [place] [address]    Report Status PENDING  Incomplete  Body fluid culture w Gram Stain     Status: None (Preliminary result)   Collection Time: 03/25/21  1:52 PM   Specimen: BILE; Body Fluid  Result Value Ref Range Status   Specimen Description   Final    BILE Performed at [facility], [address] [place], [place] [address]    Special Requests   Final    Normal Performed at [facility], [address] [place], [place] [address]    Gram Stain NO WBC SEEN NO ORGANISMS SEEN   Final   Culture   Final    NO GROWTH 2 DAYS Performed at [facility], [place] [address], [place],  [address]    Report Status PENDING  Incomplete     Labs: BNP (last 3 results) No results for input(s): BNP in the last 8760 hours. Basic Metabolic Panel: Recent Labs  Lab 03/24/21 1843 03/25/21 0504 03/26/21 0241 03/27/21 0311  NA 122* 127* 134* 135  K 3.5 3.4* 3.7 3.6  CL 85* 91* 97* 103  CO2 25 26 26 26   GLUCOSE 154* 133* 131* 178*  BUN 20 18 15 15   CREATININE 0.96 0.92 1.28* 1.33*  CALCIUM 8.5* 8.3* 8.5* 8.0*  MG  --   --  1.9 1.8   Liver Function Tests: Recent Labs  Lab 03/24/21 1955 03/25/21 0504 03/26/21 0241  AST 31 24 26   ALT 27 24 24   ALKPHOS 139* 108 120  BILITOT 0.7 0.5 0.6  PROT 6.4* 5.7* 5.9*  ALBUMIN 3.0* 2.5* 2.7*   No results for input(s): LIPASE, AMYLASE in the last 168 hours. No results for input(s): AMMONIA in the last 168 hours. CBC: Recent Labs  Lab 03/24/21 1843 03/25/21 0504 03/26/21 0241 03/27/21 0311  WBC 12.9* 9.4 9.3 8.5  NEUTROABS  --   --  7.2 6.7  HGB 10.5* 9.2* 9.8* 9.2*  HCT 31.0* 27.5* 30.1* 27.9*  MCV 85.6 86.5 88.5 89.1  PLT 349 291 347 318   Cardiac Enzymes: No results for input(s): CKTOTAL, CKMB, CKMBINDEX, TROPONINI in the  last 168 hours. BNP: Invalid input(s): POCBNP CBG: No results for input(s): GLUCAP in the last 168 hours. D-Dimer No results for input(s): DDIMER in the last 72 hours. Hgb A1c No results for input(s): HGBA1C in the last 72 hours. Lipid Profile No results for input(s): CHOL, HDL, LDLCALC, TRIG, CHOLHDL, LDLDIRECT in the last 72 hours. Thyroid function studies No results for input(s): TSH, T4TOTAL, T3FREE,  THYROIDAB in the last 72 hours.  Invalid input(s): FREET3 Anemia work up No results for input(s): VITAMINB12, FOLATE, FERRITIN, TIBC, IRON, RETICCTPCT in the last 72 hours. Urinalysis    Component Value Date/Time   COLORURINE YELLOW (A) 07/24/2018 1247   APPEARANCEUR CLEAR (A) 07/24/2018 1247   LABSPEC 1.011 07/24/2018 1247   PHURINE 8.0 07/24/2018 1247   GLUCOSEU NEGATIVE 07/24/2018 1247   HGBUR NEGATIVE 07/24/2018 1247   BILIRUBINUR neg 12/21/2019 1125   KETONESUR NEGATIVE 07/24/2018 1247   PROTEINUR Negative 12/21/2019 1125   PROTEINUR NEGATIVE 07/24/2018 1247   UROBILINOGEN 0.2 12/21/2019 1125   NITRITE neg 12/21/2019 1125   NITRITE NEGATIVE 07/24/2018 1247   LEUKOCYTESUR Negative 12/21/2019 1125   Sepsis Labs Invalid input(s): PROCALCITONIN,  WBC,  LACTICIDVEN Microbiology Recent Results (from the past 240 hour(s))  Blood culture (routine x 2)     Status: None (Preliminary result)   Collection Time: 03/24/21  9:08 PM   Specimen: BLOOD  Result Value Ref Range Status   Specimen Description BLOOD RIGHT ANTECUBITAL  Final   Special Requests   Final    BOTTLES DRAWN AEROBIC AND ANAEROBIC Blood Culture adequate volume   Culture   Final    NO GROWTH 3 DAYS Performed at [facility], [address] [place], [place] [address]    Report Status PENDING  Incomplete  Resp Panel by RT-PCR (Flu A&B, Covid) Nasopharyngeal Swab     Status: None   Collection Time: 03/24/21  9:08 PM   Specimen: Nasopharyngeal Swab; Nasopharyngeal(NP) swabs in vial transport medium   Result Value Ref Range Status   SARS Coronavirus 2 by RT PCR NEGATIVE NEGATIVE Final    Comment: (NOTE) SARS-CoV-2 target nucleic acids are NOT DETECTED.  The SARS-CoV-2 RNA is generally detectable in upper respiratory specimens during the acute phase of infection. The lowest concentration of SARS-CoV-2 viral copies this assay can detect is 138 copies/mL. A negative result does not preclude SARS-Cov-2 infection and should not be used as the sole basis for treatment or other patient management decisions. A negative result may occur with  improper specimen collection/handling, submission of specimen other than nasopharyngeal swab, presence of viral mutation(s) within the areas targeted by this assay, and inadequate number of viral copies(<138 copies/mL). A negative result must be combined with clinical observations, patient history, and epidemiological information. The expected result is Negative.  Fact Sheet for Patients:  EntrepreneurPulse.com.au  Fact Sheet for Healthcare Providers:  IncredibleEmployment.be  This test is no t yet approved or cleared by the [place] FDA and  has been authorized for detection and/or diagnosis of SARS-CoV-2 by FDA under an Emergency Use Authorization (EUA). This EUA will remain  in effect (meaning this test can be used) for the duration of the COVID-19 declaration under Section 564(b)(1) of the Act, 21 U.S.C.section 360bbb-3(b)(1), unless the authorization is terminated  or revoked sooner.       Influenza A by PCR NEGATIVE NEGATIVE Final   Influenza B by PCR NEGATIVE NEGATIVE Final    Comment: (NOTE) The Xpert Xpress SARS-CoV-2/FLU/RSV plus assay is intended as an aid in the diagnosis of influenza from Nasopharyngeal swab specimens and should not be used as a sole basis for treatment. Nasal washings and aspirates are unacceptable for Xpert Xpress SARS-CoV-2/FLU/RSV testing.  Fact Sheet for  Patients: EntrepreneurPulse.com.au  Fact Sheet for Healthcare Providers: IncredibleEmployment.be  This test is not yet approved or cleared by the [place] FDA and has been authorized for detection and/or diagnosis of  SARS-CoV-2 by FDA under an Emergency Use Authorization (EUA). This EUA will remain in effect (meaning this test can be used) for the duration of the COVID-19 declaration under Section 564(b)(1) of the Act, 21 U.S.C. section 360bbb-3(b)(1), unless the authorization is terminated or revoked.  Performed at [facility], [address] [place], [place] [address]   Blood culture (routine x 2)     Status: None (Preliminary result)   Collection Time: 03/24/21  9:09 PM   Specimen: BLOOD  Result Value Ref Range Status   Specimen Description BLOOD BLOOD LEFT WRIST  Final   Special Requests   Final    BOTTLES DRAWN AEROBIC AND ANAEROBIC Blood Culture results may not be optimal due to an inadequate volume of blood received in culture bottles   Culture   Final    NO GROWTH 3 DAYS Performed at [facility], [address], [place], [place] [address]    Report Status PENDING  Incomplete  Body fluid culture w Gram Stain     Status: None (Preliminary result)   Collection Time: 03/25/21  1:52 PM   Specimen: BILE; Body Fluid  Result Value Ref Range Status   Specimen Description   Final    BILE Performed at [facility], [address], [place], [place] [address]    Special Requests   Final    Normal Performed at [facility], [address] [place], [place] [address]    Gram Stain NO WBC SEEN NO ORGANISMS SEEN   Final   Culture   Final    NO GROWTH 2 DAYS Performed at [facility], [place] [address], [place], [place] [address]    Report Status PENDING  Incomplete     Time coordinating discharge: Over 30 minutes  SIGNED:   Darliss Cheney, MD  [facility] 03/27/2021, 9:24  AM  If 7PM-7AM, please contact night-coverage [email or web address]

## 2021-03-27 NOTE — Progress Notes (Signed)
Mobility Specialist - Progress Note   03/27/21 1135  Mobility  Activity Ambulated in hall  Level of Assistance Modified independent, requires aide device or extra time  Assistive Device Front wheel walker  Distance Ambulated (ft) 520 ft  Mobility Ambulated independently in hallway  Mobility Response Tolerated well  Mobility performed by Mobility specialist  $Mobility charge 1 Mobility    Pre-mobility: 71 HR, 94% SpO2   Pt sitting in recliner on arrival. No pain or fatigue. Pt ambulated in hallway with RW. No LOB. ModI with energy conservation techniques implemented and educated, as pt states she feels like she runs out of energy completing tasks at times. Mild SOB on RA. Unable to determine VS via pulse ox d/t poor pleth. PLB engaged. Pt ambulated >500' with self-determined stopping point. Upon return to room, pt states "my legs are tired, not me, just my legs". Pt left in recliner with needs in reach. Anticipating d/c later this date.    [name] Mobility Specialist 03/27/21, 12:04 PM

## 2021-03-27 NOTE — Progress Notes (Signed)
Pt discharged home with her daughter.  All discharge instructions explained and questions answered.  Pt given instructions on how to flush cholecystostomy and empty the bag. VSS and pt without complaints. Pt left the unit via wheelchair.

## 2021-03-28 DIAGNOSIS — K8001 Calculus of gallbladder with acute cholecystitis with obstruction: Secondary | ICD-10-CM | POA: Diagnosis not present

## 2021-03-28 DIAGNOSIS — Z4803 Encounter for change or removal of drains: Secondary | ICD-10-CM | POA: Diagnosis not present

## 2021-03-28 DIAGNOSIS — E1122 Type 2 diabetes mellitus with diabetic chronic kidney disease: Secondary | ICD-10-CM | POA: Diagnosis not present

## 2021-03-28 DIAGNOSIS — Z9221 Personal history of antineoplastic chemotherapy: Secondary | ICD-10-CM | POA: Diagnosis not present

## 2021-03-28 DIAGNOSIS — E785 Hyperlipidemia, unspecified: Secondary | ICD-10-CM | POA: Diagnosis not present

## 2021-03-28 DIAGNOSIS — M1710 Unilateral primary osteoarthritis, unspecified knee: Secondary | ICD-10-CM | POA: Diagnosis not present

## 2021-03-28 DIAGNOSIS — Z8719 Personal history of other diseases of the digestive system: Secondary | ICD-10-CM | POA: Diagnosis not present

## 2021-03-28 DIAGNOSIS — I129 Hypertensive chronic kidney disease with stage 1 through stage 4 chronic kidney disease, or unspecified chronic kidney disease: Secondary | ICD-10-CM | POA: Diagnosis not present

## 2021-03-28 DIAGNOSIS — Z85828 Personal history of other malignant neoplasm of skin: Secondary | ICD-10-CM | POA: Diagnosis not present

## 2021-03-28 DIAGNOSIS — Z85038 Personal history of other malignant neoplasm of large intestine: Secondary | ICD-10-CM | POA: Diagnosis not present

## 2021-03-28 DIAGNOSIS — E871 Hypo-osmolality and hyponatremia: Secondary | ICD-10-CM | POA: Diagnosis not present

## 2021-03-28 DIAGNOSIS — N1831 Chronic kidney disease, stage 3a: Secondary | ICD-10-CM | POA: Diagnosis not present

## 2021-03-28 DIAGNOSIS — E559 Vitamin D deficiency, unspecified: Secondary | ICD-10-CM | POA: Diagnosis not present

## 2021-03-28 DIAGNOSIS — Z923 Personal history of irradiation: Secondary | ICD-10-CM | POA: Diagnosis not present

## 2021-03-28 DIAGNOSIS — Z86010 Personal history of colonic polyps: Secondary | ICD-10-CM | POA: Diagnosis not present

## 2021-03-28 DIAGNOSIS — G40909 Epilepsy, unspecified, not intractable, without status epilepticus: Secondary | ICD-10-CM | POA: Diagnosis not present

## 2021-03-29 ENCOUNTER — Telehealth: Payer: Self-pay

## 2021-03-29 LAB — BODY FLUID CULTURE W GRAM STAIN
Culture: NO GROWTH
Gram Stain: NONE SEEN
Special Requests: NORMAL

## 2021-03-29 LAB — CULTURE, BLOOD (ROUTINE X 2)
Culture: NO GROWTH
Culture: NO GROWTH
Special Requests: ADEQUATE

## 2021-03-29 NOTE — Telephone Encounter (Cosign Needed)
Transition Care Management Follow-up Telephone Call  Date of discharge and from where: 03/27/21 [facility]  How have you been since you were released from the hospital? Pt states she is doing okay, still feeling sore. Pt is able to eat, drink and have normal bowel movements.   Any questions or concerns? No  Items Reviewed:  Did the pt receive and understand the discharge instructions provided? Yes   Medications obtained and verified? Yes   Other? No   Any new allergies since your discharge? No   Dietary orders reviewed? Yes  Do you have support at home? Yes   Home Care and Equipment/Supplies: Were home health services ordered? yes If so, what is the name of the agency? [facility]  Has the agency set up a time to come to the patient's home? yes Were any new equipment or medical supplies ordered?  Yes: drain supplies What is the name of the medical supply agency? Home health providing Were you able to get the supplies/equipment? yes Do you have any questions related to the use of the equipment or supplies? No  Functional Questionnaire: (I = Independent and D = Dependent) ADLs: I  Bathing/Dressing- I - with assistance  Meal Prep- I  Eating- I  Maintaining continence- I  Transferring/Ambulation- I  Managing Meds- I  Follow up appointments reviewed:   PCP Hospital f/u appt confirmed? No  pt awaiting appt if indicated.   [facility] f/u appt confirmed? Yes  Scheduled to see Dr. Dahlia Byes on 04/17/21.  Are transportation arrangements needed? No   If their condition worsens, is the pt aware to call PCP or go to the Emergency Dept.? Yes  Was the patient provided with contact information for the PCP's office or ED? Yes  Was to pt encouraged to call back with questions or concerns? Yes

## 2021-03-31 ENCOUNTER — Other Ambulatory Visit: Payer: Self-pay

## 2021-03-31 ENCOUNTER — Emergency Department
Admission: EM | Admit: 2021-03-31 | Discharge: 2021-03-31 | Disposition: A | Discharge status: Home / Self Care | Payer: Medicare Other | Attending: Emergency Medicine | Admitting: Emergency Medicine

## 2021-03-31 ENCOUNTER — Emergency Department: Payer: Medicare Other

## 2021-03-31 DIAGNOSIS — Z79899 Other long term (current) drug therapy: Secondary | ICD-10-CM | POA: Insufficient documentation

## 2021-03-31 DIAGNOSIS — Z7984 Long term (current) use of oral hypoglycemic drugs: Secondary | ICD-10-CM | POA: Insufficient documentation

## 2021-03-31 DIAGNOSIS — Z7982 Long term (current) use of aspirin: Secondary | ICD-10-CM | POA: Diagnosis not present

## 2021-03-31 DIAGNOSIS — R0789 Other chest pain: Secondary | ICD-10-CM

## 2021-03-31 DIAGNOSIS — K828 Other specified diseases of gallbladder: Secondary | ICD-10-CM | POA: Diagnosis not present

## 2021-03-31 DIAGNOSIS — D171 Benign lipomatous neoplasm of skin and subcutaneous tissue of trunk: Secondary | ICD-10-CM | POA: Diagnosis not present

## 2021-03-31 DIAGNOSIS — Z85048 Personal history of other malignant neoplasm of rectum, rectosigmoid junction, and anus: Secondary | ICD-10-CM | POA: Insufficient documentation

## 2021-03-31 DIAGNOSIS — D259 Leiomyoma of uterus, unspecified: Secondary | ICD-10-CM | POA: Diagnosis not present

## 2021-03-31 DIAGNOSIS — I10 Essential (primary) hypertension: Secondary | ICD-10-CM | POA: Insufficient documentation

## 2021-03-31 DIAGNOSIS — Z794 Long term (current) use of insulin: Secondary | ICD-10-CM | POA: Insufficient documentation

## 2021-03-31 DIAGNOSIS — R109 Unspecified abdominal pain: Secondary | ICD-10-CM

## 2021-03-31 DIAGNOSIS — G8918 Other acute postprocedural pain: Secondary | ICD-10-CM | POA: Diagnosis not present

## 2021-03-31 DIAGNOSIS — E119 Type 2 diabetes mellitus without complications: Secondary | ICD-10-CM | POA: Diagnosis not present

## 2021-03-31 DIAGNOSIS — Z85038 Personal history of other malignant neoplasm of large intestine: Secondary | ICD-10-CM | POA: Diagnosis not present

## 2021-03-31 DIAGNOSIS — R1011 Right upper quadrant pain: Secondary | ICD-10-CM | POA: Diagnosis not present

## 2021-03-31 DIAGNOSIS — K8020 Calculus of gallbladder without cholecystitis without obstruction: Secondary | ICD-10-CM | POA: Diagnosis not present

## 2021-03-31 LAB — CBC WITH DIFFERENTIAL/PLATELET
Abs Immature Granulocytes: 0.05 10*3/uL (ref 0.00–0.07)
Basophils Absolute: 0 10*3/uL (ref 0.0–0.1)
Basophils Relative: 0 %
Eosinophils Absolute: 0.1 10*3/uL (ref 0.0–0.5)
Eosinophils Relative: 1 %
HCT: 31.2 % — ABNORMAL LOW (ref 36.0–46.0)
Hemoglobin: 10.1 g/dL — ABNORMAL LOW (ref 12.0–15.0)
Immature Granulocytes: 0 %
Lymphocytes Relative: 7 %
Lymphs Abs: 0.9 10*3/uL (ref 0.7–4.0)
MCH: 28.6 pg (ref 26.0–34.0)
MCHC: 32.4 g/dL (ref 30.0–36.0)
MCV: 88.4 fL (ref 80.0–100.0)
Monocytes Absolute: 1.1 10*3/uL — ABNORMAL HIGH (ref 0.1–1.0)
Monocytes Relative: 9 %
Neutro Abs: 10.4 10*3/uL — ABNORMAL HIGH (ref 1.7–7.7)
Neutrophils Relative %: 83 %
Platelets: 369 10*3/uL (ref 150–400)
RBC: 3.53 MIL/uL — ABNORMAL LOW (ref 3.87–5.11)
RDW: 13.7 % (ref 11.5–15.5)
WBC: 12.5 10*3/uL — ABNORMAL HIGH (ref 4.0–10.5)
nRBC: 0 % (ref 0.0–0.2)

## 2021-03-31 LAB — COMPREHENSIVE METABOLIC PANEL
ALT: 18 U/L (ref 0–44)
AST: 20 U/L (ref 15–41)
Albumin: 3.1 g/dL — ABNORMAL LOW (ref 3.5–5.0)
Alkaline Phosphatase: 102 U/L (ref 38–126)
Anion gap: 11 (ref 5–15)
BUN: 8 mg/dL (ref 8–23)
CO2: 25 mmol/L (ref 22–32)
Calcium: 9 mg/dL (ref 8.9–10.3)
Chloride: 96 mmol/L — ABNORMAL LOW (ref 98–111)
Creatinine, Ser: 0.93 mg/dL (ref 0.44–1.00)
GFR, Estimated: >60 mL/min (ref >60)
Glucose, Bld: 82 mg/dL (ref 70–99)
Potassium: 3.8 mmol/L (ref 3.5–5.1)
Sodium: 132 mmol/L — ABNORMAL LOW (ref 135–145)
Total Bilirubin: 0.5 mg/dL (ref 0.3–1.2)
Total Protein: 6.3 g/dL — ABNORMAL LOW (ref 6.5–8.1)

## 2021-03-31 LAB — TROPONIN I (HIGH SENSITIVITY): Troponin I (High Sensitivity): 7 ng/L (ref <18)

## 2021-03-31 LAB — LIPASE, BLOOD: Lipase: 40 U/L (ref 11–51)

## 2021-03-31 LAB — URINALYSIS, COMPLETE (UACMP) WITH MICROSCOPIC
Bilirubin Urine: NEGATIVE
Glucose, UA: NEGATIVE mg/dL
Ketones, ur: NEGATIVE mg/dL
Leukocytes,Ua: NEGATIVE
Nitrite: NEGATIVE
Protein, ur: NEGATIVE mg/dL
Specific Gravity, Urine: 1.002 — ABNORMAL LOW (ref 1.005–1.030)
pH: 7 (ref 5.0–8.0)

## 2021-03-31 MED ORDER — ONDANSETRON HCL 4 MG/2ML IJ SOLN
4.0000 mg | Freq: Once | INTRAMUSCULAR | Status: AC
  Administered 2021-03-31: 4 mg via INTRAVENOUS
  Filled 2021-03-31: qty 2

## 2021-03-31 MED ORDER — ONDANSETRON HCL 4 MG PO TABS: 4.0000 mg | ORAL_TABLET | Freq: Three times a day (TID) | ORAL | 0 refills | Status: DC | PRN

## 2021-03-31 MED ORDER — TRAMADOL HCL 50 MG PO TABS
25.0000 mg | ORAL_TABLET | Freq: Once | ORAL | Status: AC
Start: 2021-03-31 — End: 2021-03-31
  Administered 2021-03-31: 25 mg via ORAL
  Filled 2021-03-31: qty 1

## 2021-03-31 MED ORDER — LIDOCAINE VISCOUS HCL 2 % MT SOLN
15.0000 mL | Freq: Once | OROMUCOSAL | Status: AC
  Administered 2021-03-31: 15 mL via ORAL
  Filled 2021-03-31: qty 15

## 2021-03-31 MED ORDER — PANTOPRAZOLE SODIUM 40 MG PO TBEC: 40.0000 mg | DELAYED_RELEASE_TABLET | Freq: Every day | ORAL | 0 refills | Status: AC | PRN

## 2021-03-31 MED ORDER — TRAMADOL HCL 50 MG PO TABS: 25.0000 mg | ORAL_TABLET | Freq: Four times a day (QID) | ORAL | 0 refills | Status: DC | PRN

## 2021-03-31 MED ORDER — PROBIOTIC 1-250 BILLION-MG PO CAPS: 1.0000 | ORAL_CAPSULE | Freq: Every day | ORAL | 0 refills | Status: AC

## 2021-03-31 MED ORDER — ACETAMINOPHEN 500 MG PO TABS: 1000.0000 mg | ORAL_TABLET | Freq: Four times a day (QID) | ORAL | 0 refills | Status: DC | PRN

## 2021-03-31 MED ORDER — LACTATED RINGERS IV SOLN: INTRAVENOUS | Status: DC

## 2021-03-31 MED ORDER — PANTOPRAZOLE SODIUM 40 MG IV SOLR
40.0000 mg | Freq: Once | INTRAVENOUS | Status: AC
  Administered 2021-03-31: 40 mg via INTRAVENOUS
  Filled 2021-03-31: qty 40

## 2021-03-31 MED ORDER — FENTANYL CITRATE (PF) 100 MCG/2ML IJ SOLN
50.0000 ug | Freq: Once | INTRAMUSCULAR | Status: AC
Start: 2021-03-31 — End: 2021-03-31
  Administered 2021-03-31: 50 ug via INTRAVENOUS
  Filled 2021-03-31: qty 2

## 2021-03-31 MED ORDER — ALUM & MAG HYDROXIDE-SIMETH 200-200-20 MG/5ML PO SUSP
30.0000 mL | Freq: Once | ORAL | Status: AC
  Administered 2021-03-31: 30 mL via ORAL
  Filled 2021-03-31: qty 30

## 2021-03-31 MED ORDER — ACETAMINOPHEN 500 MG PO TABS
1000.0000 mg | ORAL_TABLET | Freq: Once | ORAL | Status: AC
  Administered 2021-03-31: 1000 mg via ORAL
  Filled 2021-03-31: qty 2

## 2021-03-31 NOTE — ED Provider Notes (Signed)
[facility] Emergency Department Provider Note  ____________________________________________   Event Date/Time   First MD Initiated Contact with Patient 03/31/21 0203     (approximate)  I have reviewed the triage vital signs and the nursing notes.   HISTORY  Chief Complaint Abdominal Pain    HPI [name] is a 75 y.o. female with history of hypertension, diabetes, hyperlipidemia, kidney disease, seizures, colon cancer status postresection, bowel obstruction who presents to the emergency department with burning, severe right-sided abdominal pain.  Patient was admitted to the hospital on 03/24/2021 - 03/27/2021 for altered mental status, hyponatremia, cholecystitis.  Patient underwent CT-guided cholecystostomy by IR on 03/25/2021.  Discharged on Augmentin.  States she was not discharged with any pain medication.  She has not even been taking Tylenol at home as she is worried that this could "burn a hole in my stomach".  She has had nausea and diarrhea.  No vomiting.  No chest pain or shortness of breath.  States that her cholecystostomy has not been draining as much today.  She states that she had this same pain while she was in the hospital but states tonight it is more severe.        Past Medical History:  Diagnosis Date  . Allergy   . Arthritis of knee, degenerative 05/08/2015  . Basal cell carcinoma 03/2019   nose  . Colon cancer ([place]) 2014   Partial colon resection, chemo + rad tx's.   . Diabetes mellitus without complication ([place])    Pt takes Metformin.  Marland Kitchen Hyperlipidemia   . Hypertension   . SBO (small bowel obstruction) ([place]) 04/20/2018  . Seizures ([place])   . Umbilical hernia with obstruction but no gangrene   . Vitamin D deficiency     Patient Active Problem List   Diagnosis Date Noted  . Acute cholecystitis 03/24/2021  . Mood disorder ([place]) 12/21/2019  . 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 Bilateral carotid artery stenosis  08/24/2018  . Atherosclerosis of aorta ([place]) 08/24/2018  . Type II diabetes mellitus with complication ([place]) [id number]  . Bradycardia 07/24/2018  . Neoplasm of uncertain behavior of skin 01/16/2017  . 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 Essential (primary) hypertension 05/08/2015  . History of rectal cancer 05/08/2015  . Hyperlipidemia associated with type 2 diabetes mellitus ([place]) 05/08/2015  . Seizure disorder ([place]) 05/08/2015  . Shoulder strain 05/08/2015  . Avitaminosis D 05/08/2015    Past Surgical History:  Procedure Laterality Date  . CATARACT EXTRACTION, BILATERAL  01/2020   Dr. Theresia Lo  . COLOSTOMY REVERSAL  01/2014  . ILEOSTOMY  05/2013  . UMBILICAL HERNIA REPAIR  05/2018    Prior to Admission medications   Medication Sig Start Date End Date Taking? Authorizing Provider  acetaminophen (TYLENOL) 500 MG tablet Take 2 tablets (1,000 mg total) by mouth every 6 (six) hours as needed. 03/31/21 03/31/22 Yes Thales Knipple, Delice Bison, DO  Bacillus Coagulans-Inulin (PROBIOTIC) 1-250 BILLION-MG CAPS Take 1 capsule by mouth daily. 03/31/21  Yes Diani Jillson, Cyril Mourning N, DO  ondansetron (ZOFRAN) 4 MG tablet Take 1 tablet (4 mg total) by mouth every 8 (eight) hours as needed for nausea or vomiting. 03/31/21  Yes Novis League, Cyril Mourning N, DO  pantoprazole (PROTONIX) 40 MG tablet Take 1 tablet (40 mg total) by mouth daily as needed (heartburn). 03/31/21 03/31/22 Yes Slade Pierpoint, Delice Bison, DO  traMADol (ULTRAM) 50 MG tablet Take 0.5 tablets (25 mg total) by mouth every 6 (six) hours as needed. 03/31/21 03/31/22  Yes Ashonti Leandro N, DO  amoxicillin-clavulanate (AUGMENTIN) 875-125 MG tablet Take 1 tablet by mouth 2 (two) times daily for 10 days. 03/27/21 04/06/21  Darliss Cheney, MD  aspirin 81 MG chewable tablet Chew 81 mg by mouth daily.     [provider]  atorvastatin (LIPITOR) 40 MG tablet TAKE 1 TABLET BY MOUTH DAILY 02/14/21   Glean Hess, MD  calcium carbonate (OSCAL) 1500 (600 Ca) MG TABS tablet Take 600 mg of  elemental calcium by mouth daily.    [provider]  carbamazepine (TEGRETOL) 200 MG tablet TAKE 1 TABLET(200 MG) BY MOUTH DAILY 08/06/20   Glean Hess, MD  Cholecalciferol 25 MCG (1000 UT) capsule Take 1,000 Units by mouth daily.  09/22/14   [provider]  Fluocinolone Acetonide 0.01 % OIL INSTILL 4 DROPS INTO BOTH EARS THREE TIMES DAILY AS NEEDED FOR DRYNESS AND ITCHING 12/31/18   [provider]  hydrALAZINE (APRESOLINE) 25 MG tablet Take 1 tablet (25 mg total) by mouth 2 (two) times daily. 10/02/20   Glean Hess, MD  hydrochlorothiazide (MICROZIDE) 12.5 MG capsule TAKE 1 CAPSULE(12.5 MG) BY MOUTH DAILY 11/03/20   Glean Hess, MD  insulin glargine (LANTUS SOLOSTAR) 100 UNIT/ML Solostar Pen Inject 20-25 Units into the skin daily. Patient taking differently: Inject 18-20 Units into the skin daily. 04/25/20   Glean Hess, MD  meloxicam (MOBIC) 15 MG tablet Take 15 mg by mouth as needed for pain.    [provider]  metFORMIN (GLUCOPHAGE-XR) 500 MG 24 hr tablet TAKE 2 TABLETS BY MOUTH 2 TIMES DAILY 12/25/20   Glean Hess, MD  metoprolol succinate (TOPROL-XL) 25 MG 24 hr tablet Take 0.5 tablets (12.5 mg total) by mouth in the morning and at bedtime. 08/23/20 08/23/21  Glean Hess, MD  nitroGLYCERIN (NITROSTAT) 0.4 MG SL tablet Place 1 tablet (0.4 mg total) under the tongue every 5 (five) minutes x 3 doses as needed for chest pain. 07/26/18   Demetrios Loll, MD  [facility] ULTRA test strip USE 1 STRIP TO CHECK GLUCOSE TWICE DAILY 12/17/20   Glean Hess, MD  Propylene Glycol (SYSTANE BALANCE OP) Apply to eye.    [provider]  ramipril (ALTACE) 5 MG capsule TAKE 1 CAPSULE BY MOUTH DAILY 11/12/20   Glean Hess, MD    Allergies Baclofen and Pioglitazone  Family History  Problem Relation Age of Onset  . Cancer Mother 54       unknown type  . Diabetes Daughter   . Diabetes Son     Social History Social History    Tobacco Use  . Smoking status: Never Smoker  . Smokeless tobacco: Never Used  Vaping Use  . Vaping Use: Never used  Substance Use Topics  . Alcohol use: No    Alcohol/week: 0.0 standard drinks  . Drug use: No    Review of Systems Constitutional: No fever. Eyes: No visual changes. ENT: No sore throat. Cardiovascular: Denies chest pain. Respiratory: Denies shortness of breath. Gastrointestinal: No nausea, vomiting, diarrhea. Genitourinary: Negative for dysuria. Musculoskeletal: Negative for back pain. Skin: Negative for rash. Neurological: Negative for focal weakness or numbness.  ____________________________________________   PHYSICAL EXAM:  VITAL SIGNS: ED Triage Vitals  Enc Vitals Group     BP 03/31/21 0153 127/65     Pulse Rate 03/31/21 0153 75     Resp 03/31/21 0153 14     Temp 03/31/21 0153 98.2 F (36.8 C)  Temp Source 03/31/21 0153 Oral     SpO2 03/31/21 0153 97 %     Weight 03/31/21 0151 155 lb 3.3 oz (70.4 kg)     Height 03/31/21 0151 5\' 2"  (1.575 m)     Head Circumference --      Peak Flow --      Pain Score 03/31/21 0151 10     Pain Loc --      Pain Edu? --      Excl. in [place]? --    CONSTITUTIONAL: Alert and oriented and responds appropriately to questions.  Elderly, anxious HEAD: Normocephalic EYES: Conjunctivae clear, pupils appear equal, EOM appear intact ENT: normal nose; moist mucous membranes NECK: Supple, normal ROM CARD: RRR; S1 and S2 appreciated; no murmurs, no clicks, no rubs, no gallops RESP: Normal chest excursion without splinting or tachypnea; breath sounds clear and equal bilaterally; no wheezes, no rhonchi, no rales, no hypoxia or respiratory distress, speaking full sentences ABD/GI: Normal bowel sounds; non-distended; soft, diffusely tender throughout the right abdomen without guarding or rebound, patient has a cholecystostomy tube noted in the right upper quadrant that appears in place with intact sutures and no surrounding  redness, warmth, bleeding, drainage.  There is a small amount of bilious liquid in her cholecystostomy bag. BACK: The back appears normal EXT: Normal ROM in all joints; no deformity noted, no edema; no cyanosis SKIN: Normal color for age and race; warm; no rash on exposed skin NEURO: Moves all extremities equally PSYCH: The patient's mood and manner are appropriate.  ____________________________________________   LABS (all labs ordered are listed, but only abnormal results are displayed)  Labs Reviewed  CBC WITH DIFFERENTIAL/PLATELET - Abnormal; Notable for the following components:      Result Value   WBC 12.5 (*)    RBC 3.53 (*)    Hemoglobin 10.1 (*)    HCT 31.2 (*)    Neutro Abs 10.4 (*)    Monocytes Absolute 1.1 (*)    All other components within normal limits  COMPREHENSIVE METABOLIC PANEL - Abnormal; Notable for the following components:   Sodium 132 (*)    Chloride 96 (*)    Total Protein 6.3 (*)    Albumin 3.1 (*)    All other components within normal limits  URINALYSIS, COMPLETE (UACMP) WITH MICROSCOPIC - Abnormal; Notable for the following components:   Color, Urine STRAW (*)    APPearance HAZY (*)    Specific Gravity, Urine 1.002 (*)    Hgb urine dipstick SMALL (*)    Bacteria, UA RARE (*)    All other components within normal limits  LIPASE, BLOOD  TROPONIN I (HIGH SENSITIVITY)   ____________________________________________  EKG   ____________________________________________  RADIOLOGY I, Autymn Omlor, personally viewed and evaluated these images (plain radiographs) as part of my medical decision making, as well as reviewing the written report by the radiologist.  ED MD interpretation: No acute abnormality noted on CT of the abdomen pelvis.  Official radiology report(s): CT ABDOMEN PELVIS WO CONTRAST  Result Date: 03/31/2021 CLINICAL DATA:  Right-sided abdominal pain. Recent cholecystostomy tube placement. EXAM: CT ABDOMEN AND PELVIS WITHOUT CONTRAST  TECHNIQUE: Multidetector CT imaging of the abdomen and pelvis was performed following the standard protocol without IV contrast. COMPARISON:  Abdominal CT 03/24/2021 FINDINGS: Lower chest: Mild linear atelectasis in the lower lobes. No pleural fluid. Coronary artery calcifications. Hepatobiliary: Cholecystostomy tube is coiled in the gallbladder. No fluid or inflammatory change along the course of the tubing. Slight decreased gallbladder  distension from prior exam. Calcified gallstone again seen. Improved pericholecystic inflammation with minimal residual fat stranding about the fundus. No biliary dilatation. No focal hepatic abnormality on this noncontrast exam. Pancreas: Parenchymal atrophy. No ductal dilatation or inflammation. Spleen: Normal in size without focal abnormality. Adrenals/Urinary Tract: Normal adrenal glands. No hydronephrosis. Bilateral extrarenal pelvis configuration is seen on prior exam. Symmetric bilateral perinephric edema is unchanged. No renal calculi or suspicious renal lesion. Renal hilar vascular calcifications are seen. Unremarkable urinary bladder. Stomach/Bowel: Decompressed stomach. Surgical clips adjacent to the greater curvature unchanged. Small duodenal lipoma, nonobstructive and unchanged. No small bowel obstruction or inflammation. Enteric sutures noted in the distal sigmoid colon. Moderate colonic stool burden. Normal appendix. Vascular/Lymphatic: Advanced aortic and branch atherosclerosis. No abdominopelvic adenopathy. Reproductive: Calcified uterine fibroids.  No adnexal mass. Other: No ascites. No fluid collection related to cholecystostomy tube. No free air. No abdominal wall hernia. Postsurgical changes the anterior abdominal wall. Musculoskeletal: Lumbar spine degenerative change. Mild bilateral hip osteoarthritis. There are no acute or suspicious osseous abnormalities. IMPRESSION: 1. Cholecystostomy tube is coiled in the gallbladder. Slight decreased gallbladder  distension from prior exam. Improved pericholecystic inflammation with minimal residual fat stranding about the fundus. No fluid collection or complication related to cholecystostomy tube. 2. No other acute findings in the abdomen/pelvis. 3. Calcified uterine fibroids. Aortic Atherosclerosis (ICD10-I70.0). Electronically Signed   By: Narda Rutherford M.D.   On: 03/31/2021 02:40    ____________________________________________   PROCEDURES  Procedure(s) performed (including Critical Care):  Procedures   ____________________________________________   INITIAL IMPRESSION / ASSESSMENT AND PLAN / ED COURSE  As part of my medical decision making, I reviewed the following data within the electronic MEDICAL RECORD NUMBER History obtained from family, Nursing notes reviewed and incorporated, Labs reviewed , Old chart reviewed, Radiograph reviewed , Notes from prior ED visits and [place] Controlled Substance Database         Patient here with complaints of right-sided abdominal pain.  Recently admitted to the hospital for cholecystitis and had a cholecystostomy tube placed and is on Augmentin.  Will obtain labs, urine, CT of the abdomen pelvis to evaluate placement of the cholecystostomy tube and for any worsening infection/inflammation, development of abscess, bowel obstruction, appendicitis, pancreatitis, choledocholithiasis, cholangitis, UTI, pyelonephritis.  Patient is very reluctant to take pain medication as she states that narcotics cause her to be altered.  After lengthy discussion patient finally agrees to fentanyl.  ED PROGRESS  Patient reports pain improved after fentanyl but not completely gone.  Labs today showed leukocytosis 12,000 with left shift.  Sodium is normal at 132.  Normal creatinine, LFTs, lipase.  Urine shows no sign of infection.  CT scan shows that her cholecystostomy tube is coiled in the gallbladder.  There is decreased gallbladder distention and improved pericholecystic  inflammation with minimal residual fat stranding around the fundus.  There is no fluid collection or complication related to this cholecystostomy tube.  There are no other acute findings within the abdomen and pelvis.  She has a normal-appearing appendix and no sign of bowel obstruction.  Pancreas appears normal and there is no ductal dilatation seen.  No kidney stone, hydronephrosis, perinephric stranding.  She agrees to try half a dose of tramadol and Tylenol here for further pain control.   Nurse reports that she is complaining of belching and burning.  Will give GI cocktail, Protonix and reassess.  Patient denies any current chest pain or shortness of breath but states she has had some chest tightness.  Her  EKG is nonischemic.  We will add on a troponin.  Daughter is eager to take patient home.  Patient states after lengthy conversation that she feels her pain is improved and she would like to discharge.  We did discuss the option of readmission for further pain control but she declines this.  Daughter is also not interested in patient going to a rehab facility.  She states that the patient stays with her and she feels comfortable managing her at home.   7:17 AM  Pt reports her pain is controlled.  Tolerating p.o.  Will discharge with prescriptions for Protonix, probiotics, tramadol, Tylenol, Zofran.  We discussed that she may use over-the-counter Mylanta, Maalox as needed for burning.  Discussed return precautions.  She has outpatient follow-up scheduled.  Have encouraged her to continue her antibiotics until complete.  Recommended Imodium as needed for diarrhea.  She has only had 1 small episode of diarrhea here.  I do not think this is C. difficile.  I suspect that her episodes of chest tightness are related to her acid reflux and daughter agrees.  She states she was complaining of this before she was ever admitted to the hospital the first time.   At this time, I do not feel there is any  life-threatening condition present. I have reviewed, interpreted and discussed all results (EKG, imaging, lab, urine as appropriate) and exam findings with patient/family. I have reviewed nursing notes and appropriate previous records.  I feel the patient is safe to be discharged home without further emergent workup and can continue workup as an outpatient as needed. Discussed usual and customary return precautions. Patient/family verbalize understanding and are comfortable with this plan.  Outpatient follow-up has been provided as needed. All questions have been answered.  ____________________________________________   FINAL CLINICAL IMPRESSION(S) / ED DIAGNOSES  Final diagnoses:  Right sided abdominal pain  Post-op pain  Atypical chest pain     ED Discharge Orders         Ordered    acetaminophen (TYLENOL) 500 MG tablet  Every 6 hours PRN        03/31/21 0709    traMADol (ULTRAM) 50 MG tablet  Every 6 hours PRN        03/31/21 0709    ondansetron (ZOFRAN) 4 MG tablet  Every 8 hours PRN        03/31/21 0709    Bacillus Coagulans-Inulin (PROBIOTIC) 1-250 BILLION-MG CAPS  Daily        03/31/21 0709    pantoprazole (PROTONIX) 40 MG tablet  Daily PRN        03/31/21 5956          *Please note:  [name] was evaluated in Emergency Department on 03/31/2021 for the symptoms described in the history of present illness. She was evaluated in the context of the global COVID-19 pandemic, which necessitated consideration that the patient might be at risk for infection with the SARS-CoV-2 virus that causes COVID-19. Institutional protocols and algorithms that pertain to the evaluation of patients at risk for COVID-19 are in a state of rapid change based on information released by regulatory bodies including the CDC and federal and state organizations. These policies and algorithms were followed during the patient's care in the ED.  Some ED evaluations and interventions may be delayed as a  result of limited staffing during and the pandemic.*   Note:  This document was prepared using Dragon voice recognition software and may include unintentional dictation errors.  Elizjah Noblet, Delice Bison, DO 03/31/21 [phone]

## 2021-03-31 NOTE — ED Notes (Signed)
Pt reports burning to R anterior abdomen and to back since arrival - now reporting that it feels "like I need to burp" - Dr Leonides Schanz notified - GI cocktail to follow per orders

## 2021-03-31 NOTE — ED Notes (Signed)
ED Provider at bedside.  Pt oob to in room commode - UA pending

## 2021-03-31 NOTE — ED Notes (Signed)
ED Provider at bedside. 

## 2021-03-31 NOTE — ED Notes (Signed)
Patient transported to CT 

## 2021-03-31 NOTE — Discharge Instructions (Addendum)
You are being provided a prescription for opiates (also known as narcotics) for pain control.  Opiates can be addictive and should only be used when absolutely necessary for pain control when other alternatives do not work.  We recommend you only use them for the recommended amount of time and only as prescribed.  Please do not take with other sedative medications or alcohol.  Please do not drive, operate machinery, make important decisions while taking opiates.  Please note that these medications can be addictive and have high abuse potential.  Patients can become addicted to narcotics after only taking them for a few days.  Please keep these medications locked away from children, teenagers or any family members with history of substance abuse.  Narcotic pain medicine may also make you constipated.  You may use over-the-counter medications such as MiraLAX, Colace to prevent constipation.  If you become constipated you may use over-the-counter enemas as needed.  Itching and nausea are common side effects of narcotic pain medication.  If you develop uncontrolled vomiting or a rash, please stop these medications.  Please continue your antibiotics until complete.  It is normal to have diarrhea with Augmentin.  You may begin taking a probiotic as well.  You may use over-the-counter Imodium as needed for diarrhea.   You may use over-the-counter Maalox, Mylanta as needed for burning.  It is also safe to use Tums.  You may take Protonix as needed for your burning pain.  A prescription for this medication was sent to your pharmacy.  You may take Tylenol 1000 mg every 6 hours as needed for pain.  If this is not enough to control your pain, you may take 1/2 to 1 tablet of tramadol (25 to 50 mg) every 8 hours as needed for pain control.  You may take Zofran as needed for nausea.  Please follow-up with surgery as scheduled.  Your labs, urine, cardiac work-up, CT scan were reassuring today.

## 2021-03-31 NOTE — ED Triage Notes (Addendum)
Pt states that home health nurse came today c/o general abd pain and R flank pain since wed after being discharge, states mon had  Gallbladder drain placed for the next 3wks before the gallbladder can be removed, overall has had abd discomfort xwks, oral antibiotics started wed which is increased the decreased appetite and diarrhea (has had 7 doses)

## 2021-04-04 ENCOUNTER — Telehealth: Payer: Self-pay | Admitting: Internal Medicine

## 2021-04-04 NOTE — Telephone Encounter (Signed)
Called to request verbal orders for PT - 1xwk 1, 2xwk 4, 1xwk 3.  If there are any questions, please call [phone]

## 2021-04-04 NOTE — Telephone Encounter (Signed)
Called Jeani Hawking gave her verbal orders. She verbalized understanding.  KP

## 2021-04-08 ENCOUNTER — Telehealth: Payer: Self-pay | Admitting: Internal Medicine

## 2021-04-08 ENCOUNTER — Telehealth: Payer: Self-pay

## 2021-04-08 NOTE — Telephone Encounter (Unsigned)
Copied from [name]. Topic: Quick Communication - Home Health Verbal Orders >> Apr 08, 2021 10:34 AM Yvette Rack wrote: Caller/Agency: Amy with [facility] Callback Number: [phone] Requesting OT/PT/Skilled Nursing/Social Work/Speech Therapy: Speech Therapy evaluation Frequency: evaluation and discuss patient anxiety

## 2021-04-08 NOTE — Telephone Encounter (Signed)
Called Amy left VM giving verbal orders.  KP

## 2021-04-08 NOTE — Telephone Encounter (Signed)
Left message that Randa Evens did not mean to call her.

## 2021-04-08 NOTE — Telephone Encounter (Signed)
Amy called back regarding the patient's anxiety level and wants to know the PCP's plans. Please advise, requesting a call back from office.

## 2021-04-08 NOTE — Telephone Encounter (Signed)
Copied from [place] [id number]. Topic: General - Call Back - No Documentation >> Apr 08, 2021 11:11 AM Yvette Rack wrote: Reason for CRM: Pt stated she had a missed call from the office so she was returning the call. Pt requests call back asap.

## 2021-04-10 NOTE — Telephone Encounter (Signed)
Pt is calling back concerning her anxiety and would like to know if md is going to call something into [facility] [address] in [place] phone number [phone]. Pt also would like to know if her other medication is causing interactions and that could be the reason for anxiety

## 2021-04-12 ENCOUNTER — Other Ambulatory Visit: Payer: Self-pay

## 2021-04-12 ENCOUNTER — Ambulatory Visit (INDEPENDENT_AMBULATORY_CARE_PROVIDER_SITE_OTHER): Payer: Medicare Other | Admitting: Internal Medicine

## 2021-04-12 ENCOUNTER — Encounter: Payer: Self-pay | Admitting: Internal Medicine

## 2021-04-12 VITALS — BP 128/70 | HR 64 | Temp 98.3°F | Ht 62.0 in | Wt 150.0 lb

## 2021-04-12 DIAGNOSIS — E1169 Type 2 diabetes mellitus with other specified complication: Secondary | ICD-10-CM

## 2021-04-12 DIAGNOSIS — I700 Atherosclerosis of aorta: Secondary | ICD-10-CM

## 2021-04-12 DIAGNOSIS — I10 Essential (primary) hypertension: Secondary | ICD-10-CM

## 2021-04-12 DIAGNOSIS — E118 Type 2 diabetes mellitus with unspecified complications: Secondary | ICD-10-CM

## 2021-04-12 DIAGNOSIS — E785 Hyperlipidemia, unspecified: Secondary | ICD-10-CM

## 2021-04-12 DIAGNOSIS — K810 Acute cholecystitis: Secondary | ICD-10-CM

## 2021-04-12 DIAGNOSIS — F39 Unspecified mood [affective] disorder: Secondary | ICD-10-CM | POA: Diagnosis not present

## 2021-04-12 DIAGNOSIS — G40909 Epilepsy, unspecified, not intractable, without status epilepticus: Secondary | ICD-10-CM

## 2021-04-12 MED ORDER — LANTUS SOLOSTAR 100 UNIT/ML ~~LOC~~ SOPN: 20.0000 [IU] | PEN_INJECTOR | Freq: Every day | SUBCUTANEOUS | 1 refills | Status: DC

## 2021-04-12 NOTE — Progress Notes (Signed)
Date:  04/12/2021   Name:  [name]   DOB:  [birth year]   MRN:  [id number]  Pt is accompanied today by her daughter Olivia Mackie.  Chief Complaint: Anxiety (No metformin in a month , tegretol, vit d, asprin only take those on the regular, scared to take some of her meds sometimes, no tramadol for a week)  Diabetes She presents for her follow-up diabetic visit. She has type 2 diabetes mellitus. Her disease course has been fluctuating. Hypoglycemia symptoms include nervousness/anxiousness. Pertinent negatives for hypoglycemia include no dizziness, headaches or seizures. Associated symptoms include weakness. Pertinent negatives for diabetes include no chest pain. Current diabetic treatment includes insulin injections (stopped metformin due to nausea). She is compliant with treatment all of the time. Her breakfast blood glucose is taken between 6-7 am. Her breakfast blood glucose range is generally 110-130 mg/dl. An ACE inhibitor/angiotensin II receptor blocker is being taken (stopped ramipril and then took it with hydralazine and had an episode of passing out.).  Gastroesophageal Reflux She complains of heartburn. She reports no abdominal pain, no chest pain, no coughing, no nausea or no wheezing. The problem occurs occasionally. She has tried a PPI for the symptoms. The treatment provided significant relief.  Hypertension This is a chronic problem. The problem is controlled. Pertinent negatives include no chest pain, headaches, palpitations or shortness of breath. Past treatments include ACE inhibitors, beta blockers, diuretics and direct vasodilators. The current treatment provides significant improvement. Compliance problems: she has been afraid to take all her medication for unclear reasons.   Hyperlipidemia This is a chronic problem. The problem is controlled. Pertinent negatives include no chest pain or shortness of breath. Current antihyperlipidemic treatment includes statins. The current  treatment provides significant improvement of lipids.  Syncope - she had not taken BP meds for a few days then decided she should resume.  She took ramipril and hydralazine.  She began to feel shaky, vision became gray and she was diaphoretic.  She laid down in bed - the time was 11:04.  The next time she was aware it was 11:40.  She did not have any chest pain or SOB.  This was not like her previous seizures.  She is concerned that this was a panic attack. Cholecystitis - hospitalized a month ago with hyponatremia 122, confusion, decreased appetite and was found to have acute calculus cholecystitis. General surgery recommend cholecystostomy drain which was placed. She was treated with 10 days of Augmentin.  The drain is intact, suture clean without s/s infection per daughter, draining small amount of bilious fluid. She will see Gen Surgery next week. Since this time she has been more anxious, not eating well and a bit more confused at times.  She lives with her daughter who is trying to monitor her medications.   Lab Results  Component Value Date   CREATININE 0.93 03/31/2021   BUN 8 03/31/2021   NA 132 (L) 03/31/2021   K 3.8 03/31/2021   CL 96 (L) 03/31/2021   CO2 25 03/31/2021   Lab Results  Component Value Date   CHOL 204 (H) 12/25/2020   HDL 99 12/25/2020   LDLCALC 93 12/25/2020   TRIG 67 12/25/2020   CHOLHDL 2.1 12/25/2020   Lab Results  Component Value Date   TSH 1.710 12/25/2020   Lab Results  Component Value Date   HGBA1C 8.7 (H) 12/25/2020   Lab Results  Component Value Date   WBC 12.5 (H) 03/31/2021   HGB 10.1 (L)  03/31/2021   HCT 31.2 (L) 03/31/2021   MCV 88.4 03/31/2021   PLT 369 03/31/2021   Lab Results  Component Value Date   ALT 18 03/31/2021   AST 20 03/31/2021   ALKPHOS 102 03/31/2021   BILITOT 0.5 03/31/2021     Review of Systems  Constitutional: Positive for appetite change and unexpected weight change (about 5 lbs). Negative for diaphoresis.   Respiratory: Negative for cough, chest tightness, shortness of breath and wheezing.   Cardiovascular: Positive for leg swelling. Negative for chest pain and palpitations.  Gastrointestinal: Positive for heartburn. Negative for abdominal pain, blood in stool, diarrhea, nausea and vomiting.       Stools are small and soft, no bleeding.  Neurological: Positive for weakness. Negative for dizziness, seizures, light-headedness and headaches.  Psychiatric/Behavioral: Positive for dysphoric mood. Negative for sleep disturbance. The patient is nervous/anxious.     Patient Active Problem List   Diagnosis Date Noted  . Acute cholecystitis 03/24/2021  . Mood disorder ([place]) 12/21/2019  . 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 Bilateral carotid artery stenosis 08/24/2018  . Atherosclerosis of aorta ([place]) 08/24/2018  . Type II diabetes mellitus with complication ([place]) 09/81/1914  . Bradycardia 07/24/2018  . Neoplasm of uncertain behavior of skin 01/16/2017  . 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 Essential (primary) hypertension 05/08/2015  . History of rectal cancer 05/08/2015  . Hyperlipidemia associated with type 2 diabetes mellitus ([place]) 05/08/2015  . Seizure disorder ([place]) 05/08/2015  . Shoulder strain 05/08/2015  . Avitaminosis D 05/08/2015    Allergies  Allergen Reactions  . Baclofen Other (See Comments)  . Pioglitazone Nausea Only    Past Surgical History:  Procedure Laterality Date  . CATARACT EXTRACTION, BILATERAL  01/2020   Dr. Theresia Lo  . COLOSTOMY REVERSAL  01/2014  . ILEOSTOMY  05/2013  . UMBILICAL HERNIA REPAIR  05/2018    Social History   Tobacco Use  . Smoking status: Never Smoker  . Smokeless tobacco: Never Used  Vaping Use  . Vaping Use: Never used  Substance Use Topics  . Alcohol use: No    Alcohol/week: 0.0 standard drinks  . Drug use: No     Medication list has been reviewed and updated.  Current Meds  Medication Sig  . acetaminophen (TYLENOL)  500 MG tablet Take 2 tablets (1,000 mg total) by mouth every 6 (six) hours as needed.  Marland Kitchen aspirin 81 MG chewable tablet Chew 81 mg by mouth daily.   Marland Kitchen atorvastatin (LIPITOR) 40 MG tablet TAKE 1 TABLET BY MOUTH DAILY  . Bacillus Coagulans-Inulin (PROBIOTIC) 1-250 BILLION-MG CAPS Take 1 capsule by mouth daily.  . calcium carbonate (OSCAL) 1500 (600 Ca) MG TABS tablet Take 600 mg of elemental calcium by mouth daily.  . carbamazepine (TEGRETOL) 200 MG tablet TAKE 1 TABLET(200 MG) BY MOUTH DAILY  . Cholecalciferol 25 MCG (1000 UT) capsule Take 1,000 Units by mouth daily.   . Fluocinolone Acetonide 0.01 % OIL as needed.  . hydrochlorothiazide (MICROZIDE) 12.5 MG capsule TAKE 1 CAPSULE(12.5 MG) BY MOUTH DAILY  . insulin glargine (LANTUS SOLOSTAR) 100 UNIT/ML Solostar Pen Inject 20-25 Units into the skin daily. (Patient taking differently: Inject 18-20 Units into the skin daily.)  . metFORMIN (GLUCOPHAGE-XR) 500 MG 24 hr tablet TAKE 2 TABLETS BY MOUTH 2 TIMES DAILY  . nitroGLYCERIN (NITROSTAT) 0.4 MG SL tablet Place 1 tablet (0.4 mg total) under the tongue every 5 (five) minutes x 3 doses as needed for chest pain.  Glory Rosebush  ULTRA test strip USE 1 STRIP TO CHECK GLUCOSE TWICE DAILY  . pantoprazole (PROTONIX) 40 MG tablet Take 1 tablet (40 mg total) by mouth daily as needed (heartburn).  . Propylene Glycol (SYSTANE BALANCE OP) Apply to eye.  . ramipril (ALTACE) 5 MG capsule TAKE 1 CAPSULE BY MOUTH DAILY    PHQ 2/9 Scores 04/12/2021 12/25/2020 12/24/2020 08/23/2020  PHQ - 2 Score 4 4 2 4   PHQ- 9 Score 11 6 5 12     GAD 7 : Generalized Anxiety Score 04/12/2021 12/25/2020 08/23/2020 04/23/2020  Nervous, Anxious, on Edge 2 2 2  0  Control/stop worrying 2 2 2  0  Worry too much - different things 2 2 2  0  Trouble relaxing 3 3 1  0  Restless 3 3 1  0  Easily annoyed or irritable 2 0 0 0  Afraid - awful might happen 2 0 0 0  Total GAD 7 Score 16 12 8  0  Anxiety Difficulty - Not difficult at all Not difficult at all  Not difficult at all    BP Readings from Last 3 Encounters:  04/12/21 128/70  03/31/21 (!) 151/54  03/27/21 (!) 127/42    Physical Exam Constitutional:      General: She is not in acute distress.    Appearance: She is not ill-appearing.  Cardiovascular:     Rate and Rhythm: Normal rate and regular rhythm.     Pulses: Normal pulses.     Heart sounds: No murmur heard.   Pulmonary:     Effort: Pulmonary effort is normal.     Breath sounds: No wheezing or rhonchi.  Abdominal:     Palpations: Abdomen is soft.     Tenderness: There is no abdominal tenderness.       Comments: Tube draining bilious fluid No epigastric tenderness  Musculoskeletal:     Cervical back: Normal range of motion.     Right lower leg: No edema.     Left lower leg: No edema.  Lymphadenopathy:     Cervical: No cervical adenopathy.  Skin:    General: Skin is warm and dry.  Neurological:     Mental Status: She is alert.     Wt Readings from Last 3 Encounters:  04/12/21 150 lb (68 kg)  03/31/21 155 lb 3.3 oz (70.4 kg)  03/25/21 155 lb 1.6 oz (70.4 kg)    BP 128/70   Pulse 64   Temp 98.3 F (36.8 C) (Oral)   Ht 5\' 2"  (1.575 m)   Wt 150 lb (68 kg)   SpO2 98%   BMI 27.44 kg/m   Assessment and Plan: 1. Type II diabetes mellitus with complication (HCC) Remain off of metformin for now - causing excessive nausea and abdominal discomfort Continue to check BS and will review at next visit  Consider adding an AM dose of Lantus if needed - insulin glargine (LANTUS SOLOSTAR) 100 UNIT/ML Solostar Pen; Inject 20-25 Units into the skin daily.  Dispense: 15 mL; Refill: 1  2. Essential (primary) hypertension BP is normal today - will d/c HCTZ due to hyponatremia and remain off of hydralazine Continue Ramipril daily.  Resume metoprolol bid after one week Monitor BP at home Will discuss at follow up  3. Mood disorder ([place]) She is having more anxiety related to the drainage tube, uncertainty regarding  surgery, etc I doubt the episode described was a panic attack; I am more concerned about low BP vs cardiac dysrhythmia. Pt will wear her FITbit to help monitor.  4. Acute cholecystitis Continue with drainage tube - seeing Gen Surg next week May shower as long as she covers the drain tube site well  5. Atherosclerosis of aorta (HCC) Continue lipitor and aspirin  6. Hyperlipidemia associated with type 2 diabetes mellitus ([place]) On lipitor  7. Seizure disorder [facility]) She continues to take Tegretol daily May check level next visit   Partially dictated using Dragon software. Any errors are unintentional.  Halina Maidens, MD [facility]  04/12/2021

## 2021-04-12 NOTE — Patient Instructions (Addendum)
Resume Ramipril daily.  After one week, resume metoprolol one half tablet twice a day.  Monitor and record BP daily.  Wear Fit Bit - check pulse and BP if you have another episode.  Especially pay attention to possible atrial fibrillation.  You may shower but no baths.  Be sure to cover the drain site with a water tight dressing.

## 2021-04-17 ENCOUNTER — Telehealth: Payer: Self-pay

## 2021-04-17 ENCOUNTER — Ambulatory Visit: Payer: Medicare Other | Admitting: Surgery

## 2021-04-17 ENCOUNTER — Other Ambulatory Visit: Payer: Self-pay

## 2021-04-17 ENCOUNTER — Encounter: Payer: Self-pay | Admitting: Surgery

## 2021-04-17 VITALS — BP 119/76 | HR 74 | Temp 98.7°F | Ht 62.0 in | Wt 153.2 lb

## 2021-04-17 DIAGNOSIS — K810 Acute cholecystitis: Secondary | ICD-10-CM | POA: Diagnosis not present

## 2021-04-17 NOTE — Patient Instructions (Addendum)
Please get your labs done at [facility]). I left a message with scheduling to call me back to get Cholangiogram scheduled. I will call you with an appointment and a follow up appointment with Dr. Dahlia Byes.        Cholecystitis  Cholecystitis is irritation and swelling (inflammation) of the gallbladder. The gallbladder is an organ that is shaped like a pear. It is under the liver on the right side of the body. This organ stores bile. Bile helps the body break down (digest) the fats in food. This condition can occur all of a sudden. It needs to be treated. What are the causes? This condition may be caused by stones or lumps that form in the gallbladder (gallstones). Gallstones can block the tube (duct) that carries bile out of your gallbladder. Other causes are:  Damage to the gallbladder due to less blood flow.  Germs in the bile ducts.  Scars or kinks in the bile ducts.  Abnormal growths (tumors) in the liver, pancreas, or gallbladder. What increases the risk? You are more likely to develop this condition if:  You have sickle cell disease.  You take birth control pills.  You use estrogen.  You have alcoholic liver disease.  You have liver cirrhosis.  You are being fed through a vein.  You are very ill.  You do not eat or drink for a long time. This is also called "fasting."  You are overweight (obese).  You lose weight too fast.  You are pregnant.  You have high levels of fat in the blood (triglycerides).  You have irritation and swelling of the pancreas (pancreatitis). What are the signs or symptoms? Symptoms of this condition include:  Pain in the belly (abdomen). Pain is often in the upper right area of the belly.  Tenderness or bloating in the belly.  Feeling sick to your stomach (nauseous).  Throwing up (vomiting).  Fever.  Chills. How is this diagnosed? This condition may be diagnosed with a medical history and exam. You may also have other  tests, such as:  Imaging tests. This may include: ? Ultrasound. ? CT scan of the belly. ? Nuclear scan. This is also called a HIDA scan. This scan lets your doctor see the bile as it moves in your body. ? MRI.  Blood tests. These are done to check: ? Your blood count. The white blood cell count may be higher than normal. ? How well your liver works.   How is this treated? This condition may be treated with:  Surgery to take out your gallbladder.  Antibiotic medicines to treat illnesses caused by germs.  Going without food for some time.  Giving fluids through an IV tube.  Medicines to treat pain or throwing up. Follow these instructions at home:  If you had surgery, follow instructions from your doctor about how to care for yourself after you go home. Medicines  Take over-the-counter and prescription medicines only as told by your doctor.  If you were prescribed an antibiotic medicine, take it as told by your doctor. Do not stop taking it even if you start to feel better.   General instructions  Follow instructions from your doctor about what to eat or drink. Do not eat or drink anything that makes you sick again.  Do not lift anything that is heavier than 10 lb (4.5 kg) until your doctor says that it is safe.  Do not use any products that contain nicotine or tobacco, such as cigarettes  and e-cigarettes. If you need help quitting, ask your doctor.  Keep all follow-up visits as told by your doctor. This is important. Contact a doctor if:  You have pain and your medicine does not help.  You have a fever. Get help right away if:  Your pain moves to: ? Another part of your belly. ? Your back.  Your symptoms do not go away.  You have new symptoms. Summary  Cholecystitis is swelling and irritation of the gallbladder.  This condition may be caused by stones or lumps that form in the gallbladder (gallstones).  Common symptoms are pain in the belly. You may feel  sick to your stomach and start throwing up. You may also have a fever and chills.  This condition may be treated with surgery to take out the gallbladder. It may also be treated with medicines, fasting, and fluids through an IV tube.  Follow what you are told about eating and drinking. Do not eat things that make you sick again. This information is not intended to replace advice given to you by your health care provider. Make sure you discuss any questions you have with your health care provider. Document Revised: 03/12/2018 Document Reviewed: 03/12/2018 Elsevier Patient Education  2021 [employer].      If you have any concerns or questions, please feel free to call our office.

## 2021-04-17 NOTE — Progress Notes (Signed)
Outpatient Surgical Follow Up  04/17/2021  [name] is an 75 y.o. female.   Chief Complaint  Patient presents with  . Follow-up    Gallbladder has drain in place 03/25/21    HPI: 75 year old female presented 3 and half weeks ago with acute cholecystitis as well as altered mental status and severe hyponatremia.  Given medical condition she was managed with antibiotics and percutaneous drain placement.  He is very anxious and is very frustrated because of the drain.  The daughter is accompanying her.  She denies any fevers or chills.  She does have some intermittent right upper quadrant pain.  No evidence of biliary obstruction.  Last sodium was 132 that was about 2 weeks ago.  His hemoglobin was 10.1. He has been having issues with blood pressure medications and compliance.  Now she is in a better place.  Past Medical History:  Diagnosis Date  . Allergy   . Arthritis of knee, degenerative 05/08/2015  . Basal cell carcinoma 03/2019   nose  . Colon cancer ([place]) 2014   Partial colon resection, chemo + rad tx's.   . Diabetes mellitus without complication ([place])    Pt takes Metformin.  Marland Kitchen Hyperlipidemia   . Hypertension   . SBO (small bowel obstruction) ([place]) 04/20/2018  . Seizures ([place])   . Umbilical hernia with obstruction but no gangrene   . Vitamin D deficiency     Past Surgical History:  Procedure Laterality Date  . CATARACT EXTRACTION, BILATERAL  01/2020   Dr. Theresia Lo  . COLOSTOMY REVERSAL  01/2014  . ILEOSTOMY  05/2013  . UMBILICAL HERNIA REPAIR  05/2018    Family History  Problem Relation Age of Onset  . Cancer Mother 24       unknown type  . Diabetes Daughter   . Diabetes Son     Social History:  reports that she has never smoked. She has never used smokeless tobacco. She reports that she does not drink alcohol and does not use drugs.  Allergies:  Allergies  Allergen Reactions  . Baclofen Other (See Comments)  . Pioglitazone Nausea Only    Medications  reviewed.    ROS Full ROS performed and is otherwise negative other than what is stated in HPI   BP 119/76   Pulse 74   Temp 98.7 F (37.1 C) (Oral)   Ht 5\' 2"  (1.575 m)   Wt 153 lb 3.2 oz (69.5 kg)   SpO2 95%   BMI 28.02 kg/m   Physical Exam Vitals and nursing note reviewed. Exam conducted with a chaperone present.  Constitutional:      General: She is not in acute distress.    Appearance: Normal appearance.  Pulmonary:     Effort: No respiratory distress.  Abdominal:     General: Abdomen is flat. There is no distension.     Palpations: Abdomen is soft. There is no mass.     Tenderness: There is no abdominal tenderness. There is no guarding or rebound.     Hernia: No hernia is present.  Musculoskeletal:        General: No swelling or tenderness. Normal range of motion.     Cervical back: Normal range of motion and neck supple. No rigidity or tenderness.  Skin:    General: Skin is warm and dry.     Capillary Refill: Capillary refill takes less than 2 seconds.  Neurological:     General: No focal deficit present.  Mental Status: She is alert and oriented to person, place, and time.  Psychiatric:        Mood and Affect: Mood normal.        Behavior: Behavior normal.        Thought Content: Thought content normal.        Judgment: Judgment normal.     Assessment/Plan: this Is a 75 year old female with multiple medical issues and history of cholecystitis.  Severe hyponatremia that has slowly resolved.  She is now getting back to her feet and is trying to manage her blood pressure.  I do not think she is quite ready for operative intervention.  I will recheck CBC, CMP and INR and will also order cholangiogram via catheter given the fact that she has some continued pain.  I will see her back in about 2 to 3 weeks at that time we will determine the best timing for cholecystectomy. I do firmly believe that she will need a least 2 or 3 weeks before operative intervention is  scheduled.  Extensive discussion with patient and the daughter regarding my thought process    Greater than 50% of the 45 minutes  visit was spent in counseling/coordination of care   Caroleen Hamman, MD [facility]

## 2021-04-17 NOTE — Telephone Encounter (Signed)
Copied from [place] [id number]. Topic: Quick Communication - Home Health Verbal Orders >> Apr 17, 2021  3:41 PM Tessa Lerner A wrote: Caller/Agency: Cecille Rubin / [facility] Number: [phone]  Requesting OT/PT/Skilled Nursing/Social Work/Speech Therapy: Skilled Nursing  Frequency: 2w2 1w7 2prn

## 2021-04-18 NOTE — Telephone Encounter (Signed)
Called and spoke with Cecille Rubin. Gave verbal orders for PT/OT/ Speech Therapy/ Skilled Nursing.

## 2021-04-19 ENCOUNTER — Encounter: Payer: Self-pay | Admitting: Surgery

## 2021-04-23 ENCOUNTER — Other Ambulatory Visit
Admission: RE | Admit: 2021-04-23 | Discharge: 2021-04-23 | Disposition: A | Discharge status: Home / Self Care | Payer: Medicare Other | Source: Ambulatory Visit | Attending: Surgery | Admitting: Surgery

## 2021-04-23 ENCOUNTER — Telehealth: Payer: Self-pay

## 2021-04-23 DIAGNOSIS — K810 Acute cholecystitis: Secondary | ICD-10-CM

## 2021-04-23 LAB — COMPREHENSIVE METABOLIC PANEL
ALT: 15 U/L (ref 0–44)
AST: 18 U/L (ref 15–41)
Albumin: 3.4 g/dL — ABNORMAL LOW (ref 3.5–5.0)
Alkaline Phosphatase: 98 U/L (ref 38–126)
Anion gap: 6 (ref 5–15)
BUN: 14 mg/dL (ref 8–23)
CO2: 26 mmol/L (ref 22–32)
Calcium: 8.8 mg/dL — ABNORMAL LOW (ref 8.9–10.3)
Chloride: 100 mmol/L (ref 98–111)
Creatinine, Ser: 0.95 mg/dL (ref 0.44–1.00)
GFR, Estimated: >60 mL/min (ref >60)
Glucose, Bld: 173 mg/dL — ABNORMAL HIGH (ref 70–99)
Potassium: 4.3 mmol/L (ref 3.5–5.1)
Sodium: 132 mmol/L — ABNORMAL LOW (ref 135–145)
Total Bilirubin: 0.5 mg/dL (ref 0.3–1.2)
Total Protein: 6.5 g/dL (ref 6.5–8.1)

## 2021-04-23 LAB — CBC
HCT: 35.6 % — ABNORMAL LOW (ref 36.0–46.0)
Hemoglobin: 11.6 g/dL — ABNORMAL LOW (ref 12.0–15.0)
MCH: 29.4 pg (ref 26.0–34.0)
MCHC: 32.6 g/dL (ref 30.0–36.0)
MCV: 90.1 fL (ref 80.0–100.0)
Platelets: 249 10*3/uL (ref 150–400)
RBC: 3.95 MIL/uL (ref 3.87–5.11)
RDW: 14.1 % (ref 11.5–15.5)
WBC: 8 10*3/uL (ref 4.0–10.5)
nRBC: 0 % (ref 0.0–0.2)

## 2021-04-23 LAB — PROTIME-INR
INR: 1 (ref 0.8–1.2)
Prothrombin Time: 13 seconds (ref 11.4–15.2)

## 2021-04-23 NOTE — Telephone Encounter (Signed)
Per Dr Dahlia Byes patient's labs are better. Message left for the patient with this information.

## 2021-04-27 DIAGNOSIS — Z85038 Personal history of other malignant neoplasm of large intestine: Secondary | ICD-10-CM | POA: Diagnosis not present

## 2021-04-27 DIAGNOSIS — N1831 Chronic kidney disease, stage 3a: Secondary | ICD-10-CM | POA: Diagnosis not present

## 2021-04-27 DIAGNOSIS — Z8719 Personal history of other diseases of the digestive system: Secondary | ICD-10-CM | POA: Diagnosis not present

## 2021-04-27 DIAGNOSIS — M1710 Unilateral primary osteoarthritis, unspecified knee: Secondary | ICD-10-CM | POA: Diagnosis not present

## 2021-04-27 DIAGNOSIS — G40909 Epilepsy, unspecified, not intractable, without status epilepticus: Secondary | ICD-10-CM | POA: Diagnosis not present

## 2021-04-27 DIAGNOSIS — E871 Hypo-osmolality and hyponatremia: Secondary | ICD-10-CM | POA: Diagnosis not present

## 2021-04-27 DIAGNOSIS — E559 Vitamin D deficiency, unspecified: Secondary | ICD-10-CM | POA: Diagnosis not present

## 2021-04-27 DIAGNOSIS — Z9221 Personal history of antineoplastic chemotherapy: Secondary | ICD-10-CM | POA: Diagnosis not present

## 2021-04-27 DIAGNOSIS — I129 Hypertensive chronic kidney disease with stage 1 through stage 4 chronic kidney disease, or unspecified chronic kidney disease: Secondary | ICD-10-CM | POA: Diagnosis not present

## 2021-04-27 DIAGNOSIS — K8001 Calculus of gallbladder with acute cholecystitis with obstruction: Secondary | ICD-10-CM | POA: Diagnosis not present

## 2021-04-27 DIAGNOSIS — Z86010 Personal history of colonic polyps: Secondary | ICD-10-CM | POA: Diagnosis not present

## 2021-04-27 DIAGNOSIS — Z923 Personal history of irradiation: Secondary | ICD-10-CM | POA: Diagnosis not present

## 2021-04-27 DIAGNOSIS — Z85828 Personal history of other malignant neoplasm of skin: Secondary | ICD-10-CM | POA: Diagnosis not present

## 2021-04-27 DIAGNOSIS — Z4803 Encounter for change or removal of drains: Secondary | ICD-10-CM | POA: Diagnosis not present

## 2021-04-27 DIAGNOSIS — E1122 Type 2 diabetes mellitus with diabetic chronic kidney disease: Secondary | ICD-10-CM | POA: Diagnosis not present

## 2021-04-27 DIAGNOSIS — E785 Hyperlipidemia, unspecified: Secondary | ICD-10-CM | POA: Diagnosis not present

## 2021-04-29 ENCOUNTER — Encounter: Payer: Self-pay | Admitting: Internal Medicine

## 2021-04-29 ENCOUNTER — Other Ambulatory Visit: Payer: Self-pay

## 2021-04-29 ENCOUNTER — Ambulatory Visit (INDEPENDENT_AMBULATORY_CARE_PROVIDER_SITE_OTHER): Payer: Medicare Other | Admitting: Internal Medicine

## 2021-04-29 VITALS — BP 138/65 | HR 70 | Temp 98.4°F | Ht 62.0 in | Wt 148.0 lb

## 2021-04-29 DIAGNOSIS — E118 Type 2 diabetes mellitus with unspecified complications: Secondary | ICD-10-CM

## 2021-04-29 DIAGNOSIS — I10 Essential (primary) hypertension: Secondary | ICD-10-CM | POA: Diagnosis not present

## 2021-04-29 DIAGNOSIS — Z434 Encounter for attention to other artificial openings of digestive tract: Secondary | ICD-10-CM | POA: Diagnosis not present

## 2021-04-29 LAB — POCT GLYCOSYLATED HEMOGLOBIN (HGB A1C): Hemoglobin A1C: 7.4 % — AB (ref 4.0–5.6)

## 2021-04-29 MED ORDER — LANTUS SOLOSTAR 100 UNIT/ML ~~LOC~~ SOPN: PEN_INJECTOR | SUBCUTANEOUS | 1 refills | Status: DC

## 2021-04-29 NOTE — Patient Instructions (Signed)
Change insulin to   10 units AM and 8 units PM

## 2021-04-29 NOTE — Progress Notes (Signed)
Date:  04/29/2021   Name:  [name]   DOB:  Feb 02, 1946   MRN:  [id number]   Chief Complaint: Diabetes (Last BS this morning 150)  Diabetes She presents for her follow-up (had stopped metformin last visit due to nausea) diabetic visit. She has type 2 diabetes mellitus. Her disease course has been stable. Hypoglycemia symptoms include nervousness/anxiousness. Pertinent negatives for hypoglycemia include no dizziness or headaches. Pertinent negatives for diabetes include no chest pain and no fatigue. Current diabetic treatment includes insulin injections. She is compliant with treatment all of the time. Her weight is stable. She is following a generally healthy diet. Her breakfast blood glucose is taken between 7-8 am. Her breakfast blood glucose range is generally 90-110 mg/dl. Her bedtime blood glucose is taken between 9-10 pm. Her bedtime blood glucose range is generally 180-200 mg/dl. An ACE inhibitor/angiotensin II receptor blocker is being taken.  Hypertension This is a chronic problem. The problem is controlled. Pertinent negatives include no chest pain, headaches or shortness of breath. Past treatments include ACE inhibitors and beta blockers (stopped hydralazine and diuretics due to hypotension).   Lab Results  Component Value Date   CREATININE 0.95 04/23/2021   BUN 14 04/23/2021   NA 132 (L) 04/23/2021   K 4.3 04/23/2021   CL 100 04/23/2021   CO2 26 04/23/2021   Lab Results  Component Value Date   CHOL 204 (H) 12/25/2020   HDL 99 12/25/2020   LDLCALC 93 12/25/2020   TRIG 67 12/25/2020   CHOLHDL 2.1 12/25/2020   Lab Results  Component Value Date   TSH 1.710 12/25/2020   Lab Results  Component Value Date   HGBA1C 7.4 (A) 04/29/2021   Lab Results  Component Value Date   WBC 8.0 04/23/2021   HGB 11.6 (L) 04/23/2021   HCT 35.6 (L) 04/23/2021   MCV 90.1 04/23/2021   PLT 249 04/23/2021   Lab Results  Component Value Date   ALT 15 04/23/2021   AST 18  04/23/2021   ALKPHOS 98 04/23/2021   BILITOT 0.5 04/23/2021     Review of Systems  Constitutional:  Negative for chills, fatigue and fever.  Respiratory:  Negative for chest tightness and shortness of breath.   Cardiovascular:  Negative for chest pain and leg swelling.  Gastrointestinal:  Positive for abdominal pain (and discomfort at tube site).  Neurological:  Negative for dizziness and headaches.  Psychiatric/Behavioral:  Positive for dysphoric mood. Negative for sleep disturbance. The patient is nervous/anxious.    Patient Active Problem List   Diagnosis Date Noted   Acute cholecystitis 03/24/2021   Mood disorder ([place]) 12/21/2019   Primary osteoarthritis of both hips 06/10/2019   Bilateral carotid artery stenosis 08/24/2018   Atherosclerosis of aorta ([place]) 08/24/2018   Type II diabetes mellitus with complication ([place]) 01/60/1093   Bradycardia 07/24/2018   Neoplasm of uncertain behavior of skin 01/16/2017   Senile ecchymosis 08/20/2015   Essential (primary) hypertension 05/08/2015   History of rectal cancer 05/08/2015   Hyperlipidemia associated with type 2 diabetes mellitus ([place]) 05/08/2015   Seizure disorder ([place]) 05/08/2015   Shoulder strain 05/08/2015   Avitaminosis D 05/08/2015    Allergies  Allergen Reactions   Baclofen Other (See Comments)   Pioglitazone Nausea Only    Past Surgical History:  Procedure Laterality Date   CATARACT EXTRACTION, BILATERAL  01/2020   Dr. Lucianne Lei [facility]   COLOSTOMY REVERSAL  01/2014   ILEOSTOMY  12/3555   UMBILICAL HERNIA REPAIR  05/2018    Social History   Tobacco Use   Smoking status: Never   Smokeless tobacco: Never  Vaping Use   Vaping Use: Never used  Substance Use Topics   Alcohol use: No    Alcohol/week: 0.0 standard drinks   Drug use: No     Medication list has been reviewed and updated.  Current Meds  Medication Sig   acetaminophen (TYLENOL) 500 MG tablet Take 2 tablets (1,000 mg total) by mouth every 6 (six)  hours as needed.   aspirin 81 MG chewable tablet Chew 81 mg by mouth daily.    atorvastatin (LIPITOR) 40 MG tablet TAKE 1 TABLET BY MOUTH DAILY   Bacillus Coagulans-Inulin (PROBIOTIC) 1-250 BILLION-MG CAPS Take 1 capsule by mouth daily.   calcium carbonate (OSCAL) 1500 (600 Ca) MG TABS tablet Take 600 mg of elemental calcium by mouth daily.   carbamazepine (TEGRETOL) 200 MG tablet TAKE 1 TABLET(200 MG) BY MOUTH DAILY   Cholecalciferol 25 MCG (1000 UT) capsule Take 1,000 Units by mouth daily.    Fluocinolone Acetonide 0.01 % OIL as needed.   metoprolol succinate (TOPROL-XL) 25 MG 24 hr tablet Take 12.5 mg by mouth 2 (two) times daily.   nitroGLYCERIN (NITROSTAT) 0.4 MG SL tablet Place 1 tablet (0.4 mg total) under the tongue every 5 (five) minutes x 3 doses as needed for chest pain.   ONETOUCH ULTRA test strip USE 1 STRIP TO CHECK GLUCOSE TWICE DAILY   pantoprazole (PROTONIX) 40 MG tablet Take 1 tablet (40 mg total) by mouth daily as needed (heartburn).   Propylene Glycol (SYSTANE BALANCE OP) Apply to eye.   ramipril (ALTACE) 5 MG capsule TAKE 1 CAPSULE BY MOUTH DAILY   [DISCONTINUED] insulin glargine (LANTUS SOLOSTAR) 100 UNIT/ML Solostar Pen Inject 20-25 Units into the skin daily.    PHQ 2/9 Scores 04/29/2021 04/12/2021 12/25/2020 12/24/2020  PHQ - 2 Score 2 4 4 2   PHQ- 9 Score 5 11 6 5     GAD 7 : Generalized Anxiety Score 04/29/2021 04/12/2021 12/25/2020 08/23/2020  Nervous, Anxious, on Edge 0 2 2 2   Control/stop worrying 0 2 2 2   Worry too much - different things 0 2 2 2   Trouble relaxing 0 3 3 1   Restless 0 3 3 1   Easily annoyed or irritable 0 2 0 0  Afraid - awful might happen 0 2 0 0  Total GAD 7 Score 0 16 12 8   Anxiety Difficulty - - Not difficult at all Not difficult at all    BP Readings from Last 3 Encounters:  04/29/21 138/65  04/17/21 119/76  04/12/21 128/70    Physical Exam Vitals and nursing note reviewed.  Constitutional:      General: She is not in acute distress.     Appearance: She is well-developed.  HENT:     Head: Normocephalic and atraumatic.  Cardiovascular:     Rate and Rhythm: Normal rate and regular rhythm.     Pulses: Normal pulses.     Heart sounds: No murmur heard. Pulmonary:     Effort: Pulmonary effort is normal. No respiratory distress.     Breath sounds: No wheezing or rhonchi.  Musculoskeletal:     Cervical back: Normal range of motion and neck supple.     Right lower leg: No edema.     Left lower leg: No edema.  Skin:    General: Skin is warm and dry.     Findings: No rash.  Neurological:  General: No focal deficit present.     Mental Status: She is alert and oriented to person, place, and time.  Psychiatric:        Mood and Affect: Mood normal.        Behavior: Behavior normal.    Wt Readings from Last 3 Encounters:  04/29/21 148 lb (67.1 kg)  04/17/21 153 lb 3.2 oz (69.5 kg)  04/12/21 150 lb (68 kg)    BP 138/65   Pulse 70   Temp 98.4 F (36.9 C) (Oral)   Ht 5\' 2"  (1.575 m)   Wt 148 lb (67.1 kg)   SpO2 96%   BMI 27.07 kg/m   Assessment and Plan: 1. Type II diabetes mellitus with complication (HCC) Overall DM is controlled but wide swings in glucoses from AM to PM. Will begin split dose insulin - 10 u AM and 8 u PM lantus Continue to monitor at home; healthy diet with protein and fluids - POCT HgB A1C = 7.4 - insulin glargine (LANTUS SOLOSTAR) 100 UNIT/ML Solostar Pen; Inject 10 Units into the skin every morning AND 8 Units every evening.  Dispense: 15 mL; Refill: 1  2. Essential (primary) hypertension BP is fairly well controlled now on Altace and metoprolol Continue to monitor; remain off of diuretics  3. Cholecystostomy care [facility]) Pt has CT tomorrow then sees Gen surgery for operative planning Continue local care Anxiety and depressive symptoms are elevated due to ongoing issues in this regard   Partially dictated using [place]. Any errors are unintentional.  Halina Maidens, MD [facility]  04/29/2021

## 2021-04-30 ENCOUNTER — Ambulatory Visit
Admission: RE | Admit: 2021-04-30 | Discharge: 2021-04-30 | Disposition: A | Discharge status: Home / Self Care | Payer: Medicare Other | Source: Ambulatory Visit | Attending: Surgery | Admitting: Surgery

## 2021-04-30 DIAGNOSIS — K810 Acute cholecystitis: Secondary | ICD-10-CM | POA: Diagnosis not present

## 2021-04-30 DIAGNOSIS — K8020 Calculus of gallbladder without cholecystitis without obstruction: Secondary | ICD-10-CM | POA: Diagnosis not present

## 2021-05-02 ENCOUNTER — Telehealth: Payer: Self-pay

## 2021-05-02 NOTE — Telephone Encounter (Signed)
Pt notified of DG Cholangiogram results. Verbalizes understanding.

## 2021-05-06 ENCOUNTER — Encounter: Payer: Self-pay | Admitting: Internal Medicine

## 2021-05-06 ENCOUNTER — Other Ambulatory Visit: Payer: Self-pay | Admitting: Internal Medicine

## 2021-05-06 ENCOUNTER — Telehealth: Payer: Self-pay | Admitting: *Deleted

## 2021-05-06 DIAGNOSIS — K810 Acute cholecystitis: Secondary | ICD-10-CM

## 2021-05-06 NOTE — Telephone Encounter (Signed)
Patient called and wants to talk to a nurse in regards to her drain. She stated that the stitch holding it in place has broken off and is barley in the skin. She wants to make sure that is okay or if she needs to come in.   Acute Cholecystitis

## 2021-05-06 NOTE — Progress Notes (Deleted)
Received orders from [place]. Start of care 03/28/21.   Orders from 03/28/21 through 05/26/21 are reviewed, signed and faxed.

## 2021-05-06 NOTE — Telephone Encounter (Signed)
Spoke with the patient and she is concerned that her tube may come out early. She will come in tomorrow morning to see Edison Simon PA-C. He can place another stitch if needed to secure this.

## 2021-05-07 ENCOUNTER — Ambulatory Visit: Payer: Medicare Other | Admitting: Physician Assistant

## 2021-05-07 ENCOUNTER — Encounter: Payer: Self-pay | Admitting: Physician Assistant

## 2021-05-07 ENCOUNTER — Other Ambulatory Visit: Payer: Self-pay

## 2021-05-07 VITALS — BP 184/76 | HR 64 | Temp 98.3°F | Ht 62.0 in | Wt 148.2 lb

## 2021-05-07 DIAGNOSIS — K810 Acute cholecystitis: Secondary | ICD-10-CM

## 2021-05-07 MED ORDER — AMOXICILLIN-POT CLAVULANATE 875-125 MG PO TABS: 1.0000 | ORAL_TABLET | Freq: Two times a day (BID) | ORAL | 0 refills | Status: AC

## 2021-05-07 NOTE — Patient Instructions (Addendum)
Continue to flush the drain. We placed a suture to secure the drain tubing. Please pick up your medication at the pharmacy.   Keep your appointment next week.

## 2021-05-07 NOTE — Progress Notes (Signed)
[facility] NOTE  05/07/2021  History of Present Illness: [name] is a 75 y.o. female known to our service following admission in May of this year for cholecystitis and hyponatremia which was managed with cholecystostomy tube placed by interventional radiology. She has been following with Dr Dahlia Byes for optimization prior to interval cholecystectomy. She underwent Cholangiogram on 06/14, which I have reviewed, showing well positioned cholecystostomy tube with large gallstone and resultant cystic duct obstruction. Most recent sodium was 132 on 06/07. She is due to see Dr Dahlia Byes again on 06/29.   She presents to clinic today for concerns over her cholecystostomy tube. She was advised by her home health nurse to call the office today to be seen as she felt the suture securing the tubing had broken. No issues with the securing dressing. She continues to endorse intermittent RUQ pain from time to time and is very frustrated and anxious about the cholecystostomy tube and when she can have surgery. She reports that the drainage had been a dark green color but recently changed to a tan color. No fever, chills, nausea, emesis. No other complaints this morning.    Past Medical History: Past Medical History:  Diagnosis Date   Allergy    Arthritis of knee, degenerative 05/08/2015   Basal cell carcinoma 03/2019   nose   Colon cancer ([place]) 2014   Partial colon resection, chemo + rad tx's.    Diabetes mellitus without complication ([place])    Pt takes Metformin.   Hyperlipidemia    Hypertension    SBO (small bowel obstruction) ([place]) 04/20/2018   Seizures ([place])    Umbilical hernia with obstruction but no gangrene    Vitamin D deficiency      Past Surgical History: Past Surgical History:  Procedure Laterality Date   CATARACT EXTRACTION, BILATERAL  01/2020   Dr. Lucianne Lei [facility]   COLOSTOMY REVERSAL  01/2014   ILEOSTOMY  06/1190   UMBILICAL HERNIA REPAIR  05/2018    Home  Medications: Prior to Admission medications   Medication Sig Start Date End Date Taking? Authorizing Provider  acetaminophen (TYLENOL) 500 MG tablet Take 2 tablets (1,000 mg total) by mouth every 6 (six) hours as needed. 03/31/21 03/31/22  Ward, Delice Bison, DO  aspirin 81 MG chewable tablet Chew 81 mg by mouth daily.     [provider]  atorvastatin (LIPITOR) 40 MG tablet TAKE 1 TABLET BY MOUTH DAILY 02/14/21   Glean Hess, MD  Bacillus Coagulans-Inulin (PROBIOTIC) 1-250 BILLION-MG CAPS Take 1 capsule by mouth daily. 03/31/21   Ward, Delice Bison, DO  calcium carbonate (OSCAL) 1500 (600 Ca) MG TABS tablet Take 600 mg of elemental calcium by mouth daily.    [provider]  carbamazepine (TEGRETOL) 200 MG tablet TAKE 1 TABLET(200 MG) BY MOUTH DAILY 08/06/20   Glean Hess, MD  Cholecalciferol 25 MCG (1000 UT) capsule Take 1,000 Units by mouth daily.  09/22/14   [provider]  Fluocinolone Acetonide 0.01 % OIL as needed. 12/31/18   [provider]  insulin glargine (LANTUS SOLOSTAR) 100 UNIT/ML Solostar Pen Inject 10 Units into the skin every morning AND 8 Units every evening. 04/29/21   Glean Hess, MD  metoprolol succinate (TOPROL-XL) 25 MG 24 hr tablet Take 12.5 mg by mouth 2 (two) times daily.    [provider]  nitroGLYCERIN (NITROSTAT) 0.4 MG SL tablet Place 1 tablet (0.4 mg total) under the tongue every 5 (five) minutes x 3  doses as needed for chest pain. 07/26/18   Demetrios Loll, MD  [facility] ULTRA test strip USE 1 STRIP TO CHECK GLUCOSE TWICE DAILY 12/17/20   Glean Hess, MD  pantoprazole (PROTONIX) 40 MG tablet Take 1 tablet (40 mg total) by mouth daily as needed (heartburn). 03/31/21 03/31/22  Ward, Delice Bison, DO  Propylene Glycol (SYSTANE BALANCE OP) Apply to eye.    [provider]  ramipril (ALTACE) 5 MG capsule TAKE 1 CAPSULE BY MOUTH DAILY 11/12/20   Glean Hess, MD    Allergies: Allergies  Allergen Reactions    Baclofen Other (See Comments)   Pioglitazone Nausea Only    Review of Systems: Review of Systems  Constitutional:  Negative for chills and fever.  Respiratory:  Negative for cough and shortness of breath.   Cardiovascular:  Negative for chest pain and palpitations.  Gastrointestinal:  Positive for abdominal pain. Negative for diarrhea, nausea and vomiting.       + Cholecystostomy tube issue  Genitourinary:  Negative for urgency.  All other systems reviewed and are negative.  Physical Exam There were no vitals taken for this visit.  Physical Exam Vitals and nursing note reviewed. Exam conducted with a chaperone present.  Constitutional:      General: She is not in acute distress.    Appearance: Normal appearance. She is not ill-appearing.  HENT:     Head: Normocephalic and atraumatic.  Eyes:     Conjunctiva/sclera: Conjunctivae normal.     Pupils: Pupils are equal, round, and reactive to light.  Neck:     Comments: Abdomen is soft, non-tender, non-distended, no rebound/guarding. Cholecystostomy tube in the RUQ, suture has come loose, dressing remains intact, output does appear more seropurulent in bag and tubing.  Pulmonary:     Effort: Pulmonary effort is normal. No respiratory distress.  Abdominal:     General: Abdomen is flat.     Palpations: Abdomen is soft.     Tenderness: There is no abdominal tenderness. There is no guarding or rebound.  Genitourinary:    Comments: Deferred Musculoskeletal:     Right lower leg: No edema.     Left lower leg: No edema.  Skin:    General: Skin is warm and dry.  Neurological:     General: No focal deficit present.     Mental Status: She is alert and oriented to person, place, and time.  Psychiatric:        Mood and Affect: Mood is anxious.        Behavior: Behavior normal.    Labs/Imaging:  Cholangiogram (04/30/2021) personally reviewed and agree with radiologist report which is reviewed below:  IMPRESSION: 1. The  cholecystostomy catheter remains in good position and patent. 2. Large dominant gallstone with cystic duct obstruction.   PLAN: The catheter was returned to external gravity drainage.   Patient will follow-up with Dr. Dahlia Byes regarding cholecystectomy.   Assessment and Plan: This is a 75 y.o. female with cholecystitis managed with percutaneous cholecystostomy tube who presents for issues with the catheter.    - Using sterile technique, I injected 5 ccs of lidocaine with epinephrine around the cholecystostomy site and obtained adequate anesthesia. Using a 3-0 Silk suture, I was able to secure the tubing. Patient tolerated this well without immediate complication.   - given her reported change in output and now seropurulent appearance, I will restart her on Augmentin x7 days as a precaution  - She was encouraged to continue drain care at home  as well as monitor/record output  - She will keep her follow up with Dr Dahlia Byes on 06/29  Face-to-face time spent with the patient and care providers was 20 minutes, with more than 50% of the time spent counseling, educating, and coordinating care of the patient.     Edison Simon, PA-C [facility] 05/07/2021, 10:11 AM [phone] M-F: 7am - 4pm

## 2021-05-15 ENCOUNTER — Ambulatory Visit (INDEPENDENT_AMBULATORY_CARE_PROVIDER_SITE_OTHER): Payer: Medicare Other | Admitting: Surgery

## 2021-05-15 ENCOUNTER — Encounter: Payer: Self-pay | Admitting: Surgery

## 2021-05-15 ENCOUNTER — Other Ambulatory Visit: Payer: Self-pay

## 2021-05-15 VITALS — BP 144/56 | HR 74 | Temp 97.9°F | Ht 62.0 in | Wt 150.0 lb

## 2021-05-15 DIAGNOSIS — K810 Acute cholecystitis: Secondary | ICD-10-CM | POA: Diagnosis not present

## 2021-05-15 MED ORDER — IOHEXOL 300 MG/ML  SOLN
25.0000 mL | Freq: Once | INTRAMUSCULAR | Status: AC | PRN
  Administered 2021-04-30: 25 mL

## 2021-05-15 NOTE — Patient Instructions (Signed)
You have requested to have your gallbladder removed. This will be done at [facility] with Dr. Dahlia Byes.  You will most likely be out of work 1-2 weeks for this surgery. You will return after your post-op appointment with a lifting restriction for approximately 4 more weeks.  You will be able to eat anything you would like to following surgery. But, start by eating a bland diet and advance this as tolerated. The Gallbladder diet is below, please go as closely by this diet as possible prior to surgery to avoid any further attacks.  Please see the (blue)pre-care form that you have been given today. Our surgery scheduler will call you to verify surgery date and to go over information.   If you have any questions, please call our office.  Laparoscopic Cholecystectomy Laparoscopic cholecystectomy is surgery to remove the gallbladder. The gallbladder is located in the upper right part of the abdomen, behind the liver. It is a storage sac for bile, which is produced in the liver. Bile aids in the digestion and absorption of fats. Cholecystectomy is often done for inflammation of the gallbladder (cholecystitis). This condition is usually caused by a buildup of gallstones (cholelithiasis) in the gallbladder. Gallstones can block the flow of bile, and that can result in inflammation and pain. In severe cases, emergency surgery may be required. If emergency surgery is not required, you will have time to prepare for the procedure. Laparoscopic surgery is an alternative to open surgery. Laparoscopic surgery has a shorter recovery time. Your common bile duct may also need to be examined during the procedure. If stones are found in the common bile duct, they may be removed. LET [facility] CARE PROVIDER KNOW ABOUT: Any allergies you have. All medicines you are taking, including vitamins, herbs, eye drops, creams, and over-the-counter medicines. Previous problems you or members of your family have had with the  use of anesthetics. Any blood disorders you have. Previous surgeries you have had.  Any medical conditions you have. RISKS AND COMPLICATIONS Generally, this is a safe procedure. However, problems may occur, including: Infection. Bleeding. Allergic reactions to medicines. Damage to other structures or organs. A stone remaining in the common bile duct. A bile leak from the cyst duct that is clipped when your gallbladder is removed. The need to convert to open surgery, which requires a larger incision in the abdomen. This may be necessary if your surgeon thinks that it is not safe to continue with a laparoscopic procedure. BEFORE THE PROCEDURE Ask your health care provider about: Changing or stopping your regular medicines. This is especially important if you are taking diabetes medicines or blood thinners. Taking medicines such as aspirin and ibuprofen. These medicines can thin your blood. Do not take these medicines before your procedure if your health care provider instructs you not to. Follow instructions from your health care provider about eating or drinking restrictions. Let your health care provider know if you develop a cold or an infection before surgery. Plan to have someone take you home after the procedure. Ask your health care provider how your surgical site will be marked or identified. You may be given antibiotic medicine to help prevent infection. PROCEDURE To reduce your risk of infection: Your health care team will wash or sanitize their hands. Your skin will be washed with soap. An IV tube may be inserted into one of your veins. You will be given a medicine to make you fall asleep (general anesthetic). A breathing tube will be placed in  your mouth. The surgeon will make several small cuts (incisions) in your abdomen. A thin, lighted tube (laparoscope) that has a tiny camera on the end will be inserted through one of the small incisions. The camera on the laparoscope  will send a picture to a TV screen (monitor) in the operating room. This will give the surgeon a good view inside your abdomen. A gas will be pumped into your abdomen. This will expand your abdomen to give the surgeon more room to perform the surgery. Other tools that are needed for the procedure will be inserted through the other incisions. The gallbladder will be removed through one of the incisions. After your gallbladder has been removed, the incisions will be closed with stitches (sutures), staples, or skin glue. Your incisions may be covered with a bandage (dressing). The procedure may vary among health care providers and hospitals. AFTER THE PROCEDURE Your blood pressure, heart rate, breathing rate, and blood oxygen level will be monitored often until the medicines you were given have worn off. You will be given medicines as needed to control your pain.   This information is not intended to replace advice given to you by your health care provider. Make sure you discuss any questions you have with your health care provider.   Document Released: 11/03/2005 Document Revised: 07/25/2015 Document Reviewed: 06/15/2013 Elsevier Interactive Patient Education 2016 [place] Diet for Gallbladder Conditions A low-fat diet can be helpful if you have pancreatitis or a gallbladder condition. With these conditions, your pancreas and gallbladder have trouble digesting fats. A healthy eating plan with less fat will help rest your pancreas and gallbladder and reduce your symptoms. WHAT DO I NEED TO KNOW ABOUT THIS DIET? Eat a low-fat diet. Reduce your fat intake to less than 20-30% of your total daily calories. This is less than 50-60 g of fat per day. Remember that you need some fat in your diet. Ask your dietician what your daily goal should be. Choose nonfat and low-fat healthy foods. Look for the words "nonfat," "low fat," or "fat free." As a guide, look on the label and choose foods  with less than 3 g of fat per serving. Eat only one serving. Avoid alcohol. Do not smoke. If you need help quitting, talk with your health care provider. Eat small frequent meals instead of three large heavy meals. WHAT FOODS CAN I EAT? Grains Include healthy grains and starches such as potatoes, wheat bread, fiber-rich cereal, and brown rice. Choose whole grain options whenever possible. In adults, whole grains should account for 45-65% of your daily calories.  Fruits and Vegetables Eat plenty of fruits and vegetables. Fresh fruits and vegetables add fiber to your diet. Meats and Other Protein Sources Eat lean meat such as chicken and pork. Trim any fat off of meat before cooking it. Eggs, fish, and beans are other sources of protein. In adults, these foods should account for 10-35% of your daily calories. Dairy Choose low-fat milk and dairy options. Dairy includes fat and protein, as well as calcium.  Fats and Oils Limit high-fat foods such as fried foods, sweets, baked goods, sugary drinks.  Other Creamy sauces and condiments, such as mayonnaise, can add extra fat. Think about whether or not you need to use them, or use smaller amounts or low fat options. WHAT FOODS ARE NOT RECOMMENDED? High fat foods, such as: [employer]. Ice cream. [place] toast. Sweet rolls. Pizza. Cheese bread. Foods covered with batter, butter, creamy sauces, or  cheese. Fried foods. Sugary drinks and desserts. Foods that cause gas or bloating   This information is not intended to replace advice given to you by your health care provider. Make sure you discuss any questions you have with your health care provider.   Document Released: 11/08/2013 Document Reviewed: 11/08/2013 Elsevier Interactive Patient Education [employer].

## 2021-05-15 NOTE — H&P (View-Only) (Signed)
Outpatient Surgical Follow Up  05/15/2021  [name] is an 75 y.o. female.   Chief Complaint  Patient presents with   Follow-up    HPI: 75 year old female presented 7 weeks ago with acute cholecystitis as well as altered mental status and severe hyponatremia.  Given medical condition she was managed with antibiotics and percutaneous drain placement.  SHe is very anxious and is very frustrated because of the drain.  The daughter is accompanying her.  She denies any fevers or chills.  She does have some intermittent right upper quadrant pain.  No evidence of biliary obstruction.  Last sodium was 132 t  Blood pressure has improved as well as h overall strength.  She is back to baseline and feels better.  She did have a recent cholangiogram that have personally reviewed showing evidence of cystic duct obstruction from a gallstone.  The cholecystostomy tube is in place.  She was seen by Mr. Elissa Hefty my PA and was prescribed a short course of antibiotics a couple weeks.  She feels much better.  Denies any fevers and chills.  Past Medical History:  Diagnosis Date   Allergy    Arthritis of knee, degenerative 05/08/2015   Basal cell carcinoma 03/2019   nose   Colon cancer ([place]) 2014   Partial colon resection, chemo + rad tx's.    Diabetes mellitus without complication ([place])    Pt takes Metformin.   Hyperlipidemia    Hypertension    SBO (small bowel obstruction) ([facility]) 04/20/2018   Seizures ([facility])    Umbilical hernia with obstruction but no gangrene    Vitamin D deficiency     Past Surgical History:  Procedure Laterality Date   CATARACT EXTRACTION, BILATERAL  01/2020   Dr. Lucianne Lei [facility]   COLOSTOMY REVERSAL  01/2014   ILEOSTOMY  12/9796   UMBILICAL HERNIA REPAIR  05/2018    Family History  Problem Relation Age of Onset   Cancer Mother 35       unknown type   Diabetes Daughter    Diabetes Son     Social History:  reports that she has never smoked. She has never used smokeless tobacco.  She reports that she does not drink alcohol and does not use drugs.  Allergies:  Allergies  Allergen Reactions   Baclofen Other (See Comments)   Pioglitazone Nausea Only    Medications reviewed.    ROS Full ROS performed and is otherwise negative other than what is stated in HPI   BP (!) 144/56   Pulse 74   Temp 97.9 F (36.6 C)   Ht 5\' 2"  (1.575 m)   Wt 150 lb (68 kg)   SpO2 96%   BMI 27.44 kg/m   Physical Exam Vitals and nursing note reviewed. Exam conducted with a chaperone present.  Constitutional:      General: She is not in acute distress.    Appearance: Normal appearance.  Pulmonary:    Effort: No respiratory distress.  Abdominal:     General: Abdomen is flat. There is no distension.     Palpations: Abdomen is soft. There is no mass.     Tenderness: There is no abdominal tenderness. There is no guarding or rebound.  Cholecystostomy in place w green bile. No infection or peritonitis    Hernia: No hernia is present. Musculoskeletal:        General: No swelling or tenderness. Normal range of motion.     Cervical back: Normal range of motion and neck  supple. No rigidity or tenderness. Skin:    General: Skin is warm and dry.     Capillary Refill: Capillary refill takes less than 2 seconds.  Neurological:    General: No focal deficit present.     Mental Status: She is alert and oriented to person, place, and time.  Psychiatric:        Mood and Affect: Mood normal.        Behavior: Behavior normal.        Thought Content: Thought content normal.        Judgment: Judgment normal.    Assessment/Plan: 70-year-old female with prior history of acute cholecystitis requiring cholecystostomy tube.  She has improved from a general medical perspective on the hyponatremia.  She now has regained her strength and is ready to move forward with cholecystectomy and removal of drain. I discussed the procedure in detail.  The patient was given Neurosurgeon.  We discussed  the risks and benefits of a laparoscopic cholecystectomy and possible cholangiogram including, but not limited to bleeding, infection, injury to surrounding structures such as the intestine or liver, bile leak, retained gallstones, need to convert to an open procedure, prolonged diarrhea, blood clots such as  DVT, common bile duct injury, anesthesia risks, and possible need for additional procedures.  The likelihood of improvement in symptoms and return to the patient's normal status is good. We discussed the typical post-operative recovery course.  Greater than 50% of the 45 minutes  visit was spent in counseling/coordination of care   Caroleen Hamman, MD [place] Surgeon

## 2021-05-15 NOTE — Progress Notes (Signed)
Outpatient Surgical Follow Up  05/15/2021  [name] is an 75 y.o. female.   Chief Complaint  Patient presents with   Follow-up    HPI: 75 year old female presented 7 weeks ago with acute cholecystitis as well as altered mental status and severe hyponatremia.  Given medical condition she was managed with antibiotics and percutaneous drain placement.  SHe is very anxious and is very frustrated because of the drain.  The daughter is accompanying her.  She denies any fevers or chills.  She does have some intermittent right upper quadrant pain.  No evidence of biliary obstruction.  Last sodium was 132 t  Blood pressure has improved as well as h overall strength.  She is back to baseline and feels better.  She did have a recent cholangiogram that have personally reviewed showing evidence of cystic duct obstruction from a gallstone.  The cholecystostomy tube is in place.  She was seen by Mr. Elissa Hefty my PA and was prescribed a short course of antibiotics a couple weeks.  She feels much better.  Denies any fevers and chills.  Past Medical History:  Diagnosis Date   Allergy    Arthritis of knee, degenerative 05/08/2015   Basal cell carcinoma 03/2019   nose   Colon cancer ([facility]) 2014   Partial colon resection, chemo + rad tx's.    Diabetes mellitus without complication ([facility])    Pt takes Metformin.   Hyperlipidemia    Hypertension    SBO (small bowel obstruction) ([facility]) 04/20/2018   Seizures ([facility])    Umbilical hernia with obstruction but no gangrene    Vitamin D deficiency     Past Surgical History:  Procedure Laterality Date   CATARACT EXTRACTION, BILATERAL  01/2020   Dr. Lucianne Lei [facility]   COLOSTOMY REVERSAL  01/2014   ILEOSTOMY  05/4258   UMBILICAL HERNIA REPAIR  05/2018    Family History  Problem Relation Age of Onset   Cancer Mother 96       unknown type   Diabetes Daughter    Diabetes Son     Social History:  reports that she has never smoked. She has never used smokeless tobacco.  She reports that she does not drink alcohol and does not use drugs.  Allergies:  Allergies  Allergen Reactions   Baclofen Other (See Comments)   Pioglitazone Nausea Only    Medications reviewed.    ROS Full ROS performed and is otherwise negative other than what is stated in HPI   BP (!) 144/56   Pulse 74   Temp 97.9 F (36.6 C)   Ht 5\' 2"  (1.575 m)   Wt 150 lb (68 kg)   SpO2 96%   BMI 27.44 kg/m   Physical Exam Vitals and nursing note reviewed. Exam conducted with a chaperone present.  Constitutional:      General: She is not in acute distress.    Appearance: Normal appearance.  Pulmonary:    Effort: No respiratory distress.  Abdominal:     General: Abdomen is flat. There is no distension.     Palpations: Abdomen is soft. There is no mass.     Tenderness: There is no abdominal tenderness. There is no guarding or rebound.  Cholecystostomy in place w green bile. No infection or peritonitis    Hernia: No hernia is present. Musculoskeletal:        General: No swelling or tenderness. Normal range of motion.     Cervical back: Normal range of motion and neck  supple. No rigidity or tenderness. Skin:    General: Skin is warm and dry.     Capillary Refill: Capillary refill takes less than 2 seconds.  Neurological:    General: No focal deficit present.     Mental Status: She is alert and oriented to person, place, and time.  Psychiatric:        Mood and Affect: Mood normal.        Behavior: Behavior normal.        Thought Content: Thought content normal.        Judgment: Judgment normal.    Assessment/Plan: 27-year-old female with prior history of acute cholecystitis requiring cholecystostomy tube.  She has improved from a general medical perspective on the hyponatremia.  She now has regained her strength and is ready to move forward with cholecystectomy and removal of drain. I discussed the procedure in detail.  The patient was given Neurosurgeon.  We discussed  the risks and benefits of a laparoscopic cholecystectomy and possible cholangiogram including, but not limited to bleeding, infection, injury to surrounding structures such as the intestine or liver, bile leak, retained gallstones, need to convert to an open procedure, prolonged diarrhea, blood clots such as  DVT, common bile duct injury, anesthesia risks, and possible need for additional procedures.  The likelihood of improvement in symptoms and return to the patient's normal status is good. We discussed the typical post-operative recovery course.  Greater than 50% of the 45 minutes  visit was spent in counseling/coordination of care   Caroleen Hamman, MD [facility] Surgeon

## 2021-05-16 ENCOUNTER — Telehealth: Payer: Self-pay | Admitting: Surgery

## 2021-05-16 NOTE — Telephone Encounter (Signed)
Patient has been advised of Pre-Admission date/time, COVID Testing date and Surgery date.  Surgery Date: 05/24/21 Preadmission Testing Date: 05/22/21 (phone 1p-5p) Covid Testing Date: Not needed.     Patient has been made aware to call [phone], between 1-3:00pm the day before surgery, to find out what time to arrive for surgery.

## 2021-05-21 DIAGNOSIS — H6062 Unspecified chronic otitis externa, left ear: Secondary | ICD-10-CM | POA: Diagnosis not present

## 2021-05-21 DIAGNOSIS — H6123 Impacted cerumen, bilateral: Secondary | ICD-10-CM | POA: Diagnosis not present

## 2021-05-21 DIAGNOSIS — J383 Other diseases of vocal cords: Secondary | ICD-10-CM | POA: Diagnosis not present

## 2021-05-22 ENCOUNTER — Other Ambulatory Visit: Payer: Self-pay

## 2021-05-22 ENCOUNTER — Other Ambulatory Visit
Admission: RE | Admit: 2021-05-22 | Discharge: 2021-05-22 | Disposition: A | Discharge status: Home / Self Care | Payer: Medicare Other | Source: Ambulatory Visit | Attending: Surgery | Admitting: Surgery

## 2021-05-22 HISTORY — DX: Nausea with vomiting, unspecified: R11.2

## 2021-05-22 HISTORY — DX: Other specified postprocedural states: Z98.890

## 2021-05-22 HISTORY — DX: Other complications of anesthesia, initial encounter: T88.59XA

## 2021-05-22 NOTE — Patient Instructions (Addendum)
Your procedure is scheduled on: 05/24/2021 Report to the Registration Desk on the 1st floor of the [facility]. To find out your arrival time, please call [phone] between 1PM - 3PM on:   REMEMBER: Instructions that are not followed completely may result in serious medical risk, up to and including death; or upon the discretion of your surgeon and anesthesiologist your surgery may need to be rescheduled.  Do not eat food after midnight the night before surgery.  No gum chewing, lozengers or hard candies.  You may however, drink CLEAR liquids up to 2 hours before you are scheduled to arrive for your surgery. Do not drink anything within 2 hours of your scheduled arrival time.  Clear liquids include: - water   Type 1 and Type 2 diabetics should only drink water or  G2  In addition, your doctor has ordered for you to drink the provided  Gatorade G2 - not given. Pt not coming to pick up   Drinking this carbohydrate drink up to two hours before surgery helps to reduce insulin resistance and improve patient outcomes. Please complete drinking 2 hours prior to scheduled arrival time.  TAKE THESE MEDICATIONS THE MORNING OF SURGERY WITH A SIP OF WATER:   Protonix (take one the night before and one on the morning of surgery - helps to prevent nausea after surgery.) carbamazepine (TEGRETOL) Metoprolol (toprol)    Lantus -half dose at night prior to surgery. Do not take lantus in morning of surgery Take 1/2 of usual insulin dose the night before surgery and none on the morning of surgery.  Do not take altace (ramipiril) on day of surgery  Follow recommendations from Cardiologist, Pulmonologist or PCP regarding stopping Aspirin, Coumadin, Plavix, Eliquis, Pradaxa, or Pletal.  One week prior to surgery: Stop Anti-inflammatories (NSAIDS) such as Advil, Aleve, Ibuprofen, Motrin, Naproxen, Naprosyn and Aspirin based products such as Excedrin, Goodys Powder, BC Powder. Stop ANY OVER THE  COUNTER supplements until after surgery. You may however, continue to take Tylenol if needed for pain up until the day of surgery.  No Alcohol for 24 hours before or after surgery.  No Smoking including e-cigarettes for 24 hours prior to surgery.  No chewable tobacco products for at least 6 hours prior to surgery.  No nicotine patches on the day of surgery.  Do not use any "recreational" drugs for at least a week prior to your surgery.  Please be advised that the combination of cocaine and anesthesia may have negative outcomes, up to and including death. If you test positive for cocaine, your surgery will be cancelled.  On the morning of surgery brush your teeth with toothpaste and water, you may rinse your mouth with mouthwash if you wish. Do not swallow any toothpaste or mouthwash.  Do not wear jewelry, make-up, hairpins, clips or nail polish.  Do not wear lotions, powders, or perfumes.   Do not shave body from the neck down 48 hours prior to surgery just in case you cut yourself which could leave a site for infection.  Also, freshly shaved skin may become irritated if using the CHG soap.  Contact lenses, hearing aids and dentures may not be worn into surgery.  Do not bring valuables to the hospital. [facility] is not responsible for any missing/lost belongings or valuables.   Use CHG Soap or wipes as directed on instruction sheet.   Notify your doctor if there is any change in your medical condition (cold, fever, infection).  Wear comfortable clothing (  specific to your surgery type) to the hospital.  After surgery, you can help prevent lung complications by doing breathing exercises.  Take deep breaths and cough every 1-2 hours. Your doctor may order a device called an Incentive Spirometer to help you take deep breaths. When coughing or sneezing, hold a pillow firmly against your incision with both hands. This is called "splinting." Doing this helps protect your incision. It  also decreases belly discomfort.   If you are being discharged the day of surgery, you will not be allowed to drive home. You will need a responsible adult (18 years or older) to drive you home and stay with you that night.   If you are taking public transportation, you will need to have a responsible adult (18 years or older) with you. Please confirm with your physician that it is acceptable to use public transportation.   Please call the [facility] Dept. at [phone] if you have any questions about these instructions.  Surgery Visitation Policy:  Patients undergoing a surgery or procedure may have one family member or support person with them as long as that person is not COVID-19 positive or experiencing its symptoms.  That person may remain in the waiting area during the procedure.

## 2021-05-23 MED ORDER — CELECOXIB 200 MG PO CAPS
200.0000 mg | ORAL_CAPSULE | ORAL | Status: AC
Start: 2021-05-24 — End: 2021-05-24

## 2021-05-23 MED ORDER — INDOCYANINE GREEN 25 MG IV SOLR
5.0000 mg | Freq: Once | INTRAVENOUS | Status: AC
  Administered 2021-05-24: 5 mg via INTRAVENOUS
  Filled 2021-05-23: qty 2

## 2021-05-23 MED ORDER — CHLORHEXIDINE GLUCONATE CLOTH 2 % EX PADS: 6.0000 | MEDICATED_PAD | Freq: Once | CUTANEOUS | Status: DC

## 2021-05-23 MED ORDER — GABAPENTIN 300 MG PO CAPS: 300.0000 mg | ORAL_CAPSULE | ORAL | Status: AC

## 2021-05-23 MED ORDER — CHLORHEXIDINE GLUCONATE CLOTH 2 % EX PADS
6.0000 | MEDICATED_PAD | Freq: Once | CUTANEOUS | Status: AC
  Administered 2021-05-24: 6 via TOPICAL

## 2021-05-23 MED ORDER — SODIUM CHLORIDE 0.9 % IV SOLN: INTRAVENOUS | Status: DC

## 2021-05-23 MED ORDER — ACETAMINOPHEN 500 MG PO TABS: 1000.0000 mg | ORAL_TABLET | ORAL | Status: AC

## 2021-05-23 MED ORDER — CEFAZOLIN SODIUM-DEXTROSE 2-4 GM/100ML-% IV SOLN
2.0000 g | INTRAVENOUS | Status: AC
Start: 2021-05-24 — End: 2021-05-24
  Administered 2021-05-24: 2 g via INTRAVENOUS

## 2021-05-24 ENCOUNTER — Ambulatory Visit: Payer: Medicare Other | Admitting: Anesthesiology

## 2021-05-24 ENCOUNTER — Encounter: Payer: Self-pay | Admitting: Surgery

## 2021-05-24 ENCOUNTER — Encounter
Admission: RE | Disposition: A | Discharge status: Home / Self Care | Payer: Self-pay | Source: Home / Self Care | Attending: Surgery

## 2021-05-24 ENCOUNTER — Ambulatory Visit: Payer: Medicare Other

## 2021-05-24 ENCOUNTER — Ambulatory Visit
Admission: RE | Admit: 2021-05-24 | Discharge: 2021-05-24 | Disposition: A | Discharge status: Home / Self Care | Payer: Medicare Other | Attending: Surgery | Admitting: Surgery

## 2021-05-24 DIAGNOSIS — K8020 Calculus of gallbladder without cholecystitis without obstruction: Secondary | ICD-10-CM | POA: Diagnosis not present

## 2021-05-24 DIAGNOSIS — Z978 Presence of other specified devices: Secondary | ICD-10-CM | POA: Diagnosis not present

## 2021-05-24 DIAGNOSIS — K810 Acute cholecystitis: Secondary | ICD-10-CM

## 2021-05-24 DIAGNOSIS — E871 Hypo-osmolality and hyponatremia: Secondary | ICD-10-CM | POA: Diagnosis not present

## 2021-05-24 DIAGNOSIS — Z888 Allergy status to other drugs, medicaments and biological substances status: Secondary | ICD-10-CM | POA: Insufficient documentation

## 2021-05-24 DIAGNOSIS — Z9359 Other cystostomy status: Secondary | ICD-10-CM | POA: Insufficient documentation

## 2021-05-24 DIAGNOSIS — Z48815 Encounter for surgical aftercare following surgery on the digestive system: Secondary | ICD-10-CM | POA: Diagnosis not present

## 2021-05-24 DIAGNOSIS — K8012 Calculus of gallbladder with acute and chronic cholecystitis without obstruction: Secondary | ICD-10-CM | POA: Diagnosis not present

## 2021-05-24 DIAGNOSIS — K8050 Calculus of bile duct without cholangitis or cholecystitis without obstruction: Secondary | ICD-10-CM | POA: Diagnosis not present

## 2021-05-24 DIAGNOSIS — E785 Hyperlipidemia, unspecified: Secondary | ICD-10-CM | POA: Diagnosis not present

## 2021-05-24 LAB — GLUCOSE, CAPILLARY
Glucose-Capillary: 160 mg/dL — ABNORMAL HIGH (ref 70–99)
Glucose-Capillary: 221 mg/dL — ABNORMAL HIGH (ref 70–99)

## 2021-05-24 SURGERY — CHOLECYSTECTOMY, ROBOT-ASSISTED, LAPAROSCOPIC
Anesthesia: General

## 2021-05-24 MED ORDER — MEPERIDINE HCL 25 MG/ML IJ SOLN: 6.2500 mg | INTRAMUSCULAR | Status: DC | PRN

## 2021-05-24 MED ORDER — BUPIVACAINE-EPINEPHRINE (PF) 0.25% -1:200000 IJ SOLN
INTRAMUSCULAR | Status: DC | PRN
  Administered 2021-05-24: 30 mL

## 2021-05-24 MED ORDER — ROCURONIUM BROMIDE 100 MG/10ML IV SOLN
INTRAVENOUS | Status: DC | PRN
  Administered 2021-05-24 (×2): 10 mg via INTRAVENOUS
  Administered 2021-05-24: 50 mg via INTRAVENOUS

## 2021-05-24 MED ORDER — FENTANYL CITRATE (PF) 100 MCG/2ML IJ SOLN
25.0000 ug | INTRAMUSCULAR | Status: DC | PRN
  Administered 2021-05-24: 25 ug via INTRAVENOUS

## 2021-05-24 MED ORDER — PROMETHAZINE HCL 25 MG/ML IJ SOLN: 6.2500 mg | INTRAMUSCULAR | Status: DC | PRN

## 2021-05-24 MED ORDER — ONDANSETRON HCL 4 MG/2ML IJ SOLN
INTRAMUSCULAR | Status: AC
  Filled 2021-05-24: qty 2

## 2021-05-24 MED ORDER — HYDROCODONE-ACETAMINOPHEN 5-325 MG PO TABS: 1.0000 | ORAL_TABLET | Freq: Four times a day (QID) | ORAL | 0 refills | Status: DC | PRN

## 2021-05-24 MED ORDER — OXYCODONE HCL 5 MG PO TABS
ORAL_TABLET | ORAL | Status: AC
  Filled 2021-05-24: qty 1

## 2021-05-24 MED ORDER — CEFAZOLIN SODIUM-DEXTROSE 2-4 GM/100ML-% IV SOLN
INTRAVENOUS | Status: AC
  Filled 2021-05-24: qty 100

## 2021-05-24 MED ORDER — ONDANSETRON HCL 4 MG/2ML IJ SOLN
INTRAMUSCULAR | Status: DC | PRN
  Administered 2021-05-24: 4 mg via INTRAVENOUS

## 2021-05-24 MED ORDER — LIDOCAINE HCL (PF) 2 % IJ SOLN
INTRAMUSCULAR | Status: AC
  Filled 2021-05-24: qty 5

## 2021-05-24 MED ORDER — LIDOCAINE HCL (CARDIAC) PF 100 MG/5ML IV SOSY
PREFILLED_SYRINGE | INTRAVENOUS | Status: DC | PRN
  Administered 2021-05-24: 25 mg via INTRAVENOUS

## 2021-05-24 MED ORDER — CHLORHEXIDINE GLUCONATE 0.12 % MT SOLN
OROMUCOSAL | Status: AC
  Administered 2021-05-24: 15 mL via OROMUCOSAL
  Filled 2021-05-24: qty 15

## 2021-05-24 MED ORDER — AMOXICILLIN-POT CLAVULANATE 875-125 MG PO TABS: 1.0000 | ORAL_TABLET | Freq: Two times a day (BID) | ORAL | 0 refills | Status: DC

## 2021-05-24 MED ORDER — FENTANYL CITRATE (PF) 100 MCG/2ML IJ SOLN
INTRAMUSCULAR | Status: DC | PRN
  Administered 2021-05-24: 25 ug via INTRAVENOUS

## 2021-05-24 MED ORDER — EPHEDRINE SULFATE 50 MG/ML IJ SOLN
INTRAMUSCULAR | Status: DC | PRN
  Administered 2021-05-24 (×2): 10 mg via INTRAVENOUS

## 2021-05-24 MED ORDER — GLYCOPYRROLATE 0.2 MG/ML IJ SOLN
INTRAMUSCULAR | Status: DC | PRN
  Administered 2021-05-24: .2 mg via INTRAVENOUS

## 2021-05-24 MED ORDER — BUPIVACAINE LIPOSOME 1.3 % IJ SUSP
INTRAMUSCULAR | Status: AC
  Filled 2021-05-24: qty 20

## 2021-05-24 MED ORDER — OXYCODONE HCL 5 MG PO TABS
5.0000 mg | ORAL_TABLET | Freq: Once | ORAL | Status: AC | PRN
  Administered 2021-05-24: 5 mg via ORAL

## 2021-05-24 MED ORDER — BUPIVACAINE LIPOSOME 1.3 % IJ SUSP
INTRAMUSCULAR | Status: DC | PRN
  Administered 2021-05-24: 20 mL

## 2021-05-24 MED ORDER — PROPOFOL 500 MG/50ML IV EMUL
INTRAVENOUS | Status: AC
  Filled 2021-05-24: qty 50

## 2021-05-24 MED ORDER — IOTHALAMATE MEGLUMINE 60 % INJ SOLN
INTRAMUSCULAR | Status: DC | PRN
  Administered 2021-05-24: 20 mL

## 2021-05-24 MED ORDER — GLYCOPYRROLATE 0.2 MG/ML IJ SOLN
INTRAMUSCULAR | Status: AC
  Filled 2021-05-24: qty 1

## 2021-05-24 MED ORDER — DEXAMETHASONE SODIUM PHOSPHATE 10 MG/ML IJ SOLN
INTRAMUSCULAR | Status: DC | PRN
  Administered 2021-05-24: 10 mg via INTRAVENOUS

## 2021-05-24 MED ORDER — ROCURONIUM BROMIDE 10 MG/ML (PF) SYRINGE
PREFILLED_SYRINGE | INTRAVENOUS | Status: AC
  Filled 2021-05-24: qty 10

## 2021-05-24 MED ORDER — SODIUM CHLORIDE 0.9 % IR SOLN
Status: DC | PRN
  Administered 2021-05-24: 1000 mL

## 2021-05-24 MED ORDER — FENTANYL CITRATE (PF) 100 MCG/2ML IJ SOLN
INTRAMUSCULAR | Status: AC
  Filled 2021-05-24: qty 2

## 2021-05-24 MED ORDER — APREPITANT 40 MG PO CAPS: 40.0000 mg | ORAL_CAPSULE | Freq: Once | ORAL | Status: AC

## 2021-05-24 MED ORDER — PROPOFOL 500 MG/50ML IV EMUL
INTRAVENOUS | Status: DC | PRN
  Administered 2021-05-24: 125 ug/kg/min via INTRAVENOUS

## 2021-05-24 MED ORDER — GABAPENTIN 300 MG PO CAPS
ORAL_CAPSULE | ORAL | Status: AC
  Administered 2021-05-24: 300 mg via ORAL
  Filled 2021-05-24: qty 1

## 2021-05-24 MED ORDER — CHLORHEXIDINE GLUCONATE 0.12 % MT SOLN: 15.0000 mL | Freq: Once | OROMUCOSAL | Status: AC

## 2021-05-24 MED ORDER — BUPIVACAINE-EPINEPHRINE (PF) 0.25% -1:200000 IJ SOLN
INTRAMUSCULAR | Status: AC
  Filled 2021-05-24: qty 30

## 2021-05-24 MED ORDER — OXYCODONE HCL 5 MG/5ML PO SOLN: 5.0000 mg | Freq: Once | ORAL | Status: AC | PRN

## 2021-05-24 MED ORDER — ACETAMINOPHEN 500 MG PO TABS
ORAL_TABLET | ORAL | Status: AC
  Administered 2021-05-24: 1000 mg via ORAL
  Filled 2021-05-24: qty 2

## 2021-05-24 MED ORDER — DEXAMETHASONE SODIUM PHOSPHATE 10 MG/ML IJ SOLN
INTRAMUSCULAR | Status: AC
  Filled 2021-05-24: qty 1

## 2021-05-24 MED ORDER — PROPOFOL 10 MG/ML IV BOLUS
INTRAVENOUS | Status: AC
  Filled 2021-05-24: qty 20

## 2021-05-24 MED ORDER — CELECOXIB 200 MG PO CAPS
ORAL_CAPSULE | ORAL | Status: AC
  Administered 2021-05-24: 200 mg via ORAL
  Filled 2021-05-24: qty 1

## 2021-05-24 MED ORDER — FENTANYL CITRATE (PF) 100 MCG/2ML IJ SOLN
INTRAMUSCULAR | Status: AC
  Administered 2021-05-24: 25 ug via INTRAVENOUS
  Filled 2021-05-24: qty 2

## 2021-05-24 MED ORDER — ORAL CARE MOUTH RINSE: 15.0000 mL | Freq: Once | OROMUCOSAL | Status: AC

## 2021-05-24 MED ORDER — PROPOFOL 10 MG/ML IV BOLUS
INTRAVENOUS | Status: DC | PRN
  Administered 2021-05-24: 100 mg via INTRAVENOUS

## 2021-05-24 MED ORDER — SUGAMMADEX SODIUM 200 MG/2ML IV SOLN
INTRAVENOUS | Status: DC | PRN
  Administered 2021-05-24: 200 mg via INTRAVENOUS

## 2021-05-24 MED ORDER — APREPITANT 40 MG PO CAPS
ORAL_CAPSULE | ORAL | Status: AC
  Administered 2021-05-24: 40 mg via ORAL
  Filled 2021-05-24: qty 1

## 2021-05-24 SURGICAL SUPPLY — 59 items
ADH SKN CLS APL DERMABOND .7 (GAUZE/BANDAGES/DRESSINGS) ×1
APL PRP STRL LF DISP 70% ISPRP (MISCELLANEOUS) ×1
BAG SPEC RTRVL LRG 6X4 10 (ENDOMECHANICALS) ×1
BULB RESERV EVAC DRAIN JP 100C (MISCELLANEOUS) ×2 IMPLANT
CANISTER SUCT 1200ML W/VALVE (MISCELLANEOUS) IMPLANT
CANNULA REDUC XI 12-8 STAPL (CANNULA) ×1
CANNULA REDUCER 12-8 DVNC XI (CANNULA) ×1 IMPLANT
CHLORAPREP W/TINT 26 (MISCELLANEOUS) ×2 IMPLANT
CLIP VESOLOCK LG 6/CT PURPLE (CLIP) ×2 IMPLANT
CLIP VESOLOCK MED LG 6/CT (CLIP) ×2 IMPLANT
DECANTER SPIKE VIAL GLASS SM (MISCELLANEOUS) ×2 IMPLANT
DEFOGGER SCOPE WARMER CLEARIFY (MISCELLANEOUS) ×2 IMPLANT
DERMABOND ADVANCED (GAUZE/BANDAGES/DRESSINGS) ×1
DERMABOND ADVANCED .7 DNX12 (GAUZE/BANDAGES/DRESSINGS) ×1 IMPLANT
DRAIN CHANNEL JP 19F (MISCELLANEOUS) ×2 IMPLANT
DRAPE ARM DVNC X/XI (DISPOSABLE) ×4 IMPLANT
DRAPE COLUMN DVNC XI (DISPOSABLE) ×1 IMPLANT
DRAPE DA VINCI XI ARM (DISPOSABLE) ×4
DRAPE DA VINCI XI COLUMN (DISPOSABLE) ×1
ELECT CAUTERY BLADE 6.4 (BLADE) ×2 IMPLANT
ELECT REM PT RETURN 9FT ADLT (ELECTROSURGICAL) ×2
ELECTRODE REM PT RTRN 9FT ADLT (ELECTROSURGICAL) ×1 IMPLANT
GAUZE 4X4 16PLY ~~LOC~~+RFID DBL (SPONGE) IMPLANT
GLOVE SURG ENC MOIS LTX SZ7 (GLOVE) ×12 IMPLANT
GOWN STRL REUS W/ TWL LRG LVL3 (GOWN DISPOSABLE) ×4 IMPLANT
GOWN STRL REUS W/TWL LRG LVL3 (GOWN DISPOSABLE) ×8
IRRIGATION STRYKERFLOW (MISCELLANEOUS) ×1 IMPLANT
IRRIGATOR STRYKERFLOW (MISCELLANEOUS) ×2
IV NS 1000ML (IV SOLUTION) ×2
IV NS 1000ML BAXH (IV SOLUTION) ×1 IMPLANT
KIT PINK PAD W/HEAD ARE REST (MISCELLANEOUS) ×2
KIT PINK PAD W/HEAD ARM REST (MISCELLANEOUS) ×1 IMPLANT
LABEL OR SOLS (LABEL) ×2 IMPLANT
MANIFOLD NEPTUNE II (INSTRUMENTS) ×2 IMPLANT
NEEDLE HYPO 22GX1.5 SAFETY (NEEDLE) ×2 IMPLANT
NS IRRIG 500ML POUR BTL (IV SOLUTION) ×2 IMPLANT
OBTURATOR OPTICAL STANDARD 8MM (TROCAR) ×1
OBTURATOR OPTICAL STND 8 DVNC (TROCAR) ×1
OBTURATOR OPTICALSTD 8 DVNC (TROCAR) ×1 IMPLANT
PACK LAP CHOLECYSTECTOMY (MISCELLANEOUS) ×2 IMPLANT
PENCIL ELECTRO HAND CTR (MISCELLANEOUS) ×2 IMPLANT
POUCH SPECIMEN RETRIEVAL 10MM (ENDOMECHANICALS) ×2 IMPLANT
SEAL CANN UNIV 5-8 DVNC XI (MISCELLANEOUS) ×3 IMPLANT
SEAL XI 5MM-8MM UNIVERSAL (MISCELLANEOUS) ×3
SET TUBE SMOKE EVAC HIGH FLOW (TUBING) ×2 IMPLANT
SOLUTION ELECTROLUBE (MISCELLANEOUS) ×2 IMPLANT
SPONGE DRAIN TRACH 4X4 STRL 2S (GAUZE/BANDAGES/DRESSINGS) ×2 IMPLANT
SPONGE T-LAP 18X18 ~~LOC~~+RFID (SPONGE) IMPLANT
SPONGE T-LAP 4X18 ~~LOC~~+RFID (SPONGE) ×2 IMPLANT
STAPLER CANNULA SEAL DVNC XI (STAPLE) ×1 IMPLANT
STAPLER CANNULA SEAL XI (STAPLE) ×1
SUT ETHILON 3-0 FS-10 30 BLK (SUTURE) ×2
SUT MNCRL AB 4-0 PS2 18 (SUTURE) ×2 IMPLANT
SUT VICRYL 0 AB UR-6 (SUTURE) ×4 IMPLANT
SUTURE EHLN 3-0 FS-10 30 BLK (SUTURE) ×1 IMPLANT
SYR 20ML LL LF (SYRINGE) ×2 IMPLANT
SYR 30ML LL (SYRINGE) ×2 IMPLANT
TAPE TRANSPORE STRL 2 31045 (GAUZE/BANDAGES/DRESSINGS) ×2 IMPLANT
TROCAR BALLN GELPORT 12X130M (ENDOMECHANICALS) ×2 IMPLANT

## 2021-05-24 NOTE — Interval H&P Note (Signed)
History and Physical Interval Note:  05/24/2021 7:55 AM  [name]  has presented today for surgery, with the diagnosis of biliary colie.  The various methods of treatment have been discussed with the patient and family. After consideration of risks, benefits and other options for treatment, the patient has consented to  Procedure(s) with comments: XI ROBOTIC ASSISTED LAPAROSCOPIC CHOLECYSTECTOMY (N/A) - Provider requesting 1.5 hours / 90 minutes for procedure. as a surgical intervention.  The patient's history has been reviewed, patient examined, no change in status, stable for surgery.  I have reviewed the patient's chart and labs.  Questions were answered to the patient's satisfaction.     [place]

## 2021-05-24 NOTE — Transfer of Care (Signed)
Immediate Anesthesia Transfer of Care Note  Patient: [name]  Procedure(s) Performed: XI ROBOTIC ASSISTED LAPAROSCOPIC CHOLECYSTECTOMY  Patient Location: PACU  Anesthesia Type:General  Level of Consciousness: drowsy  Airway & Oxygen Therapy: Patient Spontanous Breathing and Patient connected to face mask oxygen  Post-op Assessment: Report given to RN and Post -op Vital signs reviewed and stable  Post vital signs: Reviewed and stable  Last Vitals:  Vitals Value Taken Time  BP 171/60 05/24/21 1122  Temp    Pulse 71 05/24/21 1124  Resp 16 05/24/21 1124  SpO2 100 % 05/24/21 1124  Vitals shown include unvalidated device data.  Last Pain:  Vitals:   05/24/21 0700  TempSrc: Oral  PainSc: 3          Complications: No notable events documented.

## 2021-05-24 NOTE — Op Note (Addendum)
Robotic assisted laparoscopic Cholecystectomy with cholangiogram   Pre-operative Diagnosis: Chronic cholecystitis  Post-operative Diagnosis: same  Procedure:  Robotic assisted laparoscopic Cholecystectomy  Surgeon: Caroleen Hamman, MD FACS  Anesthesia: Gen. with endotracheal tube  Findings: Chronic severe Cholecystitis With severe inflammatory response Normal cholangiogram w/o filling defects   Estimated Blood Loss: 50 cc       Specimens: Gallbladder           Complications: none   Procedure Details  The patient was seen again in the Holding Room. The benefits, complications, treatment options, and expected outcomes were discussed with the patient. The risks of bleeding, infection, recurrence of symptoms, failure to resolve symptoms, bile duct damage, bile duct leak, retained common bile duct stone, bowel injury, any of which could require further surgery and/or ERCP, stent, or papillotomy were reviewed with the patient. The likelihood of improving the patient's symptoms with return to their baseline status is good.  The patient and/or family concurred with the proposed plan, giving informed consent.  The patient was taken to Operating Room, identified  and the procedure verified as Laparoscopic Cholecystectomy.  A Time Out was held and the above information confirmed.  Prior to the induction of general anesthesia, antibiotic prophylaxis was administered. VTE prophylaxis was in place. General endotracheal anesthesia was then administered and tolerated well. After the induction, the abdomen was prepped with Chloraprep and draped in the sterile fashion. The patient was positioned in the supine position. Nursing I date was 2 perform an intraoperative cholangiogram via an existing cholecystostomy tube.  Contra was injected and there was visualization of the cystic duct common bile duct and the contrast reached the duodenum there were no evidence of filling defects.  I went ahead and remove the  cholecystostomy tube in the standard fashion.  Patient was reprepped and draped. Cut down technique was used to enter the abdominal cavity and a Hasson trochar was placed after two vicryl stitches were anchored to the fascia. There was evidence of prior mesh and laparotomy. No evidence of bowel injuries observed. Pneumoperitoneum was then created with CO2 and tolerated well without any adverse changes in the patient's vital signs.  Three 8-mm ports were placed under direct vision. The patient was positioned  in reverse Trendelenburg, robot was brought to the surgical field and docked in the standard fashion.  We made sure all the instrumentation was kept indirect view at all times and that there were no collision between the arms. I scrubbed out and went to the console.  The gallbladder was identified, the fundus grasped and retracted cephalad. Adhesions were lysed combination of blunt and sharp division.  There was significant inflammatory response around the infundibulum and there was a large gallstone.  Perform meticulous dissection to avoid any  injuries the infundibulum was grasped and retracted laterally, exposing the peritoneum overlying the triangle of Calot. This was then divided and exposed in a blunt fashion. An extended critical view of the cystic duct and cystic artery was obtained.  The cystic duct was clearly identified and bluntly dissected.   Artery and duct were double clipped and divided. Using ICG cholangiography we visualize the cystic duct and CBD, no evidence of bile injuries. The gallbladder was taken from the gallbladder fossa in a retrograde fashion with the electrocautery.  Hemostasis was achieved with the electrocautery. nspection of the right upper quadrant was performed. No bleeding, bile duct injury or leak, or bowel injury was noted. Robotic instruments and robotic arms were undocked in the standard fashion.  I scrubbed back in.  The gallbladder was removed and placed in an  Endocatch bag.  Of and inflammatory response and chronic infectious process with seropurulent fluid around the gallbladder I decided to place a 19 Blake drain in the standard fashion and secured to the skin using  Pneumoperitoneum was released.  The periumbilical port site was closed with interrumpted 0 Vicryl sutures. 4-0 subcuticular Monocryl was used to close the skin. Dermabond was  applied.   Liposomal Marcaine was infiltrated in all incision sites  The patient was then extubated and brought to the recovery room in stable condition. Sponge, lap, and needle counts were correct at closure and at the conclusion of the case.               Caroleen Hamman, MD, FACS

## 2021-05-24 NOTE — Anesthesia Procedure Notes (Signed)
Procedure Name: Intubation Date/Time: 05/24/2021 9:24 AM Performed by: Rolla Plate, CRNA Pre-anesthesia Checklist: Patient identified, Patient being monitored, Timeout performed, Emergency Drugs available and Suction available Patient Re-evaluated:Patient Re-evaluated prior to induction Oxygen Delivery Method: Circle system utilized Preoxygenation: Pre-oxygenation with 100% oxygen Induction Type: IV induction Ventilation: Mask ventilation without difficulty Laryngoscope Size: 3 and McGraph Grade View: Grade I Tube type: Oral Tube size: 7.0 mm Number of attempts: 1 Airway Equipment and Method: Video-laryngoscopy Placement Confirmation: ETT inserted through vocal cords under direct vision, positive ETCO2 and breath sounds checked- equal and bilateral Secured at: 21 cm Tube secured with: Tape Dental Injury: Teeth and Oropharynx as per pre-operative assessment

## 2021-05-24 NOTE — Discharge Instructions (Addendum)
Laparoscopic Cholecystectomy, Care After  ° °These instructions give you information on caring for yourself after your procedure. Your doctor may also give you more specific instructions. Call your doctor if you have any problems or questions after your procedure.  °HOME CARE  °Change your bandages (dressings) as told by your doctor.  °Keep the wound dry and clean. Wash the wound gently with soap and water. Pat the wound dry with a clean towel.  °Do not take baths, swim, or use hot tubs for 2 weeks, or as told by your doctor.  °Only take medicine as told by your doctor.  °Eat a normal diet as told by your doctor.  °Do not lift anything heavier than 10 pounds (4.5 kg) until your doctor says it is okay.  °Do not play contact sports for 1 week, or as told by your doctor. °GET HELP IF:  °Your wound is red, puffy (swollen), or painful.  °You have yellowish-white fluid (pus) coming from the wound.  °You have fluid draining from the wound for more than 1 day.  °You have a bad smell coming from the wound.  °Your wound breaks open. °GET HELP RIGHT AWAY IF:  °You have trouble breathing.  °You have chest pain.  °You have a fever >101  °You have pain in the shoulders (shoulder strap areas) that is getting worse.  °You feel dizzy or pass out (faint).  °You have severe belly (abdominal) pain.  °You feel sick to your stomach (nauseous) or throw up (vomit) for more than 1 day. ° °AMBULATORY SURGERY  °DISCHARGE INSTRUCTIONS ° ° °The drugs that you were given will stay in your system until tomorrow so for the next 24 hours you should not: ° °Drive an automobile °Make any legal decisions °Drink any alcoholic beverage ° ° °You may resume regular meals tomorrow.  Today it is better to start with liquids and gradually work up to solid foods. ° °You may eat anything you prefer, but it is better to start with liquids, then soup and crackers, and gradually work up to solid foods. ° ° °Please notify your doctor immediately if you have any  unusual bleeding, trouble breathing, redness and pain at the surgery site, drainage, fever, or pain not relieved by medication. ° ° ° °Additional Instructions: °Please contact your physician with any problems or Same Day Surgery at [phone], Monday through Friday 6 am to 4 pm, or [place] [facility] Main number at [phone].  °  °

## 2021-05-24 NOTE — Anesthesia Preprocedure Evaluation (Signed)
Anesthesia Evaluation  Patient identified by MRN, date of birth, ID band Patient awake    Reviewed: Allergy & Precautions, NPO status , Patient's Chart, lab work & pertinent test results  History of Anesthesia Complications (+) PONV and history of anesthetic complications  Airway Mallampati: II  TM Distance: >3 FB Neck ROM: Full    Dental  (+) Partial Upper   Pulmonary neg pulmonary ROS, neg sleep apnea, neg COPD,    breath sounds clear to auscultation- rhonchi (-) wheezing      Cardiovascular hypertension, Pt. on medications (-) CAD, (-) Past MI, (-) Cardiac Stents and (-) CABG  Rhythm:Regular Rate:Normal - Systolic murmurs and - Diastolic murmurs    Neuro/Psych Seizures -, Well Controlled,  negative psych ROS   GI/Hepatic negative GI ROS, Neg liver ROS,   Endo/Other  diabetes, Insulin Dependent  Renal/GU negative Renal ROS     Musculoskeletal  (+) Arthritis ,   Abdominal (+) - obese,   Peds  Hematology negative hematology ROS (+)   Anesthesia Other Findings Past Medical History: No date: Allergy 05/08/2015: Arthritis of knee, degenerative 03/2019: Basal cell carcinoma     Comment:  nose 2014: Colon cancer ([place])     Comment:  Partial colon resection, chemo + rad tx's.  No date: Complication of anesthesia No date: Diabetes mellitus without complication (HCC)     Comment:  Pt takes Metformin. No date: Hyperlipidemia No date: Hypertension No date: PONV (postoperative nausea and vomiting) 04/20/2018: SBO (small bowel obstruction) (HCC) No date: Seizures ([place]) No date: Umbilical hernia with obstruction but no gangrene No date: Vitamin D deficiency   Reproductive/Obstetrics                             Anesthesia Physical Anesthesia Plan  ASA: 3  Anesthesia Plan: General   Post-op Pain Management:    Induction: Intravenous  PONV Risk Score and Plan: 3 and Ondansetron,  Dexamethasone and TIVA  Airway Management Planned: Oral ETT  Additional Equipment:   Intra-op Plan:   Post-operative Plan: Extubation in OR  Informed Consent: I have reviewed the patients History and Physical, chart, labs and discussed the procedure including the risks, benefits and alternatives for the proposed anesthesia with the patient or authorized representative who has indicated his/her understanding and acceptance.     Dental advisory given  Plan Discussed with: CRNA and Anesthesiologist  Anesthesia Plan Comments:         Anesthesia Quick Evaluation

## 2021-05-27 LAB — SURGICAL PATHOLOGY

## 2021-05-27 NOTE — Anesthesia Postprocedure Evaluation (Signed)
Anesthesia Post Note  Patient: AZRIELLA MATTIA  Procedure(s) Performed: XI ROBOTIC ASSISTED LAPAROSCOPIC CHOLECYSTECTOMY  Patient location during evaluation: PACU Anesthesia Type: General Level of consciousness: awake and alert and oriented Pain management: pain level controlled Vital Signs Assessment: post-procedure vital signs reviewed and stable Respiratory status: spontaneous breathing, nonlabored ventilation and respiratory function stable Cardiovascular status: blood pressure returned to baseline and stable Postop Assessment: no signs of nausea or vomiting Anesthetic complications: no   No notable events documented.   Last Vitals:  Vitals:   05/24/21 1330 05/24/21 1450  BP: (!) 134/58 (!) 158/55  Pulse: 77 73  Resp: 18 17  Temp: (!) 36.1 C   SpO2: 99% 99%    Last Pain:  Vitals:   05/24/21 1450  TempSrc:   PainSc: 5                  Mariame Rybolt

## 2021-06-03 ENCOUNTER — Encounter: Payer: Self-pay | Admitting: Surgery

## 2021-06-03 ENCOUNTER — Other Ambulatory Visit: Payer: Self-pay

## 2021-06-03 ENCOUNTER — Ambulatory Visit (INDEPENDENT_AMBULATORY_CARE_PROVIDER_SITE_OTHER): Payer: Medicare Other | Admitting: Surgery

## 2021-06-03 VITALS — BP 148/73 | HR 78 | Temp 97.8°F | Ht 62.0 in | Wt 149.0 lb

## 2021-06-03 DIAGNOSIS — Z09 Encounter for follow-up examination after completed treatment for conditions other than malignant neoplasm: Secondary | ICD-10-CM

## 2021-06-03 NOTE — Progress Notes (Signed)
[name] status post robotic cholecystectomy.  I left a drain due to active infection inflammation.  She is doing very well.  No fevers no chills minimal output from the drain   PE NAD Abd: JP drain with serous fluid.  Incisions healing well without infection.  No peritonitis  A/P doing very well after cholecystectomy considering her disease.  RTC as needed

## 2021-06-03 NOTE — Patient Instructions (Addendum)
If you have any concerns or questions, please feel free to call our office.   GENERAL POST-OPERATIVE PATIENT INSTRUCTIONS   WOUND CARE INSTRUCTIONS:  Keep a dry clean dressing on the wound if there is drainage. The initial bandage may be removed after 24 hours.  Once the wound has quit draining you may leave it open to air.  If clothing rubs against the wound or causes irritation and the wound is not draining you may cover it with a dry dressing during the daytime.  Try to keep the wound dry and avoid ointments on the wound unless directed to do so.  If the wound becomes bright red and painful or starts to drain infected material that is not clear, please contact your physician immediately.  If the wound is mildly pink and has a thick firm ridge underneath it, this is normal, and is referred to as a healing ridge.  This will resolve over the next 4-6 weeks.  BATHING: You may shower if you have been informed of this by your surgeon. However, Please do not submerge in a tub, hot tub, or pool until incisions are completely sealed or have been told by your surgeon that you may do so.  DIET:  You may eat any foods that you can tolerate.  It is a good idea to eat a high fiber diet and take in plenty of fluids to prevent constipation.  If you do become constipated you may want to take a mild laxative or take ducolax tablets on a daily basis until your bowel habits are regular.  Constipation can be very uncomfortable, along with straining, after recent surgery.  ACTIVITY:  You are encouraged to cough and deep breath or use your incentive spirometer if you were given one, every 15-30 minutes when awake.  This will help prevent respiratory complications and low grade fevers post-operatively if you had a general anesthetic.  You may want to hug a pillow when coughing and sneezing to add additional support to the surgical area, if you had abdominal or chest surgery, which will decrease pain during these times.  You  are encouraged to walk and engage in light activity for the next two weeks.  You should not lift more than 15 pounds, until 06/21/2021 as it could put you at increased risk for complications.  Twenty pounds is roughly equivalent to a plastic bag of groceries. At that time- Listen to your body when lifting, if you have pain when lifting, stop and then try again in a few days. Soreness after doing exercises or activities of daily living is normal as you get back in to your normal routine.  MEDICATIONS:  Try to take narcotic medications and anti-inflammatory medications, such as tylenol, ibuprofen, naprosyn, etc., with food.  This will minimize stomach upset from the medication.  Should you develop nausea and vomiting from the pain medication, or develop a rash, please discontinue the medication and contact your physician.  You should not drive, make important decisions, or operate machinery when taking narcotic pain medication.  SUNBLOCK Use sun block to incision area over the next year if this area will be exposed to sun. This helps decrease scarring and will allow you avoid a permanent darkened area over your incision.   Gallbladder Eating Plan  If you have a gallbladder condition, you may have trouble digesting fats. Eating a low-fat diet can help reduce your symptoms, and may be helpful before and after having surgery to remove your gallbladder (cholecystectomy). Your health  care provider may recommend that you work with a diet and nutrition specialist (dietitian) to help you reduce the amount of fat in your diet. What are tips for following this plan? General guidelines Limit your fat intake to less than 30% of your total daily calories. If you eat around 1,800 calories each day, this is less than 60 grams (g) of fat per day. Fat is an important part of a healthy diet. Eating a low-fat diet can make it hard to maintain a healthy body weight. Ask your dietitian how much fat, calories, and other  nutrients you need each day. Eat small, frequent meals throughout the day instead of three large meals. Drink at least 8-10 cups of fluid a day. Drink enough fluid to keep your urine clear or pale yellow. Limit alcohol intake to no more than 1 drink a day for nonpregnant women and 2 drinks a day for men. One drink equals 12 oz of beer, 5 oz of wine, or 1 oz of hard liquor. Reading food labels  Check Nutrition Facts on food labels for the amount of fat per serving. Choose foods with less than 3 grams of fat per serving.  Shopping Choose nonfat and low-fat healthy foods. Look for the words "nonfat," "low fat," or "fat free." Avoid buying processed or prepackaged foods. Cooking Cook using low-fat methods, such as baking, broiling, grilling, or boiling. Cook with small amounts of healthy fats, such as olive oil, grapeseed oil, canola oil, or sunflower oil. What foods are recommended? All fresh, frozen, or canned fruits and vegetables. Whole grains. Low-fat or non-fat (skim) milk and yogurt. Lean meat, skinless poultry, fish, eggs, and beans. Low-fat protein supplement powders or drinks. Spices and herbs. What foods are not recommended? High-fat foods. These include baked goods, fast food, fatty cuts of meat, ice cream, french toast, sweet rolls, pizza, cheese bread, foods covered with butter, creamy sauces, or cheese. Fried foods. These include french fries, tempura, battered fish, breaded chicken, fried breads, and sweets. Foods with strong odors. Foods that cause bloating and gas. Summary A low-fat diet can be helpful if you have a gallbladder condition, or before and after gallbladder surgery. Limit your fat intake to less than 30% of your total daily calories. This is about 60 g of fat if you eat 1,800 calories each day. Eat small, frequent meals throughout the day instead of three large meals. This information is not intended to replace advice given to you by your health care  provider. Make sure you discuss any questions you have with your healthcare provider. Document Revised: 06/21/2020 Document Reviewed: 06/21/2020 Elsevier Patient Education  2022 [employer].

## 2021-07-08 DIAGNOSIS — E119 Type 2 diabetes mellitus without complications: Secondary | ICD-10-CM | POA: Diagnosis not present

## 2021-07-08 DIAGNOSIS — B351 Tinea unguium: Secondary | ICD-10-CM | POA: Diagnosis not present

## 2021-07-08 DIAGNOSIS — L851 Acquired keratosis [keratoderma] palmaris et plantaris: Secondary | ICD-10-CM | POA: Diagnosis not present

## 2021-07-25 ENCOUNTER — Other Ambulatory Visit: Payer: Self-pay | Admitting: Internal Medicine

## 2021-07-25 DIAGNOSIS — G40909 Epilepsy, unspecified, not intractable, without status epilepticus: Secondary | ICD-10-CM

## 2021-08-01 ENCOUNTER — Encounter: Payer: Self-pay | Admitting: Internal Medicine

## 2021-08-01 ENCOUNTER — Ambulatory Visit (INDEPENDENT_AMBULATORY_CARE_PROVIDER_SITE_OTHER): Payer: Medicare Other | Admitting: Internal Medicine

## 2021-08-01 ENCOUNTER — Other Ambulatory Visit: Payer: Self-pay

## 2021-08-01 VITALS — BP 158/82 | HR 58 | Temp 97.8°F | Ht 62.0 in | Wt 151.0 lb

## 2021-08-01 DIAGNOSIS — I10 Essential (primary) hypertension: Secondary | ICD-10-CM | POA: Diagnosis not present

## 2021-08-01 DIAGNOSIS — Z23 Encounter for immunization: Secondary | ICD-10-CM | POA: Diagnosis not present

## 2021-08-01 DIAGNOSIS — D126 Benign neoplasm of colon, unspecified: Secondary | ICD-10-CM

## 2021-08-01 DIAGNOSIS — E1169 Type 2 diabetes mellitus with other specified complication: Secondary | ICD-10-CM

## 2021-08-01 DIAGNOSIS — E118 Type 2 diabetes mellitus with unspecified complications: Secondary | ICD-10-CM | POA: Diagnosis not present

## 2021-08-01 DIAGNOSIS — E785 Hyperlipidemia, unspecified: Secondary | ICD-10-CM

## 2021-08-01 LAB — POCT GLYCOSYLATED HEMOGLOBIN (HGB A1C): Hemoglobin A1C: 8.3 % — AB (ref 4.0–5.6)

## 2021-08-01 MED ORDER — RAMIPRIL 10 MG PO CAPS: 10.0000 mg | ORAL_CAPSULE | Freq: Every day | ORAL | 1 refills | Status: DC

## 2021-08-01 NOTE — Progress Notes (Signed)
Date:  08/01/2021   Name:  [name]   DOB:  [birth year]   MRN:  [id number]   Chief Complaint: Diabetes (Last BS this morning 128), Hypertension, and Flu Vaccine  Diabetes She presents for her follow-up diabetic visit. She has type 2 diabetes mellitus. Her disease course has been stable. Pertinent negatives for hypoglycemia include no nervousness/anxiousness. Associated symptoms include fatigue. Pertinent negatives for diabetes include no chest pain. Current diabetic treatment includes insulin injections. She is compliant with treatment most of the time (sometimes forgets the morning dose). Her weight is stable. Her home blood glucose trend is fluctuating minimally. An ACE inhibitor/angiotensin II receptor blocker is being taken.  Hypertension This is a chronic problem. The problem is resistant. Pertinent negatives include no chest pain. Past treatments include ACE inhibitors and beta blockers. The current treatment provides moderate improvement.  Hx colon cancer/recent adenomatous polyp - last colonoscopy was in April - one large polyp was removed but the one of concern was not seen.  She was due to return in July but instead had a cholecystomy tube then cholecystectomy.  She is just how feeling better but is worried that she delayed the follow up.  Lab Results  Component Value Date   CREATININE 0.95 04/23/2021   BUN 14 04/23/2021   NA 132 (L) 04/23/2021   K 4.3 04/23/2021   CL 100 04/23/2021   CO2 26 04/23/2021   Lab Results  Component Value Date   CHOL 204 (H) 12/25/2020   HDL 99 12/25/2020   LDLCALC 93 12/25/2020   TRIG 67 12/25/2020   CHOLHDL 2.1 12/25/2020   Lab Results  Component Value Date   TSH 1.710 12/25/2020   Lab Results  Component Value Date   HGBA1C 8.3 (A) 08/01/2021   Lab Results  Component Value Date   WBC 8.0 04/23/2021   HGB 11.6 (L) 04/23/2021   HCT 35.6 (L) 04/23/2021   MCV 90.1 04/23/2021   PLT 249 04/23/2021   Lab Results  Component  Value Date   ALT 15 04/23/2021   AST 18 04/23/2021   ALKPHOS 98 04/23/2021   BILITOT 0.5 04/23/2021     Review of Systems  Constitutional:  Positive for fatigue. Negative for chills, diaphoresis, fever and unexpected weight change.  HENT:  Negative for trouble swallowing.   Respiratory:  Negative for chest tightness and wheezing.   Cardiovascular:  Negative for chest pain and leg swelling.  Gastrointestinal:  Positive for diarrhea (mild and intermittent; unchanged). Negative for abdominal pain and blood in stool.  Musculoskeletal:  Positive for arthralgias and gait problem (uses walker).  Psychiatric/Behavioral:  Negative for dysphoric mood and sleep disturbance. The patient is not nervous/anxious.    Patient Active Problem List   Diagnosis Date Noted   Mood disorder ([place]) 12/21/2019   Primary osteoarthritis of both hips 06/10/2019   Bilateral carotid artery stenosis 08/24/2018   Atherosclerosis of aorta ([place]) 08/24/2018   Type II diabetes mellitus with complication ([place]) [id number]   Bradycardia 07/24/2018   Neoplasm of uncertain behavior of skin 01/16/2017   Senile ecchymosis 08/20/2015   Essential (primary) hypertension 05/08/2015   History of rectal cancer 05/08/2015   Hyperlipidemia associated with type 2 diabetes mellitus ([place]) 05/08/2015   Seizure disorder ([place]) 05/08/2015   Shoulder strain 05/08/2015   Avitaminosis D 05/08/2015    Allergies  Allergen Reactions   Baclofen Other (See Comments)    Hallucinations   Pioglitazone Nausea Only    Past Surgical History:  Procedure Laterality Date   CATARACT EXTRACTION, BILATERAL  01/2020   Dr. Lucianne Lei [facility]   COLOSTOMY REVERSAL  01/2014   ILEOSTOMY  [id number]   UMBILICAL HERNIA REPAIR  05/2018    Social History   Tobacco Use   Smoking status: Never   Smokeless tobacco: Never  Vaping Use   Vaping Use: Never used  Substance Use Topics   Alcohol use: No    Alcohol/week: 0.0 standard drinks   Drug use: No      Medication list has been reviewed and updated.  Current Meds  Medication Sig   aspirin 81 MG chewable tablet Chew 81 mg by mouth daily.    atorvastatin (LIPITOR) 40 MG tablet TAKE 1 TABLET BY MOUTH DAILY (Patient taking differently: Take 40 mg by mouth daily.)   Bacillus Coagulans-Inulin (PROBIOTIC) 1-250 BILLION-MG CAPS Take 1 capsule by mouth daily.   calcium carbonate (OSCAL) 1500 (600 Ca) MG TABS tablet Take 600 mg of elemental calcium by mouth daily.   carbamazepine (TEGRETOL) 200 MG tablet TAKE 1 TABLET(200 MG) BY MOUTH DAILY   Cholecalciferol 25 MCG (1000 UT) capsule Take 1,000 Units by mouth daily.    docusate sodium (COLACE) 250 MG capsule Take 250 mg by mouth daily.   Fluocinolone Acetonide 0.01 % OIL Place 1 drop into both ears at bedtime. Alternating ears each night   HYDROcodone-acetaminophen (NORCO/VICODIN) 5-325 MG tablet Take 1-2 tablets by mouth every 6 (six) hours as needed for moderate pain.   insulin glargine (LANTUS SOLOSTAR) 100 UNIT/ML Solostar Pen Inject 10 Units into the skin every morning AND 8 Units every evening. (Patient taking differently: Inject 11 Units into the skin every morning AND 9 Units every evening.)   metoprolol succinate (TOPROL-XL) 25 MG 24 hr tablet Take 12.5 mg by mouth 2 (two) times daily.   nitroGLYCERIN (NITROSTAT) 0.4 MG SL tablet Place 1 tablet (0.4 mg total) under the tongue every 5 (five) minutes x 3 doses as needed for chest pain.   ONETOUCH ULTRA test strip USE 1 STRIP TO CHECK GLUCOSE TWICE DAILY   pantoprazole (PROTONIX) 40 MG tablet Take 1 tablet (40 mg total) by mouth daily as needed (heartburn).   Propylene Glycol (SYSTANE BALANCE OP) Place 1 drop into both eyes 2 (two) times daily as needed (dry eyes).   trolamine salicylate (ASPERCREME) 10 % cream Apply 1 application topically as needed for muscle pain.   [DISCONTINUED] insulin detemir (LEVEMIR) 100 UNIT/ML FlexPen Inject into the skin.   [DISCONTINUED] ramipril (ALTACE) 5 MG  capsule TAKE 1 CAPSULE BY MOUTH DAILY (Patient taking differently: Take 5 mg by mouth daily. TAKE 1 CAPSULE BY MOUTH DAILY)    PHQ 2/9 Scores 08/01/2021 04/29/2021 04/12/2021 12/25/2020  PHQ - 2 Score 0 '2 4 4  '$ PHQ- 9 Score '5 5 11 6    '$ GAD 7 : Generalized Anxiety Score 08/01/2021 04/29/2021 04/12/2021 12/25/2020  Nervous, Anxious, on Edge 1 0 2 2  Control/stop worrying 0 0 2 2  Worry too much - different things 0 0 2 2  Trouble relaxing 1 0 3 3  Restless 0 0 3 3  Easily annoyed or irritable 0 0 2 0  Afraid - awful might happen 1 0 2 0  Total GAD 7 Score 3 0 16 12  Anxiety Difficulty - - - Not difficult at all    BP Readings from Last 3 Encounters:  08/01/21 (!) 158/82  06/03/21 (!) 148/73  05/24/21 (!) 158/55    Physical Exam Vitals and  nursing note reviewed.  Constitutional:      General: She is not in acute distress.    Appearance: She is well-developed.  HENT:     Head: Normocephalic and atraumatic.  Cardiovascular:     Rate and Rhythm: Normal rate and regular rhythm.     Heart sounds: No murmur heard. Pulmonary:     Effort: Pulmonary effort is normal. No respiratory distress.     Breath sounds: No wheezing or rhonchi.  Musculoskeletal:     Right lower leg: No edema.     Left lower leg: No edema.  Skin:    General: Skin is warm and dry.     Capillary Refill: Capillary refill takes less than 2 seconds.     Findings: No rash.  Neurological:     General: No focal deficit present.     Mental Status: She is alert and oriented to person, place, and time.     Gait: Gait abnormal.  Psychiatric:        Mood and Affect: Mood normal.        Behavior: Behavior normal.    Wt Readings from Last 3 Encounters:  08/01/21 151 lb (68.5 kg)  06/03/21 149 lb (67.6 kg)  05/24/21 149 lb 14.6 oz (68 kg)    BP (!) 158/82   Pulse (!) 58   Temp 97.8 F (36.6 C) (Oral)   Ht '5\' 2"'$  (1.575 m)   Wt 151 lb (68.5 kg)   SpO2 97%   BMI 27.62 kg/m   Assessment and Plan: 1. Type II diabetes  mellitus with complication (HCC) [id number] is higher - likely due to stress of recent surgery, etc. Will continue bid Lantus for now.  Consider adding SGLT-2 next visit if not improved. Try to dose bid insulin daily. - POCT glycosylated hemoglobin (Hb A1C) = 8.3  2. Essential (primary) hypertension BP not well controlled. Home readings also elevated. Will continue metoprolol but double Ramipril to 10 mg daily. Recheck in 2 months - ramipril (ALTACE) 10 MG capsule; Take 1 capsule (10 mg total) by mouth daily.  Dispense: 90 capsule; Refill: 1  3. Hyperlipidemia associated with type 2 diabetes mellitus ([place]) On atorvastatin  4. Adenomatous polyp of colon, unspecified part of colon Pt urged to contact [facility] to discuss scheduling follow up colonoscopy in view of her recent surgery.  They should be able to recommend when it is safe to perform.  5. Need for immunization against influenza - Flu Vaccine QUAD High Dose(Fluad)   Partially dictated using Editor, commissioning. Any errors are unintentional.  Halina Maidens, MD [facility]  08/01/2021

## 2021-08-01 NOTE — Patient Instructions (Addendum)
Increase the Ramipril to 2 capsules daily (to equal 10 mg).  When the current supply runs out, take the new prescription which will be for a 10 mg capsules.

## 2021-09-01 ENCOUNTER — Other Ambulatory Visit: Payer: Self-pay | Admitting: Internal Medicine

## 2021-09-01 NOTE — Telephone Encounter (Signed)
Requested medication (s) are due for refill today: -  Requested medication (s) are on the active medication list: historical med  Last refill:  04/29/21  Future visit scheduled: yes  Notes to clinic:  historical provider   Requested Prescriptions  Pending Prescriptions Disp Refills   metoprolol succinate (TOPROL-XL) 25 MG 24 hr tablet [Pharmacy Med Name: METOPROLOL ER SUCCINATE 25MG  TABS] 90 tablet     Sig: TAKE 1/2 TABLET(12.5 MG) BY MOUTH IN THE MORNING AND AT BEDTIME     Cardiovascular:  Beta Blockers Failed - 09/01/2021  8:09 AM      Failed - Last BP in normal range    BP Readings from Last 1 Encounters:  08/01/21 (!) 158/82          Passed - Last Heart Rate in normal range    Pulse Readings from Last 1 Encounters:  08/01/21 (!) 61          Passed - Valid encounter within last 6 months    Recent Outpatient Visits           1 month ago Type II diabetes mellitus with complication [facility])   [facility] Glean Hess, MD   4 months ago Type II diabetes mellitus with complication [facility])   [facility] Glean Hess, MD   4 months ago Type II diabetes mellitus with complication [facility])   [facility] Glean Hess, MD   8 months ago Annual physical exam   [facility] Glean Hess, MD   1 year ago Type II diabetes mellitus with complication [facility])   [facility] Glean Hess, MD       Future Appointments             In 1 month Army Melia Jesse Sans, MD [facility], [place]   In 3 months Army Melia, Jesse Sans, MD [facility], [facility]

## 2021-09-03 ENCOUNTER — Other Ambulatory Visit: Payer: Self-pay | Admitting: Internal Medicine

## 2021-09-03 LAB — HM COLONOSCOPY

## 2021-09-03 MED ORDER — METOPROLOL SUCCINATE ER 25 MG PO TB24: ORAL_TABLET | ORAL | 0 refills | Status: DC

## 2021-09-03 NOTE — Telephone Encounter (Signed)
Please advise if okay to fill for 90 days.  Next appointment 10/01/21.

## 2021-09-03 NOTE — Telephone Encounter (Signed)
Requested medication (s) are due for refill today - no  Requested medication (s) are on the active medication list -yes  Future visit scheduled -yes  Last refill: today  Notes to clinic: Pharmacy request changes to Rx sent by office today- 90 day supply (#30 only sent today)  Requested Prescriptions  Pending Prescriptions Disp Refills   metoprolol succinate (TOPROL-XL) 25 MG 24 hr tablet [Pharmacy Med Name: METOPROLOL ER SUCCINATE 25MG  TABS] 90 tablet     Sig: TAKE 1/2 TABLET(12.5 MG) BY MOUTH IN THE MORNING AND AT BEDTIME     Cardiovascular:  Beta Blockers Failed - 09/03/2021 10:21 AM      Failed - Last BP in normal range    BP Readings from Last 1 Encounters:  08/01/21 (!) 158/82          Passed - Last Heart Rate in normal range    Pulse Readings from Last 1 Encounters:  08/01/21 (!) 58          Passed - Valid encounter within last 6 months    Recent Outpatient Visits           1 month ago Type II diabetes mellitus with complication [facility])   [facility] Glean Hess, MD   4 months ago Type II diabetes mellitus with complication [facility])   [facility] Glean Hess, MD   4 months ago Type II diabetes mellitus with complication [facility])   [facility] Glean Hess, MD   8 months ago Annual physical exam   [facility] Glean Hess, MD   1 year ago Type II diabetes mellitus with complication [facility])   [facility] Glean Hess, MD       Future Appointments             In 4 weeks Glean Hess, MD [facility], [place]   In 3 months Glean Hess, MD [facility], [facility]               Requested Prescriptions  Pending Prescriptions Disp Refills   metoprolol succinate (TOPROL-XL) 25 MG 24 hr tablet [Pharmacy Med Name: METOPROLOL ER SUCCINATE 25MG  TABS] 90 tablet     Sig: TAKE 1/2 TABLET(12.5 MG) BY MOUTH IN THE MORNING AND AT BEDTIME     Cardiovascular:  Beta Blockers Failed -  09/03/2021 10:21 AM      Failed - Last BP in normal range    BP Readings from Last 1 Encounters:  08/01/21 (!) 158/82          Passed - Last Heart Rate in normal range    Pulse Readings from Last 1 Encounters:  08/01/21 (!) 50          Passed - Valid encounter within last 6 months    Recent Outpatient Visits           1 month ago Type II diabetes mellitus with complication [facility])   [facility] Glean Hess, MD   4 months ago Type II diabetes mellitus with complication [facility])   [facility] Glean Hess, MD   4 months ago Type II diabetes mellitus with complication [facility])   [facility] Glean Hess, MD   8 months ago Annual physical exam   [facility] Glean [facility], [facility]   1 year ago Type II diabetes mellitus with complication [facility])   [facility] Glean Hess, MD  Future Appointments             In 4 weeks Army Melia Jesse Sans, MD [facility], [place]   In 3 months Army Melia Jesse Sans, MD [facility], [facility]

## 2021-09-04 ENCOUNTER — Other Ambulatory Visit: Payer: Self-pay | Admitting: Internal Medicine

## 2021-09-04 DIAGNOSIS — E1169 Type 2 diabetes mellitus with other specified complication: Secondary | ICD-10-CM

## 2021-09-04 DIAGNOSIS — E785 Hyperlipidemia, unspecified: Secondary | ICD-10-CM

## 2021-09-04 NOTE — Telephone Encounter (Signed)
Future OV 10/01/21.  Approved per protocol for 90 days. Requested Prescriptions  Pending Prescriptions Disp Refills  . atorvastatin (LIPITOR) 40 MG tablet [Pharmacy Med Name: ATORVASTATIN 40MG  TABLETS] 90 tablet 0    Sig: TAKE 1 TABLET BY MOUTH DAILY     Cardiovascular:  Antilipid - Statins Failed - 09/04/2021  9:22 AM      Failed - Total Cholesterol in normal range and within 360 days    Cholesterol, Total  Date Value Ref Range Status  12/25/2020 204 (H) 100 - 199 mg/dL Final         Passed - LDL in normal range and within 360 days    LDL Chol Calc (NIH)  Date Value Ref Range Status  12/25/2020 93 0 - 99 mg/dL Final         Passed - HDL in normal range and within 360 days    HDL  Date Value Ref Range Status  12/25/2020 99 >39 mg/dL Final         Passed - Triglycerides in normal range and within 360 days    Triglycerides  Date Value Ref Range Status  12/25/2020 67 0 - 149 mg/dL Final         Passed - Patient is not pregnant      Passed - Valid encounter within last 12 months    Recent Outpatient Visits          1 month ago Type II diabetes mellitus with complication [facility])   [facility] Glean Hess, MD   4 months ago Type II diabetes mellitus with complication [facility])   [facility] Glean Hess, MD   4 months ago Type II diabetes mellitus with complication [facility])   [facility] Glean Hess, MD   8 months ago Annual physical exam   [facility] Glean Hess, MD   1 year ago Type II diabetes mellitus with complication [facility])   [facility] Glean Hess, [facility]      Future Appointments            In 3 weeks Army Melia Jesse Sans, MD [facility], [place]   In 3 months Army Melia, Jesse Sans, MD [facility], [facility]

## 2021-09-13 DIAGNOSIS — Z79899 Other long term (current) drug therapy: Secondary | ICD-10-CM | POA: Diagnosis not present

## 2021-09-13 DIAGNOSIS — Z85048 Personal history of other malignant neoplasm of rectum, rectosigmoid junction, and anus: Secondary | ICD-10-CM | POA: Diagnosis not present

## 2021-09-13 DIAGNOSIS — Z9221 Personal history of antineoplastic chemotherapy: Secondary | ICD-10-CM | POA: Diagnosis not present

## 2021-09-13 DIAGNOSIS — E785 Hyperlipidemia, unspecified: Secondary | ICD-10-CM | POA: Diagnosis not present

## 2021-09-13 DIAGNOSIS — D124 Benign neoplasm of descending colon: Secondary | ICD-10-CM | POA: Diagnosis not present

## 2021-09-13 DIAGNOSIS — D369 Benign neoplasm, unspecified site: Secondary | ICD-10-CM | POA: Diagnosis not present

## 2021-09-13 DIAGNOSIS — E119 Type 2 diabetes mellitus without complications: Secondary | ICD-10-CM | POA: Diagnosis not present

## 2021-09-13 DIAGNOSIS — K5730 Diverticulosis of large intestine without perforation or abscess without bleeding: Secondary | ICD-10-CM | POA: Diagnosis not present

## 2021-09-13 DIAGNOSIS — I6523 Occlusion and stenosis of bilateral carotid arteries: Secondary | ICD-10-CM | POA: Diagnosis not present

## 2021-09-13 DIAGNOSIS — I10 Essential (primary) hypertension: Secondary | ICD-10-CM | POA: Diagnosis not present

## 2021-09-13 DIAGNOSIS — G40909 Epilepsy, unspecified, not intractable, without status epilepticus: Secondary | ICD-10-CM | POA: Diagnosis not present

## 2021-09-13 DIAGNOSIS — Z86010 Personal history of colonic polyps: Secondary | ICD-10-CM | POA: Diagnosis not present

## 2021-09-13 DIAGNOSIS — Z7982 Long term (current) use of aspirin: Secondary | ICD-10-CM | POA: Diagnosis not present

## 2021-09-13 DIAGNOSIS — Z923 Personal history of irradiation: Secondary | ICD-10-CM | POA: Diagnosis not present

## 2021-09-13 DIAGNOSIS — D123 Benign neoplasm of transverse colon: Secondary | ICD-10-CM | POA: Diagnosis not present

## 2021-09-13 DIAGNOSIS — Z794 Long term (current) use of insulin: Secondary | ICD-10-CM | POA: Diagnosis not present

## 2021-09-13 DIAGNOSIS — M1990 Unspecified osteoarthritis, unspecified site: Secondary | ICD-10-CM | POA: Diagnosis not present

## 2021-09-13 LAB — HM COLONOSCOPY

## 2021-09-18 ENCOUNTER — Other Ambulatory Visit: Payer: Self-pay

## 2021-10-01 ENCOUNTER — Ambulatory Visit (INDEPENDENT_AMBULATORY_CARE_PROVIDER_SITE_OTHER): Payer: Medicare Other | Admitting: Internal Medicine

## 2021-10-01 ENCOUNTER — Encounter: Payer: Self-pay | Admitting: Internal Medicine

## 2021-10-01 ENCOUNTER — Other Ambulatory Visit: Payer: Self-pay

## 2021-10-01 VITALS — BP 122/78 | HR 64 | Ht 62.0 in | Wt 152.4 lb

## 2021-10-01 DIAGNOSIS — I10 Essential (primary) hypertension: Secondary | ICD-10-CM

## 2021-10-01 DIAGNOSIS — E118 Type 2 diabetes mellitus with unspecified complications: Secondary | ICD-10-CM | POA: Diagnosis not present

## 2021-10-01 LAB — POCT GLYCOSYLATED HEMOGLOBIN (HGB A1C): Hemoglobin A1C: 9.1 % — AB (ref 4.0–5.6)

## 2021-10-01 MED ORDER — EMPAGLIFLOZIN 25 MG PO TABS: 25.0000 mg | ORAL_TABLET | Freq: Every day | ORAL | 3 refills | Status: DC

## 2021-10-01 NOTE — Progress Notes (Signed)
Date:  10/01/2021   Name:  [name]   DOB:  1946/02/18   MRN:  [id number]   Chief Complaint: Hypertension and Diabetes (POC4)  Hypertension This is a chronic problem. The problem is uncontrolled. Pertinent negatives include no chest pain, headaches, palpitations or shortness of breath. Past treatments include ACE inhibitors and beta blockers (lisinopril increased to 10 mg last visit).  Diabetes She presents for her follow-up diabetic visit. She has type 2 diabetes mellitus. Her disease course has been stable. Pertinent negatives for hypoglycemia include no headaches or tremors. Pertinent negatives for diabetes include no chest pain, no fatigue, no polydipsia and no polyuria. Current diabetic treatment includes insulin injections (lantus bid).   Lab Results  Component Value Date   CREATININE 0.95 04/23/2021   BUN 14 04/23/2021   NA 132 (L) 04/23/2021   K 4.3 04/23/2021   CL 100 04/23/2021   CO2 26 04/23/2021   Lab Results  Component Value Date   CHOL 204 (H) 12/25/2020   HDL 99 12/25/2020   LDLCALC 93 12/25/2020   TRIG 67 12/25/2020   CHOLHDL 2.1 12/25/2020   Lab Results  Component Value Date   TSH 1.710 12/25/2020   Lab Results  Component Value Date   HGBA1C 9.1 (A) 10/01/2021   Lab Results  Component Value Date   WBC 8.0 04/23/2021   HGB 11.6 (L) 04/23/2021   HCT 35.6 (L) 04/23/2021   MCV 90.1 04/23/2021   PLT 249 04/23/2021   Lab Results  Component Value Date   ALT 15 04/23/2021   AST 18 04/23/2021   ALKPHOS 98 04/23/2021   BILITOT 0.5 04/23/2021     Review of Systems  Constitutional:  Negative for appetite change, fatigue, fever and unexpected weight change.  HENT:  Negative for tinnitus and trouble swallowing.   Eyes:  Negative for visual disturbance.  Respiratory:  Negative for cough, chest tightness and shortness of breath.   Cardiovascular:  Negative for chest pain, palpitations and leg swelling.  Gastrointestinal:  Negative for  abdominal pain.  Endocrine: Negative for polydipsia and polyuria.  Genitourinary:  Negative for dysuria and hematuria.  Musculoskeletal:  Negative for arthralgias.  Neurological:  Negative for tremors, numbness and headaches.  Psychiatric/Behavioral:  Negative for dysphoric mood.    Patient Active Problem List   Diagnosis Date Noted   Mood disorder ([place]) 12/21/2019   Primary osteoarthritis of both hips 06/10/2019   Bilateral carotid artery stenosis 08/24/2018   Atherosclerosis of aorta ([place]) 08/24/2018   Type II diabetes mellitus with complication ([place]) 88/41/6606   Bradycardia 07/24/2018   Neoplasm of uncertain behavior of skin 01/16/2017   Senile ecchymosis 08/20/2015   Essential (primary) hypertension 05/08/2015   History of rectal cancer 05/08/2015   Hyperlipidemia associated with type 2 diabetes mellitus ([place]) 05/08/2015   Seizure disorder ([place]) 05/08/2015   Shoulder strain 05/08/2015   Avitaminosis D 05/08/2015    Allergies  Allergen Reactions   Baclofen Other (See Comments)    Hallucinations   Pioglitazone Nausea Only    Past Surgical History:  Procedure Laterality Date   CATARACT EXTRACTION, BILATERAL  01/2020   Dr. Lucianne Lei [facility]   COLOSTOMY REVERSAL  01/2014   ILEOSTOMY  01/159   UMBILICAL HERNIA REPAIR  05/2018    Social History   Tobacco Use   Smoking status: Never   Smokeless tobacco: Never  Vaping Use   Vaping Use: Never used  Substance Use Topics   Alcohol use: No  Alcohol/week: 0.0 standard drinks   Drug use: No     Medication list has been reviewed and updated.  Current Meds  Medication Sig   aspirin 81 MG chewable tablet Chew 81 mg by mouth daily.    atorvastatin (LIPITOR) 40 MG tablet TAKE 1 TABLET BY MOUTH DAILY   Bacillus Coagulans-Inulin (PROBIOTIC) 1-250 BILLION-MG CAPS Take 1 capsule by mouth daily.   calcium carbonate (OSCAL) 1500 (600 Ca) MG TABS tablet Take 600 mg of elemental calcium by mouth daily.   carbamazepine (TEGRETOL) 200  MG tablet TAKE 1 TABLET(200 MG) BY MOUTH DAILY   Cholecalciferol 25 MCG (1000 UT) capsule Take 1,000 Units by mouth daily.    docusate sodium (COLACE) 250 MG capsule Take 250 mg by mouth daily.   empagliflozin (JARDIANCE) 25 MG TABS tablet Take 1 tablet (25 mg total) by mouth daily before breakfast.   Fluocinolone Acetonide 0.01 % OIL Place 1 drop into both ears at bedtime. Alternating ears each night   HYDROcodone-acetaminophen (NORCO/VICODIN) 5-325 MG tablet Take 1-2 tablets by mouth every 6 (six) hours as needed for moderate pain.   insulin glargine (LANTUS SOLOSTAR) 100 UNIT/ML Solostar Pen Inject 10 Units into the skin every morning AND 8 Units every evening. (Patient taking differently: Inject 11 Units into the skin every morning AND 9 Units every evening.)   metoprolol succinate (TOPROL-XL) 25 MG 24 hr tablet TAKE 1/2 TABLET(12.5 MG) BY MOUTH IN THE MORNING AND AT BEDTIME   nitroGLYCERIN (NITROSTAT) 0.4 MG SL tablet Place 1 tablet (0.4 mg total) under the tongue every 5 (five) minutes x 3 doses as needed for chest pain.   ONETOUCH ULTRA test strip USE 1 STRIP TO CHECK GLUCOSE TWICE DAILY   pantoprazole (PROTONIX) 40 MG tablet Take 1 tablet (40 mg total) by mouth daily as needed (heartburn).   Propylene Glycol (SYSTANE BALANCE OP) Place 1 drop into both eyes 2 (two) times daily as needed (dry eyes).   ramipril (ALTACE) 10 MG capsule Take 1 capsule (10 mg total) by mouth daily.   trolamine salicylate (ASPERCREME) 10 % cream Apply 1 application topically as needed for muscle pain.   [DISCONTINUED] metoprolol succinate (TOPROL-XL) 25 MG 24 hr tablet TAKE 1/2 TABLET(12.5 MG) BY MOUTH IN THE MORNING AND AT BEDTIME    PHQ 2/9 Scores 10/01/2021 08/01/2021 04/29/2021 04/12/2021  PHQ - 2 Score 1 0 2 4  PHQ- 9 Score 6 5 5 11     GAD 7 : Generalized Anxiety Score 10/01/2021 08/01/2021 04/29/2021 04/12/2021  Nervous, Anxious, on Edge 1 1 0 2  Control/stop worrying 1 0 0 2  Worry too much - different  things 1 0 0 2  Trouble relaxing 1 1 0 3  Restless 0 0 0 3  Easily annoyed or irritable 0 0 0 2  Afraid - awful might happen 1 1 0 2  Total GAD 7 Score 5 3 0 16  Anxiety Difficulty Not difficult at all - - -    BP Readings from Last 3 Encounters:  10/01/21 122/78  08/01/21 (!) 158/82  06/03/21 (!) 148/73    Physical Exam Vitals and nursing note reviewed.  Constitutional:      General: She is not in acute distress.    Appearance: She is well-developed.  HENT:     Head: Normocephalic and atraumatic.  Cardiovascular:     Rate and Rhythm: Normal rate and regular rhythm.     Pulses: Normal pulses.     Heart sounds: No murmur heard.  Pulmonary:     Effort: Pulmonary effort is normal. No respiratory distress.     Breath sounds: No wheezing or rhonchi.  Musculoskeletal:     Cervical back: Normal range of motion.     Right lower leg: No edema.     Left lower leg: No edema.  Lymphadenopathy:     Cervical: No cervical adenopathy.  Skin:    General: Skin is warm and dry.     Capillary Refill: Capillary refill takes less than 2 seconds.     Findings: No rash.  Neurological:     General: No focal deficit present.     Mental Status: She is alert and oriented to person, place, and time.  Psychiatric:        Mood and Affect: Mood normal.        Behavior: Behavior normal.    Wt Readings from Last 3 Encounters:  10/01/21 152 lb 6.4 oz (69.1 kg)  08/01/21 151 lb (68.5 kg)  06/03/21 149 lb (67.6 kg)    BP 122/78   Pulse 64   Ht 5\' 2"  (1.575 m)   Wt 152 lb 6.4 oz (69.1 kg)   SpO2 99%   BMI 27.87 kg/m   Assessment and Plan: 1. Essential (primary) hypertension BP much improved with medication change. Continue metoprolol and ramipril  2. Type II diabetes mellitus with complication (HCC) BS has worsened.  She tries to take Lantus twice a day but often forgets the second dose.  Intolerant of metformin and Actos Will add Jardiance 25 mg daily (samples given) Follow up in 3  months. - POCT glycosylated hemoglobin (Hb A1C) = 9.1 up from 8.3 - empagliflozin (JARDIANCE) 25 MG TABS tablet; Take 1 tablet (25 mg total) by mouth daily before breakfast.  Dispense: 30 tablet; Refill: 3   Partially dictated using Editor, commissioning. Any errors are unintentional.  Halina Maidens, MD [facility]  10/01/2021

## 2021-10-01 NOTE — Patient Instructions (Signed)
Add Jardiance 25 mg once a day.

## 2021-10-04 ENCOUNTER — Other Ambulatory Visit: Payer: Self-pay | Admitting: Internal Medicine

## 2021-10-04 DIAGNOSIS — E118 Type 2 diabetes mellitus with unspecified complications: Secondary | ICD-10-CM

## 2021-10-04 NOTE — Telephone Encounter (Signed)
Requested Prescriptions  Pending Prescriptions Disp Refills  . LANTUS SOLOSTAR 100 UNIT/ML Solostar Pen [Pharmacy Med Name: LANTUS SOLOSTAR PEN INJ 3ML] 30 mL     Sig: INJECT 20 TO 25 UNITS UNDER THE SKIN DAILY.     Endocrinology:  Diabetes - Insulins Failed - 10/04/2021 11:01 AM      Failed - HBA1C is between 0 and 7.9 and within 180 days    Hemoglobin A1C  Date Value Ref Range Status  10/01/2021 9.1 (A) 4.0 - 5.6 % Final   Hgb A1c MFr Bld  Date Value Ref Range Status  12/25/2020 8.7 (H) 4.8 - 5.6 % Final    Comment:             Prediabetes: 5.7 - 6.4          Diabetes: >6.4          Glycemic control for adults with diabetes: <7.0          Passed - Valid encounter within last 6 months    Recent Outpatient Visits          3 days ago Essential (primary) hypertension   [facility] Glean Hess, MD   2 months ago Type II diabetes mellitus with complication [facility])   [facility] Gle[facility], [facility]   5 months ago Type II diabetes mellitus with complication [facility])   [facility] Glean Hess, [facility]   5 months ago Type II diabetes mellitus with complication [facility])   [facility] Glean Hess, [facility]   9 months ago Annual physical exam   [facility] Glean Hess, MD      Future Appointments            In 2 months Army Melia Jesse Sans, MD [facility], [place]   In 2 months Army Melia, Jesse Sans, MD [facility], [facility]

## 2021-10-22 DIAGNOSIS — H6123 Impacted cerumen, bilateral: Secondary | ICD-10-CM | POA: Diagnosis not present

## 2021-10-22 DIAGNOSIS — J383 Other diseases of vocal cords: Secondary | ICD-10-CM | POA: Diagnosis not present

## 2021-10-22 DIAGNOSIS — H6062 Unspecified chronic otitis externa, left ear: Secondary | ICD-10-CM | POA: Diagnosis not present

## 2021-10-28 DIAGNOSIS — H524 Presbyopia: Secondary | ICD-10-CM | POA: Diagnosis not present

## 2021-10-28 DIAGNOSIS — E133293 Other specified diabetes mellitus with mild nonproliferative diabetic retinopathy without macular edema, bilateral: Secondary | ICD-10-CM | POA: Diagnosis not present

## 2021-10-28 DIAGNOSIS — Z961 Presence of intraocular lens: Secondary | ICD-10-CM | POA: Diagnosis not present

## 2021-10-28 DIAGNOSIS — H5203 Hypermetropia, bilateral: Secondary | ICD-10-CM | POA: Diagnosis not present

## 2021-10-28 LAB — HM DIABETES EYE EXAM

## 2021-10-29 ENCOUNTER — Encounter: Payer: Self-pay | Admitting: Internal Medicine

## 2021-10-30 ENCOUNTER — Other Ambulatory Visit: Payer: Self-pay | Admitting: Internal Medicine

## 2021-10-30 DIAGNOSIS — G40909 Epilepsy, unspecified, not intractable, without status epilepticus: Secondary | ICD-10-CM

## 2021-10-30 NOTE — Telephone Encounter (Signed)
Requested medication (s) are due for refill today - yes  Requested medication (s) are on the active medication list -yes  Future visit scheduled -yes  Last refill: 07/26/21 #90  Notes to clinic: Request RF: non delegated Rx, fails lab protocol  Requested Prescriptions  Pending Prescriptions Disp Refills   carbamazepine (TEGRETOL) 200 MG tablet [Pharmacy Med Name: CARBAMAZEPINE 200MG  TABLETS] 90 tablet 0    Sig: TAKE 1 TABLET(200 MG) BY MOUTH DAILY     Not Delegated - Neurology:  Anticonvulsants - carbamazepine Failed - 10/30/2021 10:06 AM      Failed - This refill cannot be delegated      Failed - AST in normal range and within 90 days    AST  Date Value Ref Range Status  04/23/2021 18 15 - 41 U/L Final   SGOT(AST)  Date Value Ref Range Status  05/04/2013 11 (L) 15 - 37 Unit/L Final          Failed - ALT in normal range and within 90 days    ALT  Date Value Ref Range Status  04/23/2021 15 0 - 44 U/L Final   SGPT (ALT)  Date Value Ref Range Status  05/04/2013 19 12 - 78 U/L Final          Failed - Carbamazepine (serum) in normal range and within 360 days    Carbamazepine (Tegretol), S  Date Value Ref Range Status  12/21/2019 6.0 4.0 - 12.0 ug/mL Final    Comment:             In conjunction with other antiepileptic drugs                                Therapeutic  4.0 -  8.0                                Toxicity     9.0 - 12.0                                    Carbamazepine alone                                Therapeutic  8.0 - 12.0                                 Detection Limit =  2.0                           <2.0 indicated None Detected           Failed - WBC in normal range and within 90 days    WBC  Date Value Ref Range Status  04/23/2021 8.0 4.0 - 10.5 K/uL Final          Failed - PLT in normal range and within 90 days    Platelets  Date Value Ref Range Status  04/23/2021 249 150 - 400 K/uL Final  12/25/2020 325 150 - 450 x10E3/uL Final           Failed - HGB in normal range and within 90 days    Hemoglobin  Date  Value Ref Range Status  04/23/2021 11.6 (L) 12.0 - 15.0 g/dL Final  12/25/2020 12.0 11.1 - 15.9 g/dL Final          Failed - Na in normal range and within 90 days    Sodium  Date Value Ref Range Status  04/23/2021 132 (L) 135 - 145 mmol/L Final  12/25/2020 137 134 - 144 mmol/L Final  04/13/2013 136 136 - 145 mmol/L Final          Failed - HCT in normal range and within 90 days    HCT  Date Value Ref Range Status  04/23/2021 35.6 (L) 36.0 - 46.0 % Final   Hematocrit  Date Value Ref Range Status  12/25/2020 36.1 34.0 - 46.6 % Final          Passed - Valid encounter within last 12 months    Recent Outpatient Visits           4 weeks ago Essential (primary) hypertension   [facility], [facility]   3 months ago Type II diabetes mellitus with complication [facility])   [facility] Glean Hess, [facility]   6 months ago Type II diabetes mellitus with complication [facility])   [facility] Glean Hess, [facility]   6 months ago Type II diabetes mellitus with complication [facility])   [facility] Glean Hess, [facility]   10 months ago Annual physical exam   [facility] Glean Hess, [facility]       Future Appointments             In 1 month Army Melia Jesse Sans, MD [facility], [place]   In 2 months Gle[facility] Hess, [facility], [facility]               Requested Prescriptions  Pending Prescriptions Disp Refills   carbamazepine (TEGRETOL) 200 MG tablet [Pharmacy Med Name: CARBAMAZEPINE 200MG  TABLETS] 90 tablet 0    Sig: TAKE 1 TABLET(200 MG) BY MOUTH DAILY     Not Delegated - Neurology:  Anticonvulsants - carbamazepine Failed - 10/30/2021 10:06 AM      Failed - This refill cannot be delegated      Failed - AST in normal range and within 90 days    AST  Date Value Ref Range Status  04/23/2021 18 15 - 41 U/L Final   SGOT(AST)  Date  Value Ref Range Status  05/04/2013 11 (L) 15 - 37 Unit/L Final          Failed - ALT in normal range and within 90 days    ALT  Date Value Ref Range Status  04/23/2021 15 0 - 44 U/L Final   SGPT (ALT)  Date Value Ref Range Status  05/04/2013 19 12 - 78 U/L Final          Failed - Carbamazepine (serum) in normal range and within 360 days    Carbamazepine (Tegretol), S  Date Value Ref Range Status  12/21/2019 6.0 4.0 - 12.0 ug/mL Final    Comment:             In conjunction with other antiepileptic drugs                                Therapeutic  4.0 -  8.0  Toxicity     9.0 - 12.0                                    Carbamazepine alone                                Therapeutic  8.0 - 12.0                                 Detection Limit =  2.0                           <2.0 indicated None Detected           Failed - WBC in normal range and within 90 days    WBC  Date Value Ref Range Status  04/23/2021 8.0 4.0 - 10.5 K/uL Final          Failed - PLT in normal range and within 90 days    Platelets  Date Value Ref Range Status  04/23/2021 249 150 - 400 K/uL Final  12/25/2020 325 150 - 450 x10E3/uL Final          Failed - HGB in normal range and within 90 days    Hemoglobin  Date Value Ref Range Status  04/23/2021 11.6 (L) 12.0 - 15.0 g/dL Final  12/25/2020 12.0 11.1 - 15.9 g/dL Final          Failed - Na in normal range and within 90 days    Sodium  Date Value Ref Range Status  04/23/2021 132 (L) 135 - 145 mmol/L Final  12/25/2020 137 134 - 144 mmol/L Final  04/13/2013 136 136 - 145 mmol/L Final          Failed - HCT in normal range and within 90 days    HCT  Date Value Ref Range Status  04/23/2021 35.6 (L) 36.0 - 46.0 % Final   Hematocrit  Date Value Ref Range Status  12/25/2020 36.1 34.0 - 46.6 % Final          Passed - Valid encounter within last 12 months    Recent Outpatient Visits           4 weeks ago  Essential (primary) hypertension   [facility] Glean Hess, MD   3 months ago Type II diabetes mellitus with complication [facility])   [facility] Glean Hess, MD   6 months ago Type II diabetes mellitus with complication [facility])   [facility] Glean Hess, MD   6 months ago Type II diabetes mellitus with complication [facility])   [facility] Glean Hess, MD   10 months ago Annual physical exam   [facility] Glean Hess, MD       Future Appointments             In 1 month Army Melia Jesse Sans, MD [facility], [place]   In 2 months Army Melia, Jesse Sans, MD [facility], [facility]

## 2021-12-10 ENCOUNTER — Other Ambulatory Visit: Payer: Self-pay | Admitting: Internal Medicine

## 2021-12-10 DIAGNOSIS — E785 Hyperlipidemia, unspecified: Secondary | ICD-10-CM

## 2021-12-10 NOTE — Telephone Encounter (Signed)
Requested Prescriptions  Pending Prescriptions Disp Refills   atorvastatin (LIPITOR) 40 MG tablet [Pharmacy Med Name: ATORVASTATIN 40MG  TABLETS] 90 tablet 0    Sig: TAKE 1 TABLET BY MOUTH DAILY     Cardiovascular:  Antilipid - Statins Failed - 12/10/2021  9:44 AM      Failed - Total Cholesterol in normal range and within 360 days    Cholesterol, Total  Date Value Ref Range Status  12/25/2020 204 (H) 100 - 199 mg/dL Final         Passed - LDL in normal range and within 360 days    LDL Chol Calc (NIH)  Date Value Ref Range Status  12/25/2020 93 0 - 99 mg/dL Final         Passed - HDL in normal range and within 360 days    HDL  Date Value Ref Range Status  12/25/2020 99 >39 mg/dL Final         Passed - Triglycerides in normal range and within 360 days    Triglycerides  Date Value Ref Range Status  12/25/2020 67 0 - 149 mg/dL Final         Passed - Patient is not pregnant      Passed - Valid encounter within last 12 months    Recent Outpatient Visits          2 months ago Essential (primary) hypertension   [facility] Glean Hess, MD   4 months ago Type II diabetes mellitus with complication [facility])   [facility]   7 months ago Type II diabetes mellitus with complication [facility])   [facility] Glean Hess, MD   8 months ago Type II diabetes mellitus with complication [facility])   [facility] Glean Hess, MD   11 months ago Annual physical exam   [facility] Glean Hess, MD      Future Appointments            In 2 weeks Army Melia Jesse Sans, MD [facility], [place]   In 3 weeks Army Melia Jesse Sans, MD [facility], [facility]

## 2021-12-23 DIAGNOSIS — L851 Acquired keratosis [keratoderma] palmaris et plantaris: Secondary | ICD-10-CM | POA: Diagnosis not present

## 2021-12-23 DIAGNOSIS — E119 Type 2 diabetes mellitus without complications: Secondary | ICD-10-CM | POA: Diagnosis not present

## 2021-12-23 DIAGNOSIS — B351 Tinea unguium: Secondary | ICD-10-CM | POA: Diagnosis not present

## 2021-12-25 ENCOUNTER — Ambulatory Visit (INDEPENDENT_AMBULATORY_CARE_PROVIDER_SITE_OTHER): Payer: Medicare Other

## 2021-12-25 DIAGNOSIS — Z0000 Encounter for general adult medical examination without abnormal findings: Secondary | ICD-10-CM | POA: Diagnosis not present

## 2021-12-25 NOTE — Patient Instructions (Signed)
[name] , Thank you for taking time to come for your Medicare Wellness Visit. I appreciate your ongoing commitment to your health goals. Please review the following plan we discussed and let me know if I can assist you in the future.   Screening recommendations/referrals: Colonoscopy: done 09/13/21. Repeat 02/2022 Mammogram: done 12/18/20. due Bone Density: done 2015; please discuss with Dr. Army Melia Recommended yearly ophthalmology/optometry visit for glaucoma screening and checkup Recommended yearly dental visit for hygiene and checkup  Vaccinations: Influenza vaccine: done 08/01/21 Pneumococcal vaccine: done 07/17/17 Tdap vaccine: done 12/19/14 Shingles vaccine: Shingrix discussed. Please contact your pharmacy for coverage information.  Covid-19: done 01/10/20 & 02/10/20  Advanced directives: Advance directive discussed with you today. Even though you declined this today please call our office should you change your mind and we can give you the proper paperwork for you to fill out.   Conditions/risks identified: Keep up the great work!  Next appointment: Follow up in one year for your annual wellness visit    Preventive Care 65 Years and Older, Female Preventive care refers to lifestyle choices and visits with your health care provider that can promote health and wellness. What does preventive care include? A yearly physical exam. This is also called an annual well check. Dental exams once or twice a year. Routine eye exams. Ask your health care provider how often you should have your eyes checked. Personal lifestyle choices, including: Daily care of your teeth and gums. Regular physical activity. Eating a healthy diet. Avoiding tobacco and drug use. Limiting alcohol use. Practicing safe sex. Taking low-dose aspirin every day. Taking vitamin and mineral supplements as recommended by your health care provider. What happens during an annual well check? The services and screenings  done by your health care provider during your annual well check will depend on your age, overall health, lifestyle risk factors, and family history of disease. Counseling  Your health care provider may ask you questions about your: Alcohol use. Tobacco use. Drug use. Emotional well-being. Home and relationship well-being. Sexual activity. Eating habits. History of falls. Memory and ability to understand (cognition). Work and work Statistician. Reproductive health. Screening  You may have the following tests or measurements: Height, weight, and BMI. Blood pressure. Lipid and cholesterol levels. These may be checked every 5 years, or more frequently if you are over 61 years old. Skin check. Lung cancer screening. You may have this screening every year starting at age 10 if you have a 30-pack-year history of smoking and currently smoke or have quit within the past 15 years. Fecal occult blood test (FOBT) of the stool. You may have this test every year starting at age 75. Flexible sigmoidoscopy or colonoscopy. You may have a sigmoidoscopy every 5 years or a colonoscopy every 10 years starting at age 24. Hepatitis C blood test. Hepatitis B blood test. Sexually transmitted disease (STD) testing. Diabetes screening. This is done by checking your blood sugar (glucose) after you have not eaten for a while (fasting). You may have this done every 1-3 years. Bone density scan. This is done to screen for osteoporosis. You may have this done starting at age 61. Mammogram. This may be done every 1-2 years. Talk to your health care provider about how often you should have regular mammograms. Talk with your health care provider about your test results, treatment options, and if necessary, the need for more tests. Vaccines  Your health care provider may recommend certain vaccines, such as: Influenza vaccine. This is recommended  every year. Tetanus, diphtheria, and acellular pertussis (Tdap, Td)  vaccine. You may need a Td booster every 10 years. Zoster vaccine. You may need this after age 21. Pneumococcal 13-valent conjugate (PCV13) vaccine. One dose is recommended after age 63. Pneumococcal polysaccharide (PPSV23) vaccine. One dose is recommended after age 85. Talk to your health care provider about which screenings and vaccines you need and how often you need them. This information is not intended to replace advice given to you by your health care provider. Make sure you discuss any questions you have with your health care provider. Document Released: 11/30/2015 Document Revised: 07/23/2016 Document Reviewed: 09/04/2015 Elsevier Interactive Patient Education  2017 [employer] Prevention in the Home Falls can cause injuries. They can happen to people of all ages. There are many things you can do to make your home safe and to help prevent falls. What can I do on the outside of my home? Regularly fix the edges of walkways and driveways and fix any cracks. Remove anything that might make you trip as you walk through a door, such as a raised step or threshold. Trim any bushes or trees on the path to your home. Use bright outdoor lighting. Clear any walking paths of anything that might make someone trip, such as rocks or tools. Regularly check to see if handrails are loose or broken. Make sure that both sides of any steps have handrails. Any raised decks and porches should have guardrails on the edges. Have any leaves, snow, or ice cleared regularly. Use sand or salt on walking paths during winter. Clean up any spills in your garage right away. This includes oil or grease spills. What can I do in the bathroom? Use night lights. Install grab bars by the toilet and in the tub and shower. Do not use towel bars as grab bars. Use non-skid mats or decals in the tub or shower. If you need to sit down in the shower, use a plastic, non-slip stool. Keep the floor dry. Clean up any  water that spills on the floor as soon as it happens. Remove soap buildup in the tub or shower regularly. Attach bath mats securely with double-sided non-slip rug tape. Do not have throw rugs and other things on the floor that can make you trip. What can I do in the bedroom? Use night lights. Make sure that you have a light by your bed that is easy to reach. Do not use any sheets or blankets that are too big for your bed. They should not hang down onto the floor. Have a firm chair that has side arms. You can use this for support while you get dressed. Do not have throw rugs and other things on the floor that can make you trip. What can I do in the kitchen? Clean up any spills right away. Avoid walking on wet floors. Keep items that you use a lot in easy-to-reach places. If you need to reach something above you, use a strong step stool that has a grab bar. Keep electrical cords out of the way. Do not use floor polish or wax that makes floors slippery. If you must use wax, use non-skid floor wax. Do not have throw rugs and other things on the floor that can make you trip. What can I do with my stairs? Do not leave any items on the stairs. Make sure that there are handrails on both sides of the stairs and use them. Fix handrails that are broken  or loose. Make sure that handrails are as long as the stairways. Check any carpeting to make sure that it is firmly attached to the stairs. Fix any carpet that is loose or worn. Avoid having throw rugs at the top or bottom of the stairs. If you do have throw rugs, attach them to the floor with carpet tape. Make sure that you have a light switch at the top of the stairs and the bottom of the stairs. If you do not have them, ask someone to add them for you. What else can I do to help prevent falls? Wear shoes that: Do not have high heels. Have rubber bottoms. Are comfortable and fit you well. Are closed at the toe. Do not wear sandals. If you use a  stepladder: Make sure that it is fully opened. Do not climb a closed stepladder. Make sure that both sides of the stepladder are locked into place. Ask someone to hold it for you, if possible. Clearly mark and make sure that you can see: Any grab bars or handrails. First and last steps. Where the edge of each step is. Use tools that help you move around (mobility aids) if they are needed. These include: Canes. Walkers. Scooters. Crutches. Turn on the lights when you go into a dark area. Replace any light bulbs as soon as they burn out. Set up your furniture so you have a clear path. Avoid moving your furniture around. If any of your floors are uneven, fix them. If there are any pets around you, be aware of where they are. Review your medicines with your doctor. Some medicines can make you feel dizzy. This can increase your chance of falling. Ask your doctor what other things that you can do to help prevent falls. This information is not intended to replace advice given to you by your health care provider. Make sure you discuss any questions you have with your health care provider. Document Released: 08/30/2009 Document Revised: 04/10/2016 Document Reviewed: 12/08/2014 Elsevier Interactive Patient Education  2017 [employer].

## 2021-12-25 NOTE — Progress Notes (Signed)
Subjective:   [name] is a 76 y.o. female who presents for Medicare Annual (Subsequent) preventive examination.  Virtual Visit via Telephone Note  I connected with  Barron Schmid on 12/25/21 at  3:20 PM EST by telephone and verified that I am speaking with the correct person using two identifiers.  Location: Patient: home Provider: [facility] Persons participating in the virtual visit: [place]   I discussed the limitations, risks, security and privacy concerns of performing an evaluation and management service by telephone and the availability of in person appointments. The patient expressed understanding and agreed to proceed.  Interactive audio and video telecommunications were attempted between this nurse and patient, however failed, due to patient having technical difficulties OR patient did not have access to video capability.  We continued and completed visit with audio only.  Some vital signs may be absent or patient reported.   Clemetine Marker, LPN   Review of Systems     Cardiac Risk Factors include: advanced age (>3men, >50 women);diabetes mellitus;dyslipidemia;hypertension     Objective:    There were no vitals filed for this visit. There is no height or weight on file to calculate BMI.  Advanced Directives 12/25/2021 05/24/2021 03/31/2021 03/24/2021 12/24/2020 12/05/2019 12/01/2018  Does Patient Have a Medical Advance Directive? No No No No No No Yes  Does patient want to make changes to medical advance directive? - - - - - - (No Data)  Would patient like information on creating a medical advance directive? No - Patient declined No - Patient declined - No - Patient declined No - Patient declined No - Patient declined -    Current Medications (verified) Outpatient Encounter Medications as of 12/25/2021  Medication Sig   aspirin 81 MG chewable tablet Chew 81 mg by mouth daily.    atorvastatin (LIPITOR) 40 MG tablet TAKE 1 TABLET BY MOUTH DAILY    Bacillus Coagulans-Inulin (PROBIOTIC) 1-250 BILLION-MG CAPS Take 1 capsule by mouth daily.   calcium carbonate (OSCAL) 1500 (600 Ca) MG TABS tablet Take 600 mg of elemental calcium by mouth daily.   carbamazepine (TEGRETOL) 200 MG tablet TAKE 1 TABLET(200 MG) BY MOUTH DAILY   Cholecalciferol 25 MCG (1000 UT) capsule Take 1,000 Units by mouth daily.    docusate sodium (COLACE) 250 MG capsule Take 250 mg by mouth daily.   empagliflozin (JARDIANCE) 25 MG TABS tablet Take 1 tablet (25 mg total) by mouth daily before breakfast.   Fluocinolone Acetonide 0.01 % OIL Place 1 drop into both ears at bedtime. Alternating ears each night   HYDROcodone-acetaminophen (NORCO/VICODIN) 5-325 MG tablet Take 1-2 tablets by mouth every 6 (six) hours as needed for moderate pain.   LANTUS SOLOSTAR 100 UNIT/ML Solostar Pen INJECT 20 TO 25 UNITS UNDER THE SKIN DAILY.   metoprolol succinate (TOPROL-XL) 25 MG 24 hr tablet TAKE 1/2 TABLET(12.5 MG) BY MOUTH IN THE MORNING AND AT BEDTIME   nitroGLYCERIN (NITROSTAT) 0.4 MG SL tablet Place 1 tablet (0.4 mg total) under the tongue every 5 (five) minutes x 3 doses as needed for chest pain.   ONETOUCH ULTRA test strip USE 1 STRIP TO CHECK GLUCOSE TWICE DAILY   pantoprazole (PROTONIX) 40 MG tablet Take 1 tablet (40 mg total) by mouth daily as needed (heartburn).   Propylene Glycol (SYSTANE BALANCE OP) Place 1 drop into both eyes 2 (two) times daily as needed (dry eyes).   ramipril (ALTACE) 10 MG capsule Take 1 capsule (10 mg total) by mouth daily.  trolamine salicylate (ASPERCREME) 10 % cream Apply 1 application topically as needed for muscle pain.   No facility-administered encounter medications on file as of 12/25/2021.    Allergies (verified) Baclofen and Pioglitazone   History: Past Medical History:  Diagnosis Date   Allergy    Arthritis of knee, degenerative 05/08/2015   Basal cell carcinoma 03/2019   nose   Colon cancer ([place]) 2014   Partial colon resection, chemo +  rad tx's.    Complication of anesthesia    Diabetes mellitus without complication ([place])    Pt takes Metformin.   Hyperlipidemia    Hypertension    PONV (postoperative nausea and vomiting)    SBO (small bowel obstruction) ([place]) 04/20/2018   Seizures ([place])    Umbilical hernia with obstruction but no gangrene    Vitamin D deficiency    Past Surgical History:  Procedure Laterality Date   CATARACT EXTRACTION, BILATERAL  01/2020   Dr. Lucianne Lei [facility]   COLOSTOMY REVERSAL  01/2014   ILEOSTOMY  12/5954   UMBILICAL HERNIA REPAIR  05/2018   Family History  Problem Relation Age of Onset   Cancer Mother 32       unknown type   Diabetes Daughter    Diabetes Son    Social History   Socioeconomic History   Marital status: Widowed    Spouse name: Not on file   Number of children: 3   Years of education: Not on file   Highest education level: Some college, no degree  Occupational History   Not on file  Tobacco Use   Smoking status: Never   Smokeless tobacco: Never  Vaping Use   Vaping Use: Never used  Substance and Sexual Activity   Alcohol use: No    Alcohol/week: 0.0 standard drinks   Drug use: No   Sexual activity: Not Currently  Other Topics Concern   Not on file  Social History Narrative   Pt's daughter lives with her   Social Determinants of Health   Financial Resource Strain: Low Risk    Difficulty of Paying Living Expenses: Not very hard  Food Insecurity: No Food Insecurity   Worried About Charity fundraiser in the Last Year: Never true   [place]anda in the Last Year: Never true  Transportation Needs: No Transportation Needs   Lack of Transportation (Medical): No   Lack of Transportation (Non-Medical): No  Physical Activity: Inactive   Days of Exercise per Week: 0 days   Minutes of Exercise per Session: 0 min  Stress: No Stress Concern Present   Feeling of Stress : Only a little  Social Connections: Moderately Isolated   Frequency of Communication with Friends  and Family: More than three times a week   Frequency of Social Gatherings with Friends and Family: More than three times a week   Attends Religious Services: More than 4 times per year   Active Member of [employer] or Organizations: No   Attends Archivist Meetings: Never   Marital Status: Widowed    Tobacco Counseling Counseling given: Not Answered   Clinical Intake:  Pre-visit preparation completed: Yes  Pain : No/denies pain     Nutritional Risks: None Diabetes: Yes CBG done?: No Did pt. bring in CBG monitor from home?: No  How often do you need to have someone help you when you read instructions, pamphlets, or other written materials from your doctor or pharmacy?: 1 - Never  Nutrition Risk Assessment:  Has the  patient had any N/V/D within the last 2 months?  Yes Does the patient have any non-healing wounds?  No  Has the patient had any unintentional weight loss or weight gain?  No   Diabetes:  Is the patient diabetic?  Yes  If diabetic, was a CBG obtained today?  No  Did the patient bring in their glucometer from home?  No  How often do you monitor your CBG's? daily.   Financial Strains and Diabetes Management:  Are you having any financial strains with the device, your supplies or your medication? No .  Does the patient want to be seen by Chronic Care Management for management of their diabetes?  No  Would the patient like to be referred to a Nutritionist or for Diabetic Management?  No   Diabetic Exams:  Diabetic Eye Exam: Completed 10/28/21.   Diabetic Foot Exam: Completed 12/25/20. Pt has been advised about the importance in completing this exam. Pt is scheduled for diabetic foot exam on 12/27/21.    Interpreter Needed?: No  Information entered by :: Clemetine Marker LPN   Activities of Daily Living In your present state of health, do you have any difficulty performing the following activities: 12/25/2021 10/01/2021  Hearing? Tempie Donning  Vision? N N   Difficulty concentrating or making decisions? Tempie Donning  Walking or climbing stairs? Y Y  Dressing or bathing? N N  Doing errands, shopping? N N  Preparing Food and eating ? N -  Using the Toilet? N -  In the past six months, have you accidently leaked urine? Y -  Do you have problems with loss of bowel control? N -  Managing your Medications? N -  Managing your Finances? N -  Housekeeping or managing your Housekeeping? N -  Some recent data might be hidden    Patient Care Team: Glean Hess, MD as PCP - General (Internal Medicine) Isaias Cowman, MD as Consulting Physician (Cardiology) Carloyn Manner, MD as Referring Physician (Otolaryngology) Baker Pierini, OD (Optometry) Ree Edman, MD (Dermatology) Gwinda Maine, MD as Referring Physician (Surgical Oncology) Baker Pierini, OD (Optometry)  Indicate any recent Medical Services you may have received from other than [facility] providers in the past year (date may be approximate).     Assessment:   This is a routine wellness examination for [place].  Hearing/Vision screen Hearing Screening - Comments:: Pt c/o mild hearing loss and has wax build up; declines hearing aids  Vision Screening - Comments:: Annual vision screenings with Dr. Mallie Mussel     Dietary issues and exercise activities discussed: Current Exercise Habits: The patient does not participate in regular exercise at present, Exercise limited by: None identified   Goals Addressed   None    Depression Screen PHQ 2/9 Scores 12/25/2021 10/01/2021 08/01/2021 04/29/2021 04/12/2021 12/25/2020 12/24/2020  PHQ - 2 Score 1 1 0 2 4 4 2   PHQ- 9 Score 3 6 5 5 11 6 5     Fall Risk Fall Risk  12/25/2021 10/01/2021 08/01/2021 05/07/2021 04/29/2021  Falls in the past year? 0 1 0 0 0  Comment - - - - -  Number falls in past yr: 0 0 0 - -  Injury with Fall? 0 0 0 - -  Risk for fall due to : No Fall Risks History of fall(s) No Fall Risks - -  Follow up Falls prevention  discussed Falls evaluation completed Falls evaluation completed - Falls evaluation completed    FALL RISK PREVENTION PERTAINING TO THE  HOME:  Any stairs in or around the home? Yes  If so, are there any without handrails? No  Home free of loose throw rugs in walkways, pet beds, electrical cords, etc? Yes  Adequate lighting in your home to reduce risk of falls? Yes   ASSISTIVE DEVICES UTILIZED TO PREVENT FALLS:  Life alert? No  Use of a cane, walker or w/c? No  Grab bars in the bathroom? Yes  Shower chair or bench in shower? Yes  Elevated toilet seat or a handicapped toilet? Yes   TIMED UP AND GO:  Was the test performed? No .   Cognitive Function: Normal cognitive status assessed by direct observation by this Nurse Health Advisor. No abnormalities found.       6CIT Screen 12/24/2020 12/05/2019 12/01/2018 09/21/2017  What Year? 0 points 0 points 0 points 0 points  What month? 0 points 0 points 0 points 0 points  What time? 0 points 0 points 0 points 0 points  Count back from 20 0 points 0 points 0 points 0 points  Months in reverse 0 points 0 points 0 points 0 points  Repeat phrase 0 points 0 points 0 points 2 points  Total Score 0 0 0 2    Immunizations Immunization History  Administered Date(s) Administered   Fluad Quad(high Dose 65+) 08/29/2019, 08/23/2020, 08/01/2021   Influenza, High Dose Seasonal PF 09/21/2017, 09/02/2018   Influenza,inj,Quad PF,6+ Mos 08/20/2015, 09/17/2016   PFIZER(Purple Top)SARS-COV-2 Vaccination 01/10/2020, 02/10/2020   Pneumococcal Conjugate-13 10/22/2015   Pneumococcal Polysaccharide-23 07/17/2017   Tdap 12/19/2014    TDAP status: Up to date  Flu Vaccine status: Up to date  Pneumococcal vaccine status: Up to date  Covid-19 vaccine status: Completed vaccines  Qualifies for Shingles Vaccine? Yes   Zostavax completed No   Shingrix Completed?: No.    Education has been provided regarding the importance of this vaccine. Patient has been  advised to call insurance company to determine out of pocket expense if they have not yet received this vaccine. Advised may also receive vaccine at local pharmacy or Health Dept. Verbalized acceptance and understanding.  Screening Tests Health Maintenance  Topic Date Due   Zoster Vaccines- Shingrix (1 of 2) Never done   COVID-19 Vaccine (3 - Pfizer risk series) 03/09/2020   MAMMOGRAM  12/18/2021   FOOT EXAM  12/25/2021   HEMOGLOBIN A1C  03/31/2022   COLONOSCOPY (Pts 45-53yrs Insurance coverage will need to be confirmed)  09/13/2022   OPHTHALMOLOGY EXAM  10/28/2022   TETANUS/TDAP  12/19/2024   Pneumonia Vaccine 31+ Years old  Completed   INFLUENZA VACCINE  Completed   Hepatitis C Screening  Completed   DEXA SCAN  Addressed   HPV VACCINES  Aged Out    Health Maintenance  Health Maintenance Due  Topic Date Due   Zoster Vaccines- Shingrix (1 of 2) Never done   COVID-19 Vaccine (3 - Pfizer risk series) 03/09/2020   MAMMOGRAM  12/18/2021   FOOT EXAM  12/25/2021    Colorectal cancer screening: Type of screening: Colonoscopy. Completed 09/13/21. Repeat every 6 months years  Mammogram status: Completed 12/18/20. Repeat every year. Pt to schedule with [facility].   Bone Density status: Completed 2015. Results reflect: Bone density results: NORMAL. Repeat every 2 years. Pt to discuss with PCP at next visit on 12/27/21  Lung Cancer Screening: (Low Dose CT Chest recommended if Age 79-80 years, 30 pack-year currently smoking OR have quit w/in 15years.) does not qualify.  Additional Screening:  Hepatitis C  Screening: does qualify; Completed 12/02/17  Vision Screening: Recommended annual ophthalmology exams for early detection of glaucoma and other disorders of the eye. Is the patient up to date with their annual eye exam?  Yes  Who is the provider or what is the name of the office in which the patient attends annual eye exams? Dr. Mallie Mussel  Dental Screening: Recommended annual dental exams for  proper oral hygiene  Community Resource Referral / Chronic Care Management: CRR required this visit?  No   CCM required this visit?  No      Plan:     I have personally reviewed and noted the following in the patients chart:   Medical and social history Use of alcohol, tobacco or illicit drugs  Current medications and supplements including opioid prescriptions.  Functional ability and status Nutritional status Physical activity Advanced directives List of other physicians Hospitalizations, surgeries, and ER visits in previous 12 months Vitals Screenings to include cognitive, depression, and falls Referrals and appointments  In addition, I have reviewed and discussed with patient certain preventive protocols, quality metrics, and best practice recommendations. A written personalized care plan for preventive services as well as general preventive health recommendations were provided to patient.     Clemetine Marker, LPN   11/17/313   Nurse Notes: none

## 2021-12-27 ENCOUNTER — Ambulatory Visit (INDEPENDENT_AMBULATORY_CARE_PROVIDER_SITE_OTHER): Payer: Medicare Other | Admitting: Internal Medicine

## 2021-12-27 ENCOUNTER — Encounter: Payer: Self-pay | Admitting: Internal Medicine

## 2021-12-27 ENCOUNTER — Other Ambulatory Visit: Payer: Self-pay

## 2021-12-27 VITALS — BP 128/58 | HR 59 | Ht 62.0 in | Wt 153.0 lb

## 2021-12-27 DIAGNOSIS — E118 Type 2 diabetes mellitus with unspecified complications: Secondary | ICD-10-CM

## 2021-12-27 DIAGNOSIS — E1169 Type 2 diabetes mellitus with other specified complication: Secondary | ICD-10-CM | POA: Diagnosis not present

## 2021-12-27 DIAGNOSIS — E785 Hyperlipidemia, unspecified: Secondary | ICD-10-CM | POA: Diagnosis not present

## 2021-12-27 DIAGNOSIS — Z1231 Encounter for screening mammogram for malignant neoplasm of breast: Secondary | ICD-10-CM | POA: Diagnosis not present

## 2021-12-27 DIAGNOSIS — M65342 Trigger finger, left ring finger: Secondary | ICD-10-CM

## 2021-12-27 DIAGNOSIS — Z0000 Encounter for general adult medical examination without abnormal findings: Secondary | ICD-10-CM

## 2021-12-27 DIAGNOSIS — I10 Essential (primary) hypertension: Secondary | ICD-10-CM | POA: Diagnosis not present

## 2021-12-27 DIAGNOSIS — I700 Atherosclerosis of aorta: Secondary | ICD-10-CM

## 2021-12-27 DIAGNOSIS — G40909 Epilepsy, unspecified, not intractable, without status epilepticus: Secondary | ICD-10-CM

## 2021-12-27 LAB — POCT URINALYSIS DIPSTICK
Bilirubin, UA: NEGATIVE
Blood, UA: NEGATIVE
Glucose, UA: POSITIVE — AB
Ketones, UA: NEGATIVE
Leukocytes, UA: NEGATIVE
Nitrite, UA: NEGATIVE
Protein, UA: NEGATIVE
Spec Grav, UA: 1.025 (ref 1.010–1.025)
Urobilinogen, UA: 0.2 E.U./dL
pH, UA: 6 (ref 5.0–8.0)

## 2021-12-27 NOTE — Progress Notes (Signed)
Date:  12/27/2021   Name:  CLARISE CHACKO   DOB:  [birth year]   MRN:  [id number]   Chief Complaint: Annual Exam (Breast exam no pap ) [name] is a 76 y.o. female who presents today for her Complete Annual Exam. She feels well. She reports exercising walking 2-3 days. She reports she is sleeping well. Breast complaints none.  She has some good days and other days can not seem to get things done.  Mild decrease in short term memory but uses pill boxes to keep meds organized.  Mammogram: 12/2020 at [place]: 04/2014 Pap smear: discontinued Colonoscopy: 08/2021 - repeat due 6 months  Immunization History  Administered Date(s) Administered   Fluad Quad(high Dose 65+) 08/29/2019, 08/23/2020, 08/01/2021   Influenza, High Dose Seasonal PF 09/21/2017, 09/02/2018   Influenza,inj,Quad PF,6+ Mos 08/20/2015, 09/17/2016   PFIZER(Purple Top)SARS-COV-2 Vaccination 01/10/2020, 02/10/2020   Pneumococcal Conjugate-13 10/22/2015   Pneumococcal Polysaccharide-23 07/17/2017   Tdap 12/19/2014    Hypertension This is a chronic problem. The problem is controlled. Pertinent negatives include no chest pain, headaches, palpitations or shortness of breath. Past treatments include beta blockers and ACE inhibitors.  Diabetes She presents for her follow-up diabetic visit. She has type 2 diabetes mellitus. Pertinent negatives for hypoglycemia include no dizziness, headaches, nervousness/anxiousness or tremors. Pertinent negatives for diabetes include no chest pain, no fatigue, no polydipsia and no polyuria. Current diabetic treatment includes insulin injections ([place]).  Hyperlipidemia This is a chronic problem. The problem is controlled. Recent lipid tests were reviewed and are normal. Pertinent negatives include no chest pain or shortness of breath. Current antihyperlipidemic treatment includes statins. The current treatment provides significant improvement of lipids. There are no compliance problems.     Lab Results  Component Value Date   NA 132 (L) 04/23/2021   K 4.3 04/23/2021   CO2 26 04/23/2021   GLUCOSE 173 (H) 04/23/2021   BUN 14 04/23/2021   CREATININE 0.95 04/23/2021   CALCIUM 8.8 (L) 04/23/2021   GFRNONAA >60 04/23/2021   Lab Results  Component Value Date   CHOL 204 (H) 12/25/2020   HDL 99 12/25/2020   LDLCALC 93 12/25/2020   TRIG 67 12/25/2020   CHOLHDL 2.1 12/25/2020   Lab Results  Component Value Date   TSH 1.710 12/25/2020   Lab Results  Component Value Date   HGBA1C 9.1 (A) 10/01/2021   Lab Results  Component Value Date   WBC 8.0 04/23/2021   HGB 11.6 (L) 04/23/2021   HCT 35.6 (L) 04/23/2021   MCV 90.1 04/23/2021   PLT 249 04/23/2021   Lab Results  Component Value Date   ALT 15 04/23/2021   AST 18 04/23/2021   ALKPHOS 98 04/23/2021   BILITOT 0.5 04/23/2021   Lab Results  Component Value Date   VD25OH 35.3 12/25/2020     Review of Systems  Constitutional:  Negative for chills, fatigue and fever.  HENT:  Negative for congestion, hearing loss, tinnitus, trouble swallowing and voice change.   Eyes:  Negative for visual disturbance.  Respiratory:  Negative for cough, chest tightness, shortness of breath and wheezing.   Cardiovascular:  Negative for chest pain, palpitations and leg swelling.  Gastrointestinal:  Negative for abdominal pain, constipation, diarrhea and vomiting.  Endocrine: Negative for polydipsia and polyuria.  Genitourinary:  Negative for dysuria, frequency, genital sores, vaginal bleeding and vaginal discharge.  Musculoskeletal:  Positive for arthralgias and gait problem (mild instability). Negative for joint swelling.  Skin:  Negative  for color change and rash.  Neurological:  Negative for dizziness, tremors, light-headedness and headaches.  Hematological:  Negative for adenopathy. Does not bruise/bleed easily.  Psychiatric/Behavioral:  Negative for dysphoric mood and sleep disturbance. The patient is not nervous/anxious.     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/10/2019   Bilateral carotid artery stenosis 08/24/2018   Atherosclerosis of aorta ([place]) 08/24/2018   Type II diabetes mellitus with complication ([place]) 46/65/9935   Bradycardia 07/24/2018   Neoplasm of uncertain behavior of skin 01/16/2017   Senile ecchymosis 08/20/2015   Essential (primary) hypertension 05/08/2015   History of rectal cancer 05/08/2015   Hyperlipidemia associated with type 2 diabetes mellitus ([place]) 05/08/2015   Seizure disorder ([place]) 05/08/2015   Shoulder strain 05/08/2015   Avitaminosis D 05/08/2015    Allergies  Allergen Reactions   Baclofen Other (See Comments)    Hallucinations   Pioglitazone Nausea Only    Past Surgical History:  Procedure Laterality Date   CATARACT EXTRACTION, BILATERAL  01/2020   Dr. Lucianne Lei [facility]   COLOSTOMY REVERSAL  01/2014   ILEOSTOMY  05/176   UMBILICAL HERNIA REPAIR  05/2018    Social History   Tobacco Use   Smoking status: Never   Smokeless tobacco: Never  Vaping Use   Vaping Use: Never used  Substance Use Topics   Alcohol use: No    Alcohol/week: 0.0 standard drinks   Drug use: No     Medication list has been reviewed and updated.  Current Meds  Medication Sig   aspirin 81 MG chewable tablet Chew 81 mg by mouth daily.    atorvastatin (LIPITOR) 40 MG tablet TAKE 1 TABLET BY MOUTH DAILY   Bacillus Coagulans-Inulin (PROBIOTIC) 1-250 BILLION-MG CAPS Take 1 capsule by mouth daily.   calcium carbonate (OSCAL) 1500 (600 Ca) MG TABS tablet Take 600 mg of elemental calcium by mouth daily.   carbamazepine (TEGRETOL) 200 MG tablet TAKE 1 TABLET(200 MG) BY MOUTH DAILY   Cholecalciferol 25 MCG (1000 UT) capsule Take 1,000 Units by mouth daily.    docusate sodium (COLACE) 250 MG capsule Take 250 mg by mouth daily.   empagliflozin (JARDIANCE) 25 MG TABS tablet Take 1 tablet (25 mg total) by mouth daily  before breakfast.   Fluocinolone Acetonide 0.01 % OIL Place 1 drop into both ears at bedtime. Alternating ears each night   HYDROcodone-acetaminophen (NORCO/VICODIN) 5-325 MG tablet Take 1-2 tablets by mouth every 6 (six) hours as needed for moderate pain.   LANTUS SOLOSTAR 100 UNIT/ML Solostar Pen INJECT 20 TO 25 UNITS UNDER THE SKIN DAILY.   metoprolol succinate (TOPROL-XL) 25 MG 24 hr tablet TAKE 1/2 TABLET(12.5 MG) BY MOUTH IN THE MORNING AND AT BEDTIME   nitroGLYCERIN (NITROSTAT) 0.4 MG SL tablet Place 1 tablet (0.4 mg total) under the tongue every 5 (five) minutes x 3 doses as needed for chest pain.   ONETOUCH ULTRA test strip USE 1 STRIP TO CHECK GLUCOSE TWICE DAILY   pantoprazole (PROTONIX) 40 MG tablet Take 1 tablet (40 mg total) by mouth daily as needed (heartburn).   Propylene Glycol (SYSTANE BALANCE OP) Place 1 drop into both eyes 2 (two) times daily as needed (dry eyes).   ramipril (ALTACE) 10 MG capsule Take 1 capsule (10 mg total) by mouth daily.   trolamine salicylate (ASPERCREME) 10 % cream Apply 1 application topically as needed for muscle pain.    PHQ  2/9 Scores 12/27/2021 12/25/2021 10/01/2021 08/01/2021  PHQ - 2 Score 0 1 1 0  PHQ- 9 Score 2 3 6 5     GAD 7 : Generalized Anxiety Score 12/27/2021 10/01/2021 08/01/2021 04/29/2021  Nervous, Anxious, on Edge 1 1 1  0  Control/stop worrying 0 1 0 0  Worry too much - different things 1 1 0 0  Trouble relaxing 0 1 1 0  Restless 0 0 0 0  Easily annoyed or irritable 0 0 0 0  Afraid - awful might happen 0 1 1 0  Total GAD 7 Score 2 5 3  0  Anxiety Difficulty - Not difficult at all - -    BP Readings from Last 3 Encounters:  12/27/21 (!) 128/58  10/01/21 122/78  08/01/21 (!) 158/82    Physical Exam Vitals and nursing note reviewed.  Constitutional:      General: She is not in acute distress.    Appearance: She is well-developed.  HENT:     Head: Normocephalic and atraumatic.     Right Ear: Tympanic membrane and ear canal  normal.     Left Ear: Tympanic membrane and ear canal normal.     Nose:     Right Sinus: No maxillary sinus tenderness.     Left Sinus: No maxillary sinus tenderness.  Eyes:     General: No scleral icterus.       Right eye: No discharge.        Left eye: No discharge.     Conjunctiva/sclera: Conjunctivae normal.  Neck:     Thyroid: No thyromegaly.     Vascular: No carotid bruit.  Cardiovascular:     Rate and Rhythm: Normal rate and regular rhythm.     Pulses: Normal pulses.     Heart sounds: Normal heart sounds.  Pulmonary:     Effort: Pulmonary effort is normal. No respiratory distress.     Breath sounds: No wheezing.  Chest:  Breasts:    Right: No mass, nipple discharge, skin change or tenderness.     Left: No mass, nipple discharge, skin change or tenderness.  Abdominal:     General: Bowel sounds are normal.     Palpations: Abdomen is soft.     Tenderness: There is no abdominal tenderness.  Musculoskeletal:     Left hand: Tenderness (of palm at base of ring finger) present.     Cervical back: Normal range of motion. No erythema.     Right lower leg: No edema.     Left lower leg: No edema.  Lymphadenopathy:     Cervical: No cervical adenopathy.  Skin:    General: Skin is warm and dry.     Findings: No rash.  Neurological:     Mental Status: She is alert and oriented to person, place, and time.     Cranial Nerves: No cranial nerve deficit.     Sensory: No sensory deficit.     Deep Tendon Reflexes: Reflexes are normal and symmetric.  Psychiatric:        Attention and Perception: Attention normal.        Mood and Affect: Mood normal.    Wt Readings from Last 3 Encounters:  12/27/21 153 lb (69.4 kg)  10/01/21 152 lb 6.4 oz (69.1 kg)  08/01/21 151 lb (68.5 kg)    BP (!) 128/58 (BP Location: Right Arm, Cuff Size: Large)    Pulse (!) 59    Ht 5\' 2"  (1.575 m)    Wt 153  lb (69.4 kg)    SpO2 97%    BMI 27.98 kg/m   Assessment and Plan: 1. Annual physical exam Normal  exam for age. Up to date on screenings and immunizations.  2. Encounter for screening mammogram for breast cancer She will schedule this at [facility]  3. Essential (primary) hypertension Clinically stable exam with well controlled BP. Tolerating medications without side effects at this time. Pt to continue current regimen and low sodium diet; benefits of regular exercise as able discussed. - CBC with Differential/Platelet - TSH - POCT urinalysis dipstick  4. Type II diabetes mellitus with complication (HCC) Clinically stable by exam and report without s/s of hypoglycemia. Range 87-162. DM complicated by hypertension and dyslipidemia. Tolerating medications well without side effects or other concerns. Sees Podiatry. - Comprehensive metabolic panel - Hemoglobin A1c - Microalbumin / creatinine urine ratio  5. Hyperlipidemia associated with type 2 diabetes mellitus ([place]) Tolerating statin medication without side effects at this time LDL is at goal of < 70 on current dose Continue same therapy without change at this time. - Lipid panel  6. Trigger finger, left ring finger - Ambulatory referral to Orthopedic Surgery  7. Seizure disorder ([place]) None recently. Continue current medication. - Carbamazepine level, total; Future  8. Atherosclerosis of aorta (HCC) On statin and aspirin.   Partially dictated using Editor, commissioning. Any errors are unintentional.  Halina Maidens, MD [facility]  12/27/2021

## 2021-12-28 LAB — COMPREHENSIVE METABOLIC PANEL
ALT: 10 IU/L (ref 0–32)
AST: 15 IU/L (ref 0–40)
Albumin/Globulin Ratio: 1.8 (ref 1.2–2.2)
Albumin: 4.2 g/dL (ref 3.7–4.7)
Alkaline Phosphatase: 143 IU/L — ABNORMAL HIGH (ref 44–121)
BUN/Creatinine Ratio: 12 (ref 12–28)
BUN: 15 mg/dL (ref 8–27)
Bilirubin Total: 0.3 mg/dL (ref 0.0–1.2)
CO2: 24 mmol/L (ref 20–29)
Calcium: 9.6 mg/dL (ref 8.7–10.3)
Chloride: 100 mmol/L (ref 96–106)
Creatinine, Ser: 1.21 mg/dL — ABNORMAL HIGH (ref 0.57–1.00)
Globulin, Total: 2.3 g/dL (ref 1.5–4.5)
Glucose: 104 mg/dL — ABNORMAL HIGH (ref 70–99)
Potassium: 4.6 mmol/L (ref 3.5–5.2)
Sodium: 137 mmol/L (ref 134–144)
Total Protein: 6.5 g/dL (ref 6.0–8.5)
eGFR: 46 mL/min/{1.73_m2} — ABNORMAL LOW (ref >59)

## 2021-12-28 LAB — CBC WITH DIFFERENTIAL/PLATELET
Basophils Absolute: 0 10*3/uL (ref 0.0–0.2)
Basos: 0 %
EOS (ABSOLUTE): 0.1 10*3/uL (ref 0.0–0.4)
Eos: 1 %
Hematocrit: 41 % (ref 34.0–46.6)
Hemoglobin: 13.4 g/dL (ref 11.1–15.9)
Immature Grans (Abs): 0 10*3/uL (ref 0.0–0.1)
Immature Granulocytes: 0 %
Lymphocytes Absolute: 1 10*3/uL (ref 0.7–3.1)
Lymphs: 14 %
MCH: 29.6 pg (ref 26.6–33.0)
MCHC: 32.7 g/dL (ref 31.5–35.7)
MCV: 91 fL (ref 79–97)
Monocytes Absolute: 0.9 10*3/uL (ref 0.1–0.9)
Monocytes: 12 %
Neutrophils Absolute: 5.4 10*3/uL (ref 1.4–7.0)
Neutrophils: 73 %
Platelets: 215 10*3/uL (ref 150–450)
RBC: 4.52 x10E6/uL (ref 3.77–5.28)
RDW: 12.5 % (ref 11.7–15.4)
WBC: 7.4 10*3/uL (ref 3.4–10.8)

## 2021-12-28 LAB — LIPID PANEL
Chol/HDL Ratio: 2.2 ratio (ref 0.0–4.4)
Cholesterol, Total: 179 mg/dL (ref 100–199)
HDL: 83 mg/dL (ref >39)
LDL Chol Calc (NIH): 82 mg/dL (ref 0–99)
Triglycerides: 75 mg/dL (ref 0–149)
VLDL Cholesterol Cal: 14 mg/dL (ref 5–40)

## 2021-12-28 LAB — HEMOGLOBIN A1C
Est. average glucose Bld gHb Est-mCnc: 223 mg/dL
Hgb A1c MFr Bld: 9.4 % — ABNORMAL HIGH (ref 4.8–5.6)

## 2021-12-28 LAB — TSH: TSH: 1.66 u[IU]/mL (ref 0.450–4.500)

## 2021-12-29 LAB — MICROALBUMIN / CREATININE URINE RATIO
Creatinine, Urine: 89.2 mg/dL
Microalb/Creat Ratio: 10 mg/g creat (ref 0–29)
Microalbumin, Urine: 8.9 ug/mL

## 2022-01-01 ENCOUNTER — Ambulatory Visit: Payer: Medicare Other | Admitting: Internal Medicine

## 2022-01-28 ENCOUNTER — Other Ambulatory Visit: Payer: Self-pay | Admitting: Internal Medicine

## 2022-01-28 DIAGNOSIS — I10 Essential (primary) hypertension: Secondary | ICD-10-CM

## 2022-01-28 DIAGNOSIS — G40909 Epilepsy, unspecified, not intractable, without status epilepticus: Secondary | ICD-10-CM

## 2022-01-28 NOTE — Telephone Encounter (Signed)
Requested Prescriptions  ?Pending Prescriptions Disp Refills  ?? carbamazepine (TEGRETOL) 200 MG tablet [Pharmacy Med Name: CARBAMAZEPINE '200MG'$  TABLETS] 90 tablet 0  ?  Sig: TAKE 1 TABLET(200 MG) BY MOUTH DAILY  ?  ? Neurology:  Anticonvulsants - carbamazepine Failed - 01/28/2022 10:06 AM  ?  ?  Failed - Carbamazepine (serum) in normal range and within 360 days  ?  Carbamazepine (Tegretol), S  ?Date Value Ref Range Status  ?12/21/2019 6.0 4.0 - 12.0 ug/mL Final  ?  Comment:  ?           In conjunction with other antiepileptic drugs ?                               Therapeutic  4.0 -  8.0 ?                               Toxicity     9.0 - 12.0 ?                                   Carbamazepine alone ?                               Therapeutic  8.0 - 12.0 ?                                Detection Limit =  2.0 ?                          <2.0 indicated None Detected ?  ?   ?  ?  Failed - Cr in normal range and within 360 days  ?  Creatinine  ?Date Value Ref Range Status  ?05/04/2013 1.01 0.60 - 1.30 mg/dL Final  ? ?Creatinine, Ser  ?Date Value Ref Range Status  ?12/27/2021 1.21 (H) 0.57 - 1.00 mg/dL Final  ?   ?  ?  Passed - AST in normal range and within 360 days  ?  AST  ?Date Value Ref Range Status  ?12/27/2021 15 0 - 40 IU/L Final  ? ?SGOT(AST)  ?Date Value Ref Range Status  ?05/04/2013 11 (L) 15 - 37 Unit/L Final  ?   ?  ?  Passed - ALT in normal range and within 360 days  ?  ALT  ?Date Value Ref Range Status  ?12/27/2021 10 0 - 32 IU/L Final  ? ?SGPT (ALT)  ?Date Value Ref Range Status  ?05/04/2013 19 12 - 78 U/L Final  ?   ?  ?  Passed - WBC in normal range and within 360 days  ?  WBC  ?Date Value Ref Range Status  ?12/27/2021 7.4 3.4 - 10.8 x10E3/uL Final  ?04/23/2021 8.0 4.0 - 10.5 K/uL Final  ?   ?  ?  Passed - PLT in normal range and within 360 days  ?  Platelets  ?Date Value Ref Range Status  ?12/27/2021 215 150 - 450 x10E3/uL Final  ?   ?  ?  Passed - HGB in normal range and within 360 days  ?  Hemoglobin   ?Date Value Ref Range Status  ?12/27/2021 13.4 11.1 -  15.9 g/dL Final  ?   ?  ?  Passed - Na in normal range and within 360 days  ?  Sodium  ?Date Value Ref Range Status  ?12/27/2021 137 134 - 144 mmol/L Final  ?04/13/2013 136 136 - 145 mmol/L Final  ?   ?  ?  Passed - HCT in normal range and within 360 days  ?  Hematocrit  ?Date Value Ref Range Status  ?12/27/2021 41.0 34.0 - 46.6 % Final  ?   ?  ?  Passed - Completed PHQ-2 or PHQ-9 in the last 360 days  ?  ?  Passed - Valid encounter within last 12 months  ?  Recent Outpatient Visits   ?      ? 1 month ago Annual physical exam  ? [facility] Glean Hess, [facility]  ? 3 months ago Essential (primary) hypertension  ? [facility] Glean Hess, MD  ? 6 months ago Type II diabetes mellitus with complication [facility])  ? [facility] Glean Hess, MD  ? 9 months ago Type II diabetes mellitus with complication [facility])  ? [facility] Glean Hess, MD  ? 9 months ago Type II diabetes mellitus with complication [facility])  ? [facility] Glean Hess, MD  ?  ?  ?Future Appointments   ?        ? In 11 months Glean Hess, MD [facility]  ?  ? ?  ?  ?  ?? ramipril (ALTACE) 10 MG capsule [Pharmacy Med Name: RAMIPRIL '10MG'$  CAPSULES] 90 capsule 1  ?  Sig: TAKE 1 CAPSULE(10 MG) BY MOUTH DAILY  ?  ? Cardiovascular:  ACE Inhibitors Failed - 01/28/2022 10:06 AM  ?  ?  Failed - Cr in normal range and within 180 days  ?  Creatinine  ?Date Value Ref Range Status  ?05/04/2013 1.01 0.60 - 1.30 mg/dL Final  ? ?Creatinine, Ser  ?Date Value Ref Range Status  ?12/27/2021 1.21 (H) 0.57 - 1.00 mg/dL Final  ?   ?  ?  Passed - K in normal range and within 180 days  ?  Potassium  ?Date Value Ref Range Status  ?12/27/2021 4.6 3.5 - 5.2 mmol/L Final  ?05/04/2013 4.0 3.5 - 5.1 mmol/L Final  ?   ?  ?  Passed - Patient is not pregnant  ?  ?  Passed - Last BP in normal range  ?  BP Readings from Last 1 Encounters:  ?12/27/21 (!)  128/58  ?   ?  ?  Passed - Valid encounter within last 6 months  ?  Recent Outpatient Visits   ?      ? 1 month ago Annual physical exam  ? [facility] Glean Hess, MD  ? 3 months ago Essential (primary) hypertension  ? [facility] Glean Hess, MD  ? 6 months ago Type II diabetes mellitus with complication [facility])  ? [facility] Glean Hess, MD  ? 9 months ago Type II diabetes mellitus with complication [facility])  ? [facility] Glean Hess, MD  ? 9 months ago Type II diabetes mellitus with complication [facility])  ? [facility] Glean Hess, MD  ?  ?  ?Future Appointments   ?        ? In 11 months Glean Hess, MD [facility] [place]  ?  ? ?  ?  ?  ? ?

## 2022-02-04 DIAGNOSIS — G5621 Lesion of ulnar nerve, right upper limb: Secondary | ICD-10-CM | POA: Diagnosis not present

## 2022-02-04 DIAGNOSIS — G5602 Carpal tunnel syndrome, left upper limb: Secondary | ICD-10-CM | POA: Diagnosis not present

## 2022-02-04 DIAGNOSIS — M65342 Trigger finger, left ring finger: Secondary | ICD-10-CM | POA: Diagnosis not present

## 2022-02-04 DIAGNOSIS — G5601 Carpal tunnel syndrome, right upper limb: Secondary | ICD-10-CM | POA: Diagnosis not present

## 2022-02-27 DIAGNOSIS — G5603 Carpal tunnel syndrome, bilateral upper limbs: Secondary | ICD-10-CM | POA: Diagnosis not present

## 2022-02-28 ENCOUNTER — Other Ambulatory Visit: Payer: Self-pay | Admitting: Internal Medicine

## 2022-02-28 DIAGNOSIS — E1169 Type 2 diabetes mellitus with other specified complication: Secondary | ICD-10-CM

## 2022-03-03 DIAGNOSIS — Z1231 Encounter for screening mammogram for malignant neoplasm of breast: Secondary | ICD-10-CM | POA: Diagnosis not present

## 2022-03-03 NOTE — Telephone Encounter (Signed)
Requested Prescriptions  ?Pending Prescriptions Disp Refills  ?? atorvastatin (LIPITOR) 40 MG tablet [Pharmacy Med Name: ATORVASTATIN '40MG'$  TABLETS] 90 tablet 0  ?  Sig: TAKE 1 TABLET BY MOUTH DAILY  ?  ? Cardiovascular:  Antilipid - Statins Failed - 02/28/2022  3:43 PM  ?  ?  Failed - Lipid Panel in normal range within the last 12 months  ?  Cholesterol, Total  ?Date Value Ref Range Status  ?12/27/2021 179 100 - 199 mg/dL Final  ? ?LDL Chol Calc (NIH)  ?Date Value Ref Range Status  ?12/27/2021 82 0 - 99 mg/dL Final  ? ?HDL  ?Date Value Ref Range Status  ?12/27/2021 83 >39 mg/dL Final  ? ?Triglycerides  ?Date Value Ref Range Status  ?12/27/2021 75 0 - 149 mg/dL Final  ? ?  ?  ?  Passed - Patient is not pregnant  ?  ?  Passed - Valid encounter within last 12 months  ?  Recent Outpatient Visits   ?      ? 2 months ago Annual physical exam  ? [facility] Glean [facility], [facility]  ? 5 months ago Essential (primary) hypertension  ? [facility] Glean Hess, MD  ? 7 months ago Type II diabetes mellitus with complication [facility])  ? [facility] Glean Hess, MD  ? 10 months ago Type II diabetes mellitus with complication [facility])  ? [facility] Glean Hess, MD  ? 10 months ago Type II diabetes mellitus with complication [facility])  ? [facility] Glean Hess, MD  ?  ?  ?Future Appointments   ?        ? In 10 months Glean Hess, MD [facility] [place]  ?  ? ?  ?  ?  ? ? ?

## 2022-03-05 ENCOUNTER — Other Ambulatory Visit: Payer: Self-pay | Admitting: Internal Medicine

## 2022-03-05 DIAGNOSIS — E118 Type 2 diabetes mellitus with unspecified complications: Secondary | ICD-10-CM

## 2022-03-05 NOTE — Telephone Encounter (Signed)
Requested Prescriptions  ?Pending Prescriptions Disp Refills  ?? LANTUS SOLOSTAR 100 UNIT/ML Solostar Pen [Pharmacy Med Name: LANTUS SOLOSTAR PEN INJ 3ML] 30 mL 0  ?  Sig: INJECT 20 TO 25 UNITS UNDER THE SKIN DAILY  ?  ? Endocrinology:  Diabetes - Insulins Failed - 03/05/2022 10:07 AM  ?  ?  Failed - HBA1C is between 0 and 7.9 and within 180 days  ?  Hgb A1c MFr Bld  ?Date Value Ref Range Status  ?12/27/2021 9.4 (H) 4.8 - 5.6 % Final  ?  Comment:  ?           Prediabetes: 5.7 - 6.4 ?         Diabetes: >6.4 ?         Glycemic control for adults with diabetes: <7.0 ?  ?   ?  ?  Passed - Valid encounter within last 6 months  ?  Recent Outpatient Visits   ?      ? 2 months ago Annual physical exam  ? [facility] Glean Hess, MD  ? 5 months ago Essential (primary) hypertension  ? [facility] Glean Hess, MD  ? 7 months ago Type II diabetes mellitus with complication [facility])  ? [facility] Glean Hess, MD  ? 10 months ago Type II diabetes mellitus with complication [facility])  ? [facility] Glean Hess, MD  ? 10 months ago Type II diabetes mellitus with complication [facility])  ? [facility] Glean Hess, MD  ?  ?  ?Future Appointments   ?        ? In 10 months Glean Hess, MD [facility] [place]  ?  ? ?  ?  ?  ? ?

## 2022-03-07 ENCOUNTER — Other Ambulatory Visit: Payer: Self-pay | Admitting: Internal Medicine

## 2022-03-07 NOTE — Telephone Encounter (Signed)
Requested Prescriptions  ?Pending Prescriptions Disp Refills  ?? ONETOUCH ULTRA test strip [Pharmacy Med Name: OneTouch Ultra Blue In Vitro Strip] 100 each 0  ?  Sig: USE 1 STRIP TO CHECK GLUCOSE TWICE DAILY  ?  ? Endocrinology: Diabetes - Testing Supplies Passed - 03/07/2022  9:59 AM  ?  ?  Passed - Valid encounter within last 12 months  ?  Recent Outpatient Visits   ?      ? 2 months ago Annual physical exam  ? [facility] Glean Hess, MD  ? 5 months ago Essential (primary) hypertension  ? [facility] Glean Hess, MD  ? 7 months ago Type II diabetes mellitus with complication [facility])  ? [facility] Glean Hess, MD  ? 10 months ago Type II diabetes mellitus with complication [facility])  ? [facility] Gle[facility], [facility]  ? 10 months ago Type II diabetes mellitus with complication [facility])  ? [facility] Glean Hess, MD  ?  ?  ?Future Appointments   ?        ? In 9 months Glean Hess, MD [facility] [place]  ?  ? ?  ?  ?  ? ? ?

## 2022-03-11 DIAGNOSIS — G5603 Carpal tunnel syndrome, bilateral upper limbs: Secondary | ICD-10-CM | POA: Diagnosis not present

## 2022-03-11 DIAGNOSIS — G5601 Carpal tunnel syndrome, right upper limb: Secondary | ICD-10-CM | POA: Diagnosis not present

## 2022-03-11 DIAGNOSIS — M65321 Trigger finger, right index finger: Secondary | ICD-10-CM | POA: Diagnosis not present

## 2022-03-11 DIAGNOSIS — M65342 Trigger finger, left ring finger: Secondary | ICD-10-CM | POA: Diagnosis not present

## 2022-03-18 DIAGNOSIS — Z794 Long term (current) use of insulin: Secondary | ICD-10-CM | POA: Diagnosis not present

## 2022-03-18 DIAGNOSIS — Z01818 Encounter for other preprocedural examination: Secondary | ICD-10-CM | POA: Diagnosis not present

## 2022-03-18 DIAGNOSIS — I10 Essential (primary) hypertension: Secondary | ICD-10-CM | POA: Diagnosis not present

## 2022-03-18 DIAGNOSIS — C20 Malignant neoplasm of rectum: Secondary | ICD-10-CM | POA: Diagnosis not present

## 2022-03-18 DIAGNOSIS — I6523 Occlusion and stenosis of bilateral carotid arteries: Secondary | ICD-10-CM | POA: Diagnosis not present

## 2022-03-18 DIAGNOSIS — E119 Type 2 diabetes mellitus without complications: Secondary | ICD-10-CM | POA: Diagnosis not present

## 2022-03-18 DIAGNOSIS — G40909 Epilepsy, unspecified, not intractable, without status epilepticus: Secondary | ICD-10-CM | POA: Diagnosis not present

## 2022-03-22 ENCOUNTER — Other Ambulatory Visit: Payer: Self-pay | Admitting: Internal Medicine

## 2022-03-22 DIAGNOSIS — E118 Type 2 diabetes mellitus with unspecified complications: Secondary | ICD-10-CM

## 2022-03-24 NOTE — Telephone Encounter (Signed)
Requested Prescriptions  ?Pending Prescriptions Disp Refills  ?? JARDIANCE 25 MG TABS tablet [Pharmacy Med Name: JARDIANCE 25MG TABLETS] 30 tablet 3  ?  Sig: TAKE 1 TABLET(25 MG) BY MOUTH DAILY BEFORE BREAKFAST  ?  ? Endocrinology:  Diabetes - SGLT2 Inhibitors Failed - 03/22/2022  3:06 PM  ?  ?  Failed - Cr in normal range and within 360 days  ?  Creatinine  ?Date Value Ref Range Status  ?05/04/2013 1.01 0.60 - 1.30 mg/dL Final  ? ?Creatinine, Ser  ?Date Value Ref Range Status  ?12/27/2021 1.21 (H) 0.57 - 1.00 mg/dL Final  ?   ?  ?  Failed - HBA1C is between 0 and 7.9 and within 180 days  ?  Hgb A1c MFr Bld  ?Date Value Ref Range Status  ?12/27/2021 9.4 (H) 4.8 - 5.6 % Final  ?  Comment:  ?           Prediabetes: 5.7 - 6.4 ?         Diabetes: >6.4 ?         Glycemic control for adults with diabetes: <7.0 ?  ?   ?  ?  Failed - eGFR in normal range and within 360 days  ?  EGFR (African American)  ?Date Value Ref Range Status  ?05/04/2013 >60  Final  ? ?GFR calc Af Wyvonnia Lora  ?Date Value Ref Range Status  ?12/25/2020 54 (L) >59 mL/min/1.73 Final  ?  Comment:  ?  **In accordance with recommendations from the NKF-ASN Task force,** ?  [facility] is in the process of updating its eGFR calculation to the ?  2021 CKD-EPI creatinine equation that estimates kidney function ?  without a race variable. ?  ? ?EGFR (Non-African Amer.)  ?Date Value Ref Range Status  ?05/04/2013 58 (L)  Final  ?  Comment:  ?  eGFR values <51m/min/1.73 m2 may be an indication of chronic ?kidney disease (CKD). ?Calculated eGFR is useful in patients with stable renal function. ?The eGFR calculation will not be reliable in acutely ill patients ?when serum creatinine is changing rapidly. It is not useful in  ?patients on dialysis. The eGFR calculation may not be applicable ?to patients at the low and high extremes of body sizes, pregnant ?women, and vegetarians. ?  ? ?GFR, Estimated  ?Date Value Ref Range Status  ?04/23/2021 >60 >60 mL/min Final  ?  Comment:  ?   (NOTE) ?Calculated using the CKD-EPI Creatinine Equation (2021) ?  ? ?eGFR  ?Date Value Ref Range Status  ?12/27/2021 46 (L) >59 mL/min/1.73 Final  ?   ?  ?  Passed - Valid encounter within last 6 months  ?  Recent Outpatient Visits   ?      ? 2 months [facility]  ? 5 months ago Essential (primary) hypertension  [facility]  ? 7 months ago Type II diabetes mellitus with complication ([facility]  ? [facility]  ? 10 months ago Type II diabetes mellitus with complication ([facility]  ? [facility]  ? 11 months ago Type II diabetes mellitus with complication ([facility]  ? [facility]  ?  ?  ?Future Appointments   ?        ? In 9 months BGlean Hess MD [facility] ?  ? ?  ?  ?  ? ? ?

## 2022-03-26 DIAGNOSIS — H6123 Impacted cerumen, bilateral: Secondary | ICD-10-CM | POA: Diagnosis not present

## 2022-03-26 DIAGNOSIS — H903 Sensorineural hearing loss, bilateral: Secondary | ICD-10-CM | POA: Diagnosis not present

## 2022-04-04 DIAGNOSIS — Z1211 Encounter for screening for malignant neoplasm of colon: Secondary | ICD-10-CM | POA: Diagnosis not present

## 2022-04-04 DIAGNOSIS — D124 Benign neoplasm of descending colon: Secondary | ICD-10-CM | POA: Diagnosis not present

## 2022-04-04 DIAGNOSIS — Z9889 Other specified postprocedural states: Secondary | ICD-10-CM | POA: Diagnosis not present

## 2022-04-04 DIAGNOSIS — E119 Type 2 diabetes mellitus without complications: Secondary | ICD-10-CM | POA: Diagnosis not present

## 2022-04-04 DIAGNOSIS — Z7984 Long term (current) use of oral hypoglycemic drugs: Secondary | ICD-10-CM | POA: Diagnosis not present

## 2022-04-04 DIAGNOSIS — D123 Benign neoplasm of transverse colon: Secondary | ICD-10-CM | POA: Diagnosis not present

## 2022-04-04 DIAGNOSIS — I10 Essential (primary) hypertension: Secondary | ICD-10-CM | POA: Diagnosis not present

## 2022-04-04 DIAGNOSIS — Z9221 Personal history of antineoplastic chemotherapy: Secondary | ICD-10-CM | POA: Diagnosis not present

## 2022-04-04 DIAGNOSIS — Z85048 Personal history of other malignant neoplasm of rectum, rectosigmoid junction, and anus: Secondary | ICD-10-CM | POA: Diagnosis not present

## 2022-04-04 DIAGNOSIS — Z923 Personal history of irradiation: Secondary | ICD-10-CM | POA: Diagnosis not present

## 2022-04-04 DIAGNOSIS — E785 Hyperlipidemia, unspecified: Secondary | ICD-10-CM | POA: Diagnosis not present

## 2022-04-04 DIAGNOSIS — K6389 Other specified diseases of intestine: Secondary | ICD-10-CM | POA: Diagnosis not present

## 2022-04-04 DIAGNOSIS — D122 Benign neoplasm of ascending colon: Secondary | ICD-10-CM | POA: Diagnosis not present

## 2022-04-04 DIAGNOSIS — K6289 Other specified diseases of anus and rectum: Secondary | ICD-10-CM | POA: Diagnosis not present

## 2022-04-04 DIAGNOSIS — Z79899 Other long term (current) drug therapy: Secondary | ICD-10-CM | POA: Diagnosis not present

## 2022-04-04 DIAGNOSIS — Z86010 Personal history of colonic polyps: Secondary | ICD-10-CM | POA: Diagnosis not present

## 2022-04-04 DIAGNOSIS — D369 Benign neoplasm, unspecified site: Secondary | ICD-10-CM | POA: Diagnosis not present

## 2022-04-04 LAB — HM COLONOSCOPY

## 2022-04-16 DIAGNOSIS — H903 Sensorineural hearing loss, bilateral: Secondary | ICD-10-CM | POA: Diagnosis not present

## 2022-04-22 DIAGNOSIS — M65342 Trigger finger, left ring finger: Secondary | ICD-10-CM | POA: Diagnosis not present

## 2022-04-22 DIAGNOSIS — G5601 Carpal tunnel syndrome, right upper limb: Secondary | ICD-10-CM | POA: Diagnosis not present

## 2022-05-03 ENCOUNTER — Other Ambulatory Visit: Payer: Self-pay | Admitting: Internal Medicine

## 2022-05-03 DIAGNOSIS — G40909 Epilepsy, unspecified, not intractable, without status epilepticus: Secondary | ICD-10-CM

## 2022-05-05 NOTE — Telephone Encounter (Signed)
Requested Prescriptions  Pending Prescriptions Disp Refills  . carbamazepine (TEGRETOL) 200 MG tablet [Pharmacy Med Name: CARBAMAZEPINE '200MG'$  TABLETS] 90 tablet 2    Sig: TAKE 1 TABLET(200 MG) BY MOUTH DAILY     Neurology:  Anticonvulsants - carbamazepine Failed - 05/03/2022 10:05 AM      Failed - Carbamazepine (serum) in normal range and within 360 days    Carbamazepine (Tegretol), S  Date Value Ref Range Status  12/21/2019 6.0 4.0 - 12.0 ug/mL Final    Comment:             In conjunction with other antiepileptic drugs                                Therapeutic  4.0 -  8.0                                Toxicity     9.0 - 12.0                                    Carbamazepine alone                                Therapeutic  8.0 - 12.0                                 Detection Limit =  2.0                           <2.0 indicated None Detected          Failed - Cr in normal range and within 360 days    Creatinine  Date Value Ref Range Status  05/04/2013 1.01 0.60 - 1.30 mg/dL Final   Creatinine, Ser  Date Value Ref Range Status  12/27/2021 1.21 (H) 0.57 - 1.00 mg/dL Final         Passed - AST in normal range and within 360 days    AST  Date Value Ref Range Status  12/27/2021 15 0 - 40 IU/L Final   SGOT(AST)  Date Value Ref Range Status  05/04/2013 11 (L) 15 - 37 Unit/L Final         Passed - ALT in normal range and within 360 days    ALT  Date Value Ref Range Status  12/27/2021 10 0 - 32 IU/L Final   SGPT (ALT)  Date Value Ref Range Status  05/04/2013 19 12 - 78 U/L Final         Passed - WBC in normal range and within 360 days    WBC  Date Value Ref Range Status  12/27/2021 7.4 3.4 - 10.8 x10E3/uL Final  04/23/2021 8.0 4.0 - 10.5 K/uL Final         Passed - PLT in normal range and within 360 days    Platelets  Date Value Ref Range Status  12/27/2021 215 150 - 450 x10E3/uL Final         Passed - HGB in normal range and within 360 days    Hemoglobin   Date Value Ref Range Status  12/27/2021 13.4 11.1 -  15.9 g/dL Final         Passed - Na in normal range and within 360 days    Sodium  Date Value Ref Range Status  12/27/2021 137 134 - 144 mmol/L Final  04/13/2013 136 136 - 145 mmol/L Final         Passed - HCT in normal range and within 360 days    Hematocrit  Date Value Ref Range Status  12/27/2021 41.0 34.0 - 46.6 % Final         Passed - Completed PHQ-2 or PHQ-9 in the last 360 days      Passed - Valid encounter within last 12 months    Recent Outpatient Visits          4 months ago Annual physical exam   [facility] Glean Hess, MD   7 months ago Essential (primary) hypertension   [facility] Glean Hess, MD   9 months ago Type II diabetes mellitus with complication [facility])   [facility] Glean Hess, MD   1 year ago Type II diabetes mellitus with complication [facility])   [facility] Glean Hess, MD   1 year ago Type II diabetes mellitus with complication [facility])   [facility] Glean Hess, MD      Future Appointments            In 7 months Army Melia Jesse Sans, MD [facility], [facility]

## 2022-05-06 DIAGNOSIS — Z961 Presence of intraocular lens: Secondary | ICD-10-CM | POA: Diagnosis not present

## 2022-05-06 DIAGNOSIS — E133293 Other specified diabetes mellitus with mild nonproliferative diabetic retinopathy without macular edema, bilateral: Secondary | ICD-10-CM | POA: Diagnosis not present

## 2022-05-06 LAB — HM DIABETES EYE EXAM

## 2022-05-12 ENCOUNTER — Encounter: Payer: Self-pay | Admitting: Internal Medicine

## 2022-06-12 ENCOUNTER — Other Ambulatory Visit: Payer: Self-pay | Admitting: Internal Medicine

## 2022-06-12 DIAGNOSIS — E1169 Type 2 diabetes mellitus with other specified complication: Secondary | ICD-10-CM

## 2022-06-13 NOTE — Telephone Encounter (Signed)
Requested Prescriptions  Pending Prescriptions Disp Refills  . atorvastatin (LIPITOR) 40 MG tablet [Pharmacy Med Name: ATORVASTATIN '40MG'$  TABLETS] 90 tablet 0    Sig: TAKE 1 TABLET BY MOUTH DAILY     Cardiovascular:  Antilipid - Statins Failed - 06/12/2022  9:55 AM      Failed - Lipid Panel in normal range within the last 12 months    Cholesterol, Total  Date Value Ref Range Status  12/27/2021 179 100 - 199 mg/dL Final   LDL Chol Calc (NIH)  Date Value Ref Range Status  12/27/2021 82 0 - 99 mg/dL Final   HDL  Date Value Ref Range Status  12/27/2021 83 >39 mg/dL Final   Triglycerides  Date Value Ref Range Status  12/27/2021 75 0 - 149 mg/dL Final         Passed - Patient is not pregnant      Passed - Valid encounter within last 12 months    Recent Outpatient Visits          5 months ago Annual physical exam   [facility] Glean Hess, MD   8 months ago Essential (primary) hypertension   [facility] Glean Hess, MD   10 months ago Type II diabetes mellitus with complication [facility])   [facility] Glean Hess, MD   1 year ago Type II diabetes mellitus with complication [facility])   [facility] Glean Hess, MD   1 year ago Type II diabetes mellitus with complication [facility])   [facility] Glean Hess, MD      Future Appointments            In 6 months Army Melia Jesse Sans, MD [facility], [facility]

## 2022-06-19 ENCOUNTER — Emergency Department
Admission: EM | Admit: 2022-06-19 | Discharge: 2022-06-20 | Disposition: A | Discharge status: Home / Self Care | Payer: Medicare Other | Attending: Emergency Medicine | Admitting: Emergency Medicine

## 2022-06-19 ENCOUNTER — Emergency Department: Payer: Medicare Other

## 2022-06-19 ENCOUNTER — Other Ambulatory Visit: Payer: Self-pay

## 2022-06-19 DIAGNOSIS — I700 Atherosclerosis of aorta: Secondary | ICD-10-CM | POA: Diagnosis not present

## 2022-06-19 DIAGNOSIS — M25521 Pain in right elbow: Secondary | ICD-10-CM | POA: Diagnosis not present

## 2022-06-19 DIAGNOSIS — Y9301 Activity, walking, marching and hiking: Secondary | ICD-10-CM | POA: Insufficient documentation

## 2022-06-19 DIAGNOSIS — S20221A Contusion of right back wall of thorax, initial encounter: Secondary | ICD-10-CM | POA: Insufficient documentation

## 2022-06-19 DIAGNOSIS — S0990XA Unspecified injury of head, initial encounter: Secondary | ICD-10-CM | POA: Diagnosis not present

## 2022-06-19 DIAGNOSIS — M542 Cervicalgia: Secondary | ICD-10-CM | POA: Diagnosis not present

## 2022-06-19 DIAGNOSIS — S299XXA Unspecified injury of thorax, initial encounter: Secondary | ICD-10-CM | POA: Diagnosis present

## 2022-06-19 DIAGNOSIS — I10 Essential (primary) hypertension: Secondary | ICD-10-CM | POA: Diagnosis not present

## 2022-06-19 DIAGNOSIS — S2241XA Multiple fractures of ribs, right side, initial encounter for closed fracture: Secondary | ICD-10-CM | POA: Insufficient documentation

## 2022-06-19 DIAGNOSIS — E119 Type 2 diabetes mellitus without complications: Secondary | ICD-10-CM | POA: Diagnosis not present

## 2022-06-19 DIAGNOSIS — Y92002 Bathroom of unspecified non-institutional (private) residence single-family (private) house as the place of occurrence of the external cause: Secondary | ICD-10-CM | POA: Insufficient documentation

## 2022-06-19 DIAGNOSIS — W19XXXA Unspecified fall, initial encounter: Secondary | ICD-10-CM

## 2022-06-19 DIAGNOSIS — R109 Unspecified abdominal pain: Secondary | ICD-10-CM | POA: Diagnosis not present

## 2022-06-19 DIAGNOSIS — W182XXA Fall in (into) shower or empty bathtub, initial encounter: Secondary | ICD-10-CM | POA: Diagnosis not present

## 2022-06-19 DIAGNOSIS — R519 Headache, unspecified: Secondary | ICD-10-CM | POA: Diagnosis not present

## 2022-06-19 DIAGNOSIS — I517 Cardiomegaly: Secondary | ICD-10-CM | POA: Diagnosis not present

## 2022-06-19 DIAGNOSIS — M79631 Pain in right forearm: Secondary | ICD-10-CM | POA: Diagnosis not present

## 2022-06-19 NOTE — ED Provider Notes (Signed)
[facility] Provider Note    Event Date/Time   First MD Initiated Contact with Patient 06/19/22 2348     (approximate)   History   Fall   HPI  [name] is a 76 y.o. female who presents to the ED for evaluation of Fall   I reviewed PCP visit from 2/10.  History of HTN, HLD and DM.  Seizure disorder on carbamazepine.  Patient presents to the ED, accompanied by her daughter who she lives with, for evaluation of right-sided back/flank pain in the setting of a fall that occurred about 36 hours ago.  She reports in the morning of 8/2 she was walking quickly to the bathroom to void when she stumbled and fell, she caught a hand railing in the shower and twisted her around but she struck her right-sided back and flank on a nearby bench for the shower.  Denies any head trauma or syncope.  No reported seizure activity.  She has been ambulatory since that time, but with increasing poorly controlled pain to her right-sided back.  No other falls or injuries.  She presents due to poorly controlled pain and significant pain with inspiration.  Physical Exam   Triage Vital Signs: ED Triage Vitals  Enc Vitals Group     BP 06/19/22 2134 (!) 195/72     Pulse Rate 06/19/22 2134 70     Resp 06/19/22 2134 18     Temp 06/19/22 2134 98.2 F (36.8 C)     Temp Source 06/19/22 2134 Oral     SpO2 06/19/22 2134 99 %     Weight 06/19/22 2131 153 lb (69.4 kg)     Height 06/19/22 2131 '5\' 2"'$  (1.575 m)     Head Circumference --      Peak Flow --      Pain Score 06/19/22 2131 10     Pain Loc --      Pain Edu? --      Excl. in [place]? --     Most recent vital signs: Vitals:   06/19/22 2134  BP: (!) 195/72  Pulse: 70  Resp: 18  Temp: 98.2 F (36.8 C)  SpO2: 99%    General: Awake, no distress.  CV:  Good peripheral perfusion.  Resp:  Normal effort.  Abd:  No distention.  Soft and benign throughout MSK:  \Bruising and mild soft tissue swelling noted to the right-sided  paraspinal thoracic back radiating towards her right flank, just inferior to her axilla. Palpation of all 4 extremities without evidence of deformity, tenderness or trauma.  She is freely ranging her right elbow and able to take it out of a zip up sweater by herself Neuro:  No focal deficits appreciated.   Other:     ED Results / Procedures / Treatments   Labs (all labs ordered are listed, but only abnormal results are displayed) Labs Reviewed - No data to display  EKG   RADIOLOGY CT head interpreted by me without evidence of acute intracranial pathology CT cervical spine interpreted by me without evidence of fracture Plain film of the chest and right-sided ribs interpreted by me with multiple right-sided rib fractures Plain film of the right forearm and elbow interpreted by me without evidence of fracture or dislocation.  Official radiology report(s): DG Forearm Right  Result Date: 06/19/2022 CLINICAL DATA:  Recent fall with forearm pain, initial encounter EXAM: RIGHT FOREARM - 2 VIEW COMPARISON:  None Available. FINDINGS: No acute fracture or dislocation  is noted. Degenerative changes are noted at the first [facility] joint. IMPRESSION: No acute abnormality noted. Electronically Signed   By: Inez Catalina M.D.   On: 06/19/2022 22:30   DG Elbow 2 Views Right  Result Date: 06/19/2022 CLINICAL DATA:  Recent fall with elbow pain, initial encounter EXAM: RIGHT ELBOW - 2 VIEW COMPARISON:  None Available. FINDINGS: There is no evidence of fracture, dislocation, or joint effusion. There is no evidence of arthropathy or other focal bone abnormality. Soft tissues are unremarkable. IMPRESSION: No acute abnormality noted Electronically Signed   By: Inez Catalina M.D.   On: 06/19/2022 22:30   DG Ribs Unilateral W/Chest Right  Result Date: 06/19/2022 CLINICAL DATA:  [place] yesterday.  Pain. EXAM: RIGHT RIBS AND CHEST - 3+ VIEW COMPARISON:  03/24/2021 FINDINGS: Shallow inspiration. Mild cardiac enlargement. No  vascular congestion, edema, or consolidation. No significant effusions. No pneumothorax. Calcification of the aorta. Degenerative changes in the shoulders and spine. Acute appearing displaced rib fractures are demonstrated in the right posterior fifth, sixth, seventh, and eighth ribs. No focal bone lesion or bone destruction. Soft tissues are unremarkable. IMPRESSION: 1. Mild cardiac enlargement. No evidence of active pulmonary disease. 2. Multiple acute right posterior rib fractures. Electronically Signed   By: Lucienne Capers M.D.   On: 06/19/2022 22:29   CT HEAD WO CONTRAST (5MM)  Result Date: 06/19/2022 CLINICAL DATA:  Mechanical fall yesterday morning. Complains of back and neck pain. EXAM: CT HEAD WITHOUT CONTRAST CT CERVICAL SPINE WITHOUT CONTRAST TECHNIQUE: Multidetector CT imaging of the head and cervical spine was performed following the standard protocol without intravenous contrast. Multiplanar CT image reconstructions of the cervical spine were also generated. RADIATION DOSE REDUCTION: This exam was performed according to the departmental dose-optimization program which includes automated exposure control, adjustment of the mA and/or kV according to patient size and/or use of iterative reconstruction technique. COMPARISON:  CT examination dated August 18, 2018 FINDINGS: CT HEAD FINDINGS Brain: No evidence of acute infarction, hemorrhage, hydrocephalus, extra-axial collection or mass lesion/mass effect. Encephalomalacia of the right parietal lobe with coarse calcification and ex vacuo dilatation of the occipital horn of the lateral ventricle, unchanged. Vascular: No hyperdense vessel or unexpected calcification. Skull: Normal. Negative for fracture or focal lesion. Sinuses/Orbits: No acute finding. Other: None. CT CERVICAL SPINE FINDINGS Alignment: Enhanced cervical lordosis. Skull base and vertebrae: No acute fracture. No primary bone lesion or focal pathologic process. Soft tissues and spinal  canal: No prevertebral fluid or swelling. No visible canal hematoma. Disc levels: Severe multilevel degenerate disc disease with disc height loss and osteophytes and associated uncovertebral joint and facet joint arthropathy. C2-C3: Moderate bilateral facet joint arthropathy. No significant spinal canal or neural foraminal stenosis. C3-C4: Disc height loss and uncovertebral joint arthropathy with moderate left and severe right neural foraminal stenosis. C4-C5: Uncovertebral joint arthropathy with moderate bilateral neural foraminal stenosis. Moderate facet joint arthropathy, right worse than the left. C5-C6: Uncovertebral joint and bilateral facet joint arthropathy with severe bilateral neural foraminal stenosis. C6-C7: Disc height loss and bulky osteophytes with spinal canal stenosis. Severe bilateral neural foraminal stenosis. Moderate bilateral facet joint arthropathy. C7-T1: Mild bilateral facet joint arthropathy. No significant spinal canal or neural foraminal stenosis. Upper chest: No acute abnormality. Other: None IMPRESSION: CT head 1.  No acute intracranial abnormality. 2.  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 CT cervical spine: 1.  No acute fracture or traumatic subluxation. 2. 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 Multilevel neural foraminal stenosis as described above. Electronically Signed   By: Keane Police D.O.   On: 06/19/2022 22:06   CT Cervical Spine Wo Contrast  Result Date: 06/19/2022 CLINICAL DATA:  Mechanical fall yesterday morning. Complains of back and neck pain. EXAM: CT HEAD WITHOUT CONTRAST CT CERVICAL SPINE WITHOUT CONTRAST TECHNIQUE: Multidetector CT imaging of the head and cervical spine was performed following the standard protocol without intravenous contrast. Multiplanar CT image reconstructions of the cervical spine were also generated. RADIATION DOSE REDUCTION: This  exam was performed according to the departmental dose-optimization program which includes automated exposure control, adjustment of the mA and/or kV according to patient size and/or use of iterative reconstruction technique. COMPARISON:  CT examination dated August 18, 2018 FINDINGS: CT HEAD FINDINGS Brain: No evidence of acute infarction, hemorrhage, hydrocephalus, extra-axial collection or mass lesion/mass effect. Encephalomalacia of the right parietal lobe with coarse calcification and ex vacuo dilatation of the occipital horn of the lateral ventricle, unchanged. Vascular: No hyperdense vessel or unexpected calcification. Skull: Normal. Negative for fracture or focal lesion. Sinuses/Orbits: No acute finding. Other: None. CT CERVICAL SPINE FINDINGS Alignment: Enhanced cervical lordosis. Skull base and vertebrae: No acute fracture. No primary bone lesion or focal pathologic process. Soft tissues and spinal canal: No prevertebral fluid or swelling. No visible canal hematoma. Disc levels: Severe multilevel degenerate disc disease with disc height loss and osteophytes and associated uncovertebral joint and facet joint arthropathy. C2-C3: Moderate bilateral facet joint arthropathy. No significant spinal canal or neural foraminal stenosis. C3-C4: Disc height loss and uncovertebral joint arthropathy with moderate left and severe right neural foraminal stenosis. C4-C5: Uncovertebral joint arthropathy with moderate bilateral neural foraminal stenosis. Moderate facet joint arthropathy, right worse than the left. C5-C6: Uncovertebral joint and bilateral facet joint arthropathy with severe bilateral neural foraminal stenosis. C6-C7: Disc height loss and bulky osteophytes with spinal canal stenosis. Severe bilateral neural foraminal stenosis. Moderate bilateral facet joint arthropathy. C7-T1: Mild bilateral facet joint arthropathy. No significant spinal canal or neural foraminal stenosis. Upper chest: No acute abnormality.  Other: None IMPRESSION: CT head 1.  No acute intracranial abnormality. 2.  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 CT cervical spine: 1.  No acute fracture or traumatic subluxation. 2. 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 Multilevel neural foraminal stenosis as described above. Electronically Signed   By: Keane Police D.O.   On: 06/19/2022 22:06    PROCEDURES and INTERVENTIONS:  Procedures  Medications  lidocaine (LIDODERM) 5 % 1 patch (1 patch Transdermal Patch Applied 06/20/22 0027)  acetaminophen (TYLENOL) tablet 1,000 mg (1,000 mg Oral Given 06/20/22 0025)  oxyCODONE (Oxy IR/ROXICODONE) immediate release tablet 5 mg (5 mg Oral Given 06/20/22 0025)     IMPRESSION / MDM / ASSESSMENT AND PLAN / ED COURSE  I reviewed the triage vital signs and the nursing notes.  Differential diagnosis includes, but is not limited to, rib fractures, pneumothorax, syncope, seizure, hip fracture  {Patient presents with symptoms of an acute illness or injury that is potentially life-threatening.  76 year old woman who lives at home with her daughter presents about 36 hours after mechanical fall causing multiple closed right-sided rib fractures suitable for trial of outpatient management with initiation of multimodal analgesia and incentive spirometry.  She has bruising over this area, but no evidence of open injury.  No significant signs of trauma otherwise.  Full active and passive range of motion of  all 4 extremities without deformity or tenderness.  X-rays confirm multiple closed contiguous rib fractures on the right.  Provided Tylenol, lidocaine patch and oxycodone with good control of her pain and subsequently pulling decent volumes on incentive spirometry.  We discussed similar measures at home, complications to look out for, return precautions for the ED and follow-up with her PCP.  I considered observation  admission for this patient but considering safe discharge plan with the daughter I think is worth a trial.  Clinical Course as of 06/20/22 0209  Fri Jun 20, 2022  0201 Reassessed after analgesia.  Patient reports much improved pain and reports controlled pain.  She has the incentive spirometer at the bedside, but still in the package.  I opened this up and show her how to use it.  After some coaching, she is able to get up to 750 cc of volume per large inspiration. [DS]    Clinical Course User Index [DS] Vladimir Crofts, MD     FINAL CLINICAL IMPRESSION(S) / ED DIAGNOSES   Final diagnoses:  Fall, initial encounter  Closed fracture of multiple ribs of right side, initial encounter     Rx / DC Orders   ED Discharge Orders          Ordered    lidocaine (LIDODERM) 5 %  Every 12 hours        06/20/22 0208    oxyCODONE (ROXICODONE) 5 MG immediate release tablet  Every 8 hours PRN        06/20/22 0208             Note:  This document was prepared using Dragon voice recognition software and may include unintentional dictation errors.   Vladimir Crofts, MD 06/20/22 [id number]

## 2022-06-19 NOTE — ED Triage Notes (Signed)
Mechanical Fall yesterday morning. C/o back pain, neck pain, right ribs, right elbow, and difficulty breathing deeply due to pain. States she lost her balance going to bathroom and grabbed bars in shower and tripped onto shower bench.   Denies dizziness, syncopal episode, or other sx related to fall. Denies LOC, denies use of blood thinners

## 2022-06-20 MED ORDER — LIDOCAINE 5 % EX PTCH: 1.0000 | MEDICATED_PATCH | Freq: Two times a day (BID) | CUTANEOUS | 1 refills | Status: DC

## 2022-06-20 MED ORDER — OXYCODONE HCL 5 MG PO TABS
5.0000 mg | ORAL_TABLET | Freq: Once | ORAL | Status: AC
  Administered 2022-06-20: 5 mg via ORAL
  Filled 2022-06-20: qty 1

## 2022-06-20 MED ORDER — METHOCARBAMOL 500 MG PO TABS: 500.0000 mg | ORAL_TABLET | Freq: Once | ORAL | Status: DC

## 2022-06-20 MED ORDER — ACETAMINOPHEN 500 MG PO TABS
1000.0000 mg | ORAL_TABLET | Freq: Once | ORAL | Status: AC
  Administered 2022-06-20: 1000 mg via ORAL
  Filled 2022-06-20: qty 2

## 2022-06-20 MED ORDER — LIDOCAINE 5 % EX PTCH
1.0000 | MEDICATED_PATCH | CUTANEOUS | Status: DC
  Administered 2022-06-20: 1 via TRANSDERMAL
  Filled 2022-06-20: qty 1

## 2022-06-20 MED ORDER — OXYCODONE HCL 5 MG PO TABS: 5.0000 mg | ORAL_TABLET | Freq: Three times a day (TID) | ORAL | 0 refills | Status: DC | PRN

## 2022-06-20 NOTE — ED Notes (Signed)
Incentive spirometer provided to pt per MD order. Pt verbalizes understanding instructions for use and states she has used before.

## 2022-06-20 NOTE — Discharge Instructions (Signed)
Use Tylenol for pain and fevers.  Up to 1000 mg per dose, up to 4 times per day.  Do not take more than 4000 mg of Tylenol/acetaminophen within 24 hours..  Please use lidocaine patches at your site of pain.  Apply 1 patch at a time, leave on for 12 hours, then remove for 12 hours.  12 hours on, 12 hours off.  Do not apply more than 1 patch at a time.  Use the oxycodone for more severe/breakthrough pain.  This medication may make you sleepy, so please only use it if you need to.  Use the incentive spirometer to practice your deep breaths.  Keep this with you and use it throughout the day every day.  Follow-up with your PCP in 1-2 weeks for recheck.  Return to the ED with any worsening symptoms, fever and cough, difficulty breathing or uncontrolled pain.

## 2022-06-27 ENCOUNTER — Ambulatory Visit: Payer: Self-pay | Admitting: *Deleted

## 2022-06-27 NOTE — Telephone Encounter (Signed)
  Chief Complaint: right rib pain  Symptoms: severe pain with mobility still hard to cough . Taking oxycodone for pain and using lidocaine patch Frequency: 06/19/22 Pertinent Negatives: Patient denies chest pain no difficultly breathing no fever Disposition: '[]'$ ED /'[x]'$ Urgent Care (no appt availability in office) / '[]'$ Appointment(In office/virtual)/ '[]'$  [facility]/ '[]'$ Home Care/ '[]'$ Refused Recommended Disposition /'[]'$ [place] [facility]/ '[]'$  Follow-up with PCP Additional Notes:   Recommended to go to [facility] / ED if sx worsen. Patient would like to get earlier appt f/u than 07/04/22 due to transportation. Will try to find a ride by then.      Reason for Disposition  MODERATE pain (e.g., interferes with normal activities or awakens from sleep)  Answer Assessment - Initial Assessment Questions 1. LOCATION: "Where does it hurt?" (e.g., left, right)     Right rib area s/p fall on 06/19/22 2. ONSET: "When did the pain start?"     After fall 3. SEVERITY: "How bad is the pain?" (e.g., Scale 1-10; mild, moderate, or severe)   - MILD (1-3): doesn't interfere with normal activities    - MODERATE (4-7): interferes with normal activities or awakens from sleep    - SEVERE (8-10): excruciating pain and patient unable to do normal activities (stays in bed)       9/10 pain  4. PATTERN: "Does the pain come and go, or is it constant?"      When moving  5. CAUSE: "What do you think is causing the pain?"     Broken ribs  6. OTHER SYMPTOMS:  "Do you have any other symptoms?" (e.g., fever, abdomen pain, vomiting, leg weakness, burning with urination, blood in urine)     Soreness all over esp. Right ribs  7. PREGNANCY:  "Is there any chance you are pregnant?" "When was your last menstrual period?"     na  Protocols used: Flank Pain-A-AH

## 2022-07-04 ENCOUNTER — Encounter: Payer: Self-pay | Admitting: Internal Medicine

## 2022-07-04 ENCOUNTER — Ambulatory Visit (INDEPENDENT_AMBULATORY_CARE_PROVIDER_SITE_OTHER): Payer: Medicare Other | Admitting: Internal Medicine

## 2022-07-04 VITALS — BP 136/86 | HR 66 | Ht 62.0 in | Wt 152.0 lb

## 2022-07-04 DIAGNOSIS — E118 Type 2 diabetes mellitus with unspecified complications: Secondary | ICD-10-CM

## 2022-07-04 DIAGNOSIS — I10 Essential (primary) hypertension: Secondary | ICD-10-CM | POA: Diagnosis not present

## 2022-07-04 DIAGNOSIS — S2249XA Multiple fractures of ribs, unspecified side, initial encounter for closed fracture: Secondary | ICD-10-CM | POA: Diagnosis not present

## 2022-07-04 MED ORDER — METOPROLOL SUCCINATE ER 25 MG PO TB24: 12.5000 mg | ORAL_TABLET | Freq: Two times a day (BID) | ORAL | 1 refills | Status: DC

## 2022-07-04 MED ORDER — RAMIPRIL 10 MG PO CAPS: 10.0000 mg | ORAL_CAPSULE | Freq: Every day | ORAL | 1 refills | Status: DC

## 2022-07-04 MED ORDER — OXYCODONE HCL 5 MG PO TABS: 5.0000 mg | ORAL_TABLET | Freq: Three times a day (TID) | ORAL | 0 refills | Status: DC | PRN

## 2022-07-04 MED ORDER — ONETOUCH ULTRA VI STRP: ORAL_STRIP | 0 refills | Status: DC

## 2022-07-04 NOTE — Progress Notes (Signed)
Date:  07/04/2022   Name:  [name]   DOB:  August 28, 1946   MRN:  [id number]   Chief Complaint: Fall (Hurts when coughing )  Fall The accident occurred More than 1 week ago (X2 weeks). The fall occurred while walking. She fell from a height of 3 to 5 ft. Impact surface: shower chair. There was no blood loss. Point of impact: lower back. The pain is present in the right shoulder (ribs side and back on right side). The pain is at a severity of 7/10. The pain is mild. The symptoms are aggravated by movement, standing, rotation and sitting. Pertinent negatives include no abdominal pain or headaches. She has tried acetaminophen (Oxycodone, Lidocaine Patches) for the symptoms. The treatment provided mild relief.  Diabetes She presents for her follow-up diabetic visit. She has type 2 diabetes mellitus. Her disease course has been improving. Pertinent negatives for hypoglycemia include no dizziness, headaches or nervousness/anxiousness. Associated symptoms include chest pain. Pertinent negatives for diabetes include no fatigue and no weakness. Pertinent negatives for diabetic complications include no CVA. Current diabetic treatment includes insulin injections (and jardiance). She is following a generally healthy diet. She monitors blood glucose at home 1-2 x per day. Her breakfast blood glucose is taken between 6-7 am. Her breakfast blood glucose range is generally 130-140 mg/dl. An ACE inhibitor/angiotensin II receptor blocker is being taken.  Hypertension This is a chronic problem. The problem is controlled. Associated symptoms include chest pain. Pertinent negatives include no headaches, palpitations or shortness of breath. Past treatments include ACE inhibitors and beta blockers. The current treatment provides significant improvement. Hypertensive end-organ damage includes kidney disease. There is no history of CAD/MI or CVA.    Lab Results  Component Value Date   NA 137 12/27/2021   K 4.6  12/27/2021   CO2 24 12/27/2021   GLUCOSE 104 (H) 12/27/2021   BUN 15 12/27/2021   CREATININE 1.21 (H) 12/27/2021   CALCIUM 9.6 12/27/2021   EGFR 46 (L) 12/27/2021   GFRNONAA >60 04/23/2021   Lab Results  Component Value Date   CHOL 179 12/27/2021   HDL 83 12/27/2021   LDLCALC 82 12/27/2021   TRIG 75 12/27/2021   CHOLHDL 2.2 12/27/2021   Lab Results  Component Value Date   TSH 1.660 12/27/2021   Lab Results  Component Value Date   HGBA1C 9.4 (H) 12/27/2021   Lab Results  Component Value Date   WBC 7.4 12/27/2021   HGB 13.4 12/27/2021   HCT 41.0 12/27/2021   MCV 91 12/27/2021   PLT 215 12/27/2021   Lab Results  Component Value Date   ALT 10 12/27/2021   AST 15 12/27/2021   ALKPHOS 143 (H) 12/27/2021   BILITOT 0.3 12/27/2021   Lab Results  Component Value Date   VD25OH 35.3 12/25/2020     Review of Systems  Constitutional:  Negative for fatigue and unexpected weight change.  HENT:  Negative for nosebleeds.   Eyes:  Negative for visual disturbance.  Respiratory:  Negative for cough, chest tightness, shortness of breath and wheezing.   Cardiovascular:  Positive for chest pain. Negative for palpitations and leg swelling.  Gastrointestinal:  Negative for abdominal pain, constipation and diarrhea.  Musculoskeletal:  Positive for arthralgias (right shoulder blade and right elbow), back pain (due to rib fractures) and gait problem.  Neurological:  Negative for dizziness, weakness, light-headedness and headaches.  Psychiatric/Behavioral:  Positive for sleep disturbance. Negative for dysphoric mood. The patient is not nervous/anxious.  Patient Active Problem List   Diagnosis Date Noted   Trigger finger, left ring finger 12/27/2021   Primary osteoarthritis of both hips 06/10/2019   Bilateral carotid artery stenosis 08/24/2018   Atherosclerosis of aorta ([place]) 08/24/2018   Type II diabetes mellitus with complication ([place]) 95/18/8416   Bradycardia 07/24/2018    Neoplasm of uncertain behavior of skin 01/16/2017   Senile ecchymosis 08/20/2015   Essential (primary) hypertension 05/08/2015   History of rectal cancer 05/08/2015   Hyperlipidemia associated with type 2 diabetes mellitus ([place]) 05/08/2015   Seizure disorder ([place]) 05/08/2015   Shoulder strain 05/08/2015   Avitaminosis D 05/08/2015    Allergies  Allergen Reactions   Baclofen Other (See Comments)    Hallucinations   Pioglitazone Nausea Only    Past Surgical History:  Procedure Laterality Date   CATARACT EXTRACTION, BILATERAL  01/2020   Dr. Lucianne Lei [facility]   COLOSTOMY REVERSAL  01/2014   ILEOSTOMY  04/629   UMBILICAL HERNIA REPAIR  05/2018    Social History   Tobacco Use   Smoking status: Never   Smokeless tobacco: Never  Vaping Use   Vaping Use: Never used  Substance Use Topics   Alcohol use: No    Alcohol/week: 0.0 standard drinks of alcohol   Drug use: No     Medication list has been reviewed and updated.  Current Meds  Medication Sig   aspirin 81 MG chewable tablet Chew 81 mg by mouth daily.    atorvastatin (LIPITOR) 40 MG tablet TAKE 1 TABLET BY MOUTH DAILY   Bacillus Coagulans-Inulin (PROBIOTIC) 1-250 BILLION-MG CAPS Take 1 capsule by mouth daily.   calcium carbonate (OSCAL) 1500 (600 Ca) MG TABS tablet Take 600 mg of elemental calcium by mouth daily.   carbamazepine (TEGRETOL) 200 MG tablet TAKE 1 TABLET(200 MG) BY MOUTH DAILY   Cholecalciferol 25 MCG (1000 UT) capsule Take 1,000 Units by mouth daily.    docusate sodium (COLACE) 250 MG capsule Take 250 mg by mouth daily.   Fluocinolone Acetonide 0.01 % OIL Place 1 drop into both ears at bedtime. Alternating ears each night   JARDIANCE 25 MG TABS tablet TAKE 1 TABLET(25 MG) BY MOUTH DAILY BEFORE BREAKFAST   LANTUS SOLOSTAR 100 UNIT/ML Solostar Pen INJECT 20 TO 25 UNITS UNDER THE SKIN DAILY (Patient taking differently: 30 Units daily.)   lidocaine (LIDODERM) 5 % Place 1 patch onto the skin every 12 (twelve) hours.  Remove & Discard patch within 12 hours or as directed by MD   nitroGLYCERIN (NITROSTAT) 0.4 MG SL tablet Place 1 tablet (0.4 mg total) under the tongue every 5 (five) minutes x 3 doses as needed for chest pain.   pantoprazole (PROTONIX) 40 MG tablet Take 1 tablet (40 mg total) by mouth daily as needed (heartburn).   Propylene Glycol (SYSTANE BALANCE OP) Place 1 drop into both eyes 2 (two) times daily as needed (dry eyes).   trolamine salicylate (ASPERCREME) 10 % cream Apply 1 application topically as needed for muscle pain.   [DISCONTINUED] HYDROcodone-acetaminophen (NORCO/VICODIN) 5-325 MG tablet Take 1-2 tablets by mouth every 6 (six) hours as needed for moderate pain.   [DISCONTINUED] metoprolol succinate (TOPROL-XL) 25 MG 24 hr tablet TAKE 1/2 TABLET(12.5 MG) BY MOUTH IN THE MORNING AND AT BEDTIME   [DISCONTINUED] ONETOUCH ULTRA test strip USE 1 STRIP TO CHECK GLUCOSE TWICE DAILY   [DISCONTINUED] oxyCODONE (ROXICODONE) 5 MG immediate release tablet Take 1 tablet (5 mg total) by mouth every 8 (eight) hours as needed.   [  DISCONTINUED] ramipril (ALTACE) 10 MG capsule TAKE 1 CAPSULE(10 MG) BY MOUTH DAILY       07/04/2022    3:56 PM 12/27/2021   10:53 AM 10/01/2021    1:32 PM 08/01/2021   10:49 AM  GAD 7 : Generalized Anxiety Score  Nervous, Anxious, on Edge _0 Control/stop worrying 1 0 1 0  Worry too much - different things _1 0  Trouble relaxing 1 0 1 1  Restless 0 0 0 0  Easily annoyed or irritable 0 0 0 0  Afraid - awful might happen 1 0 1 1  Total GAD 7 Score _2 Anxiety Difficulty Not difficult at all  Not difficult at all        07/04/2022    3:56 PM 07/04/2022    3:48 PM 12/27/2021   10:53 AM  Depression screen PHQ 2/9  Decreased Interest 1 0 0  Down, Depressed, Hopeless 0  0  PHQ - 2 Score 1 0 0  Altered sleeping 2  1  Tired, decreased energy 1  1  Change in appetite 0  0  Feeling bad or failure about yourself  0  0  Trouble concentrating 0  0  Moving  slowly or fidgety/restless 0  0  Suicidal thoughts 0  0  PHQ-9 Score 4  2  Difficult doing work/chores Not difficult at all  Not difficult at all    BP Readings from Last 3 Encounters:  07/04/22 136/86  06/20/22 (!) 162/60  12/27/21 (!) 128/58    Physical Exam Vitals and nursing note reviewed.  Constitutional:      General: She is not in acute distress.    Appearance: She is well-developed. She is not ill-appearing.  HENT:     Head: Normocephalic and atraumatic.  Cardiovascular:     Rate and Rhythm: Normal rate and regular rhythm.  Pulmonary:     Effort: Pulmonary effort is normal. No respiratory distress.     Breath sounds: No wheezing or rhonchi.  Chest:     Comments: Bruise right posterior mid ribs Musculoskeletal:     Right shoulder: No tenderness or bony tenderness. Normal range of motion.     Right elbow: Normal.     Cervical back: Normal range of motion.  Lymphadenopathy:     Cervical: No cervical adenopathy.  Skin:    General: Skin is warm and dry.     Findings: No rash.  Neurological:     Mental Status: She is alert and oriented to person, place, and time.  Psychiatric:        Mood and Affect: Mood normal.        Behavior: Behavior normal.     Wt Readings from Last 3 Encounters:  07/04/22 152 lb (68.9 kg)  06/19/22 153 lb (69.4 kg)  12/27/21 153 lb (69.4 kg)    BP 136/86 (BP Location: Left Arm, Cuff Size: Normal)   Pulse 66   Ht _3  (1.575 m)   Wt 152 lb (68.9 kg)   SpO2 96%   BMI 27.80 kg/m   Assessment and Plan: 1. Multiple rib fractures involving four or more ribs Right posterior ribs 5-8 Gradually improving with less pain but still taking oxycodone at night Expect continued slow healing - no further imaging is indicated at this time - she has good oxygen sats and normal lung exam Will give a few more oxycodone Will discuss DEXA at follow up visit -  oxyCODONE (ROXICODONE) 5 MG immediate release tablet; Take 1 tablet (5 mg total) by mouth  every 8 (eight) hours as needed.  Dispense: 20 tablet; Refill: 0  2. Type II diabetes mellitus with complication (HCC) Continue current medications and higher dose of insulin Check labs and advise - Hemoglobin G5Q - Basic metabolic panel  3. Essential (primary) hypertension Clinically stable exam with well controlled BP. Tolerating medications without side effects at this time. Pt to continue current regimen and low sodium diet; benefits of regular exercise as able discussed. - ramipril (ALTACE) 10 MG capsule; Take 1 capsule (10 mg total) by mouth daily.  Dispense: 90 capsule; Refill: 1   Partially dictated using Editor, commissioning. Any errors are unintentional.  Halina Maidens, MD [facility]  07/04/2022

## 2022-07-05 LAB — BASIC METABOLIC PANEL
BUN/Creatinine Ratio: 13 (ref 12–28)
BUN: 14 mg/dL (ref 8–27)
CO2: 24 mmol/L (ref 20–29)
Calcium: 9.4 mg/dL (ref 8.7–10.3)
Chloride: 100 mmol/L (ref 96–106)
Creatinine, Ser: 1.11 mg/dL — ABNORMAL HIGH (ref 0.57–1.00)
Glucose: 134 mg/dL — ABNORMAL HIGH (ref 70–99)
Potassium: 4.4 mmol/L (ref 3.5–5.2)
Sodium: 138 mmol/L (ref 134–144)
eGFR: 52 mL/min/{1.73_m2} — ABNORMAL LOW (ref >59)

## 2022-07-05 LAB — HEMOGLOBIN A1C
Est. average glucose Bld gHb Est-mCnc: 186 mg/dL
Hgb A1c MFr Bld: 8.1 % — ABNORMAL HIGH (ref 4.8–5.6)

## 2022-07-07 ENCOUNTER — Other Ambulatory Visit: Payer: Self-pay | Admitting: Internal Medicine

## 2022-07-07 DIAGNOSIS — E118 Type 2 diabetes mellitus with unspecified complications: Secondary | ICD-10-CM

## 2022-07-08 ENCOUNTER — Other Ambulatory Visit: Payer: Self-pay | Admitting: Internal Medicine

## 2022-07-08 DIAGNOSIS — E118 Type 2 diabetes mellitus with unspecified complications: Secondary | ICD-10-CM

## 2022-07-09 NOTE — Telephone Encounter (Signed)
Requested Prescriptions  Pending Prescriptions Disp Refills  . LANTUS SOLOSTAR 100 UNIT/ML Solostar Pen [Pharmacy Med Name: LANTUS SOLOSTAR PEN INJ 3ML] 30 mL 0    Sig: INJECT 20 TO 25 UNITS UNDER THE SKIN DAILY     Endocrinology:  Diabetes - Insulins Failed - 07/08/2022 11:10 AM      Failed - HBA1C is between 0 and 7.9 and within 180 days    Hgb A1c MFr Bld  Date Value Ref Range Status  07/04/2022 8.1 (H) 4.8 - 5.6 % Final    Comment:             Prediabetes: 5.7 - 6.4          Diabetes: >6.4          Glycemic control for adults with diabetes: <7.0          Passed - Valid encounter within last 6 months    Recent Outpatient Visits          5 days ago Multiple rib fractures involving four or more ribs   [place] [facility] at [facility], Jesse Sans, MD   6 months ago Annual physical exam   [place] [facility] at [facility], Jesse Sans, MD   9 months ago Essential (primary) hypertension   [place] [facility] at [facility], Jesse Sans, MD   11 months ago Type II diabetes mellitus with complication [facility])   [place] [facility] at [facility], Jesse Sans, MD   1 year ago Type II diabetes mellitus with complication [facility])    Primary Care and Sports Medicine at [facility], Jesse Sans, MD      Future Appointments            In 2 months Army Melia, Jesse Sans, MD [facility] at [facility], [facility]   In 5 months Army Melia, Jesse Sans, MD [place] [facility] at [facility], [facility]

## 2022-07-23 ENCOUNTER — Telehealth: Payer: Self-pay

## 2022-07-23 NOTE — Progress Notes (Signed)
[facility] [facility]) [place]   07/23/2022  [name] June 10, 1946 [id number]  Reason for referral: Medication Assistance - Jardiance  Referral source: [facility] Referral medication(s): Jardiance   Medication Assistance Findings:  Medication assistance needs identified: Patient reported financial barriers for Jardiance. She qualifies for Medicare Extra-Help, but the patient declined to complete the LIS application. Based on her reported income, the Jardiance PAP will require LIS denial prior to PAP approval. I informed the patient that this declination would exclude her from alternative medication assistance programs for Jardiance. The patient verbalized understanding.   Plan: [facility] pharmacy case is being closed due to the following reasons:  The patient declined medication assistance services at this time.    [facility] is happy to assist the patient/family in the future for clinical pharmacy needs, following a discussion from your team about [facility] outreach. Thank you for allowing [facility] to be a part of your patient's care.    Thanks,  Reed Breech, [facility] [phone]

## 2022-07-29 DIAGNOSIS — H6063 Unspecified chronic otitis externa, bilateral: Secondary | ICD-10-CM | POA: Diagnosis not present

## 2022-07-29 DIAGNOSIS — H6123 Impacted cerumen, bilateral: Secondary | ICD-10-CM | POA: Diagnosis not present

## 2022-08-13 DIAGNOSIS — B351 Tinea unguium: Secondary | ICD-10-CM | POA: Diagnosis not present

## 2022-08-13 DIAGNOSIS — E119 Type 2 diabetes mellitus without complications: Secondary | ICD-10-CM | POA: Diagnosis not present

## 2022-08-15 ENCOUNTER — Other Ambulatory Visit: Payer: Self-pay | Admitting: Internal Medicine

## 2022-08-15 NOTE — Telephone Encounter (Signed)
Requested Prescriptions  Pending Prescriptions Disp Refills  . ONETOUCH ULTRA test strip [employer] Med Name: OneTouch Ultra Blue In Vitro Strip] 100 each 0    Sig: USE 1 STRIP TO [place] DAILY     Endocrinology: Diabetes - Testing Supplies Passed - 08/15/2022  9:21 AM      Passed - Valid encounter within last 12 months    Recent Outpatient Visits          1 month ago Multiple rib fractures involving four or more ribs   [place] [facility] at [facility], Jesse Sans, MD   7 months ago Annual physical exam   [place] [facility] at [facility], Jesse Sans, MD   10 months ago Essential (primary) hypertension   [place] [facility] at [facility], Jesse Sans, MD   1 year ago Type II diabetes mellitus with complication [facility])   [place] [facility] at [facility], Jesse Sans, MD   1 year ago Type II diabetes mellitus with complication [facility])    Primary Care and Sports Medicine at [facility], Jesse Sans, MD      Future Appointments            In 1 month Army Melia, Jesse Sans, MD [facility] and [facility] at [facility], [facility]   In 4 months Army Melia, Jesse Sans, MD [place] [facility] at [facility], [facility]

## 2022-08-19 ENCOUNTER — Other Ambulatory Visit: Payer: Self-pay

## 2022-08-19 MED ORDER — ONETOUCH ULTRA VI STRP: ORAL_STRIP | 2 refills | Status: DC

## 2022-09-10 ENCOUNTER — Ambulatory Visit: Payer: Self-pay | Admitting: *Deleted

## 2022-09-10 NOTE — Telephone Encounter (Signed)
  Chief Complaint: cough- chest congestion  Symptoms: cough, chest congestion  Frequency: started Saturday Pertinent Negatives: Patient denies SOB Disposition: '[]'$ ED /'[]'$ Urgent Care (no appt availability in office) / '[x]'$ Appointment(In office/virtual)/ '[]'$  [place] [facility]/ '[]'$ Home Care/ '[]'$ Refused Recommended Disposition /'[]'$  Mobile Bus/ '[]'$  Follow-up with PCP Additional Notes:

## 2022-09-10 NOTE — Telephone Encounter (Signed)
Noted  KP 

## 2022-09-10 NOTE — Telephone Encounter (Signed)
Summary: cough   Pt called in for assistance. Pt says that she has a uncontrollable cough and would like some suggestions on what she could take OTC. Please assist pt further.      Reason for Disposition  SEVERE coughing spells (e.g., whooping sound after coughing, vomiting after coughing)  Answer Assessment - Initial Assessment Questions 1. ONSET: "When did the cough begin?"      Saturday 2. SEVERITY: "How bad is the cough today?"      Patient has spells 3. SPUTUM: "Describe the color of your sputum" (none, dry cough; clear, white, yellow, green)     yellow 4. HEMOPTYSIS: "Are you coughing up any blood?" If so ask: "How much?" (flecks, streaks, tablespoons, etc.)     no 5. DIFFICULTY BREATHING: "Are you having difficulty breathing?" If Yes, ask: "How bad is it?" (e.g., mild, moderate, severe)    - MILD: No SOB at rest, mild SOB with walking, speaks normally in sentences, can lie down, no retractions, pulse < 100.    - MODERATE: SOB at rest, SOB with minimal exertion and prefers to sit, cannot lie down flat, speaks in phrases, mild retractions, audible wheezing, pulse 100-120.    - SEVERE: Very SOB at rest, speaks in single words, struggling to breathe, sitting hunched forward, retractions, pulse > 120      Normal- fatigue 6. FEVER: "Do you have a fever?" If Yes, ask: "What is your temperature, how was it measured, and when did it start?"     Low grade Saturday 7. CARDIAC HISTORY: "Do you have any history of heart disease?" (e.g., heart attack, congestive heart failure)      no 8. LUNG HISTORY: "Do you have any history of lung disease?"  (e.g., pulmonary embolus, asthma, emphysema)     no 9. PE RISK FACTORS: "Do you have a history of blood clots?" (or: recent major surgery, recent prolonged travel, bedridden)     no 10. OTHER SYMPTOMS: "Do you have any other symptoms?" (e.g., runny nose, wheezing, chest pain)       wheezing  Protocols used: Cough - Acute Productive-A-AH

## 2022-09-11 ENCOUNTER — Ambulatory Visit (INDEPENDENT_AMBULATORY_CARE_PROVIDER_SITE_OTHER): Payer: Medicare Other | Admitting: Internal Medicine

## 2022-09-11 ENCOUNTER — Encounter: Payer: Self-pay | Admitting: Internal Medicine

## 2022-09-11 VITALS — BP 150/80 | HR 64 | Temp 98.2°F | Ht 62.0 in | Wt 152.2 lb

## 2022-09-11 DIAGNOSIS — J40 Bronchitis, not specified as acute or chronic: Secondary | ICD-10-CM

## 2022-09-11 DIAGNOSIS — R051 Acute cough: Secondary | ICD-10-CM

## 2022-09-11 LAB — POC COVID19 BINAXNOW: SARS Coronavirus 2 Ag: NEGATIVE

## 2022-09-11 MED ORDER — AZITHROMYCIN 250 MG PO TABS: ORAL_TABLET | ORAL | 0 refills | Status: AC

## 2022-09-11 MED ORDER — PROMETHAZINE-DM 6.25-15 MG/5ML PO SYRP: 5.0000 mL | ORAL_SOLUTION | Freq: Four times a day (QID) | ORAL | 0 refills | Status: AC | PRN

## 2022-09-11 NOTE — Progress Notes (Signed)
Date:  09/11/2022   Name:  [name]   DOB:  Aug 28, 1946   MRN:  [id number]   Chief Complaint: Cough  Cough This is a new problem. The current episode started in the past 7 days. The problem has been waxing and waning. The cough is Productive of sputum. Associated symptoms include chills, ear congestion, a fever, postnasal drip, rhinorrhea, a sore throat, shortness of breath and wheezing. Pertinent negatives include no chest pain, headaches, myalgias or nasal congestion.    Lab Results  Component Value Date   NA 138 07/04/2022   K 4.4 07/04/2022   CO2 24 07/04/2022   GLUCOSE 134 (H) 07/04/2022   BUN 14 07/04/2022   CREATININE 1.11 (H) 07/04/2022   CALCIUM 9.4 07/04/2022   EGFR 52 (L) 07/04/2022   GFRNONAA >60 04/23/2021   Lab Results  Component Value Date   CHOL 179 12/27/2021   HDL 83 12/27/2021   LDLCALC 82 12/27/2021   TRIG 75 12/27/2021   CHOLHDL 2.2 12/27/2021   Lab Results  Component Value Date   TSH 1.660 12/27/2021   Lab Results  Component Value Date   HGBA1C 8.1 (H) 07/04/2022   Lab Results  Component Value Date   WBC 7.4 12/27/2021   HGB 13.4 12/27/2021   HCT 41.0 12/27/2021   MCV 91 12/27/2021   PLT 215 12/27/2021   Lab Results  Component Value Date   ALT 10 12/27/2021   AST 15 12/27/2021   ALKPHOS 143 (H) 12/27/2021   BILITOT 0.3 12/27/2021   Lab Results  Component Value Date   VD25OH 35.3 12/25/2020     Review of Systems  Constitutional:  Positive for chills and fever.  HENT:  Positive for congestion, postnasal drip, rhinorrhea and sore throat.   Respiratory:  Positive for cough, shortness of breath and wheezing.   Cardiovascular:  Negative for chest pain and palpitations.  Gastrointestinal:  Negative for diarrhea, nausea and vomiting.  Musculoskeletal:  Negative for myalgias.  Neurological:  Negative for dizziness, light-headedness and headaches.  Psychiatric/Behavioral:  Positive for sleep disturbance (due to cough).      Patient Active Problem List   Diagnosis Date Noted   Trigger finger, left ring finger 12/27/2021   Primary osteoarthritis of both hips 06/10/2019   Bilateral carotid artery stenosis 08/24/2018   Atherosclerosis of aorta ([place]) 08/24/2018   Type II diabetes mellitus with complication ([place]) 74/94/4967   Bradycardia 07/24/2018   Neoplasm of uncertain behavior of skin 01/16/2017   Senile ecchymosis 08/20/2015   Essential (primary) hypertension 05/08/2015   History of rectal cancer 05/08/2015   Hyperlipidemia associated with type 2 diabetes mellitus ([place]) 05/08/2015   Seizure disorder ([place]) 05/08/2015   Shoulder strain 05/08/2015   Avitaminosis D 05/08/2015    Allergies  Allergen Reactions   Baclofen Other (See Comments)    Hallucinations   Pioglitazone Nausea Only    Past Surgical History:  Procedure Laterality Date   CATARACT EXTRACTION, BILATERAL  01/2020   Dr. Lucianne Lei [facility]   COLOSTOMY REVERSAL  01/2014   ILEOSTOMY  03/9162   UMBILICAL HERNIA REPAIR  05/2018    Social History   Tobacco Use   Smoking status: Never   Smokeless tobacco: Never  Vaping Use   Vaping Use: Never used  Substance Use Topics   Alcohol use: No    Alcohol/week: 0.0 standard drinks of alcohol   Drug use: No     Medication list has been reviewed and updated.  Current Meds  Medication Sig   aspirin 81 MG chewable tablet Chew 81 mg by mouth daily.    atorvastatin (LIPITOR) 40 MG tablet TAKE 1 TABLET BY MOUTH DAILY   azithromycin (ZITHROMAX Z-PAK) 250 MG tablet UAD   Bacillus Coagulans-Inulin (PROBIOTIC) 1-250 BILLION-MG CAPS Take 1 capsule by mouth daily.   calcium carbonate (OSCAL) 1500 (600 Ca) MG TABS tablet Take 600 mg of elemental calcium by mouth daily.   carbamazepine (TEGRETOL) 200 MG tablet TAKE 1 TABLET(200 MG) BY MOUTH DAILY   Cholecalciferol 25 MCG (1000 UT) capsule Take 1,000 Units by mouth daily.    docusate sodium (COLACE) 250 MG capsule Take 250 mg by mouth daily.    Fluocinolone Acetonide 0.01 % OIL Place 1 drop into both ears at bedtime. Alternating ears each night   glucose blood (ONETOUCH ULTRA) test strip Use to test Blood Sugar 1-2 times daily.   JARDIANCE 25 MG TABS tablet TAKE 1 TABLET(25 MG) BY MOUTH DAILY BEFORE BREAKFAST   LANTUS SOLOSTAR 100 UNIT/ML Solostar Pen INJECT 20 TO 25 UNITS UNDER THE SKIN DAILY   lidocaine (LIDODERM) 5 % Place 1 patch onto the skin every 12 (twelve) hours. Remove & Discard patch within 12 hours or as directed by MD   metoprolol succinate (TOPROL-XL) 25 MG 24 hr tablet Take 0.5 tablets (12.5 mg total) by mouth 2 (two) times daily.   nitroGLYCERIN (NITROSTAT) 0.4 MG SL tablet Place 1 tablet (0.4 mg total) under the tongue every 5 (five) minutes x 3 doses as needed for chest pain.   oxyCODONE (ROXICODONE) 5 MG immediate release tablet Take 1 tablet (5 mg total) by mouth every 8 (eight) hours as needed.   promethazine-dextromethorphan (PROMETHAZINE-DM) 6.25-15 MG/5ML syrup Take 5 mLs by mouth 4 (four) times daily as needed for up to 9 days for cough.   Propylene Glycol (SYSTANE BALANCE OP) Place 1 drop into both eyes 2 (two) times daily as needed (dry eyes).   ramipril (ALTACE) 10 MG capsule Take 1 capsule (10 mg total) by mouth daily.   trolamine salicylate (ASPERCREME) 10 % cream Apply 1 application topically as needed for muscle pain.       09/11/2022   10:14 AM 07/04/2022    3:56 PM 12/27/2021   10:53 AM 10/01/2021    1:32 PM  GAD 7 : Generalized Anxiety Score  Nervous, Anxious, on Edge 0 _0 Control/stop worrying 0 1 0 1  Worry too much - different things _1 Trouble relaxing 0 1 0 1  Restless 0 0 0 0  Easily annoyed or irritable 1 0 0 0  Afraid - awful might happen 0 1 0 1  Total GAD 7 Score _2 Anxiety Difficulty Not difficult at all Not difficult at all  Not difficult at all       09/11/2022   10:14 AM 07/04/2022    3:56 PM 07/04/2022    3:48 PM  Depression screen PHQ 2/9  Decreased  Interest 0 1 0  Down, Depressed, Hopeless 0 0   PHQ - 2 Score 0 1 0  Altered sleeping 1 2   Tired, decreased energy 1 1   Change in appetite 1 0   Feeling bad or failure about yourself  0 0   Trouble concentrating 0 0   Moving slowly or fidgety/restless 0 0   Suicidal thoughts 0 0   PHQ-9 Score 3 4   Difficult doing work/chores Not difficult at all Not  difficult at all     BP Readings from Last 3 Encounters:  09/11/22 (!) 150/80  07/04/22 136/86  06/20/22 (!) 162/60    Physical Exam Vitals and nursing note reviewed.  Constitutional:      General: She is not in acute distress.    Appearance: She is well-developed.  HENT:     Head: Normocephalic and atraumatic.  Cardiovascular:     Rate and Rhythm: Normal rate and regular rhythm.  Pulmonary:     Effort: Pulmonary effort is normal. No respiratory distress.     Breath sounds: Examination of the right-lower field reveals decreased breath sounds. Decreased breath sounds present. No wheezing or rhonchi.  Musculoskeletal:     Cervical back: Normal range of motion.  Lymphadenopathy:     Cervical: No cervical adenopathy.  Skin:    General: Skin is warm and dry.     Findings: No rash.  Neurological:     Mental Status: She is alert and oriented to person, place, and time.  Psychiatric:        Mood and Affect: Mood normal.        Behavior: Behavior normal.     Wt Readings from Last 3 Encounters:  09/11/22 152 lb 3.2 oz (69 kg)  07/04/22 152 lb (68.9 kg)  06/19/22 153 lb (69.4 kg)    BP (!) 150/80 (BP Location: Left Arm, Patient Position: Sitting, Cuff Size: Normal)   Pulse 64   Temp 98.2 F (36.8 C) (Oral)   Ht _0  (1.575 m)   Wt 152 lb 3.2 oz (69 kg)   SpO2 97%   BMI 27.84 kg/m   Assessment and Plan: 1. Acute cough Due to bronchitis - POC COVID-19 BinaxNow - negative - promethazine-dextromethorphan (PROMETHAZINE-DM) 6.25-15 MG/5ML syrup; Take 5 mLs by mouth 4 (four) times daily as needed for up to 9 days for  cough.  Dispense: 118 mL; Refill: 0  2. Bronchitis Treat with Zpak, continue Dayquil during the day Rest, push fluids Follow up if worsening - azithromycin (ZITHROMAX Z-PAK) 250 MG tablet; UAD  Dispense: 6 each; Refill: 0   Partially dictated using Editor, commissioning. Any errors are unintentional.  Halina Maidens, MD [facility]  09/11/2022

## 2022-09-15 ENCOUNTER — Other Ambulatory Visit: Payer: Self-pay | Admitting: Internal Medicine

## 2022-09-15 DIAGNOSIS — E1169 Type 2 diabetes mellitus with other specified complication: Secondary | ICD-10-CM

## 2022-09-16 NOTE — Telephone Encounter (Signed)
Requested Prescriptions  Pending Prescriptions Disp Refills  . atorvastatin (LIPITOR) 40 MG tablet [Pharmacy Med Name: ATORVASTATIN '40MG'$  TABLETS] 90 tablet 1    Sig: TAKE 1 TABLET BY MOUTH DAILY     Cardiovascular:  Antilipid - Statins Failed - 09/15/2022  9:22 AM      Failed - Lipid Panel in normal range within the last 12 months    Cholesterol, Total  Date Value Ref Range Status  12/27/2021 179 100 - 199 mg/dL Final   LDL Chol Calc (NIH)  Date Value Ref Range Status  12/27/2021 82 0 - 99 mg/dL Final   HDL  Date Value Ref Range Status  12/27/2021 83 >39 mg/dL Final   Triglycerides  Date Value Ref Range Status  12/27/2021 75 0 - 149 mg/dL Final         Passed - Patient is not pregnant      Passed - Valid encounter within last 12 months    Recent Outpatient Visits          5 days ago Acute cough   [place] [facility] at [facility], Jesse Sans, MD   2 months ago Multiple rib fractures involving four or more ribs   [place] [facility] at [facility], Jesse Sans, MD   8 months ago Annual physical exam   [place] [facility] at [facility], Jesse Sans, MD   11 months ago Essential (primary) hypertension   [place] [facility] at [facility], Jesse Sans, MD   1 year ago Type II diabetes mellitus with complication [facility])   [place] [facility] at [facility], Jesse Sans, MD      Future Appointments            In 2 weeks Army Melia, Jesse Sans, MD [facility] at [facility], [facility]   In 3 months Army Melia, Jesse Sans, MD [place] [facility] at [facility], [facility]

## 2022-10-06 ENCOUNTER — Encounter: Payer: Self-pay | Admitting: Internal Medicine

## 2022-10-06 ENCOUNTER — Ambulatory Visit (INDEPENDENT_AMBULATORY_CARE_PROVIDER_SITE_OTHER): Payer: Medicare Other | Admitting: Internal Medicine

## 2022-10-06 VITALS — BP 128/70 | HR 76 | Ht 62.0 in | Wt 152.4 lb

## 2022-10-06 DIAGNOSIS — Z23 Encounter for immunization: Secondary | ICD-10-CM

## 2022-10-06 DIAGNOSIS — I10 Essential (primary) hypertension: Secondary | ICD-10-CM | POA: Diagnosis not present

## 2022-10-06 DIAGNOSIS — E2839 Other primary ovarian failure: Secondary | ICD-10-CM | POA: Diagnosis not present

## 2022-10-06 DIAGNOSIS — E118 Type 2 diabetes mellitus with unspecified complications: Secondary | ICD-10-CM

## 2022-10-06 LAB — POCT GLYCOSYLATED HEMOGLOBIN (HGB A1C): Hemoglobin A1C: 7.5 % — AB (ref 4.0–5.6)

## 2022-10-06 NOTE — Patient Instructions (Signed)
Call [facility] to schedule your Bone Density in [place] at [phone].

## 2022-10-06 NOTE — Progress Notes (Signed)
Date:  10/06/2022   Name:  [name]   DOB:  1946-07-16   MRN:  [id number]   Chief Complaint: Diabetes (Stopped taking Jardiance since 2 months ago due to Vaginal itching. Itching is now gone. ) and Hypertension  Diabetes She presents for her follow-up diabetic visit. She has type 2 diabetes mellitus. Her disease course has been improving. Pertinent negatives for hypoglycemia include no headaches or tremors. There are no diabetic associated symptoms. Pertinent negatives for diabetes include no chest pain, no fatigue, no polydipsia and no polyuria.  Hypertension This is a recurrent problem. The current episode started more than 1 year ago. The problem has been waxing and waning since onset. The problem is controlled. Pertinent negatives include no chest pain, headaches, palpitations or shortness of breath. Risk factors for coronary artery disease include obesity and diabetes mellitus.    Lab Results  Component Value Date   NA 138 07/04/2022   K 4.4 07/04/2022   CO2 24 07/04/2022   GLUCOSE 134 (H) 07/04/2022   BUN 14 07/04/2022   CREATININE 1.11 (H) 07/04/2022   CALCIUM 9.4 07/04/2022   EGFR 52 (L) 07/04/2022   GFRNONAA >60 04/23/2021   Lab Results  Component Value Date   CHOL 179 12/27/2021   HDL 83 12/27/2021   LDLCALC 82 12/27/2021   TRIG 75 12/27/2021   CHOLHDL 2.2 12/27/2021   Lab Results  Component Value Date   TSH 1.660 12/27/2021   Lab Results  Component Value Date   HGBA1C 8.1 (H) 07/04/2022   Lab Results  Component Value Date   WBC 7.4 12/27/2021   HGB 13.4 12/27/2021   HCT 41.0 12/27/2021   MCV 91 12/27/2021   PLT 215 12/27/2021   Lab Results  Component Value Date   ALT 10 12/27/2021   AST 15 12/27/2021   ALKPHOS 143 (H) 12/27/2021   BILITOT 0.3 12/27/2021   Lab Results  Component Value Date   VD25OH 35.3 12/25/2020     Review of Systems  Constitutional:  Negative for appetite change, fatigue, fever and unexpected weight change.   HENT:  Negative for tinnitus and trouble swallowing.   Eyes:  Negative for visual disturbance.  Respiratory:  Negative for cough, chest tightness and shortness of breath.   Cardiovascular:  Negative for chest pain, palpitations and leg swelling.  Gastrointestinal:  Negative for abdominal pain.  Endocrine: Negative for polydipsia and polyuria.  Genitourinary:  Negative for dysuria and hematuria.  Musculoskeletal:  Negative for arthralgias.  Neurological:  Negative for tremors, numbness and headaches.  Psychiatric/Behavioral:  Negative for dysphoric mood.     Patient Active Problem List   Diagnosis Date Noted   Trigger finger, left ring finger 12/27/2021   Primary osteoarthritis of both hips 06/10/2019   Bilateral carotid artery stenosis 08/24/2018   Atherosclerosis of aorta ([place]) 08/24/2018   Type II diabetes mellitus with complication ([place]) 40/98/1191   Bradycardia 07/24/2018   Neoplasm of uncertain behavior of skin 01/16/2017   Senile ecchymosis 08/20/2015   Essential (primary) hypertension 05/08/2015   History of rectal cancer 05/08/2015   Hyperlipidemia associated with type 2 diabetes mellitus ([place]) 05/08/2015   Seizure disorder ([place]) 05/08/2015   Shoulder strain 05/08/2015   Avitaminosis D 05/08/2015    Allergies  Allergen Reactions   Baclofen Other (See Comments)    Hallucinations   Pioglitazone Nausea Only    Past Surgical History:  Procedure Laterality Date   CATARACT EXTRACTION, BILATERAL  01/2020   Dr. Lucianne Lei  [facility]   COLOSTOMY REVERSAL  01/2014   ILEOSTOMY  0/2409   UMBILICAL HERNIA REPAIR  05/2018    Social History   Tobacco Use   Smoking status: Never   Smokeless tobacco: Never  Vaping Use   Vaping Use: Never used  Substance Use Topics   Alcohol use: No    Alcohol/week: 0.0 standard drinks of alcohol   Drug use: No     Medication list has been reviewed and updated.  Current Meds  Medication Sig   aspirin 81 MG chewable tablet Chew 81 mg by mouth  daily.    atorvastatin (LIPITOR) 40 MG tablet TAKE 1 TABLET BY MOUTH DAILY   Bacillus Coagulans-Inulin (PROBIOTIC) 1-250 BILLION-MG CAPS Take 1 capsule by mouth daily.   calcium carbonate (OSCAL) 1500 (600 Ca) MG TABS tablet Take 600 mg of elemental calcium by mouth daily.   carbamazepine (TEGRETOL) 200 MG tablet TAKE 1 TABLET(200 MG) BY MOUTH DAILY   Cholecalciferol 25 MCG (1000 UT) capsule Take 1,000 Units by mouth daily.    docusate sodium (COLACE) 250 MG capsule Take 250 mg by mouth daily.   Fluocinolone Acetonide 0.01 % OIL Place 1 drop into both ears at bedtime. Alternating ears each night   glucose blood (ONETOUCH ULTRA) test strip Use to test Blood Sugar 1-2 times daily.   LANTUS SOLOSTAR 100 UNIT/ML Solostar Pen INJECT 20 TO 25 UNITS UNDER THE SKIN DAILY   lidocaine (LIDODERM) 5 % Place 1 patch onto the skin every 12 (twelve) hours. Remove & Discard patch within 12 hours or as directed by MD   metoprolol succinate (TOPROL-XL) 25 MG 24 hr tablet Take 0.5 tablets (12.5 mg total) by mouth 2 (two) times daily.   nitroGLYCERIN (NITROSTAT) 0.4 MG SL tablet Place 1 tablet (0.4 mg total) under the tongue every 5 (five) minutes x 3 doses as needed for chest pain.   oxyCODONE (ROXICODONE) 5 MG immediate release tablet Take 1 tablet (5 mg total) by mouth every 8 (eight) hours as needed.   Propylene Glycol (SYSTANE BALANCE OP) Place 1 drop into both eyes 2 (two) times daily as needed (dry eyes).   ramipril (ALTACE) 10 MG capsule Take 1 capsule (10 mg total) by mouth daily.   trolamine salicylate (ASPERCREME) 10 % cream Apply 1 application topically as needed for muscle pain.       09/11/2022   10:14 AM 07/04/2022    3:56 PM 12/27/2021   10:53 AM 10/01/2021    1:32 PM  GAD 7 : Generalized Anxiety Score  Nervous, Anxious, on Edge 0 _0 Control/stop worrying 0 1 0 1  Worry too much - different things _1 Trouble relaxing 0 1 0 1  Restless 0 0 0 0  Easily annoyed or irritable 1 0 0 0   Afraid - awful might happen 0 1 0 1  Total GAD 7 Score _2 Anxiety Difficulty Not difficult at all Not difficult at all  Not difficult at all       09/11/2022   10:14 AM 07/04/2022    3:56 PM 07/04/2022    3:48 PM  Depression screen PHQ 2/9  Decreased Interest 0 1 0  Down, Depressed, Hopeless 0 0   PHQ - 2 Score 0 1 0  Altered sleeping 1 2   Tired, decreased energy 1 1   Change in appetite 1 0   Feeling bad or failure about yourself  0 0  Trouble concentrating 0 0   Moving slowly or fidgety/restless 0 0   Suicidal thoughts 0 0   PHQ-9 Score 3 4   Difficult doing work/chores Not difficult at all Not difficult at all     BP Readings from Last 3 Encounters:  10/06/22 128/70  09/11/22 (!) 150/80  07/04/22 136/86    Physical Exam Vitals and nursing note reviewed.  Constitutional:      General: She is not in acute distress.    Appearance: She is well-developed.  HENT:     Head: Normocephalic and atraumatic.  Pulmonary:     Effort: Pulmonary effort is normal. No respiratory distress.  Skin:    General: Skin is warm and dry.     Findings: No rash.  Neurological:     Mental Status: She is alert and oriented to person, place, and time.  Psychiatric:        Mood and Affect: Mood normal.        Behavior: Behavior normal.     Wt Readings from Last 3 Encounters:  10/06/22 152 lb 6.4 oz (69.1 kg)  09/11/22 152 lb 3.2 oz (69 kg)  07/04/22 152 lb (68.9 kg)    BP 128/70   Pulse 76   Ht _0  (1.575 m)   Wt 152 lb 6.4 oz (69.1 kg)   SpO2 97%   BMI 27.87 kg/m   Assessment and Plan: 1. Type II diabetes mellitus with complication (HCC) Clinically stable by exam and report without s/s of hypoglycemia. DM complicated by hypertension and dyslipidemia. Tolerating medications well without side effects or other concerns - Lantus 15 units bid - POCT glycosylated hemoglobin (Hb A1C)  2. Essential (primary) hypertension Clinically stable exam with well controlled  BP. Tolerating medications without side effects at this time. Pt to continue current regimen and low sodium diet; benefits of regular exercise as able discussed.  3. Need for immunization against influenza - Flu Vaccine QUAD High Dose(Fluad)  4. Ovarian failure S/p rib fractures - will schedule DEXA in [place]. - DG Bone Density   Partially dictated using Editor, commissioning. Any errors are unintentional.  Halina Maidens, MD [facility]  10/06/2022

## 2022-10-23 ENCOUNTER — Ambulatory Visit
Admission: RE | Admit: 2022-10-23 | Discharge: 2022-10-23 | Disposition: A | Discharge status: Home / Self Care | Payer: Medicare Other | Source: Ambulatory Visit | Attending: Internal Medicine | Admitting: Internal Medicine

## 2022-10-23 DIAGNOSIS — M85852 Other specified disorders of bone density and structure, left thigh: Secondary | ICD-10-CM | POA: Diagnosis not present

## 2022-10-23 DIAGNOSIS — E2839 Other primary ovarian failure: Secondary | ICD-10-CM | POA: Diagnosis not present

## 2022-11-19 ENCOUNTER — Other Ambulatory Visit: Payer: Self-pay | Admitting: Internal Medicine

## 2022-11-19 DIAGNOSIS — E118 Type 2 diabetes mellitus with unspecified complications: Secondary | ICD-10-CM

## 2022-11-23 ENCOUNTER — Other Ambulatory Visit: Payer: Self-pay | Admitting: Internal Medicine

## 2022-11-23 DIAGNOSIS — G40909 Epilepsy, unspecified, not intractable, without status epilepticus: Secondary | ICD-10-CM

## 2022-11-24 NOTE — Telephone Encounter (Signed)
Requested medication (s) are due for refill today:   Due in a couple of months  Requested medication (s) are on the active medication list:   Yes  Future visit scheduled:   Yes   Last ordered: 05/05/2022 #90, 2 refills  Returned because  Tegretol level due.     Requested Prescriptions  Pending Prescriptions Disp Refills   carbamazepine (TEGRETOL) 200 MG tablet [Pharmacy Med Name: CARBAMAZEPINE '200MG'$  TABLETS] 90 tablet 2    Sig: TAKE 1 TABLET(200 MG) BY MOUTH DAILY     Neurology:  Anticonvulsants - carbamazepine Failed - 11/23/2022  5:21 PM      Failed - Carbamazepine (serum) in normal range and within 360 days    Carbamazepine (Tegretol), S  Date Value Ref Range Status  12/21/2019 6.0 4.0 - 12.0 ug/mL Final    Comment:             In conjunction with other antiepileptic drugs                                Therapeutic  4.0 -  8.0                                Toxicity     9.0 - 12.0                                    Carbamazepine alone                                Therapeutic  8.0 - 12.0                                 Detection Limit =  2.0                           <2.0 indicated None Detected          Failed - Cr in normal range and within 360 days    Creatinine  Date Value Ref Range Status  05/04/2013 1.01 0.60 - 1.30 mg/dL Final   Creatinine, Ser  Date Value Ref Range Status  07/04/2022 1.11 (H) 0.57 - 1.00 mg/dL Final         Passed - AST in normal range and within 360 days    AST  Date Value Ref Range Status  12/27/2021 15 0 - 40 IU/L Final   SGOT(AST)  Date Value Ref Range Status  05/04/2013 11 (L) 15 - 37 Unit/L Final         Passed - ALT in normal range and within 360 days    ALT  Date Value Ref Range Status  12/27/2021 10 0 - 32 IU/L Final   SGPT (ALT)  Date Value Ref Range Status  05/04/2013 19 12 - 78 U/L Final         Passed - WBC in normal range and within 360 days    WBC  Date Value Ref Range Status  12/27/2021 7.4 3.4 - 10.8 x10E3/uL  Final  04/23/2021 8.0 4.0 - 10.5 K/uL Final         Passed - PLT in normal  range and within 360 days    Platelets  Date Value Ref Range Status  12/27/2021 215 150 - 450 x10E3/uL Final         Passed - HGB in normal range and within 360 days    Hemoglobin  Date Value Ref Range Status  12/27/2021 13.4 11.1 - 15.9 g/dL Final         Passed - Na in normal range and within 360 days    Sodium  Date Value Ref Range Status  07/04/2022 138 134 - 144 mmol/L Final  04/13/2013 136 136 - 145 mmol/L Final         Passed - HCT in normal range and within 360 days    Hematocrit  Date Value Ref Range Status  12/27/2021 41.0 34.0 - 46.6 % Final         Passed - Completed PHQ-2 or PHQ-9 in the last 360 days      Passed - Valid encounter within last 12 months    Recent Outpatient Visits           1 month ago Type II diabetes mellitus with complication [facility])   [place] [facility] at [facility], Jesse Sans, MD   2 months ago Acute cough   [place] [facility] at [facility], Jesse Sans, MD   4 months ago Multiple rib fractures involving four or more ribs   [place] [facility] at [facility], Jesse Sans, MD   11 months ago Annual physical exam   [facility] at [facility], Jesse Sans, MD   1 year ago Essential (primary) hypertension   [place] [facility] at [facility], Jesse Sans, MD       Future Appointments             In 1 month Army Melia, Jesse Sans, MD [place] [facility] at [facility], [facility]

## 2022-11-27 DIAGNOSIS — H903 Sensorineural hearing loss, bilateral: Secondary | ICD-10-CM | POA: Diagnosis not present

## 2022-11-27 DIAGNOSIS — H6063 Unspecified chronic otitis externa, bilateral: Secondary | ICD-10-CM | POA: Diagnosis not present

## 2022-11-27 DIAGNOSIS — H6123 Impacted cerumen, bilateral: Secondary | ICD-10-CM | POA: Diagnosis not present

## 2022-12-15 ENCOUNTER — Other Ambulatory Visit: Payer: Self-pay | Admitting: Internal Medicine

## 2022-12-29 ENCOUNTER — Ambulatory Visit: Payer: Medicare Other

## 2022-12-30 ENCOUNTER — Encounter: Payer: Self-pay | Admitting: Internal Medicine

## 2022-12-30 ENCOUNTER — Ambulatory Visit (INDEPENDENT_AMBULATORY_CARE_PROVIDER_SITE_OTHER): Payer: Medicare Other | Admitting: Internal Medicine

## 2022-12-30 VITALS — BP 125/74 | HR 74 | Ht 62.0 in | Wt 154.2 lb

## 2022-12-30 DIAGNOSIS — I10 Essential (primary) hypertension: Secondary | ICD-10-CM | POA: Diagnosis not present

## 2022-12-30 DIAGNOSIS — E785 Hyperlipidemia, unspecified: Secondary | ICD-10-CM | POA: Diagnosis not present

## 2022-12-30 DIAGNOSIS — I700 Atherosclerosis of aorta: Secondary | ICD-10-CM

## 2022-12-30 DIAGNOSIS — E118 Type 2 diabetes mellitus with unspecified complications: Secondary | ICD-10-CM | POA: Diagnosis not present

## 2022-12-30 DIAGNOSIS — E1169 Type 2 diabetes mellitus with other specified complication: Secondary | ICD-10-CM | POA: Diagnosis not present

## 2022-12-30 DIAGNOSIS — Z0000 Encounter for general adult medical examination without abnormal findings: Secondary | ICD-10-CM

## 2022-12-30 DIAGNOSIS — N1831 Chronic kidney disease, stage 3a: Secondary | ICD-10-CM | POA: Insufficient documentation

## 2022-12-30 DIAGNOSIS — Z85048 Personal history of other malignant neoplasm of rectum, rectosigmoid junction, and anus: Secondary | ICD-10-CM

## 2022-12-30 DIAGNOSIS — G40909 Epilepsy, unspecified, not intractable, without status epilepticus: Secondary | ICD-10-CM

## 2022-12-30 DIAGNOSIS — Z1231 Encounter for screening mammogram for malignant neoplasm of breast: Secondary | ICD-10-CM

## 2022-12-30 DIAGNOSIS — R269 Unspecified abnormalities of gait and mobility: Secondary | ICD-10-CM

## 2022-12-30 MED ORDER — LANTUS SOLOSTAR 100 UNIT/ML ~~LOC~~ SOPN: 15.0000 [IU] | PEN_INJECTOR | Freq: Two times a day (BID) | SUBCUTANEOUS | 0 refills | Status: DC

## 2022-12-30 NOTE — Assessment & Plan Note (Addendum)
Clinically stable without s/s of hypoglycemia. Tolerating medications well without side effects or other concerns. Continue Lantus  - dose increased to 15 units bid

## 2022-12-30 NOTE — Assessment & Plan Note (Signed)
Tolerating statin medication without side effects at this time LDL is  Lab Results  Component Value Date   LDLCALC 82 12/27/2021   with a goal of < 70. Current dose is appropriate but will be adjusted if needed.

## 2022-12-30 NOTE — Assessment & Plan Note (Signed)
On Carbamazepine No seizure activity in > 20 yrs

## 2022-12-30 NOTE — Progress Notes (Signed)
Date:  12/30/2022   Name:  [name]   DOB:  [birth year]   MRN:  [id number]   Chief Complaint: Annual Exam [name] is a 77 y.o. female who presents today for her Complete Annual Exam. She feels well. She reports exercising- none. She reports she is sleeping fairly well. Breast complaints - none. She is worried about her memory which does not feel as sharp.  She has not had any issues with medication, driving, paying bills etc.  Mammogram: 02/2022 @ [facility] DEXA: 10/2022 osteopenia hip Colonoscopy: 03/2022 repeat 1 yr  Health Maintenance Due  Topic Date Due   COVID-19 Vaccine (3 - Pfizer risk series) 03/09/2020   Medicare Annual Wellness (AWV)  12/25/2022   Diabetic kidney evaluation - Urine ACR  12/27/2022    Immunization History  Administered Date(s) Administered   Fluad Quad(high Dose 65+) 08/29/2019, 08/23/2020, 08/01/2021, 10/06/2022   Influenza, High Dose Seasonal PF 09/21/2017, 09/02/2018   Influenza,inj,Quad PF,6+ Mos 08/20/2015, 09/17/2016   PFIZER(Purple Top)SARS-COV-2 Vaccination 01/10/2020, 02/10/2020   Pneumococcal Conjugate-13 10/22/2015   Pneumococcal Polysaccharide-23 07/17/2017   Tdap 12/19/2014    Hypertension This is a chronic problem. The problem is controlled. Pertinent negatives include no chest pain, headaches, palpitations or shortness of breath.  Diabetes She presents for her follow-up diabetic visit. She has type 2 diabetes mellitus. Her disease course has been stable. Pertinent negatives for hypoglycemia include no dizziness, headaches, nervousness/anxiousness or tremors. Pertinent negatives for diabetes include no chest pain, no fatigue, no polydipsia and no polyuria. Current diabetic treatment includes insulin injections. She is compliant with treatment all of the time. Her breakfast blood glucose is taken between 6-7 am. Her breakfast blood glucose range is generally 110-130 mg/dl.  Hyperlipidemia This is a chronic problem. Pertinent  negatives include no chest pain or shortness of breath. Current antihyperlipidemic treatment includes statins.  Gait imbalance - she feels unsteady on her feet for any distances, uses cart in the grocery.  Thinking about getting a cane.  We had discussed PT several visits ago - she would like to pursue this.   Lab Results  Component Value Date   NA 138 07/04/2022   K 4.4 07/04/2022   CO2 24 07/04/2022   GLUCOSE 134 (H) 07/04/2022   BUN 14 07/04/2022   CREATININE 1.11 (H) 07/04/2022   CALCIUM 9.4 07/04/2022   EGFR 52 (L) 07/04/2022   GFRNONAA >60 04/23/2021   Lab Results  Component Value Date   CHOL 179 12/27/2021   HDL 83 12/27/2021   LDLCALC 82 12/27/2021   TRIG 75 12/27/2021   CHOLHDL 2.2 12/27/2021   Lab Results  Component Value Date   TSH 1.660 12/27/2021   Lab Results  Component Value Date   HGBA1C 7.5 (A) 10/06/2022   Lab Results  Component Value Date   WBC 7.4 12/27/2021   HGB 13.4 12/27/2021   HCT 41.0 12/27/2021   MCV 91 12/27/2021   PLT 215 12/27/2021   Lab Results  Component Value Date   ALT 10 12/27/2021   AST 15 12/27/2021   ALKPHOS 143 (H) 12/27/2021   BILITOT 0.3 12/27/2021   Lab Results  Component Value Date   VD25OH 35.3 12/25/2020     Review of Systems  Constitutional:  Negative for chills, fatigue and fever.  HENT:  Negative for congestion, hearing loss, tinnitus, trouble swallowing and voice change.   Eyes:  Negative for visual disturbance.  Respiratory:  Negative for cough, chest tightness, shortness of breath  and wheezing.   Cardiovascular:  Negative for chest pain, palpitations and leg swelling.  Gastrointestinal:  Negative for abdominal pain, constipation, diarrhea and vomiting.  Endocrine: Negative for polydipsia and polyuria.  Genitourinary:  Negative for dysuria, frequency, genital sores, vaginal bleeding and vaginal discharge.  Musculoskeletal:  Positive for gait problem (feels unsteady - no recent falls). Negative for  arthralgias and joint swelling.  Skin:  Negative for color change and rash.  Neurological:  Negative for dizziness, tremors, light-headedness and headaches.  Hematological:  Negative for adenopathy. Does not bruise/bleed easily.  Psychiatric/Behavioral:  Positive for sleep disturbance (vivid dreams at times). Negative for dysphoric mood. The patient is not nervous/anxious.     Patient Active Problem List   Diagnosis Date Noted   Stage 3a chronic kidney disease (CKD) ([place]) 12/30/2022   Trigger finger, left ring finger 12/27/2021   Primary osteoarthritis of both hips 06/10/2019   Bilateral carotid artery stenosis 08/24/2018   Atherosclerosis of aorta ([place]) 08/24/2018   Type II diabetes mellitus with complication ([place]) [id number]   Bradycardia 07/24/2018   Neoplasm of uncertain behavior of skin 01/16/2017   Senile ecchymosis 08/20/2015   Essential (primary) hypertension 05/08/2015   History of rectal cancer 05/08/2015   Hyperlipidemia associated with type 2 diabetes mellitus ([place]) 05/08/2015   Seizure disorder ([place]) 05/08/2015   Shoulder strain 05/08/2015   Avitaminosis D 05/08/2015    Allergies  Allergen Reactions   Baclofen Other (See Comments)    Hallucinations   Pioglitazone Nausea Only   Jardiance [Empagliflozin] Itching    Yeast vaginitis    Past Surgical History:  Procedure Laterality Date   CATARACT EXTRACTION, BILATERAL  01/2020   Dr. Lucianne Lei [facility]   COLOSTOMY REVERSAL  01/2014   ILEOSTOMY  [id number]   UMBILICAL HERNIA REPAIR  05/2018    Social History   Tobacco Use   Smoking status: Never   Smokeless tobacco: Never  Vaping Use   Vaping Use: Never used  Substance Use Topics   Alcohol use: No    Alcohol/week: 0.0 standard drinks of alcohol   Drug use: No     Medication list has been reviewed and updated.  Current Meds  Medication Sig   aspirin 81 MG chewable tablet Chew 81 mg by mouth daily.    atorvastatin (LIPITOR) 40 MG tablet TAKE 1 TABLET BY MOUTH DAILY    Bacillus Coagulans-Inulin (PROBIOTIC) 1-250 BILLION-MG CAPS Take 1 capsule by mouth daily.   calcium carbonate (OSCAL) 1500 (600 Ca) MG TABS tablet Take 600 mg of elemental calcium by mouth daily.   carbamazepine (TEGRETOL) 200 MG tablet TAKE 1 TABLET(200 MG) BY MOUTH DAILY   Cholecalciferol 25 MCG (1000 UT) capsule Take 1,000 Units by mouth daily.    docusate sodium (COLACE) 250 MG capsule Take 250 mg by mouth daily.   Fluocinolone Acetonide 0.01 % OIL Place 1 drop into both ears at bedtime. Alternating ears each night   glucose blood (ONETOUCH ULTRA) test strip Use to test Blood Sugar 1-2 times daily.   lidocaine (LIDODERM) 5 % Place 1 patch onto the skin every 12 (twelve) hours. Remove & Discard patch within 12 hours or as directed by MD   metoprolol succinate (TOPROL-XL) 25 MG 24 hr tablet TAKE 1/2 TABLET(12.5 MG) BY MOUTH TWICE DAILY   nitroGLYCERIN (NITROSTAT) 0.4 MG SL tablet Place 1 tablet (0.4 mg total) under the tongue every 5 (five) minutes x 3 doses as needed for chest pain.   oxyCODONE (ROXICODONE) 5 MG immediate release tablet  Take 1 tablet (5 mg total) by mouth every 8 (eight) hours as needed.   pantoprazole (PROTONIX) 40 MG tablet Take 1 tablet (40 mg total) by mouth daily as needed (heartburn).   Propylene Glycol (SYSTANE BALANCE OP) Place 1 drop into both eyes 2 (two) times daily as needed (dry eyes).   ramipril (ALTACE) 10 MG capsule Take 1 capsule (10 mg total) by mouth daily.   trolamine salicylate (ASPERCREME) 10 % cream Apply 1 application topically as needed for muscle pain.   [DISCONTINUED] LANTUS SOLOSTAR 100 UNIT/ML Solostar Pen INJECT 20 TO 25 UNITS UNDER THE SKIN DAILY       12/30/2022   10:45 AM 10/06/2022   11:16 AM 09/11/2022   10:14 AM 07/04/2022    3:56 PM  GAD 7 : Generalized Anxiety Score  Nervous, Anxious, on Edge 1 1 0 1  Control/stop worrying 1 0 0 1  Worry too much - different things 1 0 1 1  Trouble relaxing 0 1 0 1  Restless 0 0 0 0  Easily  annoyed or irritable 0 0 1 0  Afraid - awful might happen 1 0 0 1  Total GAD 7 Score 4 2 2 5  $ Anxiety Difficulty Not difficult at all Not difficult at all Not difficult at all Not difficult at all       12/30/2022   10:45 AM 10/06/2022   11:15 AM 09/11/2022   10:14 AM  Depression screen PHQ 2/9  Decreased Interest 1 1 0  Down, Depressed, Hopeless 1 1 0  PHQ - 2 Score 2 2 0  Altered sleeping 1 0 1  Tired, decreased energy 1 2 1  $ Change in appetite 1 0 1  Feeling bad or failure about yourself  1 0 0  Trouble concentrating 1 0 0  Moving slowly or fidgety/restless 1 0 0  Suicidal thoughts 0 0 0  PHQ-9 Score 8 4 3  $ Difficult doing work/chores Somewhat difficult Not difficult at all Not difficult at all    BP Readings from Last 3 Encounters:  12/30/22 125/74  10/06/22 128/70  09/11/22 (!) 150/80    Physical Exam Vitals and nursing note reviewed.  Constitutional:      General: She is not in acute distress.    Appearance: She is well-developed.  HENT:     Head: Normocephalic and atraumatic.     Right Ear: Tympanic membrane and ear canal normal.     Left Ear: Tympanic membrane and ear canal normal.     Nose:     Right Sinus: No maxillary sinus tenderness.     Left Sinus: No maxillary sinus tenderness.  Eyes:     General: No scleral icterus.       Right eye: No discharge.        Left eye: No discharge.     Conjunctiva/sclera: Conjunctivae normal.  Neck:     Thyroid: No thyromegaly.     Vascular: No carotid bruit.  Cardiovascular:     Rate and Rhythm: Normal rate and regular rhythm.     Pulses: Normal pulses.     Heart sounds: Normal heart sounds.  Pulmonary:     Effort: Pulmonary effort is normal. No respiratory distress.     Breath sounds: No wheezing.  Chest:  Breasts:    Right: No mass, nipple discharge, skin change or tenderness.     Left: No mass, nipple discharge, skin change or tenderness.  Abdominal:     General: Bowel sounds are  normal.     Palpations:  Abdomen is soft.     Tenderness: There is no abdominal tenderness.  Musculoskeletal:     Cervical back: Normal range of motion. No erythema.     Right lower leg: No edema.     Left lower leg: No edema.  Lymphadenopathy:     Cervical: No cervical adenopathy.  Skin:    General: Skin is warm and dry.     Capillary Refill: Capillary refill takes less than 2 seconds.     Findings: No rash.  Neurological:     General: No focal deficit present.     Mental Status: She is alert and oriented to person, place, and time.     Cranial Nerves: No cranial nerve deficit.     Sensory: No sensory deficit.     Deep Tendon Reflexes: Reflexes are normal and symmetric.  Psychiatric:        Attention and Perception: Attention normal.        Mood and Affect: Mood normal.    Diabetic Foot Exam - Simple   Simple Foot Form Diabetic Foot exam was performed with the following findings: Yes 12/30/2022 11:02 AM  Visual Inspection Sensation Testing Intact to touch and monofilament testing bilaterally: Yes Pulse Check Posterior Tibialis and Dorsalis pulse intact bilaterally: Yes Comments Second toe deformity both feet bunions      Wt Readings from Last 3 Encounters:  12/30/22 154 lb 3.2 oz (69.9 kg)  10/06/22 152 lb 6.4 oz (69.1 kg)  09/11/22 152 lb 3.2 oz (69 kg)    BP 125/74 (BP Location: Right Arm, Cuff Size: Normal)   Pulse 74   Ht 5' 2"$  (1.575 m)   Wt 154 lb 3.2 oz (69.9 kg)   SpO2 98%   BMI 28.20 kg/m   Assessment and Plan: Problem List Items Addressed This Visit       Cardiovascular and Mediastinum   Atherosclerosis of aorta (HCC) (Chronic)    On statin and aspirin      Relevant Orders   Lipid panel   Essential (primary) hypertension (Chronic)    Clinically stable exam with well controlled BP on ramipril and metoprolol. Tolerating medications without side effects. Pt to continue current regimen and low sodium diet.       Relevant Orders   CBC with Differential/Platelet    Comprehensive metabolic panel     Endocrine   Hyperlipidemia associated with type 2 diabetes mellitus ([place]) (Chronic)    Tolerating statin medication without side effects at this time LDL is  Lab Results  Component Value Date   LDLCALC 82 12/27/2021  with a goal of < 70. Current dose is appropriate but will be adjusted if needed.       Relevant Medications   insulin glargine (LANTUS SOLOSTAR) 100 UNIT/ML Solostar Pen   Other Relevant Orders   Lipid panel   Type II diabetes mellitus with complication (HCC) (Chronic)    Clinically stable without s/s of hypoglycemia. Tolerating medications well without side effects or other concerns. Continue Lantus  - dose increased to 15 units bid       Relevant Medications   insulin glargine (LANTUS SOLOSTAR) 100 UNIT/ML Solostar Pen   Other Relevant Orders   Microalbumin / creatinine urine ratio   Comprehensive metabolic panel   Hemoglobin A1c     Nervous and Auditory   Seizure disorder (HCC) (Chronic)    On Carbamazepine No seizure activity in > 20 yrs      Relevant  Orders   Carbamazepine Level (Tegretol), total     Genitourinary   Stage 3a chronic kidney disease (CKD) (HCC) (Chronic)    GFR ~ 52 Continuing to monitor Patient advised to avoid nsaids      Relevant Orders   Comprehensive metabolic panel     Other   History of rectal cancer    No symptoms to suggest recurrence Last Colonoscopy 03/2022 - due for repeat this year at [facility]      Other Visit Diagnoses     Annual physical exam    -  Primary   up to date on vaccinations continues with mammograms yearly   Encounter for screening mammogram for breast cancer       done at [facility]   Gait disturbance       Relevant Orders   Ambulatory referral to Physical Therapy        Partially dictated using Dragon software. Any errors are unintentional.  Halina Maidens, MD [facility]  12/30/2022

## 2022-12-30 NOTE — Assessment & Plan Note (Signed)
Clinically stable exam with well controlled BP on ramipril and metoprolol. Tolerating medications without side effects. Pt to continue current regimen and low sodium diet.

## 2022-12-30 NOTE — Assessment & Plan Note (Signed)
No symptoms to suggest recurrence Last Colonoscopy 03/2022 - due for repeat this year at [facility]

## 2022-12-30 NOTE — Assessment & Plan Note (Signed)
On statin and aspirin

## 2022-12-30 NOTE — Assessment & Plan Note (Signed)
GFR ~ 52 Continuing to monitor Patient advised to avoid nsaids

## 2022-12-31 ENCOUNTER — Other Ambulatory Visit: Payer: Self-pay | Admitting: Internal Medicine

## 2022-12-31 DIAGNOSIS — E118 Type 2 diabetes mellitus with unspecified complications: Secondary | ICD-10-CM

## 2022-12-31 LAB — COMPREHENSIVE METABOLIC PANEL
ALT: 11 IU/L (ref 0–32)
AST: 14 IU/L (ref 0–40)
Albumin/Globulin Ratio: 2.2 (ref 1.2–2.2)
Albumin: 4.4 g/dL (ref 3.8–4.8)
Alkaline Phosphatase: 163 IU/L — ABNORMAL HIGH (ref 44–121)
BUN/Creatinine Ratio: 13 (ref 12–28)
BUN: 15 mg/dL (ref 8–27)
Bilirubin Total: 0.4 mg/dL (ref 0.0–1.2)
CO2: 24 mmol/L (ref 20–29)
Calcium: 10 mg/dL (ref 8.7–10.3)
Chloride: 101 mmol/L (ref 96–106)
Creatinine, Ser: 1.12 mg/dL — ABNORMAL HIGH (ref 0.57–1.00)
Globulin, Total: 2 g/dL (ref 1.5–4.5)
Glucose: 83 mg/dL (ref 70–99)
Potassium: 4.9 mmol/L (ref 3.5–5.2)
Sodium: 142 mmol/L (ref 134–144)
Total Protein: 6.4 g/dL (ref 6.0–8.5)
eGFR: 51 mL/min/{1.73_m2} — ABNORMAL LOW (ref >59)

## 2022-12-31 LAB — LIPID PANEL
Chol/HDL Ratio: 2.2 ratio (ref 0.0–4.4)
Cholesterol, Total: 203 mg/dL — ABNORMAL HIGH (ref 100–199)
HDL: 94 mg/dL (ref >39)
LDL Chol Calc (NIH): 94 mg/dL (ref 0–99)
Triglycerides: 88 mg/dL (ref 0–149)
VLDL Cholesterol Cal: 15 mg/dL (ref 5–40)

## 2022-12-31 LAB — CBC WITH DIFFERENTIAL/PLATELET
Basophils Absolute: 0 10*3/uL (ref 0.0–0.2)
Basos: 1 %
EOS (ABSOLUTE): 0.1 10*3/uL (ref 0.0–0.4)
Eos: 2 %
Hematocrit: 41 % (ref 34.0–46.6)
Hemoglobin: 13.5 g/dL (ref 11.1–15.9)
Immature Grans (Abs): 0 10*3/uL (ref 0.0–0.1)
Immature Granulocytes: 0 %
Lymphocytes Absolute: 1.1 10*3/uL (ref 0.7–3.1)
Lymphs: 14 %
MCH: 30.3 pg (ref 26.6–33.0)
MCHC: 32.9 g/dL (ref 31.5–35.7)
MCV: 92 fL (ref 79–97)
Monocytes Absolute: 0.9 10*3/uL (ref 0.1–0.9)
Monocytes: 12 %
Neutrophils Absolute: 5.6 10*3/uL (ref 1.4–7.0)
Neutrophils: 71 %
Platelets: 235 10*3/uL (ref 150–450)
RBC: 4.46 x10E6/uL (ref 3.77–5.28)
RDW: 11.9 % (ref 11.7–15.4)
WBC: 7.8 10*3/uL (ref 3.4–10.8)

## 2022-12-31 LAB — MICROALBUMIN / CREATININE URINE RATIO
Creatinine, Urine: 68.2 mg/dL
Microalb/Creat Ratio: 16 mg/g creat (ref 0–29)
Microalbumin, Urine: 10.6 ug/mL

## 2022-12-31 LAB — CARBAMAZEPINE LEVEL, TOTAL: Carbamazepine (Tegretol), S: 6.3 ug/mL (ref 4.0–12.0)

## 2022-12-31 LAB — HEMOGLOBIN A1C
Est. average glucose Bld gHb Est-mCnc: 206 mg/dL
Hgb A1c MFr Bld: 8.8 % — ABNORMAL HIGH (ref 4.8–5.6)

## 2022-12-31 MED ORDER — LANTUS SOLOSTAR 100 UNIT/ML ~~LOC~~ SOPN: PEN_INJECTOR | SUBCUTANEOUS | 0 refills | Status: DC

## 2023-01-01 ENCOUNTER — Ambulatory Visit (INDEPENDENT_AMBULATORY_CARE_PROVIDER_SITE_OTHER): Payer: Medicare Other

## 2023-01-01 ENCOUNTER — Other Ambulatory Visit: Payer: Self-pay | Admitting: Internal Medicine

## 2023-01-01 VITALS — BP 138/60 | Ht 62.0 in | Wt 156.6 lb

## 2023-01-01 DIAGNOSIS — Z0000 Encounter for general adult medical examination without abnormal findings: Secondary | ICD-10-CM | POA: Diagnosis not present

## 2023-01-01 DIAGNOSIS — I10 Essential (primary) hypertension: Secondary | ICD-10-CM

## 2023-01-01 NOTE — Telephone Encounter (Signed)
Requested Prescriptions  Pending Prescriptions Disp Refills   ramipril (ALTACE) 10 MG capsule [Pharmacy Med Name: RAMIPRIL 10MG CAPSULES] 90 capsule 1    Sig: TAKE 1 CAPSULE(10 MG) BY MOUTH DAILY     Cardiovascular:  ACE Inhibitors Failed - 01/01/2023  5:06 PM      Failed - Cr in normal range and within 180 days    Creatinine  Date Value Ref Range Status  05/04/2013 1.01 0.60 - 1.30 mg/dL Final   Creatinine, Ser  Date Value Ref Range Status  12/30/2022 1.12 (H) 0.57 - 1.00 mg/dL Final         Passed - K in normal range and within 180 days    Potassium  Date Value Ref Range Status  12/30/2022 4.9 3.5 - 5.2 mmol/L Final  05/04/2013 4.0 3.5 - 5.1 mmol/L Final         Passed - Patient is not pregnant      Passed - Last BP in normal range    BP Readings from Last 1 Encounters:  01/01/23 138/60         Passed - Valid encounter within last 6 months    Recent Outpatient Visits           2 days ago Annual physical exam   [facility], Jesse Sans, MD   2 months ago Type II diabetes mellitus with complication [facility])   [place] at [facility], Jesse Sans, MD   3 months ago Acute cough   [place] [facility], Jesse Sans, MD   6 months ago Multiple rib fractures involving four or more ribs   [facility] [facility], Jesse Sans, MD   1 year ago Annual physical exam   [place] at [facility], Jesse Sans, MD       Future Appointments             In 4 months [facility], Jesse Sans, MD [place] at [facility], [facility]

## 2023-01-01 NOTE — Progress Notes (Signed)
Subjective:   [name] is a 77 y.o. female who presents for Medicare Annual (Subsequent) preventive examination.  Review of Systems     Cardiac Risk Factors include: diabetes mellitus;advanced age (>77mn, >>52women);hypertension;sedentary lifestyle     Objective:    Today's Vitals   01/01/23 1427 01/01/23 1431  BP: 138/60   Weight: 156 lb 9.6 oz (71 kg)   Height: 5' 2"$  (1.575 m)   PainSc:  3    Body mass index is 28.64 kg/m.     01/01/2023    2:41 PM 06/19/2022    9:34 PM 12/25/2021    3:07 PM 05/24/2021    6:57 AM 03/31/2021    1:52 AM 03/24/2021    6:42 PM 12/24/2020    3:57 PM  Advanced Directives  Does Patient Have a Medical Advance Directive? No No No No No No No  Would patient like information on creating a medical advance directive? No - Patient declined No - Patient declined No - Patient declined No - Patient declined  No - Patient declined No - Patient declined    Current Medications (verified) Outpatient Encounter Medications as of 01/01/2023  Medication Sig   aspirin 81 MG chewable tablet Chew 81 mg by mouth daily.    atorvastatin (LIPITOR) 40 MG tablet TAKE 1 TABLET BY MOUTH DAILY   Bacillus Coagulans-Inulin (PROBIOTIC) 1-250 BILLION-MG CAPS Take 1 capsule by mouth daily.   calcium carbonate (OSCAL) 1500 (600 Ca) MG TABS tablet Take 600 mg of elemental calcium by mouth daily.   carbamazepine (TEGRETOL) 200 MG tablet TAKE 1 TABLET(200 MG) BY MOUTH DAILY   Cholecalciferol 25 MCG (1000 UT) capsule Take 1,000 Units by mouth daily.    docusate sodium (COLACE) 250 MG capsule Take 250 mg by mouth daily.   glucose blood (ONETOUCH ULTRA) test strip Use to test Blood Sugar 1-2 times daily.   insulin glargine (LANTUS SOLOSTAR) 100 UNIT/ML Solostar Pen 20 units in AM and 15 units in PM   metoprolol succinate (TOPROL-XL) 25 MG 24 hr tablet TAKE 1/2 TABLET(12.5 MG) BY MOUTH TWICE DAILY   nitroGLYCERIN (NITROSTAT) 0.4 MG SL tablet Place 1 tablet (0.4 mg total) under the  tongue every 5 (five) minutes x 3 doses as needed for chest pain.   Propylene Glycol (SYSTANE BALANCE OP) Place 1 drop into both eyes 2 (two) times daily as needed (dry eyes).   ramipril (ALTACE) 10 MG capsule Take 1 capsule (10 mg total) by mouth daily.   Fluocinolone Acetonide 0.01 % OIL Place 1 drop into both ears at bedtime. Alternating ears each night (Patient not taking: Reported on 01/01/2023)   lidocaine (LIDODERM) 5 % Place 1 patch onto the skin every 12 (twelve) hours. Remove & Discard patch within 12 hours or as directed by MD (Patient not taking: Reported on 01/01/2023)   oxyCODONE (ROXICODONE) 5 MG immediate release tablet Take 1 tablet (5 mg total) by mouth every 8 (eight) hours as needed. (Patient not taking: Reported on 01/01/2023)   pantoprazole (PROTONIX) 40 MG tablet Take 1 tablet (40 mg total) by mouth daily as needed (heartburn).   trolamine salicylate (ASPERCREME) 10 % cream Apply 1 application topically as needed for muscle pain. (Patient not taking: Reported on 01/01/2023)   No facility-administered encounter medications on file as of 01/01/2023.    Allergies (verified) Baclofen, Pioglitazone, and Jardiance [empagliflozin]   History: Past Medical History:  Diagnosis Date   Allergy    Arthritis of knee, degenerative 05/08/2015  Basal cell carcinoma 03/2019   nose   Colon cancer ([place]) 2014   Partial colon resection, chemo + rad tx's.    Complication of anesthesia    Diabetes mellitus without complication ([place])    Pt takes Metformin.   Hyperlipidemia    Hypertension    PONV (postoperative nausea and vomiting)    SBO (small bowel obstruction) ([place]) 04/20/2018   Seizures ([place])    Umbilical hernia with obstruction but no gangrene    Vitamin D deficiency    Past Surgical History:  Procedure Laterality Date   CATARACT EXTRACTION, BILATERAL  01/2020   Dr. Lucianne Lei [facility]   COLOSTOMY REVERSAL  01/2014   ILEOSTOMY  [id number]   UMBILICAL HERNIA REPAIR  05/2018   Family History   Problem Relation Age of Onset   Cancer Mother 44       unknown type   Diabetes Daughter    Diabetes Son    Social History   Socioeconomic History   Marital status: Widowed    Spouse name: Not on file   Number of children: 3   Years of education: Not on file   Highest education level: Some college, no degree  Occupational History   Not on file  Tobacco Use   Smoking status: Never   Smokeless tobacco: Never  Vaping Use   Vaping Use: Never used  Substance and Sexual Activity   Alcohol use: No    Alcohol/week: 0.0 standard drinks of alcohol   Drug use: No   Sexual activity: Not Currently  Other Topics Concern   Not on file  Social History Narrative   Pt's daughter lives with her   Social Determinants of Health   Financial Resource Strain: Low Risk  (01/01/2023)   Overall Financial Resource Strain (CARDIA)    Difficulty of Paying Living Expenses: Not hard at all  Food Insecurity: No Food Insecurity (01/01/2023)   Hunger Vital Sign    Worried About Running Out of Food in the Last Year: Never true    Ran Out of Food in the Last Year: Never true  Transportation Needs: No Transportation Needs (01/01/2023)   PRAPARE - Hydrologist (Medical): No    Lack of Transportation (Non-Medical): No  Physical Activity: Insufficiently Active (01/01/2023)   Exercise Vital Sign    Days of Exercise per Week: 2 days    Minutes of Exercise per Session: 20 min  Stress: No Stress Concern Present (01/01/2023)   [place]    Feeling of Stress : Not at all  Social Connections: Moderately Isolated (01/01/2023)   Social Connection and Isolation Panel [NHANES]    Frequency of Communication with Friends and Family: More than three times a week    Frequency of Social Gatherings with Friends and Family: More than three times a week    Attends Religious Services: More than 4 times per year    Active Member of  [employer] or Organizations: No    Attends Archivist Meetings: Never    Marital Status: Widowed    Tobacco Counseling Counseling given: Not Answered   Clinical Intake:  Pre-visit preparation completed: Yes  Pain : 0-10 Pain Score: 3  Pain Type: Chronic pain Pain Location: Shoulder Pain Orientation: Left, Upper     Nutritional Risks: None Diabetes: Yes CBG done?: No Did pt. bring in CBG monitor from home?: No  How often do you need to have someone help you when  you read instructions, pamphlets, or other written materials from your doctor or pharmacy?: 1 - Never  Diabetic?yes Nutrition Risk Assessment:  Has the patient had any N/V/D within the last 2 months?  Yes  Does the patient have any non-healing wounds?  No  Has the patient had any unintentional weight loss or weight gain?  No   Diabetes:  Is the patient diabetic?  Yes  If diabetic, was a CBG obtained today?  No  Did the patient bring in their glucometer from home?  No  How often do you monitor your CBG's? Once/day.   Financial Strains and Diabetes Management:  Are you having any financial strains with the device, your supplies or your medication? No .  Does the patient want to be seen by Chronic Care Management for management of their diabetes?  No  Would the patient like to be referred to a Nutritionist or for Diabetic Management?  No   Diabetic Exams:  Diabetic Eye Exam: Completed 05/06/22.  Pt has been advised about the importance in completing this exam.  Diabetic Foot Exam: Completed 12/30/22. Pt has been advised about the importance in completing this exam.   Interpreter Needed?: No  Information entered by :: Kirke Shaggy, LPN   Activities of Daily Living    01/01/2023    2:45 PM  In your present state of health, do you have any difficulty performing the following activities:  Hearing? 0  Vision? 0  Difficulty concentrating or making decisions? 0  Walking or climbing stairs? 1   Dressing or bathing? 0  Doing errands, shopping? 0  Preparing Food and eating ? N  Using the Toilet? N  In the past six months, have you accidently leaked urine? N  Do you have problems with loss of bowel control? N  Managing your Medications? N  Managing your Finances? N  Housekeeping or managing your Housekeeping? N    Patient Care Team: Glean Hess, MD as PCP - General (Internal Medicine) Isaias Cowman, MD as Consulting Physician (Cardiology) Carloyn Manner, MD as Referring Physician (Otolaryngology) Baker Pierini, OD (Optometry) Ree Edman, MD (Dermatology) Gwinda Maine, MD as Referring Physician (Surgical Oncology) Baker Pierini, OD (Optometry)  Indicate any recent Medical Services you may have received from other than [facility] providers in the past year (date may be approximate).     Assessment:   This is a routine wellness examination for Anala.  Hearing/Vision screen Hearing Screening - Comments:: No aids Vision Screening - Comments:: Readers- used to go to Dr.Barker  Dietary issues and exercise activities discussed: Current Exercise Habits: Home exercise routine, Type of exercise: walking, Time (Minutes): 20, Frequency (Times/Week): 2, Weekly Exercise (Minutes/Week): 40, Intensity: Mild   Goals Addressed             This Visit's Progress    DIET - EAT MORE FRUITS AND VEGETABLES         Depression Screen    01/01/2023    2:37 PM 12/30/2022   10:45 AM 10/06/2022   11:15 AM 09/11/2022   10:14 AM 07/04/2022    3:56 PM 07/04/2022    3:48 PM 12/27/2021   10:53 AM  PHQ 2/9 Scores  PHQ - 2 Score 2 2 2 $ 0 1 0 0  PHQ- 9 Score 5 8 4 3 4  2    $ Fall Risk    01/01/2023    2:43 PM 12/30/2022   10:45 AM 10/06/2022   11:16 AM 09/11/2022   10:14 AM 07/04/2022  3:56 PM  Fall Risk   Falls in the past year? 1 1 1 1 1  $ Number falls in past yr: 1 1 1 1 1  $ Injury with Fall? 1 1 0 1 1  Risk for fall due to : Impaired balance/gait;History  of fall(s) History of fall(s);Impaired balance/gait History of fall(s) History of fall(s) History of fall(s)  Follow up Falls evaluation completed;Falls prevention discussed Falls evaluation completed Falls evaluation completed Falls evaluation completed Falls evaluation completed    FALL RISK PREVENTION PERTAINING TO THE HOME:  Any stairs in or around the home? Yes  If so, are there any without handrails? No  Home free of loose throw rugs in walkways, pet beds, electrical cords, etc? Yes  Adequate lighting in your home to reduce risk of falls? Yes   ASSISTIVE DEVICES UTILIZED TO PREVENT FALLS:  Life alert? No  Use of a cane, walker or w/c? No - looking into getting a cane Grab bars in the bathroom? Yes  Shower chair or bench in shower? Yes  Elevated toilet seat or a handicapped toilet? Yes   TIMED UP AND GO:  Was the test performed? Yes .  Length of time to ambulate 10 feet: 5 sec.   Gait slow and steady without use of assistive device  Cognitive Function:        01/01/2023    2:59 PM 12/24/2020    4:01 PM 12/05/2019   10:24 AM 12/01/2018   10:51 AM 09/21/2017    1:56 PM  6CIT Screen  What Year? 0 points 0 points 0 points 0 points 0 points  What month? 0 points 0 points 0 points 0 points 0 points  What time? 0 points 0 points 0 points 0 points 0 points  Count back from 20 0 points 0 points 0 points 0 points 0 points  Months in reverse 0 points 0 points 0 points 0 points 0 points  Repeat phrase 0 points 0 points 0 points 0 points 2 points  Total Score 0 points 0 points 0 points 0 points 2 points    Immunizations Immunization History  Administered Date(s) Administered   Fluad Quad(high Dose 65+) 08/29/2019, 08/23/2020, 08/01/2021, 10/06/2022   Influenza, High Dose Seasonal PF 09/21/2017, 09/02/2018   Influenza,inj,Quad PF,6+ Mos 08/20/2015, 09/17/2016   PFIZER(Purple Top)SARS-COV-2 Vaccination 01/10/2020, 02/10/2020   Pneumococcal Conjugate-13 10/22/2015   Pneumococcal  Polysaccharide-23 07/17/2017   Tdap 12/19/2014    TDAP status: Up to date  Flu Vaccine status: Up to date  Pneumococcal vaccine status: Up to date  Covid-19 vaccine status: Completed vaccines  Qualifies for Shingles Vaccine? Yes   Zostavax completed No   Shingrix Completed?: Yes  Screening Tests Health Maintenance  Topic Date Due   COVID-19 Vaccine (3 - Pfizer risk series) 03/09/2020   Zoster Vaccines- Shingrix (1 of 2) 03/30/2023 (Originally 10/15/1965)   MAMMOGRAM  03/04/2023   COLONOSCOPY (Pts 45-11yr Insurance coverage will need to be confirmed)  04/05/2023   OPHTHALMOLOGY EXAM  05/07/2023   HEMOGLOBIN A1C  06/30/2023   Diabetic kidney evaluation - eGFR measurement  12/31/2023   Diabetic kidney evaluation - Urine ACR  12/31/2023   FOOT EXAM  12/31/2023   Medicare Annual Wellness (AWV)  01/02/2024   DTaP/Tdap/Td (2 - Td or Tdap) 12/19/2024   Pneumonia Vaccine 78 Years old  Completed   INFLUENZA VACCINE  Completed   DEXA SCAN  Completed   Hepatitis C Screening  Completed   HPV VACCINES  Aged Out  Health Maintenance  Health Maintenance Due  Topic Date Due   COVID-19 Vaccine (3 - Pfizer risk series) 03/09/2020    Colorectal cancer screening: Type of screening: Colonoscopy. Completed 04/04/22. Repeat every 1 years- will make appointment for May this year  Mammogram status: Completed 03/03/22. Repeat every year- making appointment at [place] status: Completed 10/23/22. Results reflect: Bone density results: OSTEOPENIA. Repeat every 5 years.  Lung Cancer Screening: (Low Dose CT Chest recommended if Age 17-80 years, 30 pack-year currently smoking OR have quit w/in 15years.) does not qualify.   Additional Screening:  Hepatitis C Screening: does qualify; Completed 12/02/17  Vision Screening: Recommended annual ophthalmology exams for early detection of glaucoma and other disorders of the eye. Is the patient up to date with their annual eye exam?  Yes   Who is the provider or what is the name of the office in which the patient attends annual eye exams? Looking for new MD If pt is not established with a provider, would they like to be referred to a provider to establish care? No .  She will find one  Dental Screening: Recommended annual dental exams for proper oral hygiene  Community Resource Referral / Chronic Care Management: CRR required this visit?  No   CCM required this visit?  No      Plan:     I have personally reviewed and noted the following in the patient's chart:   Medical and social history Use of alcohol, tobacco or illicit drugs  Current medications and supplements including opioid prescriptions. Patient is currently taking opioid prescriptions. Information provided to patient regarding non-opioid alternatives. Patient advised to discuss non-opioid treatment plan with their provider. Functional ability and status Nutritional status Physical activity Advanced directives List of other physicians Hospitalizations, surgeries, and ER visits in previous 12 months Vitals Screenings to include cognitive, depression, and falls Referrals and appointments  In addition, I have reviewed and discussed with patient certain preventive protocols, quality metrics, and best practice recommendations. A written personalized care plan for preventive services as well as general preventive health recommendations were provided to patient.     Dionisio David, LPN   [phone]   Nurse Notes: none

## 2023-01-01 NOTE — Patient Instructions (Addendum)
[name] , Thank you for taking time to come for your Medicare Wellness Visit. I appreciate your ongoing commitment to your health goals. Please review the following plan we discussed and let me know if I can assist you in the future.   These are the goals we discussed:  Goals      DIET - EAT MORE FRUITS AND VEGETABLES     Exercise 150 minutes per week (moderate activity)     Recommend to increase exercise from 1 day/20 minutes per week to 5 days/30 minutes per week        This is a list of the screening recommended for you and due dates:  Health Maintenance  Topic Date Due   COVID-19 Vaccine (3 - Pfizer risk series) 03/09/2020   Zoster (Shingles) Vaccine (1 of 2) 03/30/2023*   Mammogram  03/04/2023   Colon Cancer Screening  04/05/2023   Eye exam for diabetics  05/07/2023   Hemoglobin A1C  06/30/2023   Yearly kidney function blood test for diabetes  12/31/2023   Yearly kidney health urinalysis for diabetes  12/31/2023   Complete foot exam   12/31/2023   Medicare Annual Wellness Visit  01/02/2024   DTaP/Tdap/Td vaccine (2 - Td or Tdap) 12/19/2024   Pneumonia Vaccine  Completed   Flu Shot  Completed   DEXA scan (bone density measurement)  Completed   Hepatitis C Screening: USPSTF Recommendation to screen - Ages 43-79 yo.  Completed   HPV Vaccine  Aged Out  *Topic was postponed. The date shown is not the original due date.    Advanced directives: no  Conditions/risks identified: none  Next appointment: Follow up in one year for your annual wellness visit 01/07/24 @ 2:30 in person   Preventive Care 65 Years and Older, Female Preventive care refers to lifestyle choices and visits with your health care provider that can promote health and wellness. What does preventive care include? A yearly physical exam. This is also called an annual well check. Dental exams once or twice a year. Routine eye exams. Ask your health care provider how often you should have your eyes  checked. Personal lifestyle choices, including: Daily care of your teeth and gums. Regular physical activity. Eating a healthy diet. Avoiding tobacco and drug use. Limiting alcohol use. Practicing safe sex. Taking low-dose aspirin every day. Taking vitamin and mineral supplements as recommended by your health care provider. What happens during an annual well check? The services and screenings done by your health care provider during your annual well check will depend on your age, overall health, lifestyle risk factors, and family history of disease. Counseling  Your health care provider may ask you questions about your: Alcohol use. Tobacco use. Drug use. Emotional well-being. Home and relationship well-being. Sexual activity. Eating habits. History of falls. Memory and ability to understand (cognition). Work and work Statistician. Reproductive health. Screening  You may have the following tests or measurements: Height, weight, and BMI. Blood pressure. Lipid and cholesterol levels. These may be checked every 5 years, or more frequently if you are over 51 years old. Skin check. Lung cancer screening. You may have this screening every year starting at age 100 if you have a 30-pack-year history of smoking and currently smoke or have quit within the past 15 years. Fecal occult blood test (FOBT) of the stool. You may have this test every year starting at age 21. Flexible sigmoidoscopy or colonoscopy. You may have a sigmoidoscopy every 5 years or a colonoscopy  every 10 years starting at age 26. Hepatitis C blood test. Hepatitis B blood test. Sexually transmitted disease (STD) testing. Diabetes screening. This is done by checking your blood sugar (glucose) after you have not eaten for a while (fasting). You may have this done every 1-3 years. Bone density scan. This is done to screen for osteoporosis. You may have this done starting at age 74. Mammogram. This may be done every 1-2  years. Talk to your health care provider about how often you should have regular mammograms. Talk with your health care provider about your test results, treatment options, and if necessary, the need for more tests. Vaccines  Your health care provider may recommend certain vaccines, such as: Influenza vaccine. This is recommended every year. Tetanus, diphtheria, and acellular pertussis (Tdap, Td) vaccine. You may need a Td booster every 10 years. Zoster vaccine. You may need this after age 64. Pneumococcal 13-valent conjugate (PCV13) vaccine. One dose is recommended after age 54. Pneumococcal polysaccharide (PPSV23) vaccine. One dose is recommended after age 75. Talk to your health care provider about which screenings and vaccines you need and how often you need them. This information is not intended to replace advice given to you by your health care provider. Make sure you discuss any questions you have with your health care provider. Document Released: 11/30/2015 Document Revised: 07/23/2016 Document Reviewed: 09/04/2015 Elsevier Interactive Patient Education  2017 [employer] Prevention in the Home Falls can cause injuries. They can happen to people of all ages. There are many things you can do to make your home safe and to help prevent falls. What can I do on the outside of my home? Regularly fix the edges of walkways and driveways and fix any cracks. Remove anything that might make you trip as you walk through a door, such as a raised step or threshold. Trim any bushes or trees on the path to your home. Use bright outdoor lighting. Clear any walking paths of anything that might make someone trip, such as rocks or tools. Regularly check to see if handrails are loose or broken. Make sure that both sides of any steps have handrails. Any raised decks and porches should have guardrails on the edges. Have any leaves, snow, or ice cleared regularly. Use sand or salt on walking paths  during winter. Clean up any spills in your garage right away. This includes oil or grease spills. What can I do in the bathroom? Use night lights. Install grab bars by the toilet and in the tub and shower. Do not use towel bars as grab bars. Use non-skid mats or decals in the tub or shower. If you need to sit down in the shower, use a plastic, non-slip stool. Keep the floor dry. Clean up any water that spills on the floor as soon as it happens. Remove soap buildup in the tub or shower regularly. Attach bath mats securely with double-sided non-slip rug tape. Do not have throw rugs and other things on the floor that can make you trip. What can I do in the bedroom? Use night lights. Make sure that you have a light by your bed that is easy to reach. Do not use any sheets or blankets that are too big for your bed. They should not hang down onto the floor. Have a firm chair that has side arms. You can use this for support while you get dressed. Do not have throw rugs and other things on the floor that can make  you trip. What can I do in the kitchen? Clean up any spills right away. Avoid walking on wet floors. Keep items that you use a lot in easy-to-reach places. If you need to reach something above you, use a strong step stool that has a grab bar. Keep electrical cords out of the way. Do not use floor polish or wax that makes floors slippery. If you must use wax, use non-skid floor wax. Do not have throw rugs and other things on the floor that can make you trip. What can I do with my stairs? Do not leave any items on the stairs. Make sure that there are handrails on both sides of the stairs and use them. Fix handrails that are broken or loose. Make sure that handrails are as long as the stairways. Check any carpeting to make sure that it is firmly attached to the stairs. Fix any carpet that is loose or worn. Avoid having throw rugs at the top or bottom of the stairs. If you do have throw  rugs, attach them to the floor with carpet tape. Make sure that you have a light switch at the top of the stairs and the bottom of the stairs. If you do not have them, ask someone to add them for you. What else can I do to help prevent falls? Wear shoes that: Do not have high heels. Have rubber bottoms. Are comfortable and fit you well. Are closed at the toe. Do not wear sandals. If you use a stepladder: Make sure that it is fully opened. Do not climb a closed stepladder. Make sure that both sides of the stepladder are locked into place. Ask someone to hold it for you, if possible. Clearly mark and make sure that you can see: Any grab bars or handrails. First and last steps. Where the edge of each step is. Use tools that help you move around (mobility aids) if they are needed. These include: Canes. Walkers. Scooters. Crutches. Turn on the lights when you go into a dark area. Replace any light bulbs as soon as they burn out. Set up your furniture so you have a clear path. Avoid moving your furniture around. If any of your floors are uneven, fix them. If there are any pets around you, be aware of where they are. Review your medicines with your doctor. Some medicines can make you feel dizzy. This can increase your chance of falling. Ask your doctor what other things that you can do to help prevent falls. This information is not intended to replace advice given to you by your health care provider. Make sure you discuss any questions you have with your health care provider. Document Released: 08/30/2009 Document Revised: 04/10/2016 Document Reviewed: 12/08/2014 Elsevier Interactive Patient Education  2017 [employer].

## 2023-01-18 NOTE — Therapy (Incomplete)
OUTPATIENT PHYSICAL THERAPY BALANCE EVALUATION   Patient Name: [name] MRN: [id number] DOB:Sep 12, 1946, 77 y.o., female Today's Date: 01/18/2023  END OF SESSION:   Past Medical History:  Diagnosis Date   Allergy    Arthritis of knee, degenerative 05/08/2015   Basal cell carcinoma 03/2019   nose   Colon cancer ([place]) 2014   Partial colon resection, chemo + rad tx's.    Complication of anesthesia    Diabetes mellitus without complication ([place])    Pt takes Metformin.   Hyperlipidemia    Hypertension    PONV (postoperative nausea and vomiting)    SBO (small bowel obstruction) ([facility]) 04/20/2018   Seizures ([place])    Umbilical hernia with obstruction but no gangrene    Vitamin D deficiency    Past Surgical History:  Procedure Laterality Date   CATARACT EXTRACTION, BILATERAL  01/2020   Dr. Lucianne Lei [facility]   COLOSTOMY REVERSAL  01/2014   ILEOSTOMY  [id number]   UMBILICAL HERNIA REPAIR  05/2018   Patient Active Problem List   Diagnosis Date Noted   Stage 3a chronic kidney disease (CKD) ([place]) 12/30/2022   Trigger finger, left ring finger 12/27/2021   Primary osteoarthritis of both hips 06/10/2019   Bilateral carotid artery stenosis 08/24/2018   Atherosclerosis of aorta ([place]) 08/24/2018   Type II diabetes mellitus with complication ([place]) [id number]   Bradycardia 07/24/2018   Neoplasm of uncertain behavior of skin 01/16/2017   Senile ecchymosis 08/20/2015   Essential (primary) hypertension 05/08/2015   History of rectal cancer 05/08/2015   Hyperlipidemia associated with type 2 diabetes mellitus ([place]) 05/08/2015   Seizure disorder ([place]) 05/08/2015   Shoulder strain 05/08/2015   Avitaminosis D 05/08/2015    PCP: Glean Hess, MD  REFERRING PROVIDER: Glean Hess, MD   REFERRING DIAG: R26.9 (ICD-10-CM) - Gait disturbance   RATIONALE FOR EVALUATION AND TREATMENT: Rehabilitation  THERAPY DIAG: Unsteadiness on feet  ONSET DATE: ***  FOLLOW-UP APPT SCHEDULED WITH  REFERRING PROVIDER: {yes/no:20286}    SUBJECTIVE:                                                                                                                                                                                         SUBJECTIVE STATEMENT:  ***  PERTINENT HISTORY:  Gait imbalance - she feels unsteady on her feet for any distances, uses cart in the grocery.  Thinking about getting a cane.  We had discussed PT several visits ago - she would like to pursue this.      Pain: {yes/no:20286} Numbness/Tingling: {yes/no:20286} Focal Weakness: {yes/no:20286} Recent changes in overall health/medication: {yes/no:20286} Prior history of physical therapy for  balance:  {yes/no:20286} Dominant hand: {RIGHT/LEFT:20294} Imaging: {yes/no:20286}  Red flags: Negative for bowel/bladder changes, saddle paresthesia, personal history of cancer, h/o spinal tumors, h/o compression fx, h/o abdominal aneurysm, abdominal pain, chills/fever, night sweats, nausea, vomiting, unrelenting pain, first onset of insidious LBP <20 y/o  PRECAUTIONS: {Therapy precautions:24002}  WEIGHT BEARING RESTRICTIONS: {Yes ***/No:24003}  FALLS: Has patient fallen in last 6 months? {fallsyesno:27318}, Directional pattern for falls: {Yes ***/No:24003}  Living Environment Lives with: {[facility] lives with:25569::"lives with their family"} Lives in: {Lives in:25570} Stairs: {opstairs:27293} Has following equipment at home: {Assistive devices:23999}  Prior level of function: {PLOF:24004}  Occupational demands:   Hobbies:   Patient Goals: ***   OBJECTIVE:   Patient Surveys  FOTO: ,predicted improvement to  ABC:   Cognition Patient is oriented to person, place, and time.  Recent memory is intact.  Remote memory is intact.  Attention span and concentration are intact.  Expressive speech is intact.  Patient's fund of knowledge is within normal limits for educational level.    Gross Musculoskeletal  Assessment Tremor: None Bulk: Normal Tone: Normal  GAIT: Distance walked: *** Assistive device utilized: {Assistive devices:23999} Level of assistance: {Levels of assistance:24026} Comments: ***  Posture: No gross abnormalities noted in standing or seated posture  AROM AROM (Normal range in degrees) AROM   Lumbar   Flexion (65)   Extension (30)   Right lateral flexion (25)   Left lateral flexion (25)   Right rotation (30)   Left rotation (30)       Hip Right Left  Flexion (125)    Extension (15)    Abduction (40)    Adduction     Internal Rotation (45)    External Rotation (45)        Knee    Flexion (135)    Extension (0)        Ankle    Dorsiflexion (20)    Plantarflexion (50)    Inversion (35)    Eversion (15)    (* = pain; Blank rows = not tested)  LE MMT: MMT (out of 5) Right  Left   Hip flexion    Hip extension    Hip abduction    Hip adduction    Hip internal rotation    Hip external rotation    Knee flexion    Knee extension    Ankle dorsiflexion    Ankle plantarflexion    Ankle inversion    Ankle eversion    (* = pain; Blank rows = not tested)  Sensation Grossly intact to light touch throughout bilateral LEs as determined by testing dermatomes L2-S2. Proprioception, stereognosis, and hot/cold testing deferred on this date.  Reflexes R/L Knee Jerk (L3/4): 2+/2+  Ankle Jerk (S1/2): 2+/2+   Cranial Nerves Visual acuity and visual fields are intact  Extraocular muscles are intact  Facial sensation is intact bilaterally  Facial strength is intact bilaterally  Hearing is normal as tested by gross conversation Palate elevates midline, normal phonation  Shoulder shrug strength is intact  Tongue protrudes midline  Coordination/Cerebellar Finger to Nose: WNL Heel to Shin: WNL Rapid alternating movements: WNL Finger Opposition: WNL Pronator Drift: Negative   FUNCTIONAL OUTCOME MEASURES   Results Comments  BERG /56   DGI /24    FGA /30   TUG seconds   5TSTS seconds   6 Minute Walk Test    10 Meter Gait Speed Self-selected: s = m/s; Fastest: s = m/s   (Blank rows = not tested)  TODAY'S TREATMENT  ***   PATIENT EDUCATION:  Education details: *** Person educated: {Person educated:25204} Education method: {Education Method:25205} Education comprehension: {Education Comprehension:25206}   HOME EXERCISE PROGRAM:    ASSESSMENT:  CLINICAL IMPRESSION: Patient is a *** y.o. *** who was seen today for physical therapy evaluation and treatment for ***.   OBJECTIVE IMPAIRMENTS: {opptimpairments:25111}.   ACTIVITY LIMITATIONS: {activitylimitations:27494}  PARTICIPATION LIMITATIONS: {participationrestrictions:25113}  PERSONAL FACTORS: {Personal factors:25162} are also affecting patient's functional outcome.   REHAB POTENTIAL: {rehabpotential:25112}  CLINICAL DECISION MAKING: {clinical decision making:25114}  EVALUATION COMPLEXITY: {Evaluation complexity:25115}   GOALS: Goals reviewed with patient? {yes/no:20286}  SHORT TERM GOALS: Target date: {follow up:25551}  Pt will be independent with HEP in order to improve strength and balance in order to decrease fall risk and improve function at home. Baseline: *** Goal status: INITIAL   LONG TERM GOALS: Target date: {follow up:25551}  Pt will increase FOTO to at least *** to demonstrate significant improvement in function at home related to balance  Baseline:  Goal status: INITIAL  2.  Pt will improve BERG by at least 3 points in order to demonstrate clinically significant improvement in balance.   Baseline: *** Goal status: INITIAL  3.  Pt will improve ABC by at least 13% in order to demonstrate clinically significant improvement in balance confidence.      Baseline: *** Goal status: INITIAL  4. Pt will decrease 5TSTS by at least 3 seconds in order to demonstrate clinically significant improvement in LE strength      Baseline: *** Goal  status: INITIAL  5. Pt will improve DGI by at least 3 points in order to demonstrate clinically significant improvement in balance and decreased risk for falls.     Baseline: *** Goal status: INITIAL  6. Pt will decrease TUG to below 14 seconds/decrease in order to demonstrate decreased fall risk.  Baseline: *** Goal status: INITIAL  7. Pt will increase 6MWT by at least 105m(1659f in order to demonstrate clinically significant improvement in cardiopulmonary endurance and community ambulation   Baseline: *** Goal status: INITIAL   PLAN: PT FREQUENCY: 2x/week  PT DURATION: {rehab duration:25117}  PLANNED INTERVENTIONS: Therapeutic exercises, Therapeutic activity, Neuromuscular re-education, Balance training, Gait training, Patient/Family education, Self Care, Joint mobilization, Joint manipulation, Vestibular training, Canalith repositioning, Orthotic/Fit training, DME instructions, Dry Needling, Electrical stimulation, Spinal manipulation, Spinal mobilization, Cryotherapy, Moist heat, Taping, Traction, Ultrasound, Ionotophoresis '4mg'$ /ml Dexamethasone, Manual therapy, and Re-evaluation.  PLAN FOR NEXT SESSION: ***   JaLyndel Safeuprich PT, DPT, GCS  Daune Colgate 01/18/2023, 3:18 PM

## 2023-01-20 ENCOUNTER — Ambulatory Visit: Payer: Medicare Other | Attending: Internal Medicine

## 2023-01-20 DIAGNOSIS — R2681 Unsteadiness on feet: Secondary | ICD-10-CM | POA: Diagnosis not present

## 2023-01-20 DIAGNOSIS — R269 Unspecified abnormalities of gait and mobility: Secondary | ICD-10-CM | POA: Diagnosis not present

## 2023-01-20 NOTE — Therapy (Signed)
OUTPATIENT PHYSICAL THERAPY BALANCE EVALUATION   Patient Name: [name] MRN: [id number] DOB:May 01, 1946, 77 y.o., female Today's Date: 01/20/2023  END OF SESSION:  PT End of Session - 01/20/23 1014     Visit Number 1    Number of Visits 17    Date for PT Re-Evaluation 03/17/23    Authorization Type eval: 01/20/23    PT Start Time 1020    PT Stop Time 1100    PT Time Calculation (min) 40 min    Equipment Utilized During Treatment Gait belt    Activity Tolerance Patient tolerated treatment well    Behavior During Therapy WFL for tasks assessed/performed            Past Medical History:  Diagnosis Date   Allergy    Arthritis of knee, degenerative 05/08/2015   Basal cell carcinoma 03/2019   nose   Colon cancer ([place]) 2014   Partial colon resection, chemo + rad tx's.    Complication of anesthesia    Diabetes mellitus without complication ([place])    Pt takes Metformin.   Hyperlipidemia    Hypertension    PONV (postoperative nausea and vomiting)    SBO (small bowel obstruction) ([facility]) 04/20/2018   Seizures ([place])    Umbilical hernia with obstruction but no gangrene    Vitamin D deficiency    Past Surgical History:  Procedure Laterality Date   CATARACT EXTRACTION, BILATERAL  01/2020   Dr. Lucianne Lei [facility]   COLOSTOMY REVERSAL  01/2014   ILEOSTOMY  [id number]   UMBILICAL HERNIA REPAIR  05/2018   Patient Active Problem List   Diagnosis Date Noted   Stage 3a chronic kidney disease (CKD) ([place]) 12/30/2022   Trigger finger, left ring finger 12/27/2021   Primary osteoarthritis of both hips 06/10/2019   Bilateral carotid artery stenosis 08/24/2018   Atherosclerosis of aorta ([place]) 08/24/2018   Type II diabetes mellitus with complication ([place]) [id number]   Bradycardia 07/24/2018   Neoplasm of uncertain behavior of skin 01/16/2017   Senile ecchymosis 08/20/2015   Essential (primary) hypertension 05/08/2015   History of rectal cancer 05/08/2015   Hyperlipidemia associated with type 2  diabetes mellitus ([place]) 05/08/2015   Seizure disorder ([place]) 05/08/2015   Shoulder strain 05/08/2015   Avitaminosis D 05/08/2015   PCP: Glean Hess, MD  REFERRING PROVIDER: Glean Hess, MD   REFERRING DIAG: R26.9 (ICD-10-CM) - Gait disturbance   RATIONALE FOR EVALUATION AND TREATMENT: Rehabilitation  THERAPY DIAG: Unsteadiness on feet  ONSET DATE: 01/17/22 (approximate)  FOLLOW-UP APPT SCHEDULED WITH REFERRING PROVIDER: Yes (in 4 months per patient)   SUBJECTIVE:  SUBJECTIVE STATEMENT:  Unsteadiness  PERTINENT HISTORY:  Pt complains of intermittent difficulty with her balance over the period of multiple months. She fell 6 months ago and fractured multiple ribs. She has noticed when walking across the grass she needs additional support. She uses cart. She reports some urinary and bowel urge incontinence.    Pain: Yes, knee, back, and shoulder pain; Numbness/Tingling: Yes, occasional numbness in L foot and R hand. 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  Focal Weakness: No, generalized LE weakness Recent changes in overall health/medication: No Prior history of physical therapy for balance:  No Dominant hand: left Red flags: Pt reports some nighttime urinary incontinence and some daily bowel issues, history of colon cancer, occasional chills; Denies h/o spinal tumors, h/o compression fx, abdominal pain, fever, night sweats, nausea, vomiting;  PRECAUTIONS: None  WEIGHT BEARING RESTRICTIONS: No  FALLS: Has patient fallen in last 6 months? Yes. Number of falls 1  (a couple falls before that)  Living Environment Lives with: lives with their family, with daughter Lives in: House/apartment, one level Stairs: Yes: External: 3 steps; on right going up Has following equipment at home: Environmental consultant - 2 wheeled, Shower  bench, Grab bars, and handicap height commode  Prior level of function: Independent with basic ADLs, dtr provides some assist IADLs  Occupational demands: Retired, worked in [employer]  Hobbies: [place], watching television, crafts;  Patient Goals: Pt would like to remain independent as long as possible   OBJECTIVE:   Patient Surveys  FOTO: 48, predicted improvement to 26;  ABC: To be completed;  Cognition Patient is oriented to person, place, and time.  Recent memory and remote memory appear slightly impaired; Attention span and concentration are intact.  Expressive speech is intact.  Patient's fund of knowledge is within normal limits for educational level.    Gross Musculoskeletal Assessment Tremor: None Bulk: Normal Tone: Normal  Posture: Forward head and rounded shoulders  LE MMT: MMT (out of 5) Right  Left   Hip flexion 4+ 4+  Hip extension    Hip abduction    Hip adduction    Hip internal rotation    Hip external rotation    Knee flexion 5 5  Knee extension 5 5  Ankle dorsiflexion 4 4  Ankle plantarflexion    Ankle inversion    Ankle eversion    (* = pain; Blank rows = not tested)  Sensation Deferred  Reflexes Deferred  Bed mobility: Deferred testing, pt reports independence but not assess today;  Transfers: Assistive device utilized: None  Sit to stand: Complete Independence Stand to sit: Complete Independence Chair to chair: Complete Independence Floor:  Not tested  Stairs: Level of Assistance: Modified independence Stair Negotiation Technique: Step to Pattern with Bilateral Rails Number of Stairs: 4  Height of Stairs: 6"  Comments: Decreased speed with step-to pattern;  Gait: Gait pattern: step through pattern, decreased step length- Right, and decreased step length- Left Distance walked: 100' Assistive device utilized: None Level of assistance: Complete Independence Comments: Decreased self-selected gait speed  Functional Outcome  Measures  Results Comments  BERG    DGI    FGA    TUG 17.5 seconds   5TSTS 17.3 seconds   6 Minute Walk Test    10 Meter Gait Speed Self-selected: 17.0s = 0.59 m/s; Fastest: 12.4s = 0.81 m/s   (Blank rows = not tested)   TODAY'S TREATMENT  None today   PATIENT EDUCATION:  Education details: Plan of care Person educated: Patient Education method:  Explanation Education comprehension: verbalized understanding   HOME EXERCISE PROGRAM:  None currently   ASSESSMENT:  CLINICAL IMPRESSION: Patient is a 77 y.o. female who was seen today for physical therapy evaluation and treatment for imbalance. Examination reveals increased risk for falls based on TUG, 5TSTS, and 51mgait speed. Will perform additional balance testing at follow-up visit  OBJECTIVE IMPAIRMENTS: decreased balance, difficulty walking, and decreased strength.   ACTIVITY LIMITATIONS: squatting, stairs, and transfers  PARTICIPATION LIMITATIONS: cleaning, laundry, shopping, and community activity  PERSONAL FACTORS: Age, Past/current experiences, Time since onset of injury/illness/exacerbation, and 3+ comorbidities: DM, HTN, and colon CA  are also affecting patient's functional outcome.   REHAB POTENTIAL: Good  CLINICAL DECISION MAKING: Unstable/unpredictable  EVALUATION COMPLEXITY: High   GOALS: Goals reviewed with patient? Yes  SHORT TERM GOALS: Target date: 02/17/2023  Pt will be independent with HEP in order to improve strength and balance in order to decrease fall risk and improve function at home. Baseline:  Goal status: INITIAL   LONG TERM GOALS: Target date: 03/17/2023  Pt will increase FOTO to at least 54 to demonstrate significant improvement in function at home related to balance  Baseline: 01/20/23: 48 Goal status: INITIAL  2.  Pt will improve BERG by at least 3 points in order to demonstrate clinically significant improvement in balance.   Baseline: 01/20/23: To be completed Goal status:  INITIAL  3.  Pt will improve ABC by at least 13% in order to demonstrate clinically significant improvement in balance confidence.      Baseline: 01/20/23: To be completed Goal status: INITIAL  4. Pt will decrease 5TSTS by at least 3 seconds in order to demonstrate clinically significant improvement in LE strength      Baseline: 01/20/23: 17.3s Goal status: INITIAL  5. Pt will decrease TUG to below 14 seconds/decrease in order to demonstrate decreased fall risk.  Baseline: 01/20/23: 17.5s Goal status: INITIAL  PLAN: PT FREQUENCY: 2x/week  PT DURATION: 8 weeks  PLANNED INTERVENTIONS: Therapeutic exercises, Therapeutic activity, Neuromuscular re-education, Balance training, Gait training, Patient/Family education, Self Care, Joint mobilization, Joint manipulation, Vestibular training, Canalith repositioning, Orthotic/Fit training, DME instructions, Dry Needling, Electrical stimulation, Spinal manipulation, Spinal mobilization, Cryotherapy, Moist heat, Taping, Traction, Ultrasound, Ionotophoresis '4mg'$ /ml Dexamethasone, Manual therapy, and Re-evaluation.  PLAN FOR NEXT SESSION: complete ABC, BERG, initiate balance/strength exercises, issue HEP;   JLyndel SafeHuprich PT, DPT, GCS  Lizmarie Witters 01/20/2023, 11:32 AM

## 2023-01-22 ENCOUNTER — Ambulatory Visit: Payer: Medicare Other

## 2023-01-22 DIAGNOSIS — R2681 Unsteadiness on feet: Secondary | ICD-10-CM

## 2023-01-22 DIAGNOSIS — R269 Unspecified abnormalities of gait and mobility: Secondary | ICD-10-CM | POA: Diagnosis not present

## 2023-01-22 NOTE — Therapy (Signed)
OUTPATIENT PHYSICAL THERAPY BALANCE TREATMENT   Patient Name: [name] MRN: [id number] DOB:[birth year], 77 y.o., female Today's Date: 01/22/2023  END OF SESSION:  PT End of Session - 01/22/23 1015     Visit Number 2    Number of Visits 17    Date for PT Re-Evaluation 03/17/23    Authorization Type eval: 01/20/23    PT Start Time 1015    PT Stop Time 1100    PT Time Calculation (min) 45 min    Equipment Utilized During Treatment Gait belt    Activity Tolerance Patient tolerated treatment well    Behavior During Therapy WFL for tasks assessed/performed            Past Medical History:  Diagnosis Date   Allergy    Arthritis of knee, degenerative 05/08/2015   Basal cell carcinoma 03/2019   nose   Colon cancer ([place]) 2014   Partial colon resection, chemo + rad tx's.    Complication of anesthesia    Diabetes mellitus without complication ([place])    Pt takes Metformin.   Hyperlipidemia    Hypertension    PONV (postoperative nausea and vomiting)    SBO (small bowel obstruction) (HCC) 04/20/2018   Seizures ([place])    Umbilical hernia with obstruction but no gangrene    Vitamin D deficiency    Past Surgical History:  Procedure Laterality Date   CATARACT EXTRACTION, BILATERAL  01/2020   Dr. Lucianne Lei [facility]   COLOSTOMY REVERSAL  01/2014   ILEOSTOMY  [id number]   UMBILICAL HERNIA REPAIR  05/2018   Patient Active Problem List   Diagnosis Date Noted   Stage 3a chronic kidney disease (CKD) ([place]) 12/30/2022   Trigger finger, left ring finger 12/27/2021   Primary osteoarthritis of both hips 06/10/2019   Bilateral carotid artery stenosis 08/24/2018   Atherosclerosis of aorta ([place]) 08/24/2018   Type II diabetes mellitus with complication ([place]) [id number]   Bradycardia 07/24/2018   Neoplasm of uncertain behavior of skin 01/16/2017   Senile ecchymosis 08/20/2015   Essential (primary) hypertension 05/08/2015   History of rectal cancer 05/08/2015   Hyperlipidemia associated with type 2  diabetes mellitus ([place]) 05/08/2015   Seizure disorder ([place]) 05/08/2015   Shoulder strain 05/08/2015   Avitaminosis D 05/08/2015   PCP: Glean Hess, MD  REFERRING PROVIDER: Glean Hess, MD   REFERRING DIAG: R26.9 (ICD-10-CM) - Gait disturbance   RATIONALE FOR EVALUATION AND TREATMENT: Rehabilitation  THERAPY DIAG: Unsteadiness on feet  ONSET DATE: 01/17/22 (approximate)  FOLLOW-UP APPT SCHEDULED WITH REFERRING PROVIDER: Yes (in 4 months per patient)  FROM INITIAL EVALUATION (01/20/23) SUBJECTIVE:  SUBJECTIVE STATEMENT:  Unsteadiness  PERTINENT HISTORY:  Pt complains of intermittent difficulty with her balance over the period of multiple months. She fell 6 months ago and fractured multiple ribs. She has noticed when walking across the grass she needs additional support. She uses cart when at the grocery store. She reports some urinary and bowel urge incontinence.    Pain: Yes, knee, back, and shoulder pain; Numbness/Tingling: Yes, occasional numbness in L foot and R hand. 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  Focal Weakness: No, generalized LE weakness Recent changes in overall health/medication: No Prior history of physical therapy for balance:  No Dominant hand: left Red flags: Pt reports some nighttime urinary incontinence and some daily bowel issues, history of colon cancer, occasional chills; Denies h/o spinal tumors, h/o compression fx, abdominal pain, fever, night sweats, nausea, vomiting;  PRECAUTIONS: None  WEIGHT BEARING RESTRICTIONS: No  FALLS: Has patient fallen in last 6 months? Yes. Number of falls 1  (a couple falls before that)  Living Environment Lives with: lives with their family, with daughter Lives in: House/apartment, one level Stairs: Yes: External: 3 steps; on right going up Has  following equipment at home: Environmental consultant - 2 wheeled, Shower bench, Grab bars, and handicap height commode  Prior level of function: Independent with basic ADLs, dtr provides some assist IADLs  Occupational demands: Retired, worked in [employer]  Hobbies: [place], watching television, crafts;  Patient Goals: Pt would like to remain independent as long as possible   OBJECTIVE:   Patient Surveys  FOTO: 48, predicted improvement to 42;  ABC: To be completed;  Cognition Patient is oriented to person, place, and time.  Recent memory and remote memory appear slightly impaired; Attention span and concentration are intact.  Expressive speech is intact.  Patient's fund of knowledge is within normal limits for educational level.    Gross Musculoskeletal Assessment Tremor: None Bulk: Normal Tone: Normal  Posture: Forward head and rounded shoulders  LE MMT: MMT (out of 5) Right  Left   Hip flexion 4+ 4+  Hip extension    Hip abduction    Hip adduction    Hip internal rotation    Hip external rotation    Knee flexion 5 5  Knee extension 5 5  Ankle dorsiflexion 4 4  Ankle plantarflexion    Ankle inversion    Ankle eversion    (* = pain; Blank rows = not tested)  Sensation Deferred  Reflexes Deferred  Bed mobility: Deferred testing, pt reports independence but not assess today;  Transfers: Assistive device utilized: None  Sit to stand: Complete Independence Stand to sit: Complete Independence Chair to chair: Complete Independence Floor:  Not tested  Stairs: Level of Assistance: Modified independence Stair Negotiation Technique: Step to Pattern with Bilateral Rails Number of Stairs: 4  Height of Stairs: 6"  Comments: Decreased speed with step-to pattern;  Gait: Gait pattern: step through pattern, decreased step length- Right, and decreased step length- Left Distance walked: 100' Assistive device utilized: None Level of assistance: Complete Independence Comments:  Decreased self-selected gait speed  Functional Outcome Measures  Results Comments  BERG    DGI    FGA    TUG 17.5 seconds   5TSTS 17.3 seconds   6 Minute Walk Test    10 Meter Gait Speed Self-selected: 17.0s = 0.59 m/s; Fastest: 12.4s = 0.81 m/s   (Blank rows = not tested)   TODAY'S TREATMENT    SUBJECTIVE: Pt reports that she is doing well today. She  denies any particular pain upon arrival. No updates since the initial evaluation. No specific questions or concerns.   PAIN: Denies any particular pain upon arrival;   Ther-ex  Sit to stand without UE support 2 x 10;  Seated LAQ with 4# ankle weights x 10 BLE; Seated clams with manual resistance from therapist x 10 BLE; Seated adductor squeezes with manual resistance from therapist x 10 BLE;  Standing hip strengthening with 4# ankle weights: Hip flexion marches x 10 BLE; HS curls x 10 BLE; Hip abduction x 10 BLE; Hip extension x 10 BLE;  Standing heel/toe raises with BUE support 2 x 10 BLE; NuStep L1-2 x 6 minutes for LE strengthening with therapist adjusting resistance and monitoring fatigue; HEP education provided to patient   Neuromuscular Re-education  All exercises performed in // bars without UE support unless otherwise specified; BERG: 42/56; Airex feet together eyes open/closed x 30s each; Airex feet together eyes open horizontal and vertical head turns x 30s each;  Airex alternating 6" step taps x 10 BLE; Forward 6" step ups alternating leading LE x 5 on each side;   PATIENT EDUCATION:  Education details: Pt educated throughout session about proper posture and technique with exercises. Improved exercise technique, movement at target joints, use of target muscles after min to mod verbal, visual, tactile cues. Balance exercises, HEP; Person educated: Patient Education method: Explanation, Demonstration, Verbal cues, and Handouts Education comprehension: verbalized understanding, returned demonstration, and  verbal cues required   HOME EXERCISE PROGRAM:  Access Code: [email or web address] URL: [email or web address] Date: 01/22/2023 Prepared by: Roxana Hires  Exercises - Sit to Stand Without Arm Support  - 1 x daily - 7 x weekly - 2 sets - 10 reps - Standing Hip Abduction with Counter Support  - 1 x daily - 7 x weekly - 2 sets - 10 reps - 3s hold - Heel Raises with Counter Support  - 1 x daily - 7 x weekly - 2 sets - 10 reps - 3s hold - Standing March with Counter Support  - 1 x daily - 7 x weekly - 2 sets - 10 reps - 3s hold - Corner Balance Feet Together: Eyes Open With Head Turns  - 1 x daily - 7 x weekly - 3 reps - 30s hold   ASSESSMENT:  CLINICAL IMPRESSION: Patient demonstrates excellent motivation during session today. Perform BERG balance test and pt scored 42/56 indicating notable balance deficits. Initiated strengthening and balance exercises with patient during session. She requires intermittent seated rest breaks due to fatigue. Issued HEP and education provided. Plan to progress strengthening and balance exercises during futures sessions. Pt encouraged to follow-up as scheduled. Pt will benefit from PT services to address deficits in strength, balance, and mobility in order to return to full function at home and decrease her risk for falls.   OBJECTIVE IMPAIRMENTS: decreased balance, difficulty walking, and decreased strength.   ACTIVITY LIMITATIONS: squatting, stairs, and transfers  PARTICIPATION LIMITATIONS: cleaning, laundry, shopping, and community activity  PERSONAL FACTORS: Age, Past/current experiences, Time since onset of injury/illness/exacerbation, and 3+ comorbidities: DM, HTN, and colon CA  are also affecting patient's functional outcome.   REHAB POTENTIAL: Good  CLINICAL DECISION MAKING: Unstable/unpredictable  EVALUATION COMPLEXITY: High   GOALS: Goals reviewed with patient? Yes  SHORT TERM GOALS: Target date: 02/17/2023  Pt will be independent with HEP  in order to improve strength and balance in order to decrease fall risk and improve function at home. Baseline:  Goal status: INITIAL  LONG TERM GOALS: Target date: 03/17/2023  Pt will increase FOTO to at least 54 to demonstrate significant improvement in function at home related to balance  Baseline: 01/20/23: 48 Goal status: INITIAL  2.  Pt will improve BERG by at least 3 points in order to demonstrate clinically significant improvement in balance.   Baseline: 01/20/23: To be completed; 01/22/23: 42/56; Goal status: INITIAL  3.  Pt will improve ABC by at least 13% in order to demonstrate clinically significant improvement in balance confidence.      Baseline: 01/20/23: To be completed; 01/22/23: 34.4%; Goal status: INITIAL  4. Pt will decrease 5TSTS by at least 3 seconds in order to demonstrate clinically significant improvement in LE strength      Baseline: 01/20/23: 17.3s Goal status: INITIAL  5. Pt will decrease TUG to below 14 seconds/decrease in order to demonstrate decreased fall risk.  Baseline: 01/20/23: 17.5s Goal status: INITIAL  PLAN: PT FREQUENCY: 2x/week  PT DURATION: 8 weeks  PLANNED INTERVENTIONS: Therapeutic exercises, Therapeutic activity, Neuromuscular re-education, Balance training, Gait training, Patient/Family education, Self Care, Joint mobilization, Joint manipulation, Vestibular training, Canalith repositioning, Orthotic/Fit training, DME instructions, Dry Needling, Electrical stimulation, Spinal manipulation, Spinal mobilization, Cryotherapy, Moist heat, Taping, Traction, Ultrasound, Ionotophoresis '4mg'$ /ml Dexamethasone, Manual therapy, and Re-evaluation.  PLAN FOR NEXT SESSION: progress balance/strength exercises, review/modify HEP as needed;   Lyndel Safe Ileta Ofarrell PT, DPT, GCS  Teyanna Thielman 01/22/2023, 11:36 AM

## 2023-01-23 NOTE — Therapy (Signed)
OUTPATIENT PHYSICAL THERAPY BALANCE TREATMENT   Patient Name: ADONIS BLOUGH MRN: [id number] DOB:1946/02/10, 77 y.o., female Today's Date: 01/23/2023  END OF SESSION:   Past Medical History:  Diagnosis Date   Allergy    Arthritis of knee, degenerative 05/08/2015   Basal cell carcinoma 03/2019   nose   Colon cancer ([place]) 2014   Partial colon resection, chemo + rad tx's.    Complication of anesthesia    Diabetes mellitus without complication ([place])    Pt takes Metformin.   Hyperlipidemia    Hypertension    PONV (postoperative nausea and vomiting)    SBO (small bowel obstruction) (HCC) 04/20/2018   Seizures ([place])    Umbilical hernia with obstruction but no gangrene    Vitamin D deficiency    Past Surgical History:  Procedure Laterality Date   CATARACT EXTRACTION, BILATERAL  01/2020   Dr. Lucianne Lei [facility]   COLOSTOMY REVERSAL  01/2014   ILEOSTOMY  [id number]   UMBILICAL HERNIA REPAIR  05/2018   Patient Active Problem List   Diagnosis Date Noted   Stage 3a chronic kidney disease (CKD) ([place]) 12/30/2022   Trigger finger, left ring finger 12/27/2021   Primary osteoarthritis of both hips 06/10/2019   Bilateral carotid artery stenosis 08/24/2018   Atherosclerosis of aorta ([place]) 08/24/2018   Type II diabetes mellitus with complication ([place]) [id number]   Bradycardia 07/24/2018   Neoplasm of uncertain behavior of skin 01/16/2017   Senile ecchymosis 08/20/2015   Essential (primary) hypertension 05/08/2015   History of rectal cancer 05/08/2015   Hyperlipidemia associated with type 2 diabetes mellitus ([place]) 05/08/2015   Seizure disorder ([place]) 05/08/2015   Shoulder strain 05/08/2015   Avitaminosis D 05/08/2015   PCP: Glean Hess, MD  REFERRING PROVIDER: Glean Hess, MD   REFERRING DIAG: R26.9 (ICD-10-CM) - Gait disturbance   RATIONALE FOR EVALUATION AND TREATMENT: Rehabilitation  THERAPY DIAG: Unsteadiness on feet  ONSET DATE: 01/17/22 (approximate)  FOLLOW-UP APPT  SCHEDULED WITH REFERRING PROVIDER: Yes (in 4 months per patient)  FROM INITIAL EVALUATION (01/20/23) SUBJECTIVE:                                                                                                                                                                                         SUBJECTIVE STATEMENT:  Unsteadiness  PERTINENT HISTORY:  Pt complains of intermittent difficulty with her balance over the period of multiple months. She fell 6 months ago and fractured multiple ribs. She has noticed when walking across the grass she needs additional support. She uses cart when at the grocery store. She reports some urinary and bowel urge incontinence.    Pain:  Yes, knee, back, and shoulder pain; Numbness/Tingling: Yes, occasional numbness in L foot and R hand. 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  Focal Weakness: No, generalized LE weakness Recent changes in overall health/medication: No Prior history of physical therapy for balance:  No Dominant hand: left Red flags: Pt reports some nighttime urinary incontinence and some daily bowel issues, history of colon cancer, occasional chills; Denies h/o spinal tumors, h/o compression fx, abdominal pain, fever, night sweats, nausea, vomiting;  PRECAUTIONS: None  WEIGHT BEARING RESTRICTIONS: No  FALLS: Has patient fallen in last 6 months? Yes. Number of falls 1  (a couple falls before that)  Living Environment Lives with: lives with their family, with daughter Lives in: House/apartment, one level Stairs: Yes: External: 3 steps; on right going up Has following equipment at home: Environmental consultant - 2 wheeled, Shower bench, Grab bars, and handicap height commode  Prior level of function: Independent with basic ADLs, dtr provides some assist IADLs  Occupational demands: Retired, worked in [employer]  Hobbies: [employer], watching television, crafts;  Patient Goals: Pt would like to remain independent as long as possible   OBJECTIVE:   Patient Surveys  FOTO: 48,  predicted improvement to 105;  ABC: To be completed;  Cognition Patient is oriented to person, place, and time.  Recent memory and remote memory appear slightly impaired; Attention span and concentration are intact.  Expressive speech is intact.  Patient's fund of knowledge is within normal limits for educational level.    Gross Musculoskeletal Assessment Tremor: None Bulk: Normal Tone: Normal  Posture: Forward head and rounded shoulders  LE MMT: MMT (out of 5) Right  Left   Hip flexion 4+ 4+  Hip extension    Hip abduction    Hip adduction    Hip internal rotation    Hip external rotation    Knee flexion 5 5  Knee extension 5 5  Ankle dorsiflexion 4 4  Ankle plantarflexion    Ankle inversion    Ankle eversion    (* = pain; Blank rows = not tested)  Sensation Deferred  Reflexes Deferred  Bed mobility: Deferred testing, pt reports independence but not assess today;  Transfers: Assistive device utilized: None  Sit to stand: Complete Independence Stand to sit: Complete Independence Chair to chair: Complete Independence Floor:  Not tested  Stairs: Level of Assistance: Modified independence Stair Negotiation Technique: Step to Pattern with Bilateral Rails Number of Stairs: 4  Height of Stairs: 6"  Comments: Decreased speed with step-to pattern;  Gait: Gait pattern: step through pattern, decreased step length- Right, and decreased step length- Left Distance walked: 100' Assistive device utilized: None Level of assistance: Complete Independence Comments: Decreased self-selected gait speed  Functional Outcome Measures  Results Comments  BERG    DGI    FGA    TUG 17.5 seconds   5TSTS 17.3 seconds   6 Minute Walk Test    10 Meter Gait Speed Self-selected: 17.0s = 0.59 m/s; Fastest: 12.4s = 0.81 m/s   (Blank rows = not tested)   TODAY'S TREATMENT    SUBJECTIVE: Pt reports that she is doing well today. She denies any particular pain upon arrival. No  updates since the initial evaluation. No specific questions or concerns.   PAIN: Denies any particular pain upon arrival;   Ther-ex  Sit to stand without UE support 2 x 10;  Seated LAQ with 4# ankle weights x 10 BLE; Seated clams with manual resistance from therapist x 10 BLE; Seated adductor squeezes with  manual resistance from therapist x 10 BLE;  Standing hip strengthening with 4# ankle weights: Hip flexion marches x 10 BLE; HS curls x 10 BLE; Hip abduction x 10 BLE; Hip extension x 10 BLE;  Standing heel/toe raises with BUE support 2 x 10 BLE; NuStep L1-2 x 6 minutes for LE strengthening with therapist adjusting resistance and monitoring fatigue; HEP education provided to patient   Neuromuscular Re-education  All exercises performed in // bars without UE support unless otherwise specified; BERG: 42/56; Airex feet together eyes open/closed x 30s each; Airex feet together eyes open horizontal and vertical head turns x 30s each;  Airex alternating 6" step taps x 10 BLE; Forward 6" step ups alternating leading LE x 5 on each side;   PATIENT EDUCATION:  Education details: Pt educated throughout session about proper posture and technique with exercises. Improved exercise technique, movement at target joints, use of target muscles after min to mod verbal, visual, tactile cues. Balance exercises, HEP; Person educated: Patient Education method: Explanation, Demonstration, Verbal cues, and Handouts Education comprehension: verbalized understanding, returned demonstration, and verbal cues required   HOME EXERCISE PROGRAM:  Access Code: [email or web address] URL: [email or web address] Date: 01/22/2023 Prepared by: Roxana Hires  Exercises - Sit to Stand Without Arm Support  - 1 x daily - 7 x weekly - 2 sets - 10 reps - Standing Hip Abduction with Counter Support  - 1 x daily - 7 x weekly - 2 sets - 10 reps - 3s hold - Heel Raises with Counter Support  - 1 x daily - 7 x  weekly - 2 sets - 10 reps - 3s hold - Standing March with Counter Support  - 1 x daily - 7 x weekly - 2 sets - 10 reps - 3s hold - Corner Balance Feet Together: Eyes Open With Head Turns  - 1 x daily - 7 x weekly - 3 reps - 30s hold   ASSESSMENT:  CLINICAL IMPRESSION: Patient demonstrates excellent motivation during session today. Perform BERG balance test and pt scored 42/56 indicating notable balance deficits. Initiated strengthening and balance exercises with patient during session. She requires intermittent seated rest breaks due to fatigue. Issued HEP and education provided. Plan to progress strengthening and balance exercises during futures sessions. Pt encouraged to follow-up as scheduled. Pt will benefit from PT services to address deficits in strength, balance, and mobility in order to return to full function at home and decrease her risk for falls.   OBJECTIVE IMPAIRMENTS: decreased balance, difficulty walking, and decreased strength.   ACTIVITY LIMITATIONS: squatting, stairs, and transfers  PARTICIPATION LIMITATIONS: cleaning, laundry, shopping, and community activity  PERSONAL FACTORS: Age, Past/current experiences, Time since onset of injury/illness/exacerbation, and 3+ comorbidities: DM, HTN, and colon CA  are also affecting patient's functional outcome.   REHAB POTENTIAL: Good  CLINICAL DECISION MAKING: Unstable/unpredictable  EVALUATION COMPLEXITY: High   GOALS: Goals reviewed with patient? Yes  SHORT TERM GOALS: Target date: 02/17/2023  Pt will be independent with HEP in order to improve strength and balance in order to decrease fall risk and improve function at home. Baseline:  Goal status: INITIAL   LONG TERM GOALS: Target date: 03/17/2023  Pt will increase FOTO to at least 54 to demonstrate significant improvement in function at home related to balance  Baseline: 01/20/23: 48 Goal status: INITIAL  2.  Pt will improve BERG by at least 3 points in order to  demonstrate clinically significant improvement in balance.   Baseline: 01/20/23: To be completed; 01/22/23:  42/56; Goal status: INITIAL  3.  Pt will improve ABC by at least 13% in order to demonstrate clinically significant improvement in balance confidence.      Baseline: 01/20/23: To be completed; 01/22/23: 34.4%; Goal status: INITIAL  4. Pt will decrease 5TSTS by at least 3 seconds in order to demonstrate clinically significant improvement in LE strength      Baseline: 01/20/23: 17.3s Goal status: INITIAL  5. Pt will decrease TUG to below 14 seconds/decrease in order to demonstrate decreased fall risk.  Baseline: 01/20/23: 17.5s Goal status: INITIAL  PLAN: PT FREQUENCY: 2x/week  PT DURATION: 8 weeks  PLANNED INTERVENTIONS: Therapeutic exercises, Therapeutic activity, Neuromuscular re-education, Balance training, Gait training, Patient/Family education, Self Care, Joint mobilization, Joint manipulation, Vestibular training, Canalith repositioning, Orthotic/Fit training, DME instructions, Dry Needling, Electrical stimulation, Spinal manipulation, Spinal mobilization, Cryotherapy, Moist heat, Taping, Traction, Ultrasound, Ionotophoresis '4mg'$ /ml Dexamethasone, Manual therapy, and Re-evaluation.  PLAN FOR NEXT SESSION: progress balance/strength exercises, review/modify HEP as needed;   Lyndel Safe Keland Peyton PT, DPT, GCS  Bear Osten 01/23/2023, 8:39 AM

## 2023-01-27 ENCOUNTER — Ambulatory Visit: Payer: Medicare Other

## 2023-01-27 DIAGNOSIS — R269 Unspecified abnormalities of gait and mobility: Secondary | ICD-10-CM | POA: Diagnosis not present

## 2023-01-27 DIAGNOSIS — R2681 Unsteadiness on feet: Secondary | ICD-10-CM | POA: Diagnosis not present

## 2023-01-30 ENCOUNTER — Ambulatory Visit: Payer: Medicare Other

## 2023-01-30 DIAGNOSIS — R2681 Unsteadiness on feet: Secondary | ICD-10-CM

## 2023-01-30 DIAGNOSIS — R269 Unspecified abnormalities of gait and mobility: Secondary | ICD-10-CM | POA: Diagnosis not present

## 2023-01-30 NOTE — Therapy (Signed)
OUTPATIENT PHYSICAL THERAPY BALANCE TREATMENT   Patient Name: [name] MRN: [id number] DOB:1946/08/25, 77 y.o., female Today's Date: 01/30/2023  END OF SESSION:  PT End of Session - 01/30/23 0841     Visit Number 4    Number of Visits 17    Date for PT Re-Evaluation 03/17/23    Authorization Type eval: 01/20/23    PT Start Time 0845    PT Stop Time 0930    PT Time Calculation (min) 45 min    Equipment Utilized During Treatment Gait belt    Activity Tolerance Patient tolerated treatment well    Behavior During Therapy WFL for tasks assessed/performed            Past Medical History:  Diagnosis Date   Allergy    Arthritis of knee, degenerative 05/08/2015   Basal cell carcinoma 03/2019   nose   Colon cancer ([place]) 2014   Partial colon resection, chemo + rad tx's.    Complication of anesthesia    Diabetes mellitus without complication ([place])    Pt takes Metformin.   Hyperlipidemia    Hypertension    PONV (postoperative nausea and vomiting)    SBO (small bowel obstruction) (HCC) 04/20/2018   Seizures ([place])    Umbilical hernia with obstruction but no gangrene    Vitamin D deficiency    Past Surgical History:  Procedure Laterality Date   CATARACT EXTRACTION, BILATERAL  01/2020   Dr. Lucianne Lei [facility]   COLOSTOMY REVERSAL  01/2014   ILEOSTOMY  [id number]   UMBILICAL HERNIA REPAIR  05/2018   Patient Active Problem List   Diagnosis Date Noted   Stage 3a chronic kidney disease (CKD) ([place]) 12/30/2022   Trigger finger, left ring finger 12/27/2021   Primary osteoarthritis of both hips 06/10/2019   Bilateral carotid artery stenosis 08/24/2018   Atherosclerosis of aorta ([place]) 08/24/2018   Type II diabetes mellitus with complication ([place]) [id number]   Bradycardia 07/24/2018   Neoplasm of uncertain behavior of skin 01/16/2017   Senile ecchymosis 08/20/2015   Essential (primary) hypertension 05/08/2015   History of rectal cancer 05/08/2015   Hyperlipidemia associated with type 2  diabetes mellitus ([place]) 05/08/2015   Seizure disorder ([place]) 05/08/2015   Shoulder strain 05/08/2015   Avitaminosis D 05/08/2015   PCP: Glean Hess, MD  REFERRING PROVIDER: Glean Hess, MD   REFERRING DIAG: R26.9 (ICD-10-CM) - Gait disturbance   RATIONALE FOR EVALUATION AND TREATMENT: Rehabilitation  THERAPY DIAG: Unsteadiness on feet  ONSET DATE: 01/17/22 (approximate)  FOLLOW-UP APPT SCHEDULED WITH REFERRING PROVIDER: Yes (in 4 months per patient)  FROM INITIAL EVALUATION (01/20/23) SUBJECTIVE:  SUBJECTIVE STATEMENT:  Unsteadiness  PERTINENT HISTORY:  Pt complains of intermittent difficulty with her balance over the period of multiple months. She fell 6 months ago and fractured multiple ribs. She has noticed when walking across the grass she needs additional support. She uses cart when at the grocery store. She reports some urinary and bowel urge incontinence.    Pain: Yes, knee, back, and shoulder pain; Numbness/Tingling: Yes, occasional numbness in L foot and R hand. 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  Focal Weakness: No, generalized LE weakness Recent changes in overall health/medication: No Prior history of physical therapy for balance:  No Dominant hand: left Red flags: Pt reports some nighttime urinary incontinence and some daily bowel issues, history of colon cancer, occasional chills; Denies h/o spinal tumors, h/o compression fx, abdominal pain, fever, night sweats, nausea, vomiting;  PRECAUTIONS: None  WEIGHT BEARING RESTRICTIONS: No  FALLS: Has patient fallen in last 6 months? Yes. Number of falls 1  (a couple falls before that)  Living Environment Lives with: lives with their family, with daughter Lives in: House/apartment, one level Stairs: Yes: External: 3 steps; on right going up Has  following equipment at home: Environmental consultant - 2 wheeled, Shower bench, Grab bars, and handicap height commode  Prior level of function: Independent with basic ADLs, dtr provides some assist IADLs  Occupational demands: Retired, worked in [employer]  Hobbies: [place], watching television, crafts;  Patient Goals: Pt would like to remain independent as long as possible  OBJECTIVE:  Patient Surveys  FOTO: 48, predicted improvement to 73;  ABC: To be completed;  Cognition Patient is oriented to person, place, and time.  Recent memory and remote memory appear slightly impaired; Attention span and concentration are intact.  Expressive speech is intact.  Patient's fund of knowledge is within normal limits for educational level.    Gross Musculoskeletal Assessment Tremor: None Bulk: Normal Tone: Normal  Posture: Forward head and rounded shoulders  LE MMT: MMT (out of 5) Right  Left   Hip flexion 4+ 4+  Hip extension    Hip abduction    Hip adduction    Hip internal rotation    Hip external rotation    Knee flexion 5 5  Knee extension 5 5  Ankle dorsiflexion 4 4  Ankle plantarflexion    Ankle inversion    Ankle eversion    (* = pain; Blank rows = not tested)  Transfers: Assistive device utilized: None  Sit to stand: Complete Independence Stand to sit: Complete Independence Chair to chair: Complete Independence Floor:  Not tested  Stairs: Level of Assistance: Modified independence Stair Negotiation Technique: Step to Pattern with Bilateral Rails Number of Stairs: 4  Height of Stairs: 6"  Comments: Decreased speed with step-to pattern;  Gait: Gait pattern: step through pattern, decreased step length- Right, and decreased step length- Left Distance walked: 100' Assistive device utilized: None Level of assistance: Complete Independence Comments: Decreased self-selected gait speed  Functional Outcome Measures  01/20/23 01/22/23 Comments  BERG  42/56 Increased risk for fall, in  need of intervention  DGI     FGA     TUG 17.5 seconds  Increased risk for fall, in need of intervention  5TSTS 17.3 seconds  Increased risk for fall, in need of intervention  6 Minute Walk Test     10 Meter Gait Speed Self-selected: 17.0s = 0.59 m/s; Fastest: 12.4s = 0.81 m/s  Below threshold for full community mobility  (Blank rows = not tested)  TODAY'S TREATMENT    SUBJECTIVE: Pt reports that she is doing well today. Neck and R knee pain are improved. No specific pain reported upon arrival. No updates since the last therapy session. No recent falls. No specific questions or concerns.   PAIN: Denies   Ther-ex  NuStep L1-4 x 10 minutes for LE strengthening with therapist adjusting resistance and monitoring fatigue; Forward 6" step ups alternating leading LE x 10 on each side; Lateral 6" step ups x 10 toward each side; Sit to stand with 6# overhead med ball press 2 x 10; Seated LAQ with 5# ankle weights 2 x 10 BLE;  Standing hip strengthening with 4# ankle weights in // bars: Hip flexion marches x 10 BLE; HS curls x 10 BLE; Hip abduction x 10 BLE; Hip extension x 10 BLE;  Standing heel/toe raises with BUE support x 10 BLE; Side stepping with 5# AW x 6 lengths; Seated clams with manual resistance from therapist x 10 BLE; Seated adductor squeezes with manual resistance from therapist x 10 BLE;   Not performed: Airex feet together eyes open/closed x 30s each; Airex feet together eyes open x 30s each;  Airex alternating 6" step taps x 10 BLE;   PATIENT EDUCATION:  Education details: Pt educated throughout session about proper posture and technique with exercises. Improved exercise technique, movement at target joints, use of target muscles after min to mod verbal, visual, tactile cues. Balance exercises Person educated: Patient Education method: Explanation, Demonstration, Verbal cues, and Handouts Education comprehension: verbalized understanding, returned  demonstration, and verbal cues required   HOME EXERCISE PROGRAM:  Access Code: [email or web address] URL: [email or web address] Date: 01/22/2023 Prepared by: Roxana Hires  Exercises - Sit to Stand Without Arm Support  - 1 x daily - 7 x weekly - 2 sets - 10 reps - Standing Hip Abduction with Counter Support  - 1 x daily - 7 x weekly - 2 sets - 10 reps - 3s hold - Heel Raises with Counter Support  - 1 x daily - 7 x weekly - 2 sets - 10 reps - 3s hold - Standing March with Counter Support  - 1 x daily - 7 x weekly - 2 sets - 10 reps - 3s hold - Corner Balance Feet Together: Eyes Open With Head Turns  - 1 x daily - 7 x weekly - 3 reps - 30s hold   ASSESSMENT:  CLINICAL IMPRESSION: Patient demonstrates excellent motivation during session today. Progressed strengthening during session today. She is able to complete all strength exercises, but continues to require intermittent rest breaks d/t fatigue. No LOB noted during therapeutic interventions this session. Pt will benefit from PT services to address deficits in strength, balance, and mobility in order to return to full function at home and decrease her risk for falls.   OBJECTIVE IMPAIRMENTS: decreased balance, difficulty walking, and decreased strength.   ACTIVITY LIMITATIONS: squatting, stairs, and transfers  PARTICIPATION LIMITATIONS: cleaning, laundry, shopping, and community activity  PERSONAL FACTORS: Age, Past/current experiences, Time since onset of injury/illness/exacerbation, and 3+ comorbidities: DM, HTN, and colon CA  are also affecting patient's functional outcome.   REHAB POTENTIAL: Good  CLINICAL DECISION MAKING: Unstable/unpredictable  EVALUATION COMPLEXITY: High   GOALS: Goals reviewed with patient? Yes  SHORT TERM GOALS: Target date: 02/17/2023  Pt will be independent with HEP in order to improve strength and balance in order to decrease fall risk and improve function at home. Baseline:  Goal status:  INITIAL   LONG TERM GOALS: Target  date: 03/17/2023  Pt will increase FOTO to at least 54 to demonstrate significant improvement in function at home related to balance  Baseline: 01/20/23: 48 Goal status: INITIAL  2.  Pt will improve BERG by at least 3 points in order to demonstrate clinically significant improvement in balance.   Baseline: 01/20/23: To be completed; 01/22/23: 42/56; Goal status: INITIAL  3.  Pt will improve ABC by at least 13% in order to demonstrate clinically significant improvement in balance confidence.      Baseline: 01/20/23: To be completed; 01/22/23: 34.4%; Goal status: INITIAL  4. Pt will decrease 5TSTS by at least 3 seconds in order to demonstrate clinically significant improvement in LE strength      Baseline: 01/20/23: 17.3s Goal status: INITIAL  5. Pt will decrease TUG to below 14 seconds/decrease in order to demonstrate decreased fall risk.  Baseline: 01/20/23: 17.5s Goal status: INITIAL  PLAN: PT FREQUENCY: 2x/week  PT DURATION: 8 weeks  PLANNED INTERVENTIONS: Therapeutic exercises, Therapeutic activity, Neuromuscular re-education, Balance training, Gait training, Patient/Family education, Self Care, Joint mobilization, Joint manipulation, Vestibular training, Canalith repositioning, Orthotic/Fit training, DME instructions, Dry Needling, Electrical stimulation, Spinal manipulation, Spinal mobilization, Cryotherapy, Moist heat, Taping, Traction, Ultrasound, Ionotophoresis 4mg /ml Dexamethasone, Manual therapy, and Re-evaluation.  PLAN FOR NEXT SESSION: progress balance/strength exercises, review/modify HEP as needed;   Lyndel Safe Kevan Prouty PT, DPT, GCS  [name] 01/30/2023, 10:47 AM

## 2023-02-02 NOTE — Therapy (Signed)
OUTPATIENT PHYSICAL THERAPY BALANCE TREATMENT   Patient Name: [name] MRN: [id number] DOB:1945/12/02, 77 y.o., female Today's Date: 02/03/2023  END OF SESSION:  PT End of Session - 02/03/23 0927     Visit Number 5    Number of Visits 17    Date for PT Re-Evaluation 03/17/23    Authorization Type eval: 01/20/23    PT Start Time 0932    PT Stop Time 1015    PT Time Calculation (min) 43 min    Equipment Utilized During Treatment Gait belt    Activity Tolerance Patient tolerated treatment well    Behavior During Therapy WFL for tasks assessed/performed            Past Medical History:  Diagnosis Date   Allergy    Arthritis of knee, degenerative 05/08/2015   Basal cell carcinoma 03/2019   nose   Colon cancer ([place]) 2014   Partial colon resection, chemo + rad tx's.    Complication of anesthesia    Diabetes mellitus without complication ([place])    Pt takes Metformin.   Hyperlipidemia    Hypertension    PONV (postoperative nausea and vomiting)    SBO (small bowel obstruction) ([facility]) 04/20/2018   Seizures ([place])    Umbilical hernia with obstruction but no gangrene    Vitamin D deficiency    Past Surgical History:  Procedure Laterality Date   CATARACT EXTRACTION, BILATERAL  01/2020   Dr. Lucianne Lei [facility]   COLOSTOMY REVERSAL  01/2014   ILEOSTOMY  [id number]   UMBILICAL HERNIA REPAIR  05/2018   Patient Active Problem List   Diagnosis Date Noted   Stage 3a chronic kidney disease (CKD) ([place]) 12/30/2022   Trigger finger, left ring finger 12/27/2021   Primary osteoarthritis of both hips 06/10/2019   Bilateral carotid artery stenosis 08/24/2018   Atherosclerosis of aorta ([place]) 08/24/2018   Type II diabetes mellitus with complication ([place]) [id number]   Bradycardia 07/24/2018   Neoplasm of uncertain behavior of skin 01/16/2017   Senile ecchymosis 08/20/2015   Essential (primary) hypertension 05/08/2015   History of rectal cancer 05/08/2015   Hyperlipidemia associated with type 2  diabetes mellitus ([place]) 05/08/2015   Seizure disorder ([place]) 05/08/2015   Shoulder strain 05/08/2015   Avitaminosis D 05/08/2015   PCP: Glean Hess, MD  REFERRING PROVIDER: Glean Hess, MD   REFERRING DIAG: R26.9 (ICD-10-CM) - Gait disturbance   RATIONALE FOR EVALUATION AND TREATMENT: Rehabilitation  THERAPY DIAG: Unsteadiness on feet  ONSET DATE: 01/17/22 (approximate)  FOLLOW-UP APPT SCHEDULED WITH REFERRING PROVIDER: Yes (in 4 months per patient)  FROM INITIAL EVALUATION (01/20/23) SUBJECTIVE:  SUBJECTIVE STATEMENT:  Unsteadiness  PERTINENT HISTORY:  Pt complains of intermittent difficulty with her balance over the period of multiple months. She fell 6 months ago and fractured multiple ribs. She has noticed when walking across the grass she needs additional support. She uses cart when at the grocery store. She reports some urinary and bowel urge incontinence.    Pain: Yes, knee, back, and shoulder pain; Numbness/Tingling: Yes, occasional numbness in L foot and R hand. 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  Focal Weakness: No, generalized LE weakness Recent changes in overall health/medication: No Prior history of physical therapy for balance:  No Dominant hand: left Red flags: Pt reports some nighttime urinary incontinence and some daily bowel issues, history of colon cancer, occasional chills; Denies h/o spinal tumors, h/o compression fx, abdominal pain, fever, night sweats, nausea, vomiting;  PRECAUTIONS: None  WEIGHT BEARING RESTRICTIONS: No  FALLS: Has patient fallen in last 6 months? Yes. Number of falls 1  (a couple falls before that)  Living Environment Lives with: lives with their family, with daughter Lives in: House/apartment, one level Stairs: Yes: External: 3 steps; on right going up Has  following equipment at home: Environmental consultant - 2 wheeled, Shower bench, Grab bars, and handicap height commode  Prior level of function: Independent with basic ADLs, dtr provides some assist IADLs  Occupational demands: Retired, worked in [employer]  Hobbies: [place], watching television, crafts;  Patient Goals: Pt would like to remain independent as long as possible  OBJECTIVE:  Patient Surveys  FOTO: 48, predicted improvement to 75;  ABC: To be completed;  Cognition Patient is oriented to person, place, and time.  Recent memory and remote memory appear slightly impaired; Attention span and concentration are intact.  Expressive speech is intact.  Patient's fund of knowledge is within normal limits for educational level.    Gross Musculoskeletal Assessment Tremor: None Bulk: Normal Tone: Normal  Posture: Forward head and rounded shoulders  LE MMT: MMT (out of 5) Right  Left   Hip flexion 4+ 4+  Hip extension    Hip abduction    Hip adduction    Hip internal rotation    Hip external rotation    Knee flexion 5 5  Knee extension 5 5  Ankle dorsiflexion 4 4  Ankle plantarflexion    Ankle inversion    Ankle eversion    (* = pain; Blank rows = not tested)  Transfers: Assistive device utilized: None  Sit to stand: Complete Independence Stand to sit: Complete Independence Chair to chair: Complete Independence Floor:  Not tested  Stairs: Level of Assistance: Modified independence Stair Negotiation Technique: Step to Pattern with Bilateral Rails Number of Stairs: 4  Height of Stairs: 6"  Comments: Decreased speed with step-to pattern;  Gait: Gait pattern: step through pattern, decreased step length- Right, and decreased step length- Left Distance walked: 100' Assistive device utilized: None Level of assistance: Complete Independence Comments: Decreased self-selected gait speed  Functional Outcome Measures  01/20/23 01/22/23 Comments  BERG  42/56 Increased risk for fall, in  need of intervention  DGI     FGA     TUG 17.5 seconds  Increased risk for fall, in need of intervention  5TSTS 17.3 seconds  Increased risk for fall, in need of intervention  6 Minute Walk Test     10 Meter Gait Speed Self-selected: 17.0s = 0.59 m/s; Fastest: 12.4s = 0.81 m/s  Below threshold for full community mobility  (Blank rows = not tested)  TODAY'S TREATMENT   SUBJECTIVE: Pt reports that she is doing well today. No updates since the last therapy session. No specific pain reported upon arrival. She is performing her HEP. No recent falls. No specific questions or concerns.   PAIN: Denies   Ther-ex  NuStep L1-4 x 10 minutes for LE strengthening with therapist adjusting resistance and monitoring fatigue; Forward 6" step ups alternating leading LE x 10 on each side; Sit to stand with 6# overhead med ball press 2 x 10;   Neuromuscular Re-education  Stair ascend/descend with reciprocal pattern up and step-to pattern down 4 steps x 4;  Tandem balance alternating forward LE x 30s each; Tandem gait in // bars without UE support x 2 lengths; Airex feet apart eyes open/closed x 30s each; Airex feet apart eyes open with horizontal and vertical head turns x 30s each;  Airex alternating 6" step taps x 10 BLE;   Not performed: Lateral 6" step ups x 10 toward each side; Seated LAQ with 5# ankle weights 2 x 10 BLE; Standing hip strengthening with 4# ankle weights in // bars: Hip flexion marches x 10 BLE; HS curls x 10 BLE; Hip abduction x 10 BLE; Hip extension x 10 BLE; Standing heel/toe raises with BUE support x 10 BLE; Side stepping with 5# AW x 6 lengths; Seated clams with manual resistance from therapist x 10 BLE; Seated adductor squeezes with manual resistance from therapist x 10 BLE;   PATIENT EDUCATION:  Education details: Pt educated throughout session about proper posture and technique with exercises. Improved exercise technique, movement at target joints, use of  target muscles after min to mod verbal, visual, tactile cues. Balance exercises Person educated: Patient Education method: Explanation, Demonstration, Verbal cues, and Handouts Education comprehension: verbalized understanding, returned demonstration, and verbal cues required   HOME EXERCISE PROGRAM:  Access Code: [email or web address] URL: [email or web address] Date: 01/22/2023 Prepared by: Roxana Hires  Exercises - Sit to Stand Without Arm Support  - 1 x daily - 7 x weekly - 2 sets - 10 reps - Standing Hip Abduction with Counter Support  - 1 x daily - 7 x weekly - 2 sets - 10 reps - 3s hold - Heel Raises with Counter Support  - 1 x daily - 7 x weekly - 2 sets - 10 reps - 3s hold - Standing March with Counter Support  - 1 x daily - 7 x weekly - 2 sets - 10 reps - 3s hold - Corner Balance Feet Together: Eyes Open With Head Turns  - 1 x daily - 7 x weekly - 3 reps - 30s hold   ASSESSMENT:  CLINICAL IMPRESSION: Patient demonstrates excellent motivation during session today. Progressed strengthening but more time spent today working on balance exercises. She continues to require intermittent rest breaks d/t fatigue. No LOB noted during therapeutic interventions this session. Pt will benefit from PT services to address deficits in strength, balance, and mobility in order to return to full function at home and decrease her risk for falls.   OBJECTIVE IMPAIRMENTS: decreased balance, difficulty walking, and decreased strength.   ACTIVITY LIMITATIONS: squatting, stairs, and transfers  PARTICIPATION LIMITATIONS: cleaning, laundry, shopping, and community activity  PERSONAL FACTORS: Age, Past/current experiences, Time since onset of injury/illness/exacerbation, and 3+ comorbidities: DM, HTN, and colon CA  are also affecting patient's functional outcome.   REHAB POTENTIAL: Good  CLINICAL DECISION MAKING: Unstable/unpredictable  EVALUATION COMPLEXITY: High   GOALS: Goals reviewed with  patient? Yes  SHORT TERM GOALS: Target  date: 02/17/2023  Pt will be independent with HEP in order to improve strength and balance in order to decrease fall risk and improve function at home. Baseline:  Goal status: INITIAL   LONG TERM GOALS: Target date: 03/17/2023  Pt will increase FOTO to at least 54 to demonstrate significant improvement in function at home related to balance  Baseline: 01/20/23: 48 Goal status: INITIAL  2.  Pt will improve BERG by at least 3 points in order to demonstrate clinically significant improvement in balance.   Baseline: 01/20/23: To be completed; 01/22/23: 42/56; Goal status: INITIAL  3.  Pt will improve ABC by at least 13% in order to demonstrate clinically significant improvement in balance confidence.      Baseline: 01/20/23: To be completed; 01/22/23: 34.4%; Goal status: INITIAL  4. Pt will decrease 5TSTS by at least 3 seconds in order to demonstrate clinically significant improvement in LE strength      Baseline: 01/20/23: 17.3s Goal status: INITIAL  5. Pt will decrease TUG to below 14 seconds/decrease in order to demonstrate decreased fall risk.  Baseline: 01/20/23: 17.5s Goal status: INITIAL  PLAN: PT FREQUENCY: 2x/week  PT DURATION: 8 weeks  PLANNED INTERVENTIONS: Therapeutic exercises, Therapeutic activity, Neuromuscular re-education, Balance training, Gait training, Patient/Family education, Self Care, Joint mobilization, Joint manipulation, Vestibular training, Canalith repositioning, Orthotic/Fit training, DME instructions, Dry Needling, Electrical stimulation, Spinal manipulation, Spinal mobilization, Cryotherapy, Moist heat, Taping, Traction, Ultrasound, Ionotophoresis 4mg /ml Dexamethasone, Manual therapy, and Re-evaluation.  PLAN FOR NEXT SESSION: progress balance/strength exercises, review/modify HEP as needed;   Lyndel Safe Zoei Amison PT, DPT, GCS  Lando Alcalde 02/03/2023, 12:48 PM

## 2023-02-03 ENCOUNTER — Ambulatory Visit: Payer: Medicare Other

## 2023-02-03 DIAGNOSIS — R269 Unspecified abnormalities of gait and mobility: Secondary | ICD-10-CM | POA: Diagnosis not present

## 2023-02-03 DIAGNOSIS — R2681 Unsteadiness on feet: Secondary | ICD-10-CM

## 2023-02-05 ENCOUNTER — Ambulatory Visit: Payer: Medicare Other

## 2023-02-05 DIAGNOSIS — R2681 Unsteadiness on feet: Secondary | ICD-10-CM | POA: Diagnosis not present

## 2023-02-05 DIAGNOSIS — R269 Unspecified abnormalities of gait and mobility: Secondary | ICD-10-CM | POA: Diagnosis not present

## 2023-02-05 NOTE — Therapy (Signed)
OUTPATIENT PHYSICAL THERAPY BALANCE TREATMENT  Patient Name: [name] MRN: [id number] DOB:May 03, 1946, 77 y.o., female Today's Date: 02/05/2023  END OF SESSION:  PT End of Session - 02/05/23 1101     Visit Number 6    Number of Visits 17    Date for PT Re-Evaluation 03/17/23    Authorization Type eval: 01/20/23    PT Start Time 1101    PT Stop Time 1145    PT Time Calculation (min) 44 min    Equipment Utilized During Treatment Gait belt    Activity Tolerance Patient tolerated treatment well    Behavior During Therapy WFL for tasks assessed/performed            Past Medical History:  Diagnosis Date   Allergy    Arthritis of knee, degenerative 05/08/2015   Basal cell carcinoma 03/2019   nose   Colon cancer ([place]) 2014   Partial colon resection, chemo + rad tx's.    Complication of anesthesia    Diabetes mellitus without complication ([place])    Pt takes Metformin.   Hyperlipidemia    Hypertension    PONV (postoperative nausea and vomiting)    SBO (small bowel obstruction) (HCC) 04/20/2018   Seizures ([place])    Umbilical hernia with obstruction but no gangrene    Vitamin D deficiency    Past Surgical History:  Procedure Laterality Date   CATARACT EXTRACTION, BILATERAL  01/2020   Dr. Lucianne Lei [facility]   COLOSTOMY REVERSAL  01/2014   ILEOSTOMY  [id number]   UMBILICAL HERNIA REPAIR  05/2018   Patient Active Problem List   Diagnosis Date Noted   Stage 3a chronic kidney disease (CKD) ([place]) 12/30/2022   Trigger finger, left ring finger 12/27/2021   Primary osteoarthritis of both hips 06/10/2019   Bilateral carotid artery stenosis 08/24/2018   Atherosclerosis of aorta ([place]) 08/24/2018   Type II diabetes mellitus with complication ([place]) [id number]   Bradycardia 07/24/2018   Neoplasm of uncertain behavior of skin 01/16/2017   Senile ecchymosis 08/20/2015   Essential (primary) hypertension 05/08/2015   History of rectal cancer 05/08/2015   Hyperlipidemia associated with type 2  diabetes mellitus ([place]) 05/08/2015   Seizure disorder ([place]) 05/08/2015   Shoulder strain 05/08/2015   Avitaminosis D 05/08/2015   PCP: Glean Hess, MD  REFERRING PROVIDER: Glean Hess, MD   REFERRING DIAG: R26.9 (ICD-10-CM) - Gait disturbance   RATIONALE FOR EVALUATION AND TREATMENT: Rehabilitation  THERAPY DIAG: Unsteadiness on feet  ONSET DATE: 01/17/22 (approximate)  FOLLOW-UP APPT SCHEDULED WITH REFERRING PROVIDER: Yes (in 4 months per patient)  FROM INITIAL EVALUATION (01/20/23) SUBJECTIVE:  SUBJECTIVE STATEMENT:  Unsteadiness  PERTINENT HISTORY:  Pt complains of intermittent difficulty with her balance over the period of multiple months. She fell 6 months ago and fractured multiple ribs. She has noticed when walking across the grass she needs additional support. She uses cart when at the grocery store. She reports some urinary and bowel urge incontinence.    Pain: Yes, knee, back, and shoulder pain; Numbness/Tingling: Yes, occasional numbness in L foot and R hand. 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  Focal Weakness: No, generalized LE weakness Recent changes in overall health/medication: No Prior history of physical therapy for balance:  No Dominant hand: left Red flags: Pt reports some nighttime urinary incontinence and some daily bowel issues, history of colon cancer, occasional chills; Denies h/o spinal tumors, h/o compression fx, abdominal pain, fever, night sweats, nausea, vomiting;  PRECAUTIONS: None  WEIGHT BEARING RESTRICTIONS: No  FALLS: Has patient fallen in last 6 months? Yes. Number of falls 1  (a couple falls before that)  Living Environment Lives with: lives with their family, with daughter Lives in: House/apartment, one level Stairs: Yes: External: 3 steps; on right going up Has  following equipment at home: Environmental consultant - 2 wheeled, Shower bench, Grab bars, and handicap height commode  Prior level of function: Independent with basic ADLs, dtr provides some assist IADLs  Occupational demands: Retired, worked in [employer]  Hobbies: [place], watching television, crafts;  Patient Goals: Pt would like to remain independent as long as possible  OBJECTIVE:  Patient Surveys  FOTO: 48, predicted improvement to 12;  ABC: To be completed;  Cognition Patient is oriented to person, place, and time.  Recent memory and remote memory appear slightly impaired; Attention span and concentration are intact.  Expressive speech is intact.  Patient's fund of knowledge is within normal limits for educational level.    Gross Musculoskeletal Assessment Tremor: None Bulk: Normal Tone: Normal  Posture: Forward head and rounded shoulders  LE MMT: MMT (out of 5) Right  Left   Hip flexion 4+ 4+  Hip extension    Hip abduction    Hip adduction    Hip internal rotation    Hip external rotation    Knee flexion 5 5  Knee extension 5 5  Ankle dorsiflexion 4 4  Ankle plantarflexion    Ankle inversion    Ankle eversion    (* = pain; Blank rows = not tested)  Transfers: Assistive device utilized: None  Sit to stand: Complete Independence Stand to sit: Complete Independence Chair to chair: Complete Independence Floor:  Not tested  Stairs: Level of Assistance: Modified independence Stair Negotiation Technique: Step to Pattern with Bilateral Rails Number of Stairs: 4  Height of Stairs: 6"  Comments: Decreased speed with step-to pattern;  Gait: Gait pattern: step through pattern, decreased step length- Right, and decreased step length- Left Distance walked: 100' Assistive device utilized: None Level of assistance: Complete Independence Comments: Decreased self-selected gait speed  Functional Outcome Measures  01/20/23 01/22/23 Comments  BERG  42/56 Increased risk for fall, in  need of intervention  DGI     FGA     TUG 17.5 seconds  Increased risk for fall, in need of intervention  5TSTS 17.3 seconds  Increased risk for fall, in need of intervention  6 Minute Walk Test     10 Meter Gait Speed Self-selected: 17.0s = 0.59 m/s; Fastest: 12.4s = 0.81 m/s  Below threshold for full community mobility  (Blank rows = not tested)  TODAY'S TREATMENT   SUBJECTIVE: Pt reports that she is doing well today. No updates since the last therapy session. No specific pain reported upon arrival. She is performing her HEP. No recent falls. No specific questions or concerns.   PAIN: Denies   Ther-ex  NuStep L1-4 x 10 minutes for LE strengthening with therapist adjusting resistance and monitoring fatigue, interval history obtained; Nautilus resisted gait 50# forward, backward, R lateral, and L lateral x 3 each; Sit to stand with 6# overhead med ball press 2 x 10;   Neuromuscular Re-education  Clock stepping x multiple bouts with each leg; 6" hurdle stepping obstacle course x multiple lengths; Ladder side stepping 12' x multiple lengths;   Not performed: Lateral 6" step ups x 10 toward each side; Seated LAQ with 5# ankle weights 2 x 10 BLE; Standing hip strengthening with 4# ankle weights in // bars: Hip flexion marches x 10 BLE; HS curls x 10 BLE; Hip abduction x 10 BLE; Hip extension x 10 BLE; Standing heel/toe raises with BUE support x 10 BLE; Side stepping with 5# AW x 6 lengths; Seated clams with manual resistance from therapist x 10 BLE; Seated adductor squeezes with manual resistance from therapist x 10 BLE; Stair ascend/descend with reciprocal pattern up and step-to pattern down 4 steps x 4;  Tandem balance alternating forward LE x 30s each; Tandem gait in // bars without UE support x 2 lengths; Airex feet apart eyes open/closed x 30s each; Airex feet apart eyes open with horizontal and vertical head turns x 30s each;  Airex alternating 6" step taps x 10  BLE;   PATIENT EDUCATION:  Education details: Pt educated throughout session about proper posture and technique with exercises. Improved exercise technique, movement at target joints, use of target muscles after min to mod verbal, visual, tactile cues. Balance exercises Person educated: Patient Education method: Explanation, Demonstration, Verbal cues, and Handouts Education comprehension: verbalized understanding, returned demonstration, and verbal cues required   HOME EXERCISE PROGRAM:  Access Code: [email or web address] URL: [email or web address] Date: 01/22/2023 Prepared by: Roxana Hires  Exercises - Sit to Stand Without Arm Support  - 1 x daily - 7 x weekly - 2 sets - 10 reps - Standing Hip Abduction with Counter Support  - 1 x daily - 7 x weekly - 2 sets - 10 reps - 3s hold - Heel Raises with Counter Support  - 1 x daily - 7 x weekly - 2 sets - 10 reps - 3s hold - Standing March with Counter Support  - 1 x daily - 7 x weekly - 2 sets - 10 reps - 3s hold - Corner Balance Feet Together: Eyes Open With Head Turns  - 1 x daily - 7 x weekly - 3 reps - 30s hold   ASSESSMENT:  CLINICAL IMPRESSION: Patient demonstrates excellent motivation during session today. Progressed strengthening and balance exercises during session today. She continues to require intermittent rest breaks d/t fatigue. No LOB noted during therapeutic interventions this session. Pt will benefit from PT services to address deficits in strength, balance, and mobility in order to return to full function at home and decrease her risk for falls.   OBJECTIVE IMPAIRMENTS: decreased balance, difficulty walking, and decreased strength.   ACTIVITY LIMITATIONS: squatting, stairs, and transfers  PARTICIPATION LIMITATIONS: cleaning, laundry, shopping, and community activity  PERSONAL FACTORS: Age, Past/current experiences, Time since onset of injury/illness/exacerbation, and 3+ comorbidities: DM, HTN, and colon CA  are also  affecting patient's functional outcome.  REHAB POTENTIAL: Good  CLINICAL DECISION MAKING: Unstable/unpredictable  EVALUATION COMPLEXITY: High   GOALS: Goals reviewed with patient? Yes  SHORT TERM GOALS: Target date: 02/17/2023  Pt will be independent with HEP in order to improve strength and balance in order to decrease fall risk and improve function at home. Baseline:  Goal status: INITIAL   LONG TERM GOALS: Target date: 03/17/2023  Pt will increase FOTO to at least 54 to demonstrate significant improvement in function at home related to balance  Baseline: 01/20/23: 48 Goal status: INITIAL  2.  Pt will improve BERG by at least 3 points in order to demonstrate clinically significant improvement in balance.   Baseline: 01/20/23: To be completed; 01/22/23: 42/56; Goal status: INITIAL  3.  Pt will improve ABC by at least 13% in order to demonstrate clinically significant improvement in balance confidence.      Baseline: 01/20/23: To be completed; 01/22/23: 34.4%; Goal status: INITIAL  4. Pt will decrease 5TSTS by at least 3 seconds in order to demonstrate clinically significant improvement in LE strength      Baseline: 01/20/23: 17.3s Goal status: INITIAL  5. Pt will decrease TUG to below 14 seconds/decrease in order to demonstrate decreased fall risk.  Baseline: 01/20/23: 17.5s Goal status: INITIAL  PLAN: PT FREQUENCY: 2x/week  PT DURATION: 8 weeks  PLANNED INTERVENTIONS: Therapeutic exercises, Therapeutic activity, Neuromuscular re-education, Balance training, Gait training, Patient/Family education, Self Care, Joint mobilization, Joint manipulation, Vestibular training, Canalith repositioning, Orthotic/Fit training, DME instructions, Dry Needling, Electrical stimulation, Spinal manipulation, Spinal mobilization, Cryotherapy, Moist heat, Taping, Traction, Ultrasound, Ionotophoresis 4mg /ml Dexamethasone, Manual therapy, and Re-evaluation.  PLAN FOR NEXT SESSION: progress  balance/strength exercises, review/modify HEP as needed;   Lyndel Safe Meloney Feld PT, DPT, GCS  Aslan Himes 02/05/2023, 10:05 PM

## 2023-02-09 NOTE — Therapy (Signed)
OUTPATIENT PHYSICAL THERAPY BALANCE TREATMENT  Patient Name: [name] MRN: [id number] DOB:1946-07-05, 77 y.o., female Today's Date: 02/10/2023  END OF SESSION:  PT End of Session - 02/10/23 1025     Visit Number 7    Number of Visits 17    Date for PT Re-Evaluation 03/17/23    Authorization Type eval: 01/20/23    PT Start Time 1015    PT Stop Time 1100    PT Time Calculation (min) 45 min    Equipment Utilized During Treatment Gait belt    Activity Tolerance Patient tolerated treatment well    Behavior During Therapy WFL for tasks assessed/performed            Past Medical History:  Diagnosis Date   Allergy    Arthritis of knee, degenerative 05/08/2015   Basal cell carcinoma 03/2019   nose   Colon cancer ([place]) 2014   Partial colon resection, chemo + rad tx's.    Complication of anesthesia    Diabetes mellitus without complication ([place])    Pt takes Metformin.   Hyperlipidemia    Hypertension    PONV (postoperative nausea and vomiting)    SBO (small bowel obstruction) ([facility]) 04/20/2018   Seizures ([place])    Umbilical hernia with obstruction but no gangrene    Vitamin D deficiency    Past Surgical History:  Procedure Laterality Date   CATARACT EXTRACTION, BILATERAL  01/2020   Dr. Lucianne Lei [facility]   COLOSTOMY REVERSAL  01/2014   ILEOSTOMY  [id number]   UMBILICAL HERNIA REPAIR  05/2018   Patient Active Problem List   Diagnosis Date Noted   Stage 3a chronic kidney disease (CKD) ([place]) 12/30/2022   Trigger finger, left ring finger 12/27/2021   Primary osteoarthritis of both hips 06/10/2019   Bilateral carotid artery stenosis 08/24/2018   Atherosclerosis of aorta ([place]) 08/24/2018   Type II diabetes mellitus with complication ([place]) [id number]   Bradycardia 07/24/2018   Neoplasm of uncertain behavior of skin 01/16/2017   Senile ecchymosis 08/20/2015   Essential (primary) hypertension 05/08/2015   History of rectal cancer 05/08/2015   Hyperlipidemia associated with type 2  diabetes mellitus ([place]) 05/08/2015   Seizure disorder ([place]) 05/08/2015   Shoulder strain 05/08/2015   Avitaminosis D 05/08/2015   PCP: Glean Hess, MD  REFERRING PROVIDER: Glean Hess, MD   REFERRING DIAG: R26.9 (ICD-10-CM) - Gait disturbance   RATIONALE FOR EVALUATION AND TREATMENT: Rehabilitation  THERAPY DIAG: Unsteadiness on feet  ONSET DATE: 01/17/22 (approximate)  FOLLOW-UP APPT SCHEDULED WITH REFERRING PROVIDER: Yes (in 4 months per patient)  FROM INITIAL EVALUATION (01/20/23) SUBJECTIVE:  SUBJECTIVE STATEMENT:  Unsteadiness  PERTINENT HISTORY:  Pt complains of intermittent difficulty with her balance over the period of multiple months. She fell 6 months ago and fractured multiple ribs. She has noticed when walking across the grass she needs additional support. She uses cart when at the grocery store. She reports some urinary and bowel urge incontinence.    Pain: Yes, knee, back, and shoulder pain; Numbness/Tingling: Yes, occasional numbness in L foot and R hand. 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  Focal Weakness: No, generalized LE weakness Recent changes in overall health/medication: No Prior history of physical therapy for balance:  No Dominant hand: left Red flags: Pt reports some nighttime urinary incontinence and some daily bowel issues, history of colon cancer, occasional chills; Denies h/o spinal tumors, h/o compression fx, abdominal pain, fever, night sweats, nausea, vomiting;  PRECAUTIONS: None  WEIGHT BEARING RESTRICTIONS: No  FALLS: Has patient fallen in last 6 months? Yes. Number of falls 1  (a couple falls before that)  Living Environment Lives with: lives with their family, with daughter Lives in: House/apartment, one level Stairs: Yes: External: 3 steps; on right going up Has  following equipment at home: Environmental consultant - 2 wheeled, Shower bench, Grab bars, and handicap height commode  Prior level of function: Independent with basic ADLs, dtr provides some assist IADLs  Occupational demands: Retired, worked in Her[employer]  Hobbies: [place], watching television, crafts;  Patient Goals: Pt would like to remain independent as long as possible  OBJECTIVE:  Patient Surveys  FOTO: 48, predicted improvement to 110;  ABC: To be completed;  Cognition Patient is oriented to person, place, and time.  Recent memory and remote memory appear slightly impaired; Attention span and concentration are intact.  Expressive speech is intact.  Patient's fund of knowledge is within normal limits for educational level.    Gross Musculoskeletal Assessment Tremor: None Bulk: Normal Tone: Normal  Posture: Forward head and rounded shoulders  LE MMT: MMT (out of 5) Right  Left   Hip flexion 4+ 4+  Hip extension    Hip abduction    Hip adduction    Hip internal rotation    Hip external rotation    Knee flexion 5 5  Knee extension 5 5  Ankle dorsiflexion 4 4  Ankle plantarflexion    Ankle inversion    Ankle eversion    (* = pain; Blank rows = not tested)  Transfers: Assistive device utilized: None  Sit to stand: Complete Independence Stand to sit: Complete Independence Chair to chair: Complete Independence Floor:  Not tested  Stairs: Level of Assistance: Modified independence Stair Negotiation Technique: Step to Pattern with Bilateral Rails Number of Stairs: 4  Height of Stairs: 6"  Comments: Decreased speed with step-to pattern;  Gait: Gait pattern: step through pattern, decreased step length- Right, and decreased step length- Left Distance walked: 100' Assistive device utilized: None Level of assistance: Complete Independence Comments: Decreased self-selected gait speed  Functional Outcome Measures  01/20/23 01/22/23 Comments  BERG  42/56 Increased risk for fall, in  need of intervention  DGI     FGA     TUG 17.5 seconds  Increased risk for fall, in need of intervention  5TSTS 17.3 seconds  Increased risk for fall, in need of intervention  6 Minute Walk Test     10 Meter Gait Speed Self-selected: 17.0s = 0.59 m/s; Fastest: 12.4s = 0.81 m/s  Below threshold for full community mobility  (Blank rows = not tested)  TODAY'S TREATMENT   SUBJECTIVE: Pt reports that she is doing well today. No updates since the last therapy session. No specific pain reported upon arrival. She is performing her HEP. No recent falls. No specific questions or concerns.   PAIN: Denies   Ther-ex  NuStep L1-3 x 10 minutes for LE strengthening with therapist adjusting resistance and monitoring fatigue, interval history obtained;  Standing hip strengthening with 5# ankle weights (AW) in // bars: Hip flexion marches x 10 BLE; HS curls x 10 BLE; Hip abduction x 10 BLE; Hip extension x 10 BLE;  Seated LAQ with 5# AW 2 x 10 BLE; Sit to stand with 6# overhead med ball press 2 x 10; Seated clams with manual resistance from therapist x 10 BLE; Seated adductor squeezes with manual resistance from therapist x 10 BLE;   Neuromuscular Re-education  Standing heel/toe raises with BUE support x 10 BLE; Tandem balance alternating forward LE x 30s each; Tandem gait in // bars without UE support x 2 lengths; Airex feet apart eyes open/closed x 30s each; Airex feet apart eyes open with horizontal and vertical head turns x 30s each;    Not performed: Lateral 6" step ups x 10 toward each side; Side stepping with 5# AW x 6 lengths; Stair ascend/descend with reciprocal pattern up and step-to pattern down 4 steps x 4;  Nautilus resisted gait 50# forward, backward, R lateral, and L lateral x 3 each; Airex alternating 6" step taps x 10 BLE; Clock stepping x multiple bouts with each leg; 6" hurdle stepping obstacle course x multiple lengths; Ladder side stepping 12' x multiple  lengths;   PATIENT EDUCATION:  Education details: Pt educated throughout session about proper posture and technique with exercises. Improved exercise technique, movement at target joints, use of target muscles after min to mod verbal, visual, tactile cues. Balance exercises Person educated: Patient Education method: Explanation, Demonstration, Verbal cues, and Handouts Education comprehension: verbalized understanding, returned demonstration, and verbal cues required   HOME EXERCISE PROGRAM:  Access Code: [email or web address] URL: [email or web address] Date: 01/22/2023 Prepared by: Roxana Hires  Exercises - Sit to Stand Without Arm Support  - 1 x daily - 7 x weekly - 2 sets - 10 reps - Standing Hip Abduction with Counter Support  - 1 x daily - 7 x weekly - 2 sets - 10 reps - 3s hold - Heel Raises with Counter Support  - 1 x daily - 7 x weekly - 2 sets - 10 reps - 3s hold - Standing March with Counter Support  - 1 x daily - 7 x weekly - 2 sets - 10 reps - 3s hold - Corner Balance Feet Together: Eyes Open With Head Turns  - 1 x daily - 7 x weekly - 3 reps - 30s hold   ASSESSMENT:  CLINICAL IMPRESSION: Patient demonstrates excellent motivation during session today. Progressed strengthening and balance exercises during session today. Increased ankle weights to 5# today. She continues to require intermittent rest breaks d/t fatigue. No LOB noted during therapeutic interventions this session however she is more unstable on the Airex pad. Pt will benefit from PT services to address deficits in strength, balance, and mobility in order to return to full function at home and decrease her risk for falls.   OBJECTIVE IMPAIRMENTS: decreased balance, difficulty walking, and decreased strength.   ACTIVITY LIMITATIONS: squatting, stairs, and transfers  PARTICIPATION LIMITATIONS: cleaning, laundry, shopping, and community activity  PERSONAL FACTORS: Age, Past/current experiences, Time since  onset of injury/illness/exacerbation,  and 3+ comorbidities: DM, HTN, and colon CA  are also affecting patient's functional outcome.   REHAB POTENTIAL: Good  CLINICAL DECISION MAKING: Unstable/unpredictable  EVALUATION COMPLEXITY: High   GOALS: Goals reviewed with patient? Yes  SHORT TERM GOALS: Target date: 02/17/2023  Pt will be independent with HEP in order to improve strength and balance in order to decrease fall risk and improve function at home. Baseline:  Goal status: INITIAL   LONG TERM GOALS: Target date: 03/17/2023  Pt will increase FOTO to at least 54 to demonstrate significant improvement in function at home related to balance  Baseline: 01/20/23: 48 Goal status: INITIAL  2.  Pt will improve BERG by at least 3 points in order to demonstrate clinically significant improvement in balance.   Baseline: 01/20/23: To be completed; 01/22/23: 42/56; Goal status: INITIAL  3.  Pt will improve ABC by at least 13% in order to demonstrate clinically significant improvement in balance confidence.      Baseline: 01/20/23: To be completed; 01/22/23: 34.4%; Goal status: INITIAL  4. Pt will decrease 5TSTS by at least 3 seconds in order to demonstrate clinically significant improvement in LE strength      Baseline: 01/20/23: 17.3s Goal status: INITIAL  5. Pt will decrease TUG to below 14 seconds/decrease in order to demonstrate decreased fall risk.  Baseline: 01/20/23: 17.5s Goal status: INITIAL  PLAN: PT FREQUENCY: 2x/week  PT DURATION: 8 weeks  PLANNED INTERVENTIONS: Therapeutic exercises, Therapeutic activity, Neuromuscular re-education, Balance training, Gait training, Patient/Family education, Self Care, Joint mobilization, Joint manipulation, Vestibular training, Canalith repositioning, Orthotic/Fit training, DME instructions, Dry Needling, Electrical stimulation, Spinal manipulation, Spinal mobilization, Cryotherapy, Moist heat, Taping, Traction, Ultrasound, Ionotophoresis 4mg /ml  Dexamethasone, Manual therapy, and Re-evaluation.  PLAN FOR NEXT SESSION: progress balance/strength exercises, review/modify HEP as needed;   Lyndel Safe Hether Anselmo PT, DPT, GCS  Chaka Boyson 02/10/2023, 1:46 PM

## 2023-02-10 ENCOUNTER — Ambulatory Visit: Payer: Medicare Other

## 2023-02-10 ENCOUNTER — Other Ambulatory Visit: Payer: Self-pay | Admitting: Internal Medicine

## 2023-02-10 DIAGNOSIS — E113392 Type 2 diabetes mellitus with moderate nonproliferative diabetic retinopathy without macular edema, left eye: Secondary | ICD-10-CM | POA: Diagnosis not present

## 2023-02-10 DIAGNOSIS — G40909 Epilepsy, unspecified, not intractable, without status epilepticus: Secondary | ICD-10-CM

## 2023-02-10 DIAGNOSIS — Z7984 Long term (current) use of oral hypoglycemic drugs: Secondary | ICD-10-CM | POA: Diagnosis not present

## 2023-02-10 DIAGNOSIS — R2681 Unsteadiness on feet: Secondary | ICD-10-CM | POA: Diagnosis not present

## 2023-02-10 DIAGNOSIS — H16223 Keratoconjunctivitis sicca, not specified as Sjogren's, bilateral: Secondary | ICD-10-CM | POA: Diagnosis not present

## 2023-02-10 DIAGNOSIS — R269 Unspecified abnormalities of gait and mobility: Secondary | ICD-10-CM | POA: Diagnosis not present

## 2023-02-10 DIAGNOSIS — H35033 Hypertensive retinopathy, bilateral: Secondary | ICD-10-CM | POA: Diagnosis not present

## 2023-02-10 DIAGNOSIS — E113311 Type 2 diabetes mellitus with moderate nonproliferative diabetic retinopathy with macular edema, right eye: Secondary | ICD-10-CM | POA: Diagnosis not present

## 2023-02-10 LAB — HM DIABETES EYE EXAM

## 2023-02-10 NOTE — Telephone Encounter (Signed)
Medication Refill - Medication: carbamazepine (TEGRETOL) 200 MG tablet   Has the patient contacted their pharmacy? Yes.     Preferred Pharmacy (with phone number or street name):  [facility] [place], [place] Phone: [phone]  Fax: [phone]     Has the patient been seen for an appointment in the last year OR does the patient have an upcoming appointment? Yes.    Please assist patient further

## 2023-02-11 MED ORDER — CARBAMAZEPINE 200 MG PO TABS: ORAL_TABLET | ORAL | 2 refills | Status: DC

## 2023-02-11 NOTE — Telephone Encounter (Signed)
Requested Prescriptions  Pending Prescriptions Disp Refills   carbamazepine (TEGRETOL) 200 MG tablet 90 tablet 2    Sig: TAKE 1 TABLET(200 MG) BY MOUTH DAILY     Neurology:  Anticonvulsants - carbamazepine Failed - 02/10/2023 10:25 AM      Failed - Cr in normal range and within 360 days    Creatinine  Date Value Ref Range Status  05/04/2013 1.01 0.60 - 1.30 mg/dL Final   Creatinine, Ser  Date Value Ref Range Status  12/30/2022 1.12 (H) 0.57 - 1.00 mg/dL Final         Passed - AST in normal range and within 360 days    AST  Date Value Ref Range Status  12/30/2022 14 0 - 40 IU/L Final   SGOT(AST)  Date Value Ref Range Status  05/04/2013 11 (L) 15 - 37 Unit/L Final         Passed - ALT in normal range and within 360 days    ALT  Date Value Ref Range Status  12/30/2022 11 0 - 32 IU/L Final   SGPT (ALT)  Date Value Ref Range Status  05/04/2013 19 12 - 78 U/L Final         Passed - Carbamazepine (serum) in normal range and within 360 days    Carbamazepine (Tegretol), S  Date Value Ref Range Status  12/30/2022 6.3 4.0 - 12.0 ug/mL Final    Comment:             In conjunction with other antiepileptic drugs                                Therapeutic  4.0 -  8.0                                Toxicity     9.0 - 12.0                                    Carbamazepine alone                                Therapeutic  8.0 - 12.0                                 Detection Limit =  2.0                           <2.0 indicates None Detected          Passed - WBC in normal range and within 360 days    WBC  Date Value Ref Range Status  12/30/2022 7.8 3.4 - 10.8 x10E3/uL Final  04/23/2021 8.0 4.0 - 10.5 K/uL Final         Passed - PLT in normal range and within 360 days    Platelets  Date Value Ref Range Status  12/30/2022 235 150 - 450 x10E3/uL Final         Passed - HGB in normal range and within 360 days    Hemoglobin  Date Value Ref Range Status  12/30/2022 13.5 11.1  - 15.9 g/dL Final  Passed - Na in normal range and within 360 days    Sodium  Date Value Ref Range Status  12/30/2022 142 134 - 144 mmol/L Final  04/13/2013 136 136 - 145 mmol/L Final         Passed - HCT in normal range and within 360 days    Hematocrit  Date Value Ref Range Status  12/30/2022 41.0 34.0 - 46.6 % Final         Passed - Completed PHQ-2 or PHQ-9 in the last 360 days      Passed - Valid encounter within last 12 months    Recent Outpatient Visits           1 month ago Annual physical exam   [place] at [facility], Jesse Sans, MD   4 months ago Type II diabetes mellitus with complication [facility])   [place] at [facility], Jesse Sans, MD   5 months ago Acute cough   [place] at [facility], Jesse Sans, MD   7 months ago Multiple rib fractures involving four or more ribs   [facility] at [facility], Jesse Sans, MD   1 year ago Annual physical exam   [place] at [facility], Jesse Sans, MD       Future Appointments             In 2 months [place], Jesse Sans, MD [place] at [facility], [facility]

## 2023-02-12 ENCOUNTER — Ambulatory Visit: Payer: Medicare Other

## 2023-02-12 DIAGNOSIS — R2681 Unsteadiness on feet: Secondary | ICD-10-CM | POA: Diagnosis not present

## 2023-02-12 DIAGNOSIS — R269 Unspecified abnormalities of gait and mobility: Secondary | ICD-10-CM | POA: Diagnosis not present

## 2023-02-12 NOTE — Therapy (Signed)
OUTPATIENT PHYSICAL THERAPY BALANCE TREATMENT  Patient Name: [name] MRN: [id number] DOB:01/10/1946, 77 y.o., female Today's Date: 02/13/2023  END OF SESSION:  PT End of Session - 02/13/23 2141     Visit Number 8    Number of Visits 17    Date for PT Re-Evaluation 03/17/23    Authorization Type eval: 01/20/23    PT Start Time 1015    PT Stop Time 1100    PT Time Calculation (min) 45 min    Equipment Utilized During Treatment Gait belt    Activity Tolerance Patient tolerated treatment well    Behavior During Therapy WFL for tasks assessed/performed            Past Medical History:  Diagnosis Date   Allergy    Arthritis of knee, degenerative 05/08/2015   Basal cell carcinoma 03/2019   nose   Colon cancer ([place]) 2014   Partial colon resection, chemo + rad tx's.    Complication of anesthesia    Diabetes mellitus without complication ([place])    Pt takes Metformin.   Hyperlipidemia    Hypertension    PONV (postoperative nausea and vomiting)    SBO (small bowel obstruction) ([facility]) 04/20/2018   Seizures ([place])    Umbilical hernia with obstruction but no gangrene    Vitamin D deficiency    Past Surgical History:  Procedure Laterality Date   CATARACT EXTRACTION, BILATERAL  01/2020   Dr. Lucianne Lei [facility]   COLOSTOMY REVERSAL  01/2014   ILEOSTOMY  [id number]   UMBILICAL HERNIA REPAIR  05/2018   Patient Active Problem List   Diagnosis Date Noted   Stage 3a chronic kidney disease (CKD) ([place]) 12/30/2022   Trigger finger, left ring finger 12/27/2021   Primary osteoarthritis of both hips 06/10/2019   Bilateral carotid artery stenosis 08/24/2018   Atherosclerosis of aorta ([place]) 08/24/2018   Type II diabetes mellitus with complication ([place]) [id number]   Bradycardia 07/24/2018   Neoplasm of uncertain behavior of skin 01/16/2017   Senile ecchymosis 08/20/2015   Essential (primary) hypertension 05/08/2015   History of rectal cancer 05/08/2015   Hyperlipidemia associated with type 2  diabetes mellitus ([place]) 05/08/2015   Seizure disorder ([place]) 05/08/2015   Shoulder strain 05/08/2015   Avitaminosis D 05/08/2015   PCP: Glean Hess, MD  REFERRING PROVIDER: Glean Hess, MD   REFERRING DIAG: R26.9 (ICD-10-CM) - Gait disturbance   RATIONALE FOR EVALUATION AND TREATMENT: Rehabilitation  THERAPY DIAG: Unsteadiness on feet  ONSET DATE: 01/17/22 (approximate)  FOLLOW-UP APPT SCHEDULED WITH REFERRING PROVIDER: Yes (in 4 months per patient)  FROM INITIAL EVALUATION (01/20/23) SUBJECTIVE:  SUBJECTIVE STATEMENT:  Unsteadiness  PERTINENT HISTORY:  Pt complains of intermittent difficulty with her balance over the period of multiple months. She fell 6 months ago and fractured multiple ribs. She has noticed when walking across the grass she needs additional support. She uses cart when at the grocery store. She reports some urinary and bowel urge incontinence.    Pain: Yes, knee, back, and shoulder pain; Numbness/Tingling: Yes, occasional numbness in L foot and R hand. 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  Focal Weakness: No, generalized LE weakness Recent changes in overall health/medication: No Prior history of physical therapy for balance:  No Dominant hand: left Red flags: Pt reports some nighttime urinary incontinence and some daily bowel issues, history of colon cancer, occasional chills; Denies h/o spinal tumors, h/o compression fx, abdominal pain, fever, night sweats, nausea, vomiting;  PRECAUTIONS: None  WEIGHT BEARING RESTRICTIONS: No  FALLS: Has patient fallen in last 6 months? Yes. Number of falls 1  (a couple falls before that)  Living Environment Lives with: lives with their family, with daughter Lives in: House/apartment, one level Stairs: Yes: External: 3 steps; on right going up Has  following equipment at home: Environmental consultant - 2 wheeled, Shower bench, Grab bars, and handicap height commode  Prior level of function: Independent with basic ADLs, dtr provides some assist IADLs  Occupational demands: Retired, worked in [employer]  Hobbies: [place], watching television, crafts;  Patient Goals: Pt would like to remain independent as long as possible  OBJECTIVE:  Patient Surveys  FOTO: 48, predicted improvement to 73;  ABC: To be completed;  Cognition Patient is oriented to person, place, and time.  Recent memory and remote memory appear slightly impaired; Attention span and concentration are intact.  Expressive speech is intact.  Patient's fund of knowledge is within normal limits for educational level.    Gross Musculoskeletal Assessment Tremor: None Bulk: Normal Tone: Normal  Posture: Forward head and rounded shoulders  LE MMT: MMT (out of 5) Right  Left   Hip flexion 4+ 4+  Hip extension    Hip abduction    Hip adduction    Hip internal rotation    Hip external rotation    Knee flexion 5 5  Knee extension 5 5  Ankle dorsiflexion 4 4  Ankle plantarflexion    Ankle inversion    Ankle eversion    (* = pain; Blank rows = not tested)  Transfers: Assistive device utilized: None  Sit to stand: Complete Independence Stand to sit: Complete Independence Chair to chair: Complete Independence Floor:  Not tested  Stairs: Level of Assistance: Modified independence Stair Negotiation Technique: Step to Pattern with Bilateral Rails Number of Stairs: 4  Height of Stairs: 6"  Comments: Decreased speed with step-to pattern;  Gait: Gait pattern: step through pattern, decreased step length- Right, and decreased step length- Left Distance walked: 100' Assistive device utilized: None Level of assistance: Complete Independence Comments: Decreased self-selected gait speed  Functional Outcome Measures  01/20/23 01/22/23 Comments  BERG  42/56 Increased risk for fall, in  need of intervention  DGI     FGA     TUG 17.5 seconds  Increased risk for fall, in need of intervention  5TSTS 17.3 seconds  Increased risk for fall, in need of intervention  6 Minute Walk Test     10 Meter Gait Speed Self-selected: 17.0s = 0.59 m/s; Fastest: 12.4s = 0.81 m/s  Below threshold for full community mobility  (Blank rows = not tested)  TODAY'S TREATMENT   SUBJECTIVE: Pt reports that she is doing well today. No updates since the last therapy session. No specific pain reported upon arrival. She is performing her HEP. No recent falls. No specific questions or concerns.   PAIN: Denies   Ther-ex  NuStep L1-4 x 10 minutes for LE strengthening with therapist adjusting resistance and monitoring fatigue, interval history obtained; Forward 6" step ups alternting LE x 10 toward each side; Lateral 6" step ups x 10 toward each side; Sit to stand with 6# overhead med ball press 2 x 10; Seated clams with manual resistance from therapist x 10 BLE; Seated adductor squeezes with manual resistance from therapist x 10 BLE;   Neuromuscular Re-education  Clock stepping x multiple bouts with each leg; Tandem gait in // bars without UE support x 2 lengths; Forward 6" hurdle stepping obstacle course x multiple lengths; Lateral 6" hurdle stepping obstacle course x multiple lengths; Standing heel/toe raises with BUE support x 10 BLE;   Not performed: Side stepping with 5# AW x 6 lengths; Stair ascend/descend with reciprocal pattern up and step-to pattern down 4 steps x 4;  Nautilus resisted gait 50# forward, backward, R lateral, and L lateral x 3 each; Airex alternating 6" step taps x 10 BLE; Standing hip strengthening with 5# ankle weights (AW) in // bars: Hip flexion marches x 10 BLE; HS curls x 10 BLE; Hip abduction x 10 BLE; Hip extension x 10 BLE; Seated LAQ with 5# AW 2 x 10 BLE; Ladder side stepping 12' x multiple lengths; Tandem balance alternating forward LE x 30s  each; Airex feet apart eyes open/closed x 30s each; Airex feet apart eyes open with horizontal and vertical head turns x 30s each;   PATIENT EDUCATION:  Education details: Pt educated throughout session about proper posture and technique with exercises. Improved exercise technique, movement at target joints, use of target muscles after min to mod verbal, visual, tactile cues. Balance exercises Person educated: Patient Education method: Explanation, Demonstration, Verbal cues, and Handouts Education comprehension: verbalized understanding, returned demonstration, and verbal cues required   HOME EXERCISE PROGRAM:  Access Code: [email or web address] URL: [email or web address] Date: 01/22/2023 Prepared by: Roxana Hires  Exercises - Sit to Stand Without Arm Support  - 1 x daily - 7 x weekly - 2 sets - 10 reps - Standing Hip Abduction with Counter Support  - 1 x daily - 7 x weekly - 2 sets - 10 reps - 3s hold - Heel Raises with Counter Support  - 1 x daily - 7 x weekly - 2 sets - 10 reps - 3s hold - Standing March with Counter Support  - 1 x daily - 7 x weekly - 2 sets - 10 reps - 3s hold - Corner Balance Feet Together: Eyes Open With Head Turns  - 1 x daily - 7 x weekly - 3 reps - 30s hold   ASSESSMENT:  CLINICAL IMPRESSION: Patient demonstrates excellent motivation during session today. Progressed strengthening and balance exercises during session today. She continues to require intermittent rest breaks secondary to fatigue. No LOB noted during therapeutic interventions this session. Pt will benefit from PT services to address deficits in strength, balance, and mobility in order to return to full function at home and decrease her risk for falls.   OBJECTIVE IMPAIRMENTS: decreased balance, difficulty walking, and decreased strength.   ACTIVITY LIMITATIONS: squatting, stairs, and transfers  PARTICIPATION LIMITATIONS: cleaning, laundry, shopping, and community activity  PERSONAL  FACTORS: Age, Past/current experiences, Time  since onset of injury/illness/exacerbation, and 3+ comorbidities: DM, HTN, and colon CA  are also affecting patient's functional outcome.   REHAB POTENTIAL: Good  CLINICAL DECISION MAKING: Unstable/unpredictable  EVALUATION COMPLEXITY: High   GOALS: Goals reviewed with patient? Yes  SHORT TERM GOALS: Target date: 02/17/2023  Pt will be independent with HEP in order to improve strength and balance in order to decrease fall risk and improve function at home. Baseline:  Goal status: INITIAL   LONG TERM GOALS: Target date: 03/17/2023  Pt will increase FOTO to at least 54 to demonstrate significant improvement in function at home related to balance  Baseline: 01/20/23: 48 Goal status: INITIAL  2.  Pt will improve BERG by at least 3 points in order to demonstrate clinically significant improvement in balance.   Baseline: 01/20/23: To be completed; 01/22/23: 42/56; Goal status: INITIAL  3.  Pt will improve ABC by at least 13% in order to demonstrate clinically significant improvement in balance confidence.      Baseline: 01/20/23: To be completed; 01/22/23: 34.4%; Goal status: INITIAL  4. Pt will decrease 5TSTS by at least 3 seconds in order to demonstrate clinically significant improvement in LE strength      Baseline: 01/20/23: 17.3s Goal status: INITIAL  5. Pt will decrease TUG to below 14 seconds/decrease in order to demonstrate decreased fall risk.  Baseline: 01/20/23: 17.5s Goal status: INITIAL  PLAN: PT FREQUENCY: 2x/week  PT DURATION: 8 weeks  PLANNED INTERVENTIONS: Therapeutic exercises, Therapeutic activity, Neuromuscular re-education, Balance training, Gait training, Patient/Family education, Self Care, Joint mobilization, Joint manipulation, Vestibular training, Canalith repositioning, Orthotic/Fit training, DME instructions, Dry Needling, Electrical stimulation, Spinal manipulation, Spinal mobilization, Cryotherapy, Moist heat, Taping,  Traction, Ultrasound, Ionotophoresis 4mg /ml Dexamethasone, Manual therapy, and Re-evaluation.  PLAN FOR NEXT SESSION: progress balance/strength exercises, review/modify HEP as needed;   Lyndel Safe Deandre Stansel PT, DPT, GCS  Elric Tirado 02/13/2023, 9:50 PM

## 2023-02-17 ENCOUNTER — Encounter: Payer: Self-pay | Admitting: Internal Medicine

## 2023-02-17 ENCOUNTER — Ambulatory Visit: Payer: Medicare Other | Attending: Internal Medicine

## 2023-02-17 DIAGNOSIS — R2681 Unsteadiness on feet: Secondary | ICD-10-CM | POA: Diagnosis not present

## 2023-02-17 NOTE — Therapy (Signed)
OUTPATIENT PHYSICAL THERAPY BALANCE TREATMENT  Patient Name: DESHONNA MARCANO MRN: [id number] DOB:04-11-1946, 77 y.o., female Today's Date: 02/18/2023  END OF SESSION:  PT End of Session - 02/17/23 1026     Visit Number 9    Number of Visits 17    Date for PT Re-Evaluation 03/17/23    Authorization Type eval: 01/20/23    PT Start Time 1015    PT Stop Time 1100    PT Time Calculation (min) 45 min    Equipment Utilized During Treatment Gait belt    Activity Tolerance Patient tolerated treatment well    Behavior During Therapy WFL for tasks assessed/performed            Past Medical History:  Diagnosis Date   Allergy    Arthritis of knee, degenerative 05/08/2015   Basal cell carcinoma 03/2019   nose   Colon cancer 2014   Partial colon resection, chemo + rad tx's.    Complication of anesthesia    Diabetes mellitus without complication    Pt takes Metformin.   Hyperlipidemia    Hypertension    PONV (postoperative nausea and vomiting)    SBO (small bowel obstruction) 04/20/2018   Seizures    Umbilical hernia with obstruction but no gangrene    Vitamin D deficiency    Past Surgical History:  Procedure Laterality Date   CATARACT EXTRACTION, BILATERAL  01/2020   Dr. Lucianne Lei [facility]   COLOSTOMY REVERSAL  01/2014   ILEOSTOMY  [id number]   UMBILICAL HERNIA REPAIR  05/2018   Patient Active Problem List   Diagnosis Date Noted   Stage 3a chronic kidney disease (CKD) 12/30/2022   Trigger finger, left ring finger 12/27/2021   Primary osteoarthritis of both hips 06/10/2019   Bilateral carotid artery stenosis 08/24/2018   Atherosclerosis of aorta 08/24/2018   Type II diabetes mellitus with complication [id number]   Bradycardia 07/24/2018   Neoplasm of uncertain behavior of skin 01/16/2017   Senile ecchymosis 08/20/2015   Essential (primary) hypertension 05/08/2015   History of rectal cancer 05/08/2015   Hyperlipidemia associated with type 2 diabetes mellitus 05/08/2015   Seizure  disorder 05/08/2015   Shoulder strain 05/08/2015   Avitaminosis D 05/08/2015   PCP: Glean Hess, MD  REFERRING PROVIDER: Glean Hess, MD   REFERRING DIAG: R26.9 (ICD-10-CM) - Gait disturbance   RATIONALE FOR EVALUATION AND TREATMENT: Rehabilitation  THERAPY DIAG: Unsteadiness on feet  ONSET DATE: 01/17/22 (approximate)  FOLLOW-UP APPT SCHEDULED WITH REFERRING PROVIDER: Yes (in 4 months per patient)  FROM INITIAL EVALUATION (01/20/23) SUBJECTIVE:  SUBJECTIVE STATEMENT:  Unsteadiness  PERTINENT HISTORY:  Pt complains of intermittent difficulty with her balance over the period of multiple months. She fell 6 months ago and fractured multiple ribs. She has noticed when walking across the grass she needs additional support. She uses cart when at the grocery store. She reports some urinary and bowel urge incontinence.    Pain: Yes, knee, back, and shoulder pain; Numbness/Tingling: Yes, occasional numbness in L foot and R hand. 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  Focal Weakness: No, generalized LE weakness Recent changes in overall health/medication: No Prior history of physical therapy for balance:  No Dominant hand: left Red flags: Pt reports some nighttime urinary incontinence and some daily bowel issues, history of colon cancer, occasional chills; Denies h/o spinal tumors, h/o compression fx, abdominal pain, fever, night sweats, nausea, vomiting;  PRECAUTIONS: None  WEIGHT BEARING RESTRICTIONS: No  FALLS: Has patient fallen in last 6 months? Yes. Number of falls 1  (a couple falls before that)  Living Environment Lives with: lives with their family, with daughter Lives in: House/apartment, one level Stairs: Yes: External: 3 steps; on right going up Has following equipment at home: Environmental consultant - 2 wheeled,  Shower bench, Grab bars, and handicap height commode  Prior level of function: Independent with basic ADLs, dtr provides some assist IADLs  Occupational demands: Retired, worked in [employer]  Hobbies: [place], watching television, crafts;  Patient Goals: Pt would like to remain independent as long as possible  OBJECTIVE:  Patient Surveys  FOTO: 48, predicted improvement to 74;  ABC: To be completed;  Cognition Patient is oriented to person, place, and time.  Recent memory and remote memory appear slightly impaired; Attention span and concentration are intact.  Expressive speech is intact.  Patient's fund of knowledge is within normal limits for educational level.    Gross Musculoskeletal Assessment Tremor: None Bulk: Normal Tone: Normal  Posture: Forward head and rounded shoulders  LE MMT: MMT (out of 5) Right  Left   Hip flexion 4+ 4+  Hip extension    Hip abduction    Hip adduction    Hip internal rotation    Hip external rotation    Knee flexion 5 5  Knee extension 5 5  Ankle dorsiflexion 4 4  Ankle plantarflexion    Ankle inversion    Ankle eversion    (* = pain; Blank rows = not tested)  Transfers: Assistive device utilized: None  Sit to stand: Complete Independence Stand to sit: Complete Independence Chair to chair: Complete Independence Floor:  Not tested  Stairs: Level of Assistance: Modified independence Stair Negotiation Technique: Step to Pattern with Bilateral Rails Number of Stairs: 4  Height of Stairs: 6"  Comments: Decreased speed with step-to pattern;  Gait: Gait pattern: step through pattern, decreased step length- Right, and decreased step length- Left Distance walked: 100' Assistive device utilized: None Level of assistance: Complete Independence Comments: Decreased self-selected gait speed  Functional Outcome Measures  01/20/23 01/22/23 Comments  BERG  42/56 Increased risk for fall, in need of intervention  DGI     FGA     TUG 17.5  seconds  Increased risk for fall, in need of intervention  5TSTS 17.3 seconds  Increased risk for fall, in need of intervention  6 Minute Walk Test     10 Meter Gait Speed Self-selected: 17.0s = 0.59 m/s; Fastest: 12.4s = 0.81 m/s  Below threshold for full community mobility  (Blank rows = not tested)  TODAY'S TREATMENT   SUBJECTIVE: Pt reports that she is doing well today. No updates since the last therapy session. No specific pain reported upon arrival. She is performing her HEP. No recent falls. No specific questions or concerns.   PAIN: Denies   Ther-ex  NuStep L1-4 x 10 minutes for LE strengthening with therapist adjusting resistance and monitoring fatigue, interval history obtained; Double black tband resisted gait forward, backward, R lateral, and L lateral x 5 each direction;  Standing hip strengthening with 5# ankle weights (AW) in // bars: Hip flexion marches x 15 BLE; HS curls x 15 BLE; Hip abduction x 15 BLE; Hip extension x 15 BLE;  Seated LAQ with 5# AW 2 x 10 BLE; Side stepping with 5# AW x 6 lengths;   Neuromuscular Re-education  Airex alternating 6" step taps x 10 BLE; Airex staggered stance balance with front foot on 6" step alternating forward LE x 30s each; Airex feet apart eyes open with horizontal and vertical head turns x 30s each; Airex balance beam side stepping x multiple lengths;    Not performed: Stair ascend/descend with reciprocal pattern up and step-to pattern down 4 steps x 4;  Forward 6" step ups alternting LE x 10 toward each side; Lateral 6" step ups x 10 toward each side; Sit to stand with 6# overhead med ball press 2 x 10; Seated clams with manual resistance from therapist x 10 BLE; Seated adductor squeezes with manual resistance from therapist x 10 BLE; Ladder side stepping 12' x multiple lengths; Tandem balance alternating forward LE x 30s each; Airex feet apart eyes open/closed x 30s each; Clock stepping x multiple bouts with  each leg; Tandem gait in // bars without UE support x 2 lengths; Forward 6" hurdle stepping obstacle course x multiple lengths; Lateral 6" hurdle stepping obstacle course x multiple lengths; Standing heel/toe raises with BUE support x 10 BLE;   PATIENT EDUCATION:  Education details: Pt educated throughout session about proper posture and technique with exercises. Improved exercise technique, movement at target joints, use of target muscles after min to mod verbal, visual, tactile cues. Balance exercises Person educated: Patient Education method: Explanation, Demonstration, Verbal cues, and Handouts Education comprehension: verbalized understanding, returned demonstration, and verbal cues required   HOME EXERCISE PROGRAM:  Access Code: [email or web address] URL: [email or web address] Date: 01/22/2023 Prepared by: Roxana Hires  Exercises - Sit to Stand Without Arm Support  - 1 x daily - 7 x weekly - 2 sets - 10 reps - Standing Hip Abduction with Counter Support  - 1 x daily - 7 x weekly - 2 sets - 10 reps - 3s hold - Heel Raises with Counter Support  - 1 x daily - 7 x weekly - 2 sets - 10 reps - 3s hold - Standing March with Counter Support  - 1 x daily - 7 x weekly - 2 sets - 10 reps - 3s hold - Corner Balance Feet Together: Eyes Open With Head Turns  - 1 x daily - 7 x weekly - 3 reps - 30s hold   ASSESSMENT:  CLINICAL IMPRESSION: Patient demonstrates excellent motivation during session today. Progressed strengthening and balance exercises during session today. She continues to require intermittent rest breaks secondary to fatigue. No LOB noted during therapeutic interventions this session. Overall balance and strength appear to be improving but will update outcome measures at next visit to test and track progress. Pt will benefit from PT services to address deficits in strength, balance, and mobility in  order to return to full function at home and decrease her risk for falls.    OBJECTIVE IMPAIRMENTS: decreased balance, difficulty walking, and decreased strength.   ACTIVITY LIMITATIONS: squatting, stairs, and transfers  PARTICIPATION LIMITATIONS: cleaning, laundry, shopping, and community activity  PERSONAL FACTORS: Age, Past/current experiences, Time since onset of injury/illness/exacerbation, and 3+ comorbidities: DM, HTN, and colon CA  are also affecting patient's functional outcome.   REHAB POTENTIAL: Good  CLINICAL DECISION MAKING: Unstable/unpredictable  EVALUATION COMPLEXITY: High   GOALS: Goals reviewed with patient? Yes  SHORT TERM GOALS: Target date: 02/17/2023  Pt will be independent with HEP in order to improve strength and balance in order to decrease fall risk and improve function at home. Baseline:  Goal status: INITIAL   LONG TERM GOALS: Target date: 03/17/2023  Pt will increase FOTO to at least 54 to demonstrate significant improvement in function at home related to balance  Baseline: 01/20/23: 48 Goal status: INITIAL  2.  Pt will improve BERG by at least 3 points in order to demonstrate clinically significant improvement in balance.   Baseline: 01/20/23: To be completed; 01/22/23: 42/56; Goal status: INITIAL  3.  Pt will improve ABC by at least 13% in order to demonstrate clinically significant improvement in balance confidence.      Baseline: 01/20/23: To be completed; 01/22/23: 34.4%; Goal status: INITIAL  4. Pt will decrease 5TSTS by at least 3 seconds in order to demonstrate clinically significant improvement in LE strength      Baseline: 01/20/23: 17.3s Goal status: INITIAL  5. Pt will decrease TUG to below 14 seconds/decrease in order to demonstrate decreased fall risk.  Baseline: 01/20/23: 17.5s Goal status: INITIAL  PLAN: PT FREQUENCY: 2x/week  PT DURATION: 8 weeks  PLANNED INTERVENTIONS: Therapeutic exercises, Therapeutic activity, Neuromuscular re-education, Balance training, Gait training, Patient/Family education, Self  Care, Joint mobilization, Joint manipulation, Vestibular training, Canalith repositioning, Orthotic/Fit training, DME instructions, Dry Needling, Electrical stimulation, Spinal manipulation, Spinal mobilization, Cryotherapy, Moist heat, Taping, Traction, Ultrasound, Ionotophoresis 4mg /ml Dexamethasone, Manual therapy, and Re-evaluation.  PLAN FOR NEXT SESSION: update outcome measures/goals, progress note, progress balance/strength exercises, review/modify HEP as needed;   Lyndel Safe Merranda Bolls PT, DPT, GCS  Janika Jedlicka 02/18/2023, 9:55 AM

## 2023-02-18 NOTE — Therapy (Signed)
OUTPATIENT PHYSICAL THERAPY BALANCE TREATMENT/PROGRESS NOTE  Dates of reporting period  01/20/23   to   02/19/23   Patient Name: [name] MRN: [id number] DOB:[birth year], 77 y.o., female Today's Date: 02/20/2023  END OF SESSION:  PT End of Session - 02/19/23 1016     Visit Number 10    Number of Visits 17    Date for PT Re-Evaluation 03/17/23    Authorization Type eval: 01/20/23    PT Start Time 1016    PT Stop Time 1100    PT Time Calculation (min) 44 min    Equipment Utilized During Treatment Gait belt    Activity Tolerance Patient tolerated treatment well    Behavior During Therapy WFL for tasks assessed/performed            Past Medical History:  Diagnosis Date   Allergy    Arthritis of knee, degenerative 05/08/2015   Basal cell carcinoma 03/2019   nose   Colon cancer 2014   Partial colon resection, chemo + rad tx's.    Complication of anesthesia    Diabetes mellitus without complication    Pt takes Metformin.   Hyperlipidemia    Hypertension    PONV (postoperative nausea and vomiting)    SBO (small bowel obstruction) 04/20/2018   Seizures    Umbilical hernia with obstruction but no gangrene    Vitamin D deficiency    Past Surgical History:  Procedure Laterality Date   CATARACT EXTRACTION, BILATERAL  01/2020   Dr. Zenaida Niece [facility]   COLOSTOMY REVERSAL  01/2014   ILEOSTOMY  05/2013   UMBILICAL HERNIA REPAIR  05/2018   Patient Active Problem List   Diagnosis Date Noted   Stage 3a chronic kidney disease (CKD) 12/30/2022   Trigger finger, left ring finger 12/27/2021   Primary osteoarthritis of both hips 06/10/2019   Bilateral carotid artery stenosis 08/24/2018   Atherosclerosis of aorta 08/24/2018   Type II diabetes mellitus with complication 08/18/2018   Bradycardia 07/24/2018   Neoplasm of uncertain behavior of skin 01/16/2017   Senile ecchymosis 08/20/2015   Essential (primary) hypertension 05/08/2015   History of rectal cancer 05/08/2015   Hyperlipidemia  associated with type 2 diabetes mellitus 05/08/2015   Seizure disorder 05/08/2015   Shoulder strain 05/08/2015   Avitaminosis D 05/08/2015   PCP: Reubin Milan, MD  REFERRING PROVIDER: Reubin Milan, MD   REFERRING DIAG: R26.9 (ICD-10-CM) - Gait disturbance   RATIONALE FOR EVALUATION AND TREATMENT: Rehabilitation  THERAPY DIAG: Unsteadiness on feet  ONSET DATE: 01/17/22 (approximate)  FOLLOW-UP APPT SCHEDULED WITH REFERRING PROVIDER: Yes (in 4 months per patient)  FROM INITIAL EVALUATION (01/20/23) SUBJECTIVE:  SUBJECTIVE STATEMENT:  Unsteadiness  PERTINENT HISTORY:  Pt complains of intermittent difficulty with her balance over the period of multiple months. She fell 6 months ago and fractured multiple ribs. She has noticed when walking across the grass she needs additional support. She uses cart when at the grocery store. She reports some urinary and bowel urge incontinence.    Pain: Yes, knee, back, and shoulder pain; Numbness/Tingling: Yes, occasional numbness in L foot and R hand. 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  Focal Weakness: No, generalized LE weakness Recent changes in overall health/medication: No Prior history of physical therapy for balance:  No Dominant hand: left Red flags: Pt reports some nighttime urinary incontinence and some daily bowel issues, history of colon cancer, occasional chills; Denies h/o spinal tumors, h/o compression fx, abdominal pain, fever, night sweats, nausea, vomiting;  PRECAUTIONS: None  WEIGHT BEARING RESTRICTIONS: No  FALLS: Has patient fallen in last 6 months? Yes. Number of falls 1  (a couple falls before that)  Living Environment Lives with: lives with their family, with daughter Lives in: House/apartment, one level Stairs: Yes: External: 3 steps; on right going  up Has following equipment at home: Environmental consultant - 2 wheeled, Shower bench, Grab bars, and handicap height commode  Prior level of function: Independent with basic ADLs, dtr provides some assist IADLs  Occupational demands: Retired, worked in [employer]  Hobbies: [place], watching television, crafts;  Patient Goals: Pt would like to remain independent as long as possible  OBJECTIVE:  Patient Surveys  FOTO: 48, predicted improvement to 76;  ABC: To be completed;  Cognition Patient is oriented to person, place, and time.  Recent memory and remote memory appear slightly impaired; Attention span and concentration are intact.  Expressive speech is intact.  Patient's fund of knowledge is within normal limits for educational level.    Gross Musculoskeletal Assessment Tremor: None Bulk: Normal Tone: Normal  Posture: Forward head and rounded shoulders  LE MMT: MMT (out of 5) Right  Left   Hip flexion 4+ 4+  Hip extension    Hip abduction    Hip adduction    Hip internal rotation    Hip external rotation    Knee flexion 5 5  Knee extension 5 5  Ankle dorsiflexion 4 4  Ankle plantarflexion    Ankle inversion    Ankle eversion    (* = pain; Blank rows = not tested)  Transfers: Assistive device utilized: None  Sit to stand: Complete Independence Stand to sit: Complete Independence Chair to chair: Complete Independence Floor:  Not tested  Stairs: Level of Assistance: Modified independence Stair Negotiation Technique: Step to Pattern with Bilateral Rails Number of Stairs: 4  Height of Stairs: 6"  Comments: Decreased speed with step-to pattern;  Gait: Gait pattern: step through pattern, decreased step length- Right, and decreased step length- Left Distance walked: 100' Assistive device utilized: None Level of assistance: Complete Independence Comments: Decreased self-selected gait speed  Functional Outcome Measures  01/20/23 01/22/23 Comments  BERG  42/56 Increased risk for  fall, in need of intervention  DGI     FGA     TUG 17.5 seconds  Increased risk for fall, in need of intervention  5TSTS 17.3 seconds  Increased risk for fall, in need of intervention  6 Minute Walk Test     10 Meter Gait Speed Self-selected: 17.0s = 0.59 m/s; Fastest: 12.4s = 0.81 m/s  Below threshold for full community mobility  (Blank rows = not tested)  TODAY'S TREATMENT   SUBJECTIVE: Pt reports that she is doing well today. No updates since the last therapy session. No specific pain reported upon arrival. She is performing her HEP. No recent falls. No specific questions or concerns.   PAIN: Denies   Ther-ex  NuStep L1-4 x 10 minutes for LE strengthening with therapist adjusting resistance and monitoring fatigue, interval history obtained;  Standing hip strengthening with 5# ankle weights (AW) in // bars: Hip flexion marches x 15 BLE; HS curls x 15 BLE; Hip abduction x 15 BLE; Hip extension x 15 BLE;  Seated LAQ with 5# AW 2 x 15 BLE;   Neuromuscular Re-education  Updated outcome measures BERG, TUG, 5TSTS, FOTO, ABC, (see goal section for results)   Not performed: Airex alternating 6" step taps x 10 BLE; Airex staggered stance balance with front foot on 6" step alternating forward LE x 30s each; Airex feet apart eyes open with horizontal and vertical head turns x 30s each; Airex balance beam side stepping x multiple lengths; Double black tband resisted gait forward, backward, R lateral, and L lateral x 5 each direction; Stair ascend/descend with reciprocal pattern up and step-to pattern down 4 steps x 4;  Forward 6" step ups alternting LE x 10 toward each side; Lateral 6" step ups x 10 toward each side; Sit to stand with 6# overhead med ball press 2 x 10; Seated clams with manual resistance from therapist x 10 BLE; Seated adductor squeezes with manual resistance from therapist x 10 BLE; Ladder side stepping 12' x multiple lengths; Tandem balance alternating  forward LE x 30s each; Airex feet apart eyes open/closed x 30s each; Clock stepping x multiple bouts with each leg; Tandem gait in // bars without UE support x 2 lengths; Forward 6" hurdle stepping obstacle course x multiple lengths; Lateral 6" hurdle stepping obstacle course x multiple lengths; Standing heel/toe raises with BUE support x 10 BLE; Side stepping with 5# AW x 6 lengths;   PATIENT EDUCATION:  Education details: Pt educated throughout session about proper posture and technique with exercises. Improved exercise technique, movement at target joints, use of target muscles after min to mod verbal, visual, tactile cues. Balance exercises Person educated: Patient Education method: Explanation, Demonstration, Verbal cues, and Handouts Education comprehension: verbalized understanding, returned demonstration, and verbal cues required   HOME EXERCISE PROGRAM:  Access Code: [email or web address] URL: [email or web address] Date: 01/22/2023 Prepared by: Ria Comment  Exercises - Sit to Stand Without Arm Support  - 1 x daily - 7 x weekly - 2 sets - 10 reps - Standing Hip Abduction with Counter Support  - 1 x daily - 7 x weekly - 2 sets - 10 reps - 3s hold - Heel Raises with Counter Support  - 1 x daily - 7 x weekly - 2 sets - 10 reps - 3s hold - Standing March with Counter Support  - 1 x daily - 7 x weekly - 2 sets - 10 reps - 3s hold - Corner Balance Feet Together: Eyes Open With Head Turns  - 1 x daily - 7 x weekly - 3 reps - 30s hold   ASSESSMENT:  CLINICAL IMPRESSION: Patient demonstrates excellent motivation during session today. Updated outcome measures/goals with patient. Her FOTO improved to 49 and her ABC increased to 34.4% to 51.3%. Patient's balance improved with her BERG increasing from 42/56 at the initial evaluation to 49/56 today. Her TUG also improved from 17.5s to 12.9s and her 5TSTS improved from 17.3s to 12.4s.  Progressed strengthening and balance exercises  during session today. She continues to require intermittent rest breaks secondary to fatigue. Overall balance and strength are improving. Pt will benefit from PT services to address deficits in strength, balance, and mobility in order to return to full function at home and decrease her risk for falls.   OBJECTIVE IMPAIRMENTS: decreased balance, difficulty walking, and decreased strength.   ACTIVITY LIMITATIONS: squatting, stairs, and transfers  PARTICIPATION LIMITATIONS: cleaning, laundry, shopping, and community activity  PERSONAL FACTORS: Age, Past/current experiences, Time since onset of injury/illness/exacerbation, and 3+ comorbidities: DM, HTN, and colon CA  are also affecting patient's functional outcome.   REHAB POTENTIAL: Good  CLINICAL DECISION MAKING: Unstable/unpredictable  EVALUATION COMPLEXITY: High   GOALS: Goals reviewed with patient? Yes  SHORT TERM GOALS: Target date: 02/17/2023  Pt will be independent with HEP in order to improve strength and balance in order to decrease fall risk and improve function at home. Baseline:  Goal status: PARTIALLY MET   LONG TERM GOALS: Target date: 03/17/2023  Pt will increase FOTO to at least 54 to demonstrate significant improvement in function at home related to balance  Baseline: 01/20/23: 48; 02/19/23: 49; Goal status: PARTIALLY MET  2.  Pt will improve BERG to at least 52/56 in order to demonstrate clinically significant improvement in balance.   Baseline: 01/20/23: To be completed; 01/22/23: 42/56; 02/19/23: 49/56; Goal status: REVISED  3.  Pt will improve ABC to at least 67% in order to demonstrate clinically significant improvement in balance confidence.      Baseline: 01/20/23: To be completed; 01/22/23: 34.4%; 02/19/23: 51.3% Goal status: REVISED  4. Pt will decrease 5TSTS by at least 3 seconds in order to demonstrate clinically significant improvement in LE strength      Baseline: 01/20/23: 17.3s; 02/19/23: 12.4s Goal status:  ACHIEVED  5. Pt will decrease TUG to below 14 seconds/decrease in order to demonstrate decreased fall risk.  Baseline: 01/20/23: 17.5s; 02/19/23: 12.9s Goal status: ACHIEVED  PLAN: PT FREQUENCY: 2x/week  PT DURATION: 8 weeks  PLANNED INTERVENTIONS: Therapeutic exercises, Therapeutic activity, Neuromuscular re-education, Balance training, Gait training, Patient/Family education, Self Care, Joint mobilization, Joint manipulation, Vestibular training, Canalith repositioning, Orthotic/Fit training, DME instructions, Dry Needling, Electrical stimulation, Spinal manipulation, Spinal mobilization, Cryotherapy, Moist heat, Taping, Traction, Ultrasound, Ionotophoresis 4mg /ml Dexamethasone, Manual therapy, and Re-evaluation.  PLAN FOR NEXT SESSION: progress balance/strength exercises, review/modify HEP as needed;   Sharalyn Ink Marsheila Alejo PT, DPT, GCS  Jamarria Real 02/20/2023, 1:38 PM

## 2023-02-19 ENCOUNTER — Ambulatory Visit: Payer: Medicare Other

## 2023-02-19 DIAGNOSIS — R2681 Unsteadiness on feet: Secondary | ICD-10-CM

## 2023-02-20 ENCOUNTER — Other Ambulatory Visit: Payer: Self-pay | Admitting: Internal Medicine

## 2023-02-20 NOTE — Telephone Encounter (Signed)
Requested Prescriptions  Pending Prescriptions Disp Refills   ONETOUCH ULTRA test strip [Pharmacy Med Name: OneTouch Ultra Blue In Vitro Strip] 100 each 0    Sig: USE TO TEST BLOOD SUGAR 1-2 TIMES DAILY     Endocrinology: Diabetes - Testing Supplies Passed - 02/20/2023  6:50 AM      Passed - Valid encounter within last 12 months    Recent Outpatient Visits           1 month ago Annual physical exam   [place] [facility] at [facility], Nyoka Cowden, MD   4 months ago Type II diabetes mellitus with complication [facility])   [place] [facility] at [facility], Nyoka Cowden, MD   5 months ago Acute cough   [place] [facility] at [facility], Nyoka Cowden, MD   7 months ago Multiple rib fractures involving four or more ribs   [facility] at [facility], Nyoka Cowden, MD   1 year ago Annual physical exam   [facility] at [facility], Nyoka Cowden, MD       Future Appointments             In 2 months Judithann Graves, Nyoka Cowden, MD [facility] at [facility], [facility]

## 2023-02-24 ENCOUNTER — Ambulatory Visit: Payer: Medicare Other

## 2023-02-24 DIAGNOSIS — R2681 Unsteadiness on feet: Secondary | ICD-10-CM

## 2023-02-24 NOTE — Therapy (Signed)
OUTPATIENT PHYSICAL THERAPY BALANCE TREATMENT  Patient Name: ANARIE CAL MRN: [id number] DOB:1946-09-30, 77 y.o., female Today's Date: 02/24/2023  END OF SESSION:  PT End of Session - 02/24/23 1018     Visit Number 11    Number of Visits 17    Date for PT Re-Evaluation 03/17/23    Authorization Type eval: 01/20/23    PT Start Time 1019    PT Stop Time 1100    PT Time Calculation (min) 41 min    Equipment Utilized During Treatment Gait belt    Activity Tolerance Patient tolerated treatment well    Behavior During Therapy WFL for tasks assessed/performed            Past Medical History:  Diagnosis Date   Allergy    Arthritis of knee, degenerative 05/08/2015   Basal cell carcinoma 03/2019   nose   Colon cancer 2014   Partial colon resection, chemo + rad tx's.    Complication of anesthesia    Diabetes mellitus without complication    Pt takes Metformin.   Hyperlipidemia    Hypertension    PONV (postoperative nausea and vomiting)    SBO (small bowel obstruction) 04/20/2018   Seizures    Umbilical hernia with obstruction but no gangrene    Vitamin D deficiency    Past Surgical History:  Procedure Laterality Date   CATARACT EXTRACTION, BILATERAL  01/2020   Dr. Zenaida Niece [facility]   COLOSTOMY REVERSAL  01/2014   ILEOSTOMY  05/2013   UMBILICAL HERNIA REPAIR  05/2018   Patient Active Problem List   Diagnosis Date Noted   Stage 3a chronic kidney disease (CKD) 12/30/2022   Trigger finger, left ring finger 12/27/2021   Primary osteoarthritis of both hips 06/10/2019   Bilateral carotid artery stenosis 08/24/2018   Atherosclerosis of aorta 08/24/2018   Type II diabetes mellitus with complication 08/18/2018   Bradycardia 07/24/2018   Neoplasm of uncertain behavior of skin 01/16/2017   Senile ecchymosis 08/20/2015   Essential (primary) hypertension 05/08/2015   History of rectal cancer 05/08/2015   Hyperlipidemia associated with type 2 diabetes mellitus 05/08/2015   Seizure  disorder 05/08/2015   Shoulder strain 05/08/2015   Avitaminosis D 05/08/2015   PCP: Reubin Milan, MD  REFERRING PROVIDER: Reubin Milan, MD   REFERRING DIAG: R26.9 (ICD-10-CM) - Gait disturbance   RATIONALE FOR EVALUATION AND TREATMENT: Rehabilitation  THERAPY DIAG: Unsteadiness on feet  ONSET DATE: 01/17/22 (approximate)  FOLLOW-UP APPT SCHEDULED WITH REFERRING PROVIDER: Yes (in 4 months per patient)  FROM INITIAL EVALUATION (01/20/23) SUBJECTIVE:  SUBJECTIVE STATEMENT:  Unsteadiness  PERTINENT HISTORY:  Pt complains of intermittent difficulty with her balance over the period of multiple months. She fell 6 months ago and fractured multiple ribs. She has noticed when walking across the grass she needs additional support. She uses cart when at the grocery store. She reports some urinary and bowel urge incontinence.    Pain: Yes, knee, back, and shoulder pain; Numbness/Tingling: Yes, occasional numbness in L foot and R hand. 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  Focal Weakness: No, generalized LE weakness Recent changes in overall health/medication: No Prior history of physical therapy for balance:  No Dominant hand: left Red flags: Pt reports some nighttime urinary incontinence and some daily bowel issues, history of colon cancer, occasional chills; Denies h/o spinal tumors, h/o compression fx, abdominal pain, fever, night sweats, nausea, vomiting;  PRECAUTIONS: None  WEIGHT BEARING RESTRICTIONS: No  FALLS: Has patient fallen in last 6 months? Yes. Number of falls 1  (a couple falls before that)  Living Environment Lives with: lives with their family, with daughter Lives in: House/apartment, one level Stairs: Yes: External: 3 steps; on right going up Has following equipment at home: Environmental consultant - 2 wheeled,  Shower bench, Grab bars, and handicap height commode  Prior level of function: Independent with basic ADLs, dtr provides some assist IADLs  Occupational demands: Retired, worked in [employer]  Hobbies: [place], watching television, crafts;  Patient Goals: Pt would like to remain independent as long as possible  OBJECTIVE:  Patient Surveys  FOTO: 48, predicted improvement to 74;  ABC: To be completed;  Cognition Patient is oriented to person, place, and time.  Recent memory and remote memory appear slightly impaired; Attention span and concentration are intact.  Expressive speech is intact.  Patient's fund of knowledge is within normal limits for educational level.    Gross Musculoskeletal Assessment Tremor: None Bulk: Normal Tone: Normal  Posture: Forward head and rounded shoulders  LE MMT: MMT (out of 5) Right  Left   Hip flexion 4+ 4+  Hip extension    Hip abduction    Hip adduction    Hip internal rotation    Hip external rotation    Knee flexion 5 5  Knee extension 5 5  Ankle dorsiflexion 4 4  Ankle plantarflexion    Ankle inversion    Ankle eversion    (* = pain; Blank rows = not tested)  Transfers: Assistive device utilized: None  Sit to stand: Complete Independence Stand to sit: Complete Independence Chair to chair: Complete Independence Floor:  Not tested  Stairs: Level of Assistance: Modified independence Stair Negotiation Technique: Step to Pattern with Bilateral Rails Number of Stairs: 4  Height of Stairs: 6"  Comments: Decreased speed with step-to pattern;  Gait: Gait pattern: step through pattern, decreased step length- Right, and decreased step length- Left Distance walked: 100' Assistive device utilized: None Level of assistance: Complete Independence Comments: Decreased self-selected gait speed  Functional Outcome Measures  01/20/23 01/22/23 Comments  BERG  42/56 Increased risk for fall, in need of intervention  DGI     FGA     TUG 17.5  seconds  Increased risk for fall, in need of intervention  5TSTS 17.3 seconds  Increased risk for fall, in need of intervention  6 Minute Walk Test     10 Meter Gait Speed Self-selected: 17.0s = 0.59 m/s; Fastest: 12.4s = 0.81 m/s  Below threshold for full community mobility  (Blank rows = not tested)  TODAY'S TREATMENT   SUBJECTIVE: Pt reports that she is doing well today. No updates since the last therapy session. No specific pain reported upon arrival. She is performing her HEP. No recent falls. No specific questions or concerns.   PAIN: Denies   Ther-ex  Stair ascend/descend, reciprocal pattern 4 steps x 4 bouts; 6" forward step-ups alternating leading LE x 10 each; Sit to stand without UE support and 6# med ball overhead press 2 x 10; Seated clams with manual resistance from therapist x 10 BLE; Seated adductor squeezes with manual resistance from therapist x 10 BLE; Standing heel/toe raises with BUE support x 10 BLE; NuStep L1-4 x 5 minutes for LE strengthening with therapist adjusting resistance and monitoring fatigue;   Neuromuscular Re-education  Alternating 6" toe taps x 10 BLE; Staggered stance balance with front foot on 6" step alternating LE x 30s each; Airex alternating 6" toe taps x 10 BLE; Airex staggered stance balance with front foot on 6" step alternating LE x 30s each; Airex feet together (FT) eyes open/closed x 30s each; Airex FT horizontal and vertical head turns x 30s each;   Not performed: Airex balance beam side stepping x multiple lengths; Double black tband resisted gait forward, backward, R lateral, and L lateral x 5 each direction;  Lateral 6" step ups x 10 toward each side; Ladder side stepping 12' x multiple lengths; Tandem balance alternating forward LE x 30s each; Clock stepping x multiple bouts with each leg; Tandem gait in // bars without UE support x 2 lengths; Forward 6" hurdle stepping obstacle course x multiple lengths; Lateral 6"  hurdle stepping obstacle course x multiple lengths; Side stepping with 5# AW x 6 lengths; Standing hip strengthening with 5# ankle weights (AW) in // bars: Hip flexion marches x 15 BLE; HS curls x 15 BLE; Hip abduction x 15 BLE; Hip extension x 15 BLE; Seated LAQ with 5# AW 2 x 15 BLE;   PATIENT EDUCATION:  Education details: Pt educated throughout session about proper posture and technique with exercises. Improved exercise technique, movement at target joints, use of target muscles after min to mod verbal, visual, tactile cues. Balance exercises Person educated: Patient Education method: Explanation, Demonstration, Verbal cues, and Handouts Education comprehension: verbalized understanding, returned demonstration, and verbal cues required   HOME EXERCISE PROGRAM:  Access Code: [email or web address] URL: [email or web address] Date: 01/22/2023 Prepared by: Ria CommentJason Jaylei Fuerte  Exercises - Sit to Stand Without Arm Support  - 1 x daily - 7 x weekly - 2 sets - 10 reps - Standing Hip Abduction with Counter Support  - 1 x daily - 7 x weekly - 2 sets - 10 reps - 3s hold - Heel Raises with Counter Support  - 1 x daily - 7 x weekly - 2 sets - 10 reps - 3s hold - Standing March with Counter Support  - 1 x daily - 7 x weekly - 2 sets - 10 reps - 3s hold - Corner Balance Feet Together: Eyes Open With Head Turns  - 1 x daily - 7 x weekly - 3 reps - 30s hold   ASSESSMENT:  CLINICAL IMPRESSION: Patient demonstrates excellent motivation during session today. Progressed strengthening and balance exercises during session today. She continues to require intermittent rest breaks secondary to fatigue but her endurance is progressively improving. No HEP updates at this time. Plan to continue progressing both balance and strength exercises at future sessions. Pt will benefit from PT services to address deficits in strength, balance, and  mobility in order to return to full function at home and decrease her risk  for falls.   OBJECTIVE IMPAIRMENTS: decreased balance, difficulty walking, and decreased strength.   ACTIVITY LIMITATIONS: squatting, stairs, and transfers  PARTICIPATION LIMITATIONS: cleaning, laundry, shopping, and community activity  PERSONAL FACTORS: Age, Past/current experiences, Time since onset of injury/illness/exacerbation, and 3+ comorbidities: DM, HTN, and colon CA  are also affecting patient's functional outcome.   REHAB POTENTIAL: Good  CLINICAL DECISION MAKING: Unstable/unpredictable  EVALUATION COMPLEXITY: High   GOALS: Goals reviewed with patient? Yes  SHORT TERM GOALS: Target date: 02/17/2023  Pt will be independent with HEP in order to improve strength and balance in order to decrease fall risk and improve function at home. Baseline:  Goal status: PARTIALLY MET   LONG TERM GOALS: Target date: 03/17/2023  Pt will increase FOTO to at least 54 to demonstrate significant improvement in function at home related to balance  Baseline: 01/20/23: 48; 02/19/23: 49; Goal status: PARTIALLY MET  2.  Pt will improve BERG to at least 52/56 in order to demonstrate clinically significant improvement in balance.   Baseline: 01/20/23: To be completed; 01/22/23: 42/56; 02/19/23: 49/56; Goal status: REVISED  3.  Pt will improve ABC to at least 67% in order to demonstrate clinically significant improvement in balance confidence.      Baseline: 01/20/23: To be completed; 01/22/23: 34.4%; 02/19/23: 51.3% Goal status: REVISED  4. Pt will decrease 5TSTS by at least 3 seconds in order to demonstrate clinically significant improvement in LE strength      Baseline: 01/20/23: 17.3s; 02/19/23: 12.4s Goal status: ACHIEVED  5. Pt will decrease TUG to below 14 seconds/decrease in order to demonstrate decreased fall risk.  Baseline: 01/20/23: 17.5s; 02/19/23: 12.9s Goal status: ACHIEVED  PLAN: PT FREQUENCY: 2x/week  PT DURATION: 8 weeks  PLANNED INTERVENTIONS: Therapeutic exercises, Therapeutic activity,  Neuromuscular re-education, Balance training, Gait training, Patient/Family education, Self Care, Joint mobilization, Joint manipulation, Vestibular training, Canalith repositioning, Orthotic/Fit training, DME instructions, Dry Needling, Electrical stimulation, Spinal manipulation, Spinal mobilization, Cryotherapy, Moist heat, Taping, Traction, Ultrasound, Ionotophoresis 4mg /ml Dexamethasone, Manual therapy, and Re-evaluation.  PLAN FOR NEXT SESSION: progress balance/strength exercises, review/modify HEP as needed;   Sharalyn Ink Abishai Viegas PT, DPT, GCS  Conya Ellinwood 02/24/2023, 1:08 PM

## 2023-02-26 ENCOUNTER — Ambulatory Visit: Payer: Medicare Other

## 2023-02-26 DIAGNOSIS — R2681 Unsteadiness on feet: Secondary | ICD-10-CM | POA: Diagnosis not present

## 2023-02-26 NOTE — Therapy (Signed)
OUTPATIENT PHYSICAL THERAPY BALANCE TREATMENT  Patient Name: [name] MRN: [id number] DOB:01-Nov-1946, 77 y.o., female Today's Date: 02/26/2023  END OF SESSION:  PT End of Session - 02/26/23 1145     Visit Number 12    Number of Visits 17    Date for PT Re-Evaluation 03/17/23    PT Start Time 1022    PT Stop Time 1105    PT Time Calculation (min) 43 min    Equipment Utilized During Treatment Gait belt    Activity Tolerance Patient tolerated treatment well    Behavior During Therapy WFL for tasks assessed/performed             Past Medical History:  Diagnosis Date   Allergy    Arthritis of knee, degenerative 05/08/2015   Basal cell carcinoma 03/2019   nose   Colon cancer 2014   Partial colon resection, chemo + rad tx's.    Complication of anesthesia    Diabetes mellitus without complication    Pt takes Metformin.   Hyperlipidemia    Hypertension    PONV (postoperative nausea and vomiting)    SBO (small bowel obstruction) 04/20/2018   Seizures    Umbilical hernia with obstruction but no gangrene    Vitamin D deficiency    Past Surgical History:  Procedure Laterality Date   CATARACT EXTRACTION, BILATERAL  01/2020   Dr. Zenaida Niece [facility]   COLOSTOMY REVERSAL  01/2014   ILEOSTOMY  05/2013   UMBILICAL HERNIA REPAIR  05/2018   Patient Active Problem List   Diagnosis Date Noted   Stage 3a chronic kidney disease (CKD) 12/30/2022   Trigger finger, left ring finger 12/27/2021   Primary osteoarthritis of both hips 06/10/2019   Bilateral carotid artery stenosis 08/24/2018   Atherosclerosis of aorta 08/24/2018   Type II diabetes mellitus with complication 08/18/2018   Bradycardia 07/24/2018   Neoplasm of uncertain behavior of skin 01/16/2017   Senile ecchymosis 08/20/2015   Essential (primary) hypertension 05/08/2015   History of rectal cancer 05/08/2015   Hyperlipidemia associated with type 2 diabetes mellitus 05/08/2015   Seizure disorder 05/08/2015   Shoulder  strain 05/08/2015   Avitaminosis D 05/08/2015   PCP: Reubin Milan, MD  REFERRING PROVIDER: Reubin Milan, MD   REFERRING DIAG: R26.9 (ICD-10-CM) - Gait disturbance   RATIONALE FOR EVALUATION AND TREATMENT: Rehabilitation  THERAPY DIAG: Unsteadiness on feet  ONSET DATE: 01/17/22 (approximate)  FOLLOW-UP APPT SCHEDULED WITH REFERRING PROVIDER: Yes (in 4 months per patient)  FROM INITIAL EVALUATION (01/20/23) SUBJECTIVE:  SUBJECTIVE STATEMENT:  Unsteadiness  PERTINENT HISTORY:  Pt complains of intermittent difficulty with her balance over the period of multiple months. She fell 6 months ago and fractured multiple ribs. She has noticed when walking across the grass she needs additional support. She uses cart when at the grocery store. She reports some urinary and bowel urge incontinence.    Pain: Yes, knee, back, and shoulder pain; Numbness/Tingling: Yes, occasional numbness in L foot and R hand. 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  Focal Weakness: No, generalized LE weakness Recent changes in overall health/medication: No Prior history of physical therapy for balance:  No Dominant hand: left Red flags: Pt reports some nighttime urinary incontinence and some daily bowel issues, history of colon cancer, occasional chills; Denies h/o spinal tumors, h/o compression fx, abdominal pain, fever, night sweats, nausea, vomiting;  PRECAUTIONS: None  WEIGHT BEARING RESTRICTIONS: No  FALLS: Has patient fallen in last 6 months? Yes. 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  Recent memory and remote memory appear slightly impaired; Attention span and concentration are intact.  Expressive speech is intact.  Patient's fund of knowledge is within normal limits for educational level.    Gross Musculoskeletal Assessment Tremor: None Bulk: Normal Tone: Normal  Posture: Forward head and rounded shoulders  LE MMT: MMT (out of 5) Right  Left   Hip flexion 4+ 4+  Hip extension    Hip abduction    Hip adduction    Hip internal rotation    Hip external rotation    Knee flexion 5 5  Knee extension 5 5  Ankle dorsiflexion 4 4  Ankle plantarflexion    Ankle inversion    Ankle eversion    (* = pain; Blank rows = not tested)  Transfers: Assistive device utilized: None  Sit to stand: Complete Independence Stand to sit: Complete Independence Chair to chair: Complete Independence Floor:  Not tested  Stairs: Level of Assistance: Modified independence Stair Negotiation Technique: Step to Pattern with Bilateral Rails Number of Stairs: 4  Height of Stairs: 6"  Comments: Decreased speed with step-to pattern;  Gait: Gait pattern: step through pattern, decreased step length- Right, and decreased step length- Left Distance walked: 100' Assistive device utilized: None Level of assistance: Complete Independence Comments: Decreased self-selected gait speed  Functional Outcome Measures  01/20/23 01/22/23 Comments  BERG  42/56 Increased risk for fall, in need of intervention  DGI     FGA     TUG 17.5 seconds  Increased risk for fall, in  need of intervention  5TSTS 17.3 seconds  Increased risk for fall, in need of intervention  6 Minute Walk Test     10 Meter Gait Speed Self-selected: 17.0s = 0.59 m/s; Fastest: 12.4s = 0.81 m/s  Below threshold for full community mobility  (Blank rows = not tested)  TODAY'S TREATMENT   SUBJECTIVE: Pt reports that she is doing well today. Pt reported: " I have low confidence when I walk at home. I walk holding to my furniture and push shopping cart when shopping. I loose my balance many times during the week but I catch my self. Do you think I can get stronger at my edge?" Pt agreed to use cane at home and outdoor when recommended.  PAIN: Denies   Ther-ex:  10 mins Standing Hand Bike 2.42min F/B each= 5 mins ( Nu-step unavailable) STS 2 x 30secs Staggered STS RTB 3 x 30 secs Minin squats with RTB 2 x 60 secs  Gait Training: 8 mins Gait training with SPC on L side wit VC fro sequencing and postural correction.    NM-Re-Ed: 25 min Stair ascend/descend, reciprocal pattern 4 steps x 4 bouts; 6" forward step-ups alternating leading 2 x 30 secs 6" step forward ups LLE  and then RLE 2 x 30 secs each Side stepping with RTB on thigh 2 mins Back ward stepping with RTB on thigh x 2 mins  Not performed: Seated clams with manual resistance from therapist x 10 BLE; Seated adductor squeezes with manual resistance from therapist x 10 BLE; Standing heel/toe raises with BUE support x 10 BLE; NuStep L1-4 x 5 minutes for LE strengthening with therapist adjusting resistance and monitoring fatigue; Alternating 6" toe taps x 10 BLE; Staggered stance balance with front foot on 6" step alternating LE x 30s each; Airex alternating 6" toe taps x 10 BLE; Airex staggered stance balance with front foot on 6" step alternating LE x 30s each; Airex feet together (FT) eyes open/closed x 30s each; Airex FT horizontal and vertical head turns x 30s each;   Not performed: Airex balance beam side stepping x  multiple lengths; Double black tband resisted gait forward, backward, R lateral, and L lateral x 5 each direction;  Lateral 6" step ups x 10 toward each side; Ladder side stepping 12' x multiple lengths; Tandem balance alternating forward LE x 30s each; Clock stepping x multiple bouts with each leg; Tandem gait in // bars without UE support x 2 lengths; Forward 6" hurdle stepping obstacle course x multiple lengths; Lateral 6" hurdle stepping obstacle course x multiple lengths; Side stepping with 5# AW x 6 lengths; Standing hip strengthening with 5# ankle weights (AW) in // bars: Hip flexion marches x 15 BLE; HS curls x 15 BLE; Hip abduction x 15 BLE; Hip extension x 15 BLE; Seated LAQ with 5# AW 2 x 15 BLE;   PATIENT EDUCATION:  Education details: Pt educated throughout session about proper posture and technique with exercises. Improved exercise technique, movement at target joints, use of target muscles after min to mod verbal, visual, tactile cues. Balance exercises Person educated: Patient Education method: Explanation, Demonstration, Verbal cues, and Handouts Education comprehension: verbalized understanding, returned demonstration, and verbal cues required   HOME EXERCISE PROGRAM:  Access Code: [email or web address] URL: [email or web address] Date: 01/22/2023 Prepared by: Ria Comment  Exercises - Sit to Stand Without Arm Support  - 1 x daily - 7 x weekly - 2 sets - 10 reps - Standing Hip Abduction with Counter Support  - 1 x daily - 7 x weekly - 2 sets - 10 reps - 3s hold - Heel Raises with Counter Support  - 1 x daily - 7 x weekly - 2 sets - 10 reps - 3s hold - Standing March with Counter Support  - 1 x daily - 7  x weekly - 2 sets - 10 reps - 3s hold - Corner Balance Feet Together: Eyes Open With Head Turns  - 1 x daily - 7 x weekly - 3 reps - 30s hold   ASSESSMENT:  CLINICAL IMPRESSION: Patient demonstrates excellent motivation during session today. Continues with  strength, and balance activities especially balance in dynamic standing activities to promote functional safety. Pt will benefit from walking with cane to prevent LOB while pt improve hre balance to become Ind without Ad. Pt agrees to use a cane because pt felt more confident with her gait in the clinic. Pt educated about the significance of walking 30 min a day 5 x week with SPC  on cognition and balance. Pt to start walking 10 mins in home and will add 2 mins every week. Pt tol treatment well.  She continues to require intermittent rest breaks secondary to fatigue but her endurance is progressively improving. No HEP updates at this time. Plan to continue progressing both balance and strength exercises at future sessions. Pt will benefit from PT services to address deficits in strength, balance, and mobility in order to return to full function at home and decrease her risk for falls.   OBJECTIVE IMPAIRMENTS: decreased balance, difficulty walking, and decreased strength.   ACTIVITY LIMITATIONS: squatting, stairs, and transfers  PARTICIPATION LIMITATIONS: cleaning, laundry, shopping, and community activity  PERSONAL FACTORS: Age, Past/current experiences, Time since onset of injury/illness/exacerbation, and 3+ comorbidities: DM, HTN, and colon CA  are also affecting patient's functional outcome.   REHAB POTENTIAL: Good  CLINICAL DECISION MAKING: Unstable/unpredictable  EVALUATION COMPLEXITY: High   GOALS: Goals reviewed with patient? Yes  SHORT TERM GOALS: Target date: 02/17/2023  Pt will be independent with HEP in order to improve strength and balance in order to decrease fall risk and improve function at home. Baseline:  Goal status: PARTIALLY MET   LONG TERM GOALS: Target date: 03/17/2023  Pt will increase FOTO to at least 54 to demonstrate significant improvement in function at home related to balance  Baseline: 01/20/23: 48; 02/19/23: 49; Goal status: PARTIALLY MET  2.  Pt will improve  BERG to at least 52/56 in order to demonstrate clinically significant improvement in balance.   Baseline: 01/20/23: To be completed; 01/22/23: 42/56; 02/19/23: 49/56; Goal status: REVISED  3.  Pt will improve ABC to at least 67% in order to demonstrate clinically significant improvement in balance confidence.      Baseline: 01/20/23: To be completed; 01/22/23: 34.4%; 02/19/23: 51.3% Goal status: REVISED  4. Pt will decrease 5TSTS by at least 3 seconds in order to demonstrate clinically significant improvement in LE strength      Baseline: 01/20/23: 17.3s; 02/19/23: 12.4s Goal status: ACHIEVED  5. Pt will decrease TUG to below 14 seconds/decrease in order to demonstrate decreased fall risk.  Baseline: 01/20/23: 17.5s; 02/19/23: 12.9s Goal status: ACHIEVED  PLAN: PT FREQUENCY: 2x/week  PT DURATION: 8 weeks  PLANNED INTERVENTIONS: Therapeutic exercises, Therapeutic activity, Neuromuscular re-education, Balance training, Gait training, Patient/Family education, Self Care, Joint mobilization, Joint manipulation, Vestibular training, Canalith repositioning, Orthotic/Fit training, DME instructions, Dry Needling, Electrical stimulation, Spinal manipulation, Spinal mobilization, Cryotherapy, Moist heat, Taping, Traction, Ultrasound, Ionotophoresis 4mg /ml Dexamethasone, Manual therapy, and Re-evaluation.  PLAN FOR NEXT SESSION: progress balance/strength exercises, review/modify HEP as needed;   Patient Details  Name: [name] MRN: [id number] Date of Birth: 1946-03-19 Referring Provider:  Reubin Milan, MD  Encounter Date: 02/26/2023   Janet Berlin PT DPT 12:03 PM,02/26/23  [facility] [address] [place], [place], [address] Phone: [phone]   Fax:  [phone]

## 2023-03-02 NOTE — Therapy (Signed)
OUTPATIENT PHYSICAL THERAPY BALANCE TREATMENT  Patient Name: [name] MRN: [id number] DOB:[birth year], 77 y.o., female Today's Date: 03/03/2023  END OF SESSION:  PT End of Session - 03/03/23 1024     Visit Number 13    Number of Visits 17    Date for PT Re-Evaluation 03/17/23    Authorization Type eval: 01/20/23    PT Start Time 1017    PT Stop Time 1100    PT Time Calculation (min) 43 min    Equipment Utilized During Treatment Gait belt    Activity Tolerance Patient tolerated treatment well    Behavior During Therapy WFL for tasks assessed/performed            Past Medical History:  Diagnosis Date   Allergy    Arthritis of knee, degenerative 05/08/2015   Basal cell carcinoma 03/2019   nose   Colon cancer 2014   Partial colon resection, chemo + rad tx's.    Complication of anesthesia    Diabetes mellitus without complication    Pt takes Metformin.   Hyperlipidemia    Hypertension    PONV (postoperative nausea and vomiting)    SBO (small bowel obstruction) 04/20/2018   Seizures    Umbilical hernia with obstruction but no gangrene    Vitamin D deficiency    Past Surgical History:  Procedure Laterality Date   CATARACT EXTRACTION, BILATERAL  01/2020   Dr. Zenaida Niece [facility]   COLOSTOMY REVERSAL  01/2014   ILEOSTOMY  05/2013   UMBILICAL HERNIA REPAIR  05/2018   Patient Active Problem List   Diagnosis Date Noted   Stage 3a chronic kidney disease (CKD) 12/30/2022   Trigger finger, left ring finger 12/27/2021   Primary osteoarthritis of both hips 06/10/2019   Bilateral carotid artery stenosis 08/24/2018   Atherosclerosis of aorta 08/24/2018   Type II diabetes mellitus with complication 08/18/2018   Bradycardia 07/24/2018   Neoplasm of uncertain behavior of skin 01/16/2017   Senile ecchymosis 08/20/2015   Essential (primary) hypertension 05/08/2015   History of rectal cancer 05/08/2015   Hyperlipidemia associated with type 2 diabetes mellitus 05/08/2015   Seizure  disorder 05/08/2015   Shoulder strain 05/08/2015   Avitaminosis D 05/08/2015   PCP: Reubin Milan, MD  REFERRING PROVIDER: Reubin Milan, MD   REFERRING DIAG: R26.9 (ICD-10-CM) - Gait disturbance   RATIONALE FOR EVALUATION AND TREATMENT: Rehabilitation  THERAPY DIAG: Unsteadiness on feet  ONSET DATE: 01/17/22 (approximate)  FOLLOW-UP APPT SCHEDULED WITH REFERRING PROVIDER: Yes (in 4 months per patient)  FROM INITIAL EVALUATION (01/20/23) SUBJECTIVE:  SUBJECTIVE STATEMENT:  Unsteadiness  PERTINENT HISTORY:  Pt complains of intermittent difficulty with her balance over the period of multiple months. She fell 6 months ago and fractured multiple ribs. She has noticed when walking across the grass she needs additional support. She uses cart when at the grocery store. She reports some urinary and bowel urge incontinence.    Pain: Yes, knee, back, and shoulder pain; Numbness/Tingling: Yes, occasional numbness in L foot and R hand. 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  Focal Weakness: No, generalized LE weakness Recent changes in overall health/medication: No Prior history of physical therapy for balance:  No Dominant hand: left Red flags: Pt reports some nighttime urinary incontinence and some daily bowel issues, history of colon cancer, occasional chills; Denies h/o spinal tumors, h/o compression fx, abdominal pain, fever, night sweats, nausea, vomiting;  PRECAUTIONS: None  WEIGHT BEARING RESTRICTIONS: No  FALLS: Has patient fallen in last 6 months? Yes. Number of falls 1  (a couple falls before that)  Living Environment Lives with: lives with their family, with daughter Lives in: House/apartment, one level Stairs: Yes: External: 3 steps; on right going up Has following equipment at home: Environmental consultant - 2 wheeled,  Shower bench, Grab bars, and handicap height commode  Prior level of function: Independent with basic ADLs, dtr provides some assist IADLs  Occupational demands: Retired, worked in [employer]  Hobbies: [place], watching television, crafts;  Patient Goals: Pt would like to remain independent as long as possible  OBJECTIVE:  Patient Surveys  FOTO: 48, predicted improvement to 74;  ABC: To be completed;  Cognition Patient is oriented to person, place, and time.  Recent memory and remote memory appear slightly impaired; Attention span and concentration are intact.  Expressive speech is intact.  Patient's fund of knowledge is within normal limits for educational level.    Gross Musculoskeletal Assessment Tremor: None Bulk: Normal Tone: Normal  Posture: Forward head and rounded shoulders  LE MMT: MMT (out of 5) Right  Left   Hip flexion 4+ 4+  Hip extension    Hip abduction    Hip adduction    Hip internal rotation    Hip external rotation    Knee flexion 5 5  Knee extension 5 5  Ankle dorsiflexion 4 4  Ankle plantarflexion    Ankle inversion    Ankle eversion    (* = pain; Blank rows = not tested)  Transfers: Assistive device utilized: None  Sit to stand: Complete Independence Stand to sit: Complete Independence Chair to chair: Complete Independence Floor:  Not tested  Stairs: Level of Assistance: Modified independence Stair Negotiation Technique: Step to Pattern with Bilateral Rails Number of Stairs: 4  Height of Stairs: 6"  Comments: Decreased speed with step-to pattern;  Gait: Gait pattern: step through pattern, decreased step length- Right, and decreased step length- Left Distance walked: 100' Assistive device utilized: None Level of assistance: Complete Independence Comments: Decreased self-selected gait speed  Functional Outcome Measures  01/20/23 01/22/23 Comments  BERG  42/56 Increased risk for fall, in need of intervention  DGI     FGA     TUG 17.5  seconds  Increased risk for fall, in need of intervention  5TSTS 17.3 seconds  Increased risk for fall, in need of intervention  6 Minute Walk Test     10 Meter Gait Speed Self-selected: 17.0s = 0.59 m/s; Fastest: 12.4s = 0.81 m/s  Below threshold for full community mobility  (Blank rows = not tested)  TODAY'S TREATMENT   SUBJECTIVE: Pt reports that she is doing well today. She has felt very fatigued over the last couple days but does not have a specific cause. No specific questions currently.    PAIN: Chronic L shoulder pain, not related to current episode;   Ther-ex  NuStep L1-5 x 10 minutes for LE strengthening with therapist adjusting resistance and monitoring fatigue;  Standing hip strengthening with 5# ankle weights (AW) in // bars: Hip flexion marches x 15 BLE; HS curls x 15 BLE; Hip abduction x 15 BLE; Hip extension x 15 BLE;  Standing heel raises x 15; Side stepping with 5# AW x 6 lengths; Seated LAQ with 5# AW 2 x 15 BLE; Seated clams with manual resistance from therapist x 10 BLE; Seated adductor squeezes with manual resistance from therapist x 10 BLE;   Neuromuscular Re-education  Rockerboard balance in A/P and R/L orientation x mutiple trials in each orientation; Rockerboard anterior/posterior weight shifts in A/P orientation x 10 each direction; Forward 6" hurdle stepping obstacle course x multiple lengths; Lateral 6" hurdle stepping obstacle course x multiple lengths;   Not performed: Alternating 6" toe taps x 10 BLE; Staggered stance balance with front foot on 6" step alternating LE x 30s each; Airex alternating 6" toe taps x 10 BLE; Airex staggered stance balance with front foot on 6" step alternating LE x 30s each; Airex feet together (FT) eyes open/closed x 30s each; Airex FT horizontal and vertical head turns x 30s each; Airex balance beam side stepping x multiple lengths; Double black tband resisted gait forward, backward, R lateral, and L  lateral x 5 each direction;  Ladder side stepping 12' x multiple lengths; Tandem balance alternating forward LE x 30s each; Clock stepping x multiple bouts with each leg; Tandem gait in // bars without UE support x 2 lengths; Stair ascend/descend, reciprocal pattern 4 steps x 4 bouts;   PATIENT EDUCATION:  Education details: Pt educated throughout session about proper posture and technique with exercises. Improved exercise technique, movement at target joints, use of target muscles after min to mod verbal, visual, tactile cues. Balance exercises Person educated: Patient Education method: Explanation, Demonstration, Verbal cues, and Handouts Education comprehension: verbalized understanding, returned demonstration, and verbal cues required   HOME EXERCISE PROGRAM:  Access Code: [email or web address] URL: [email or web address] Date: 01/22/2023 Prepared by: Ria Comment  Exercises - Sit to Stand Without Arm Support  - 1 x daily - 7 x weekly - 2 sets - 10 reps - Standing Hip Abduction with Counter Support  - 1 x daily - 7 x weekly - 2 sets - 10 reps - 3s hold - Heel Raises with Counter Support  - 1 x daily - 7 x weekly - 2 sets - 10 reps - 3s hold - Standing March with Counter Support  - 1 x daily - 7 x weekly - 2 sets - 10 reps - 3s hold - Corner Balance Feet Together: Eyes Open With Head Turns  - 1 x daily - 7 x weekly - 3 reps - 30s hold   ASSESSMENT:  CLINICAL IMPRESSION: Patient demonstrates excellent motivation during session today. Progressed strengthening and balance exercises during session today. Introduced balance exercises on the rockerboard which are very difficult for her to perform. She continues to require intermittent rest breaks secondary to fatigue but her endurance is progressively improving. No HEP updates at this time. Plan to continue progressing both balance and strength exercises at future sessions. Pt will benefit from PT services  to address deficits in  strength, balance, and mobility in order to return to full function at home and decrease her risk for falls.  OBJECTIVE IMPAIRMENTS: decreased balance, difficulty walking, and decreased strength.   ACTIVITY LIMITATIONS: squatting, stairs, and transfers  PARTICIPATION LIMITATIONS: cleaning, laundry, shopping, and community activity  PERSONAL FACTORS: Age, Past/current experiences, Time since onset of injury/illness/exacerbation, and 3+ comorbidities: DM, HTN, and colon CA  are also affecting patient's functional outcome.   REHAB POTENTIAL: Good  CLINICAL DECISION MAKING: Unstable/unpredictable  EVALUATION COMPLEXITY: High   GOALS: Goals reviewed with patient? Yes  SHORT TERM GOALS: Target date: 02/17/2023  Pt will be independent with HEP in order to improve strength and balance in order to decrease fall risk and improve function at home. Baseline:  Goal status: PARTIALLY MET   LONG TERM GOALS: Target date: 03/17/2023  Pt will increase FOTO to at least 54 to demonstrate significant improvement in function at home related to balance  Baseline: 01/20/23: 48; 02/19/23: 49; Goal status: PARTIALLY MET  2.  Pt will improve BERG to at least 52/56 in order to demonstrate clinically significant improvement in balance.   Baseline: 01/20/23: To be completed; 01/22/23: 42/56; 02/19/23: 49/56; Goal status: REVISED  3.  Pt will improve ABC to at least 67% in order to demonstrate clinically significant improvement in balance confidence.      Baseline: 01/20/23: To be completed; 01/22/23: 34.4%; 02/19/23: 51.3% Goal status: REVISED  4. Pt will decrease 5TSTS by at least 3 seconds in order to demonstrate clinically significant improvement in LE strength      Baseline: 01/20/23: 17.3s; 02/19/23: 12.4s Goal status: ACHIEVED  5. Pt will decrease TUG to below 14 seconds/decrease in order to demonstrate decreased fall risk.  Baseline: 01/20/23: 17.5s; 02/19/23: 12.9s Goal status: ACHIEVED  PLAN: PT FREQUENCY:  2x/week  PT DURATION: 8 weeks  PLANNED INTERVENTIONS: Therapeutic exercises, Therapeutic activity, Neuromuscular re-education, Balance training, Gait training, Patient/Family education, Self Care, Joint mobilization, Joint manipulation, Vestibular training, Canalith repositioning, Orthotic/Fit training, DME instructions, Dry Needling, Electrical stimulation, Spinal manipulation, Spinal mobilization, Cryotherapy, Moist heat, Taping, Traction, Ultrasound, Ionotophoresis 4mg /ml Dexamethasone, Manual therapy, and Re-evaluation.  PLAN FOR NEXT SESSION: progress balance/strength exercises, review/modify HEP as needed;   Lynnea Maizes PT, DPT, GCS  12:52 PM,03/03/23   [facility] [address] [place], [place], [address] Phone: [phone]   Fax:  [phone]

## 2023-03-03 ENCOUNTER — Ambulatory Visit: Payer: Medicare Other

## 2023-03-03 DIAGNOSIS — R2681 Unsteadiness on feet: Secondary | ICD-10-CM

## 2023-03-05 ENCOUNTER — Ambulatory Visit: Payer: Medicare Other

## 2023-03-05 DIAGNOSIS — R2681 Unsteadiness on feet: Secondary | ICD-10-CM

## 2023-03-05 NOTE — Therapy (Signed)
OUTPATIENT PHYSICAL THERAPY BALANCE TREATMENT  Patient Name: [name] MRN: [id number] DOB:08/08/1946, 77 y.o., female Today's Date: 03/05/2023  END OF SESSION:  PT End of Session - 03/05/23 1016     Visit Number 14    Number of Visits 17    Date for PT Re-Evaluation 03/17/23    Authorization Type eval: 01/20/23    PT Start Time 1015    PT Stop Time 1100    PT Time Calculation (min) 45 min    Equipment Utilized During Treatment Gait belt    Activity Tolerance Patient tolerated treatment well    Behavior During Therapy WFL for tasks assessed/performed            Past Medical History:  Diagnosis Date   Allergy    Arthritis of knee, degenerative 05/08/2015   Basal cell carcinoma 03/2019   nose   Colon cancer 2014   Partial colon resection, chemo + rad tx's.    Complication of anesthesia    Diabetes mellitus without complication    Pt takes Metformin.   Hyperlipidemia    Hypertension    PONV (postoperative nausea and vomiting)    SBO (small bowel obstruction) 04/20/2018   Seizures    Umbilical hernia with obstruction but no gangrene    Vitamin D deficiency    Past Surgical History:  Procedure Laterality Date   CATARACT EXTRACTION, BILATERAL  01/2020   Dr. Zenaida Niece [facility]   COLOSTOMY REVERSAL  01/2014   ILEOSTOMY  05/2013   UMBILICAL HERNIA REPAIR  05/2018   Patient Active Problem List   Diagnosis Date Noted   Stage 3a chronic kidney disease (CKD) 12/30/2022   Trigger finger, left ring finger 12/27/2021   Primary osteoarthritis of both hips 06/10/2019   Bilateral carotid artery stenosis 08/24/2018   Atherosclerosis of aorta 08/24/2018   Type II diabetes mellitus with complication 08/18/2018   Bradycardia 07/24/2018   Neoplasm of uncertain behavior of skin 01/16/2017   Senile ecchymosis 08/20/2015   Essential (primary) hypertension 05/08/2015   History of rectal cancer 05/08/2015   Hyperlipidemia associated with type 2 diabetes mellitus 05/08/2015   Seizure  disorder 05/08/2015   Shoulder strain 05/08/2015   Avitaminosis D 05/08/2015   PCP: Reubin Milan, MD  REFERRING PROVIDER: Reubin Milan, MD   REFERRING DIAG: R26.9 (ICD-10-CM) - Gait disturbance   RATIONALE FOR EVALUATION AND TREATMENT: Rehabilitation  THERAPY DIAG: Unsteadiness on feet  ONSET DATE: 01/17/22 (approximate)  FOLLOW-UP APPT SCHEDULED WITH REFERRING PROVIDER: Yes (in 4 months per patient)  FROM INITIAL EVALUATION (01/20/23) SUBJECTIVE:  SUBJECTIVE STATEMENT:  Unsteadiness  PERTINENT HISTORY:  Pt complains of intermittent difficulty with her balance over the period of multiple months. She fell 6 months ago and fractured multiple ribs. She has noticed when walking across the grass she needs additional support. She uses cart when at the grocery store. She reports some urinary and bowel urge incontinence.    Pain: Yes, knee, back, and shoulder pain; Numbness/Tingling: Yes, occasional numbness in L foot and R hand. 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  Focal Weakness: No, generalized LE weakness Recent changes in overall health/medication: No Prior history of physical therapy for balance:  No Dominant hand: left Red flags: Pt reports some nighttime urinary incontinence and some daily bowel issues, history of colon cancer, occasional chills; Denies h/o spinal tumors, h/o compression fx, abdominal pain, fever, night sweats, nausea, vomiting;  PRECAUTIONS: None  WEIGHT BEARING RESTRICTIONS: No  FALLS: Has patient fallen in last 6 months? Yes. Number of falls 1  (a couple falls before that)  Living Environment Lives with: lives with their family, with daughter Lives in: House/apartment, one level Stairs: Yes: External: 3 steps; on right going up Has following equipment at home: Environmental consultant - 2 wheeled,  Shower bench, Grab bars, and handicap height commode  Prior level of function: Independent with basic ADLs, dtr provides some assist IADLs  Occupational demands: Retired, worked in [employer]  Hobbies: [place], watching television, crafts;  Patient Goals: Pt would like to remain independent as long as possible  OBJECTIVE:  Patient Surveys  FOTO: 48, predicted improvement to 74;  ABC: To be completed;  Cognition Patient is oriented to person, place, and time.  Recent memory and remote memory appear slightly impaired; Attention span and concentration are intact.  Expressive speech is intact.  Patient's fund of knowledge is within normal limits for educational level.    Gross Musculoskeletal Assessment Tremor: None Bulk: Normal Tone: Normal  Posture: Forward head and rounded shoulders  LE MMT: MMT (out of 5) Right  Left   Hip flexion 4+ 4+  Hip extension    Hip abduction    Hip adduction    Hip internal rotation    Hip external rotation    Knee flexion 5 5  Knee extension 5 5  Ankle dorsiflexion 4 4  Ankle plantarflexion    Ankle inversion    Ankle eversion    (* = pain; Blank rows = not tested)  Transfers: Assistive device utilized: None  Sit to stand: Complete Independence Stand to sit: Complete Independence Chair to chair: Complete Independence Floor:  Not tested  Stairs: Level of Assistance: Modified independence Stair Negotiation Technique: Step to Pattern with Bilateral Rails Number of Stairs: 4  Height of Stairs: 6"  Comments: Decreased speed with step-to pattern;  Gait: Gait pattern: step through pattern, decreased step length- Right, and decreased step length- Left Distance walked: 100' Assistive device utilized: None Level of assistance: Complete Independence Comments: Decreased self-selected gait speed  Functional Outcome Measures  01/20/23 01/22/23 Comments  BERG  42/56 Increased risk for fall, in need of intervention  DGI     FGA     TUG 17.5  seconds  Increased risk for fall, in need of intervention  5TSTS 17.3 seconds  Increased risk for fall, in need of intervention  6 Minute Walk Test     10 Meter Gait Speed Self-selected: 17.0s = 0.59 m/s; Fastest: 12.4s = 0.81 m/s  Below threshold for full community mobility  (Blank rows = not tested)  TODAY'S TREATMENT   SUBJECTIVE: Pt reports that she is doing well today. She continues to stay active at home and complete her HEP. No specific questions currently.    PAIN: Chronic L shoulder pain, not related to current episode;   Ther-ex  NuStep L1-4 x 10 minutes for LE strengthening with therapist adjusting resistance and monitoring fatigue; Sit to stand with 6# overhead med ball press x 10; Sit to stand for speed x 30s (10 reps); 6" forward step-ups alternating leading LE x 10 on each side; Double black tband resisted gait forward, backward, R lateral, and L lateral x 5 each direction;    Neuromuscular Re-education  Tandem balance alternating forward LE x 30s each; Tandem gait in // bars without UE support x 4 lengths; Stair ascend/descend without UE support progressing from step-to to reciprocal pattern 4 steps x 4 bouts; Clock stepping/lunges x multiple bouts with each leg;   Not performed: Airex alternating 6" toe taps x 10 BLE; Airex staggered stance balance with front foot on 6" step alternating LE x 30s each; Airex feet together (FT) eyes open/closed x 30s each; Airex FT horizontal and vertical head turns x 30s each; Airex balance beam side stepping x multiple lengths; Ladder side stepping 12' x multiple lengths; Standing hip strengthening with 5# ankle weights (AW) in // bars: Hip flexion marches x 15 BLE; HS curls x 15 BLE; Hip abduction x 15 BLE; Hip extension x 15 BLE; Standing heel raises x 15; Side stepping with 5# AW x 6 lengths; Seated LAQ with 5# AW 2 x 15 BLE; Seated clams with manual resistance from therapist x 10 BLE; Seated adductor squeezes  with manual resistance from therapist x 10 BLE; Rockerboard balance in A/P and R/L orientation x mutiple trials in each orientation; Rockerboard anterior/posterior weight shifts in A/P orientation x 10 each direction; Forward 6" hurdle stepping obstacle course x multiple lengths; Lateral 6" hurdle stepping obstacle course x multiple lengths;     PATIENT EDUCATION:  Education details: Pt educated throughout session about proper posture and technique with exercises. Improved exercise technique, movement at target joints, use of target muscles after min to mod verbal, visual, tactile cues. Balance exercises Person educated: Patient Education method: Explanation, Demonstration, Verbal cues, and Handouts Education comprehension: verbalized understanding, returned demonstration, and verbal cues required   HOME EXERCISE PROGRAM:  Access Code: [email or web address] URL: [email or web address] Date: 01/22/2023 Prepared by: Ria Comment  Exercises - Sit to Stand Without Arm Support  - 1 x daily - 7 x weekly - 2 sets - 10 reps - Standing Hip Abduction with Counter Support  - 1 x daily - 7 x weekly - 2 sets - 10 reps - 3s hold - Heel Raises with Counter Support  - 1 x daily - 7 x weekly - 2 sets - 10 reps - 3s hold - Standing March with Counter Support  - 1 x daily - 7 x weekly - 2 sets - 10 reps - 3s hold - Corner Balance Feet Together: Eyes Open With Head Turns  - 1 x daily - 7 x weekly - 3 reps - 30s hold   ASSESSMENT:  CLINICAL IMPRESSION: Patient demonstrates excellent motivation during session today. Progressed strengthening and balance exercises during session. Repeated clock stepping/lunging which was challenging for patient in the past but she performed with better stability today. Performed sit to stands for time today to work on LE power as well as endurance. No HEP updates at this time. Plan to continue progressing  both balance and strength exercises at future sessions. Pt will  benefit from PT services to address deficits in strength, balance, and mobility in order to return to full function at home and decrease her risk for falls.  OBJECTIVE IMPAIRMENTS: decreased balance, difficulty walking, and decreased strength.   ACTIVITY LIMITATIONS: squatting, stairs, and transfers  PARTICIPATION LIMITATIONS: cleaning, laundry, shopping, and community activity  PERSONAL FACTORS: Age, Past/current experiences, Time since onset of injury/illness/exacerbation, and 3+ comorbidities: DM, HTN, and colon CA  are also affecting patient's functional outcome.   REHAB POTENTIAL: Good  CLINICAL DECISION MAKING: Unstable/unpredictable  EVALUATION COMPLEXITY: High   GOALS: Goals reviewed with patient? Yes  SHORT TERM GOALS: Target date: 02/17/2023  Pt will be independent with HEP in order to improve strength and balance in order to decrease fall risk and improve function at home. Baseline:  Goal status: PARTIALLY MET   LONG TERM GOALS: Target date: 03/17/2023  Pt will increase FOTO to at least 54 to demonstrate significant improvement in function at home related to balance  Baseline: 01/20/23: 48; 02/19/23: 49; Goal status: PARTIALLY MET  2.  Pt will improve BERG to at least 52/56 in order to demonstrate clinically significant improvement in balance.   Baseline: 01/20/23: To be completed; 01/22/23: 42/56; 02/19/23: 49/56; Goal status: REVISED  3.  Pt will improve ABC to at least 67% in order to demonstrate clinically significant improvement in balance confidence.      Baseline: 01/20/23: To be completed; 01/22/23: 34.4%; 02/19/23: 51.3% Goal status: REVISED  4. Pt will decrease 5TSTS by at least 3 seconds in order to demonstrate clinically significant improvement in LE strength      Baseline: 01/20/23: 17.3s; 02/19/23: 12.4s Goal status: ACHIEVED  5. Pt will decrease TUG to below 14 seconds/decrease in order to demonstrate decreased fall risk.  Baseline: 01/20/23: 17.5s; 02/19/23: 12.9s Goal  status: ACHIEVED  PLAN: PT FREQUENCY: 2x/week  PT DURATION: 8 weeks  PLANNED INTERVENTIONS: Therapeutic exercises, Therapeutic activity, Neuromuscular re-education, Balance training, Gait training, Patient/Family education, Self Care, Joint mobilization, Joint manipulation, Vestibular training, Canalith repositioning, Orthotic/Fit training, DME instructions, Dry Needling, Electrical stimulation, Spinal manipulation, Spinal mobilization, Cryotherapy, Moist heat, Taping, Traction, Ultrasound, Ionotophoresis /ml Dexamethasone, Manual therapy, and Re-evaluation.  PLAN FOR NEXT SESSION: progress balance/strength exercises, review/modify HEP as needed;   Lynnea Maizes PT, DPT, GCS  11:43 AM,03/05/23   [facility] Physical Therapy [address] [place], [place], [address] Phone: [phone]   Fax:  [phone]

## 2023-03-06 DIAGNOSIS — B351 Tinea unguium: Secondary | ICD-10-CM | POA: Diagnosis not present

## 2023-03-06 DIAGNOSIS — E119 Type 2 diabetes mellitus without complications: Secondary | ICD-10-CM | POA: Diagnosis not present

## 2023-03-10 ENCOUNTER — Ambulatory Visit: Payer: Medicare Other

## 2023-03-10 DIAGNOSIS — R2681 Unsteadiness on feet: Secondary | ICD-10-CM | POA: Diagnosis not present

## 2023-03-10 NOTE — Therapy (Signed)
OUTPATIENT PHYSICAL THERAPY BALANCE TREATMENT  Patient Name: [name] MRN: [id number] DOB:03/27/1946, 77 y.o., female Today's Date: 03/11/2023  END OF SESSION:  PT End of Session - 03/10/23 1456     Visit Number 15    Number of Visits 17    Date for PT Re-Evaluation 03/17/23    Authorization Type eval: 01/20/23    PT Start Time 1447    PT Stop Time 1530    PT Time Calculation (min) 43 min    Equipment Utilized During Treatment Gait belt    Activity Tolerance Patient tolerated treatment well    Behavior During Therapy WFL for tasks assessed/performed            Past Medical History:  Diagnosis Date   Allergy    Arthritis of knee, degenerative 05/08/2015   Basal cell carcinoma 03/2019   nose   Colon cancer 2014   Partial colon resection, chemo + rad tx's.    Complication of anesthesia    Diabetes mellitus without complication    Pt takes Metformin.   Hyperlipidemia    Hypertension    PONV (postoperative nausea and vomiting)    SBO (small bowel obstruction) 04/20/2018   Seizures    Umbilical hernia with obstruction but no gangrene    Vitamin D deficiency    Past Surgical History:  Procedure Laterality Date   CATARACT EXTRACTION, BILATERAL  01/2020   Dr. Zenaida Niece [facility]   COLOSTOMY REVERSAL  01/2014   ILEOSTOMY  05/2013   UMBILICAL HERNIA REPAIR  05/2018   Patient Active Problem List   Diagnosis Date Noted   Stage 3a chronic kidney disease (CKD) 12/30/2022   Trigger finger, left ring finger 12/27/2021   Primary osteoarthritis of both hips 06/10/2019   Bilateral carotid artery stenosis 08/24/2018   Atherosclerosis of aorta 08/24/2018   Type II diabetes mellitus with complication 08/18/2018   Bradycardia 07/24/2018   Neoplasm of uncertain behavior of skin 01/16/2017   Senile ecchymosis 08/20/2015   Essential (primary) hypertension 05/08/2015   History of rectal cancer 05/08/2015   Hyperlipidemia associated with type 2 diabetes mellitus 05/08/2015   Seizure  disorder 05/08/2015   Shoulder strain 05/08/2015   Avitaminosis D 05/08/2015   PCP: Reubin Milan, MD  REFERRING PROVIDER: Reubin Milan, MD   REFERRING DIAG: R26.9 (ICD-10-CM) - Gait disturbance   RATIONALE FOR EVALUATION AND TREATMENT: Rehabilitation  THERAPY DIAG: Unsteadiness on feet  ONSET DATE: 01/17/22 (approximate)  FOLLOW-UP APPT SCHEDULED WITH REFERRING PROVIDER: Yes (in 4 months per patient)  FROM INITIAL EVALUATION (01/20/23) SUBJECTIVE:  SUBJECTIVE STATEMENT:  Unsteadiness  PERTINENT HISTORY:  Pt complains of intermittent difficulty with her balance over the period of multiple months. She fell 6 months ago and fractured multiple ribs. She has noticed when walking across the grass she needs additional support. She uses cart when at the grocery store. She reports some urinary and bowel urge incontinence.    Pain: Yes, knee, back, and shoulder pain; Numbness/Tingling: Yes, occasional numbness in L foot and R hand. 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  Focal Weakness: No, generalized LE weakness Recent changes in overall health/medication: No Prior history of physical therapy for balance:  No Dominant hand: left Red flags: Pt reports some nighttime urinary incontinence and some daily bowel issues, history of colon cancer, occasional chills; Denies h/o spinal tumors, h/o compression fx, abdominal pain, fever, night sweats, nausea, vomiting;  PRECAUTIONS: None  WEIGHT BEARING RESTRICTIONS: No  FALLS: Has patient fallen in last 6 months? Yes. Number of falls 1  (a couple falls before that)  Living Environment Lives with: lives with their family, with daughter Lives in: House/apartment, one level Stairs: Yes: External: 3 steps; on right going up Has following equipment at home: Environmental consultant - 2 wheeled,  Shower bench, Grab bars, and handicap height commode  Prior level of function: Independent with basic ADLs, dtr provides some assist IADLs  Occupational demands: Retired, worked in [employer]  Hobbies: Walks, watching television, crafts;  Patient Goals: Pt would like to remain independent as long as possible  OBJECTIVE:  Patient Surveys  FOTO: 48, predicted improvement to 43;  ABC: To be completed;  Cognition Patient is oriented to person, place, and time.  Recent memory and remote memory appear slightly impaired; Attention span and concentration are intact.  Expressive speech is intact.  Patient's fund of knowledge is within normal limits for educational level.    Gross Musculoskeletal Assessment Tremor: None Bulk: Normal Tone: Normal  Posture: Forward head and rounded shoulders  LE MMT: MMT (out of 5) Right  Left   Hip flexion 4+ 4+  Hip extension    Hip abduction    Hip adduction    Hip internal rotation    Hip external rotation    Knee flexion 5 5  Knee extension 5 5  Ankle dorsiflexion 4 4  Ankle plantarflexion    Ankle inversion    Ankle eversion    (* = pain; Blank rows = not tested)  Transfers: Assistive device utilized: None  Sit to stand: Complete Independence Stand to sit: Complete Independence Chair to chair: Complete Independence Floor:  Not tested  Stairs: Level of Assistance: Modified independence Stair Negotiation Technique: Step to Pattern with Bilateral Rails Number of Stairs: 4  Height of Stairs: 6"  Comments: Decreased speed with step-to pattern;  Gait: Gait pattern: step through pattern, decreased step length- Right, and decreased step length- Left Distance walked: 100' Assistive device utilized: None Level of assistance: Complete Independence Comments: Decreased self-selected gait speed  Functional Outcome Measures  01/20/23 01/22/23 Comments  BERG  42/56 Increased risk for fall, in need of intervention  DGI     FGA     TUG 17.5  seconds  Increased risk for fall, in need of intervention  5TSTS 17.3 seconds  Increased risk for fall, in need of intervention  6 Minute Walk Test     10 Meter Gait Speed Self-selected: 17.0s = 0.59 m/s; Fastest: 12.4s = 0.81 m/s  Below threshold for full community mobility  (Blank rows = not tested)  TODAY'S TREATMENT   SUBJECTIVE: Pt reports that she is doing well today. She continues to stay active at home and complete her HEP. No specific questions currently.    PAIN: Chronic L shoulder pain, not related to current episode;   Ther-ex  NuStep L1-5 x 10 minutes for LE strengthening with therapist adjusting resistance and monitoring fatigue; Standing hip strengthening with 7.5# ankle weights (AW) in // bars: Hip flexion marches x 10 BLE; HS curls x 10 BLE; Hip abduction x 10 BLE; Hip extension x 10 BLE;  Standing heel raises x 15; Side stepping with 7.5# AW x 6 lengths; Seated LAQ with 7.5# AW 2 x 10 BLE;    Neuromuscular Re-education  Forward/backward gait outside in grass x 70's each; Forward gait outside in grass with vertical ball lifts with head/eye follow 2 x 70'; Forward gait outside in grass with horizontal ball pass between hands with head/eye follow 2 x 70'; Forward gait outside in grass with horizontal ball pass to therapist with head/eye follow x 70' toward each side; Stair ascend/descend without UE support with reciprocal pattern 4 steps x 4 bouts;   Not performed: Airex alternating 6" toe taps x 10 BLE; Airex staggered stance balance with front foot on 6" step alternating LE x 30s each; Airex feet together (FT) eyes open/closed x 30s each; Airex FT horizontal and vertical head turns x 30s each; Airex balance beam side stepping x multiple lengths; Ladder side stepping 12' x multiple lengths; Seated clams with manual resistance from therapist x 10 BLE; Seated adductor squeezes with manual resistance from therapist x 10 BLE; Rockerboard balance in A/P  and R/L orientation x mutiple trials in each orientation; Rockerboard anterior/posterior weight shifts in A/P orientation x 10 each direction; Forward 6" hurdle stepping obstacle course x multiple lengths; Lateral 6" hurdle stepping obstacle course x multiple lengths; Tandem balance alternating forward LE x 30s each; Tandem gait in // bars without UE support x 4 lengths; Clock stepping/lunges x multiple bouts with each leg; Sit to stand with 6# overhead med ball press x 10; Sit to stand for speed x 30s (10 reps); 6" forward step-ups alternating leading LE x 10 on each side; Double black tband resisted gait forward, backward, R lateral, and L lateral x 5 each direction;    PATIENT EDUCATION:  Education details: Pt educated throughout session about proper posture and technique with exercises. Improved exercise technique, movement at target joints, use of target muscles after min to mod verbal, visual, tactile cues. Balance exercises Person educated: Patient Education method: Explanation, Demonstration, Verbal cues, and Handouts Education comprehension: verbalized understanding, returned demonstration, and verbal cues required   HOME EXERCISE PROGRAM:  Access Code: [email or web address] URL: [email or web address] Date: 01/22/2023 Prepared by: Ria Comment  Exercises - Sit to Stand Without Arm Support  - 1 x daily - 7 x weekly - 2 sets - 10 reps - Standing Hip Abduction with Counter Support  - 1 x daily - 7 x weekly - 2 sets - 10 reps - 3s hold - Heel Raises with Counter Support  - 1 x daily - 7 x weekly - 2 sets - 10 reps - 3s hold - Standing March with Counter Support  - 1 x daily - 7 x weekly - 2 sets - 10 reps - 3s hold - Corner Balance Feet Together: Eyes Open With Head Turns  - 1 x daily - 7 x weekly - 3 reps - 30s hold   ASSESSMENT:  CLINICAL IMPRESSION: Patient demonstrates  excellent motivation during session today. Performed balance training outside on uneven grass which  is challenging for patient. Incorporated dual motor tasks and head turns. Also progressed balance exercises during session and increased ankle weights to 7.5#. No HEP updates at this time. Plan to continue progressing both balance and strength exercises at future sessions. Pt will benefit from PT services to address deficits in strength, balance, and mobility in order to return to full function at home and decrease her risk for falls.  OBJECTIVE IMPAIRMENTS: decreased balance, difficulty walking, and decreased strength.   ACTIVITY LIMITATIONS: squatting, stairs, and transfers  PARTICIPATION LIMITATIONS: cleaning, laundry, shopping, and community activity  PERSONAL FACTORS: Age, Past/current experiences, Time since onset of injury/illness/exacerbation, and 3+ comorbidities: DM, HTN, and colon CA  are also affecting patient's functional outcome.   REHAB POTENTIAL: Good  CLINICAL DECISION MAKING: Unstable/unpredictable  EVALUATION COMPLEXITY: High   GOALS: Goals reviewed with patient? Yes  SHORT TERM GOALS: Target date: 02/17/2023  Pt will be independent with HEP in order to improve strength and balance in order to decrease fall risk and improve function at home. Baseline:  Goal status: PARTIALLY MET   LONG TERM GOALS: Target date: 03/17/2023  Pt will increase FOTO to at least 54 to demonstrate significant improvement in function at home related to balance  Baseline: 01/20/23: 48; 02/19/23: 49; Goal status: PARTIALLY MET  2.  Pt will improve BERG to at least 52/56 in order to demonstrate clinically significant improvement in balance.   Baseline: 01/20/23: To be completed; 01/22/23: 42/56; 02/19/23: 49/56; Goal status: REVISED  3.  Pt will improve ABC to at least 67% in order to demonstrate clinically significant improvement in balance confidence.      Baseline: 01/20/23: To be completed; 01/22/23: 34.4%; 02/19/23: 51.3% Goal status: REVISED  4. Pt will decrease 5TSTS by at least 3 seconds in order  to demonstrate clinically significant improvement in LE strength      Baseline: 01/20/23: 17.3s; 02/19/23: 12.4s Goal status: ACHIEVED  5. Pt will decrease TUG to below 14 seconds/decrease in order to demonstrate decreased fall risk.  Baseline: 01/20/23: 17.5s; 02/19/23: 12.9s Goal status: ACHIEVED  PLAN: PT FREQUENCY: 2x/week  PT DURATION: 8 weeks  PLANNED INTERVENTIONS: Therapeutic exercises, Therapeutic activity, Neuromuscular re-education, Balance training, Gait training, Patient/Family education, Self Care, Joint mobilization, Joint manipulation, Vestibular training, Canalith repositioning, Orthotic/Fit training, DME instructions, Dry Needling, Electrical stimulation, Spinal manipulation, Spinal mobilization, Cryotherapy, Moist heat, Taping, Traction, Ultrasound, Ionotophoresis 4mg /ml Dexamethasone, Manual therapy, and Re-evaluation.  PLAN FOR NEXT SESSION: progress balance/strength exercises, review/modify HEP as needed;   Lynnea Maizes PT, DPT, GCS  8:35 PM,03/11/23   [facility] [address] [place], [place], [address] Phone: [phone]   Fax:  [phone]

## 2023-03-12 ENCOUNTER — Ambulatory Visit: Payer: Medicare Other

## 2023-03-12 DIAGNOSIS — R2681 Unsteadiness on feet: Secondary | ICD-10-CM

## 2023-03-12 NOTE — Therapy (Signed)
OUTPATIENT PHYSICAL THERAPY BALANCE TREATMENT  Patient Name: [name] MRN: [id number] DOB:25-May-1946, 77 y.o., female Today's Date: 03/12/2023  END OF SESSION:   Past Medical History:  Diagnosis Date   Allergy    Arthritis of knee, degenerative 05/08/2015   Basal cell carcinoma 03/2019   nose   Colon cancer 2014   Partial colon resection, chemo + rad tx's.    Complication of anesthesia    Diabetes mellitus without complication    Pt takes Metformin.   Hyperlipidemia    Hypertension    PONV (postoperative nausea and vomiting)    SBO (small bowel obstruction) 04/20/2018   Seizures    Umbilical hernia with obstruction but no gangrene    Vitamin D deficiency    Past Surgical History:  Procedure Laterality Date   CATARACT EXTRACTION, BILATERAL  01/2020   Dr. Zenaida Niece [facility]   COLOSTOMY REVERSAL  01/2014   ILEOSTOMY  05/2013   UMBILICAL HERNIA REPAIR  05/2018   Patient Active Problem List   Diagnosis Date Noted   Stage 3a chronic kidney disease (CKD) 12/30/2022   Trigger finger, left ring finger 12/27/2021   Primary osteoarthritis of both hips 06/10/2019   Bilateral carotid artery stenosis 08/24/2018   Atherosclerosis of aorta 08/24/2018   Type II diabetes mellitus with complication 08/18/2018   Bradycardia 07/24/2018   Neoplasm of uncertain behavior of skin 01/16/2017   Senile ecchymosis 08/20/2015   Essential (primary) hypertension 05/08/2015   History of rectal cancer 05/08/2015   Hyperlipidemia associated with type 2 diabetes mellitus 05/08/2015   Seizure disorder 05/08/2015   Shoulder strain 05/08/2015   Avitaminosis D 05/08/2015   PCP: Reubin Milan, MD  REFERRING PROVIDER: Reubin Milan, MD   REFERRING DIAG: R26.9 (ICD-10-CM) - Gait disturbance   RATIONALE FOR EVALUATION AND TREATMENT: Rehabilitation  THERAPY DIAG: No diagnosis found.  ONSET DATE: 01/17/22 (approximate)  FOLLOW-UP APPT SCHEDULED WITH REFERRING PROVIDER: Yes (in 4 months per  patient)  FROM INITIAL EVALUATION (01/20/23) SUBJECTIVE:                                                                                                                                                                                         SUBJECTIVE STATEMENT:  Unsteadiness  PERTINENT HISTORY:  Pt complains of intermittent difficulty with her balance over the period of multiple months. She fell 6 months ago and fractured multiple ribs. She has noticed when walking across the grass she needs additional support. She uses cart when at the grocery store. She reports some urinary and bowel urge incontinence.    Pain: Yes, knee, back, and shoulder pain; Numbness/Tingling: Yes, occasional numbness  in L foot and R hand. 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  Focal Weakness: No, generalized LE weakness Recent changes in overall health/medication: No Prior history of physical therapy for balance:  No Dominant hand: left Red flags: Pt reports some nighttime urinary incontinence and some daily bowel issues, history of colon cancer, occasional chills; Denies h/o spinal tumors, h/o compression fx, abdominal pain, fever, night sweats, nausea, vomiting;  PRECAUTIONS: None  WEIGHT BEARING RESTRICTIONS: No  FALLS: Has patient fallen in last 6 months? Yes. Number of falls 1  (a couple falls before that)  Living Environment Lives with: lives with their family, with daughter Lives in: House/apartment, one level Stairs: Yes: External: 3 steps; on right going up Has following equipment at home: Environmental consultant - 2 wheeled, Shower bench, Grab bars, and handicap height commode  Prior level of function: Independent with basic ADLs, dtr provides some assist IADLs  Occupational demands: Retired, worked in [employer]  Hobbies: Walks, watching television, crafts;  Patient Goals: Pt would like to remain independent as long as possible  OBJECTIVE:  Patient Surveys  FOTO: 48, predicted improvement to 61;  ABC: To be  completed;  Cognition Patient is oriented to person, place, and time.  Recent memory and remote memory appear slightly impaired; Attention span and concentration are intact.  Expressive speech is intact.  Patient's fund of knowledge is within normal limits for educational level.    Gross Musculoskeletal Assessment Tremor: None Bulk: Normal Tone: Normal  Posture: Forward head and rounded shoulders  LE MMT: MMT (out of 5) Right  Left   Hip flexion 4+ 4+  Hip extension    Hip abduction    Hip adduction    Hip internal rotation    Hip external rotation    Knee flexion 5 5  Knee extension 5 5  Ankle dorsiflexion 4 4  Ankle plantarflexion    Ankle inversion    Ankle eversion    (* = pain; Blank rows = not tested)  Transfers: Assistive device utilized: None  Sit to stand: Complete Independence Stand to sit: Complete Independence Chair to chair: Complete Independence Floor:  Not tested  Stairs: Level of Assistance: Modified independence Stair Negotiation Technique: Step to Pattern with Bilateral Rails Number of Stairs: 4  Height of Stairs: 6"  Comments: Decreased speed with step-to pattern;  Gait: Gait pattern: step through pattern, decreased step length- Right, and decreased step length- Left Distance walked: 100' Assistive device utilized: None Level of assistance: Complete Independence Comments: Decreased self-selected gait speed  Functional Outcome Measures  01/20/23 01/22/23 Comments  BERG  42/56 Increased risk for fall, in need of intervention  DGI     FGA     TUG 17.5 seconds  Increased risk for fall, in need of intervention  5TSTS 17.3 seconds  Increased risk for fall, in need of intervention  6 Minute Walk Test     10 Meter Gait Speed Self-selected: 17.0s = 0.59 m/s; Fastest: 12.4s = 0.81 m/s  Below threshold for full community mobility  (Blank rows = not tested)    TODAY'S TREATMENT   SUBJECTIVE: Pt reports that she is doing well today. She  continues to stay active at home and complete her HEP. No specific questions currently.    PAIN: Chronic L shoulder pain, not related to current episode;   Ther-ex 32 NuStep L1-4 x 10 minutes for LE strengthening with therapist adjusting resistance and monitoring fatigue; Standing hip strengthening with 7.5# ankle weights (AW) in // bars: Forward step  up #5 ankle wts 2 x 60secs Side ways step ups #5 ankle wts 2 x 60 secs Hip flexion marches x 10 BLE; HS curls x 10 BLE; Hip abduction x 10 BLE; Hip extension x 2 x 60 secs. Standing heel raises x 60secs; Side stepping with 7.5# AW x 6 lengths; Seated LAQ with 7.5# AW 2 x 10 BLE; Forward and side step ups with #5 AW 2 x 60 secs each   Neuromuscular Re-education: 10 Forward and backward #5AW stepping in //bars x 60 secs Side stepping #5 AW x 60 secs Tapping balloon x 120 secs Standing on FOAM with EO/EC 30 secs each    Not performed: Forward/backward gait outside in grass x 70's each; Forward gait outside in grass with vertical ball lifts with head/eye follow 2 x 70'; Forward gait outside in grass with horizontal ball pass between hands with head/eye follow 2 x 70'; Forward gait outside in grass with horizontal ball pass to therapist with head/eye follow x 70' toward each side; Stair ascend/descend without UE support with reciprocal pattern 4 steps x 4 bouts; Airex alternating 6" toe taps x 10 BLE; Airex staggered stance balance with front foot on 6" step alternating LE x 30s each; Airex feet together (FT) eyes open/closed x 30s each; Airex FT horizontal and vertical head turns x 30s each; Airex balance beam side stepping x multiple lengths; Ladder side stepping 12' x multiple lengths; Seated clams with manual resistance from therapist x 10 BLE; Seated adductor squeezes with manual resistance from therapist x 10 BLE; Rockerboard balance in A/P and R/L orientation x mutiple trials in each orientation; Rockerboard  anterior/posterior weight shifts in A/P orientation x 10 each direction; Forward 6" hurdle stepping obstacle course x multiple lengths; Lateral 6" hurdle stepping obstacle course x multiple lengths; Tandem balance alternating forward LE x 30s each; Tandem gait in // bars without UE support x 4 lengths; Clock stepping/lunges x multiple bouts with each leg; Sit to stand with 6# overhead med ball press x 10; Sit to stand for speed x 30s (10 reps); 6" forward step-ups alternating leading LE x 10 on each side; Double black tband resisted gait forward, backward, R lateral, and L lateral x 5 each direction;    PATIENT EDUCATION:  Education details: Pt educated throughout session about proper posture and technique with exercises. Improved exercise technique, movement at target joints, use of target muscles after min to mod verbal, visual, tactile cues. Balance exercises Person educated: Patient Education method: Explanation, Demonstration, Verbal cues, and Handouts Education comprehension: verbalized understanding, returned demonstration, and verbal cues required   HOME EXERCISE PROGRAM:  Access Code: [email or web address] URL: [email or web address] Date: 01/22/2023 Prepared by: Ria Comment  Exercises - Sit to Stand Without Arm Support  - 1 x daily - 7 x weekly - 2 sets - 10 reps - Standing Hip Abduction with Counter Support  - 1 x daily - 7 x weekly - 2 sets - 10 reps - 3s hold - Heel Raises with Counter Support  - 1 x daily - 7 x weekly - 2 sets - 10 reps - 3s hold - Standing March with Counter Support  - 1 x daily - 7 x weekly - 2 sets - 10 reps - 3s hold - Corner Balance Feet Together: Eyes Open With Head Turns  - 1 x daily - 7 x weekly - 3 reps - 30s hold   ASSESSMENT:  CLINICAL IMPRESSION: Patient demonstrates excellent motivation during session today. Performed balance training which  is challenging for patient. Incorporated dual motor tasks and head turns. Also progressed  balance exercises during session and increased ankle weights to 7.5#. PT continued progress in with dynamic balance indoors where pt needed to correct her balance. Pt uses Vision to compensate.  Pt will benefit from PT services to address deficits in strength, balance, and mobility in order to return to full function at home and decrease her risk for falls.  OBJECTIVE IMPAIRMENTS: decreased balance, difficulty walking, and decreased strength.   ACTIVITY LIMITATIONS: squatting, stairs, and transfers  PARTICIPATION LIMITATIONS: cleaning, laundry, shopping, and community activity  PERSONAL FACTORS: Age, Past/current experiences, Time since onset of injury/illness/exacerbation, and 3+ comorbidities: DM, HTN, and colon CA  are also affecting patient's functional outcome.   REHAB POTENTIAL: Good  CLINICAL DECISION MAKING: Unstable/unpredictable  EVALUATION COMPLEXITY: High   GOALS: Goals reviewed with patient? Yes  SHORT TERM GOALS: Target date: 02/17/2023  Pt will be independent with HEP in order to improve strength and balance in order to decrease fall risk and improve function at home. Baseline:  Goal status: PARTIALLY MET   LONG TERM GOALS: Target date: 03/17/2023  Pt will increase FOTO to at least 54 to demonstrate significant improvement in function at home related to balance  Baseline: 01/20/23: 48; 02/19/23: 49; Goal status: PARTIALLY MET  2.  Pt will improve BERG to at least 52/56 in order to demonstrate clinically significant improvement in balance.   Baseline: 01/20/23: To be completed; 01/22/23: 42/56; 02/19/23: 49/56; Goal status: REVISED  3.  Pt will improve ABC to at least 67% in order to demonstrate clinically significant improvement in balance confidence.      Baseline: 01/20/23: To be completed; 01/22/23: 34.4%; 02/19/23: 51.3% Goal status: REVISED  4. Pt will decrease 5TSTS by at least 3 seconds in order to demonstrate clinically significant improvement in LE strength       Baseline: 01/20/23: 17.3s; 02/19/23: 12.4s Goal status: ACHIEVED  5. Pt will decrease TUG to below 14 seconds/decrease in order to demonstrate decreased fall risk.  Baseline: 01/20/23: 17.5s; 02/19/23: 12.9s Goal status: ACHIEVED  PLAN: PT FREQUENCY: 2x/week  PT DURATION: 8 weeks  PLANNED INTERVENTIONS: Therapeutic exercises, Therapeutic activity, Neuromuscular re-education, Balance training, Gait training, Patient/Family education, Self Care, Joint mobilization, Joint manipulation, Vestibular training, Canalith repositioning, Orthotic/Fit training, DME instructions, Dry Needling, Electrical stimulation, Spinal manipulation, Spinal mobilization, Cryotherapy, Moist heat, Taping, Traction, Ultrasound, Ionotophoresis 4mg /ml Dexamethasone, Manual therapy, and Re-evaluation.  PLAN FOR NEXT SESSION: progress balance/strength exercises, review/modify HEP as needed;   Janet Berlin PT DPT 4:25 PM,03/12/23 [facility] [address] [place], [place], [address] Phone: [phone]   Fax:  [phone]

## 2023-03-17 ENCOUNTER — Ambulatory Visit: Payer: Medicare Other

## 2023-03-17 DIAGNOSIS — R2681 Unsteadiness on feet: Secondary | ICD-10-CM

## 2023-03-17 NOTE — Therapy (Signed)
OUTPATIENT PHYSICAL THERAPY BALANCE TREATMENT/RECERTIFICATION  Patient Name: [name] MRN: [id number] DOB:[birth year], 77 y.o., female Today's Date: 03/17/2023  END OF SESSION:  PT End of Session - 03/17/23 1544     Visit Number 17    Number of Visits 33    Date for PT Re-Evaluation 05/12/23    Authorization Type eval: 01/20/23    PT Start Time 1537    PT Stop Time 1615    PT Time Calculation (min) 38 min    Equipment Utilized During Treatment Gait belt    Activity Tolerance Patient tolerated treatment well    Behavior During Therapy WFL for tasks assessed/performed            Past Medical History:  Diagnosis Date   Allergy    Arthritis of knee, degenerative 05/08/2015   Basal cell carcinoma 03/2019   nose   Colon cancer (HCC) 2014   Partial colon resection, chemo + rad tx's.    Complication of anesthesia    Diabetes mellitus without complication (HCC)    Pt takes Metformin.   Hyperlipidemia    Hypertension    PONV (postoperative nausea and vomiting)    SBO (small bowel obstruction) (HCC) 04/20/2018   Seizures (HCC)    Umbilical hernia with obstruction but no gangrene    Vitamin D deficiency    Past Surgical History:  Procedure Laterality Date   CATARACT EXTRACTION, BILATERAL  01/2020   Dr. Zenaida Niece [facility]   COLOSTOMY REVERSAL  01/2014   ILEOSTOMY  05/2013   UMBILICAL HERNIA REPAIR  05/2018   Patient Active Problem List   Diagnosis Date Noted   Stage 3a chronic kidney disease (CKD) (HCC) 12/30/2022   Trigger finger, left ring finger 12/27/2021   Primary osteoarthritis of both hips 06/10/2019   Bilateral carotid artery stenosis 08/24/2018   Atherosclerosis of aorta (HCC) 08/24/2018   Type II diabetes mellitus with complication (HCC) 08/18/2018   Bradycardia 07/24/2018   Neoplasm of uncertain behavior of skin 01/16/2017   Senile ecchymosis 08/20/2015   Essential (primary) hypertension 05/08/2015   History of rectal cancer 05/08/2015   Hyperlipidemia  associated with type 2 diabetes mellitus (HCC) 05/08/2015   Seizure disorder (HCC) 05/08/2015   Shoulder strain 05/08/2015   Avitaminosis D 05/08/2015   PCP: Reubin Milan, MD  REFERRING PROVIDER: Reubin Milan, MD   REFERRING DIAG: R26.9 (ICD-10-CM) - Gait disturbance   RATIONALE FOR EVALUATION AND TREATMENT: Rehabilitation  THERAPY DIAG: Unsteadiness on feet  ONSET DATE: 01/17/22 (approximate)  FOLLOW-UP APPT SCHEDULED WITH REFERRING PROVIDER: Yes (in 4 months per patient)  FROM INITIAL EVALUATION (01/20/23) SUBJECTIVE:  SUBJECTIVE STATEMENT:  Unsteadiness  PERTINENT HISTORY:  Pt complains of intermittent difficulty with her balance over the period of multiple months. She fell 6 months ago and fractured multiple ribs. She has noticed when walking across the grass she needs additional support. She uses cart when at the grocery store. She reports some urinary and bowel urge incontinence.    Pain: Yes, knee, back, and shoulder pain; Numbness/Tingling: Yes, occasional numbness in L foot and R hand. 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  Focal Weakness: No, generalized LE weakness Recent changes in overall health/medication: No Prior history of physical therapy for balance:  No Dominant hand: left Red flags: Pt reports some nighttime urinary incontinence and some daily bowel issues, history of colon cancer, occasional chills; Denies h/o spinal tumors, h/o compression fx, abdominal pain, fever, night sweats, nausea, vomiting;  PRECAUTIONS: None  WEIGHT BEARING RESTRICTIONS: No  FALLS: Has patient fallen in last 6 months? Yes. Number of falls 1  (a couple falls before that)  Living Environment Lives with: lives with their family, with daughter Lives in: House/apartment, one level Stairs: Yes: External: 3 steps; on  right going up Has following equipment at home: Environmental consultant - 2 wheeled, Shower bench, Grab bars, and handicap height commode  Prior level of function: Independent with basic ADLs, dtr provides some assist IADLs  Occupational demands: Retired, worked in [employer]  Hobbies: Walks, watching television, crafts;  Patient Goals: Pt would like to remain independent as long as possible  OBJECTIVE:  Patient Surveys  FOTO: 48, predicted improvement to 71;  ABC: To be completed;  Cognition Patient is oriented to person, place, and time.  Recent memory and remote memory appear slightly impaired; Attention span and concentration are intact.  Expressive speech is intact.  Patient's fund of knowledge is within normal limits for educational level.    Gross Musculoskeletal Assessment Tremor: None Bulk: Normal Tone: Normal  Posture: Forward head and rounded shoulders  LE MMT: MMT (out of 5) Right  Left   Hip flexion 4+ 4+  Hip extension    Hip abduction    Hip adduction    Hip internal rotation    Hip external rotation    Knee flexion 5 5  Knee extension 5 5  Ankle dorsiflexion 4 4  Ankle plantarflexion    Ankle inversion    Ankle eversion    (* = pain; Blank rows = not tested)  Transfers: Assistive device utilized: None  Sit to stand: Complete Independence Stand to sit: Complete Independence Chair to chair: Complete Independence Floor:  Not tested  Stairs: Level of Assistance: Modified independence Stair Negotiation Technique: Step to Pattern with Bilateral Rails Number of Stairs: 4  Height of Stairs: 6"  Comments: Decreased speed with step-to pattern;  Gait: Gait pattern: step through pattern, decreased step length- Right, and decreased step length- Left Distance walked: 100' Assistive device utilized: None Level of assistance: Complete Independence Comments: Decreased self-selected gait speed  Functional Outcome Measures  01/20/23 01/22/23 Comments  BERG  42/56  Increased risk for fall, in need of intervention  DGI     FGA     TUG 17.5 seconds  Increased risk for fall, in need of intervention  5TSTS 17.3 seconds  Increased risk for fall, in need of intervention  6 Minute Walk Test     10 Meter Gait Speed Self-selected: 17.0s = 0.59 m/s; Fastest: 12.4s = 0.81 m/s  Below threshold for full community mobility  (Blank rows = not tested)  TODAY'S TREATMENT   SUBJECTIVE: Pt reports that she is doing well today. She continues to stay active at home and complete her HEP. No specific questions currently.    PAIN: Chronic L shoulder pain, not related to current episode;   Ther-ex NuStep L1-3 x 7 minutes for LE strengthening with therapist adjusting resistance and monitoring fatigue;  Standing hip strengthening with 7.5# ankle weights (AW) in // bars: Hip flexion marches x 15 BLE; HS curls x 15 BLE; Hip abduction x 15 BLE; Hip extension x 15 BLE;  Seated LAQ with 7.5# AW 2 x 10 BLE;   Neuromuscular Re-education: 10 Updated outcome measures/goals with patient (see goal section);   Not performed: Stair ascend/descend without UE support with reciprocal pattern 4 steps x 4 bouts; Airex alternating 6" toe taps x 10 BLE; Airex staggered stance balance with front foot on 6" step alternating LE x 30s each; Airex feet together (FT) eyes open/closed x 30s each; Airex FT horizontal and vertical head turns x 30s each; Airex balance beam side stepping x multiple lengths; Ladder side stepping 12' x multiple lengths; Seated clams with manual resistance from therapist x 10 BLE; Seated adductor squeezes with manual resistance from therapist x 10 BLE; Rockerboard balance in A/P and R/L orientation x mutiple trials in each orientation; Rockerboard anterior/posterior weight shifts in A/P orientation x 10 each direction; Forward 6" hurdle stepping obstacle course x multiple lengths; Lateral 6" hurdle stepping obstacle course x multiple lengths; Tandem  balance alternating forward LE x 30s each; Tandem gait in // bars without UE support x 4 lengths; Clock stepping/lunges x multiple bouts with each leg; Sit to stand with 6# overhead med ball press x 10; Sit to stand for speed x 30s (10 reps); 6" forward step-ups alternating leading LE x 10 on each side; Double black tband resisted gait forward, backward, R lateral, and L lateral x 5 each direction;    PATIENT EDUCATION:  Education details: Pt educated throughout session about proper posture and technique with exercises. Improved exercise technique, movement at target joints, use of target muscles after min to mod verbal, visual, tactile cues. Balance exercises Person educated: Patient Education method: Explanation, Demonstration, Verbal cues Education comprehension: verbalized understanding, returned demonstration, and verbal cues required   HOME EXERCISE PROGRAM:  Access Code: [email or web address] URL: [email or web address] Date: 01/22/2023 Prepared by: Ria Comment  Exercises - Sit to Stand Without Arm Support  - 1 x daily - 7 x weekly - 2 sets - 10 reps - Standing Hip Abduction with Counter Support  - 1 x daily - 7 x weekly - 2 sets - 10 reps - 3s hold - Heel Raises with Counter Support  - 1 x daily - 7 x weekly - 2 sets - 10 reps - 3s hold - Standing March with Counter Support  - 1 x daily - 7 x weekly - 2 sets - 10 reps - 3s hold - Corner Balance Feet Together: Eyes Open With Head Turns  - 1 x daily - 7 x weekly - 3 reps - 30s hold   ASSESSMENT:  CLINICAL IMPRESSION: Patient demonstrates excellent motivation during session today. Updated outcome measures/goals with patient. Her FOTO improved to 51 and her ABC increased from 34.4% to 63.8%. Patient's balance improved with her BERG increasing from 42/56 at the initial evaluation to 52/56 today. Her TUG also improved from 17.5s to 10.3s and her 5TSTS improved from 17.3s to 10.9s. Progressed strengthening exercises during session  today. Overall balance and strength are  improving. Plan is to continue therapy until no further benefit is achieved. Pt will benefit from PT services to address deficits in strength, balance, and mobility in order to return to full function at home and decrease her risk for falls.   OBJECTIVE IMPAIRMENTS: decreased balance, difficulty walking, and decreased strength.   ACTIVITY LIMITATIONS: squatting, stairs, and transfers  PARTICIPATION LIMITATIONS: cleaning, laundry, shopping, and community activity  PERSONAL FACTORS: Age, Past/current experiences, Time since onset of injury/illness/exacerbation, and 3+ comorbidities: DM, HTN, and colon CA  are also affecting patient's functional outcome.   REHAB POTENTIAL: Good  CLINICAL DECISION MAKING: Unstable/unpredictable  EVALUATION COMPLEXITY: High   GOALS: Goals reviewed with patient? Yes  SHORT TERM GOALS: Target date: 02/17/2023  Pt will be independent with HEP in order to improve strength and balance in order to decrease fall risk and improve function at home. Baseline:  Goal status: PARTIALLY MET   LONG TERM GOALS: Target date: 03/17/2023  Pt will increase FOTO to at least 54 to demonstrate significant improvement in function at home related to balance  Baseline: 01/20/23: 48; 02/19/23: 49; 03/17/23: 51 Goal status: PARTIALLY MET  2.  Pt will improve BERG to at least 52/56 in order to demonstrate clinically significant improvement in balance.   Baseline: 01/20/23: To be completed; 01/22/23: 42/56; 02/19/23: 49/56; 03/17/23: 52/56; Goal status: ACHIEVED  3.  Pt will improve ABC to at least 67% in order to demonstrate clinically significant improvement in balance confidence.      Baseline: 01/20/23: To be completed; 01/22/23: 34.4%; 02/19/23: 51.3%; 03/17/23: 63.8% Goal status: PARTIALLY MET  4. Pt will decrease 5TSTS by at least 3 seconds in order to demonstrate clinically significant improvement in LE strength      Baseline: 01/20/23: 17.3s; 02/19/23:  12.4s; 03/17/23: 10.9s; Goal status: ACHIEVED  5. Pt will decrease TUG to below 14 seconds/decrease in order to demonstrate decreased fall risk.  Baseline: 01/20/23: 17.5s; 02/19/23: 12.9s; 03/17/23: 10.3s;  Goal status: ACHIEVED  PLAN: PT FREQUENCY: 2x/week  PT DURATION: 8 weeks  PLANNED INTERVENTIONS: Therapeutic exercises, Therapeutic activity, Neuromuscular re-education, Balance training, Gait training, Patient/Family education, Self Care, Joint mobilization, Joint manipulation, Vestibular training, Canalith repositioning, Orthotic/Fit training, DME instructions, Dry Needling, Electrical stimulation, Spinal manipulation, Spinal mobilization, Cryotherapy, Moist heat, Taping, Traction, Ultrasound, Ionotophoresis 4mg /ml Dexamethasone, Manual therapy, and Re-evaluation.  PLAN FOR NEXT SESSION: progress balance/strength exercises, review/modify HEP as needed;   Lynnea Maizes PT, DPT, GCS  10:19 PM,03/17/23 [facility] [address] [place], [place], [address] Phone: [phone]   Fax:  [phone]

## 2023-03-19 ENCOUNTER — Other Ambulatory Visit: Payer: Self-pay | Admitting: Internal Medicine

## 2023-03-19 DIAGNOSIS — H6123 Impacted cerumen, bilateral: Secondary | ICD-10-CM | POA: Diagnosis not present

## 2023-03-19 DIAGNOSIS — H6063 Unspecified chronic otitis externa, bilateral: Secondary | ICD-10-CM | POA: Diagnosis not present

## 2023-03-19 DIAGNOSIS — H903 Sensorineural hearing loss, bilateral: Secondary | ICD-10-CM | POA: Diagnosis not present

## 2023-03-19 DIAGNOSIS — E1169 Type 2 diabetes mellitus with other specified complication: Secondary | ICD-10-CM

## 2023-03-19 DIAGNOSIS — E118 Type 2 diabetes mellitus with unspecified complications: Secondary | ICD-10-CM

## 2023-03-23 DIAGNOSIS — H0288B Meibomian gland dysfunction left eye, upper and lower eyelids: Secondary | ICD-10-CM | POA: Diagnosis not present

## 2023-03-23 DIAGNOSIS — H0288A Meibomian gland dysfunction right eye, upper and lower eyelids: Secondary | ICD-10-CM | POA: Diagnosis not present

## 2023-03-23 DIAGNOSIS — H16223 Keratoconjunctivitis sicca, not specified as Sjogren's, bilateral: Secondary | ICD-10-CM | POA: Diagnosis not present

## 2023-03-23 DIAGNOSIS — L718 Other rosacea: Secondary | ICD-10-CM | POA: Diagnosis not present

## 2023-03-24 ENCOUNTER — Ambulatory Visit: Payer: Medicare Other | Attending: Internal Medicine

## 2023-03-24 DIAGNOSIS — R2681 Unsteadiness on feet: Secondary | ICD-10-CM

## 2023-03-24 NOTE — Therapy (Unsigned)
OUTPATIENT PHYSICAL THERAPY BALANCE TREATMENT  Patient Name: [name] MRN: [id number] DOB:02-Feb-1946, 77 y.o., female Today's Date: 03/25/2023  END OF SESSION:  PT End of Session - 03/25/23 2219     Visit Number 18    Number of Visits 33    Date for PT Re-Evaluation 05/12/23    Authorization Type eval: 01/20/23    PT Start Time 1445    PT Stop Time 1530    PT Time Calculation (min) 45 min    Equipment Utilized During Treatment Gait belt    Activity Tolerance Patient tolerated treatment well    Behavior During Therapy WFL for tasks assessed/performed            Past Medical History:  Diagnosis Date   Allergy    Arthritis of knee, degenerative 05/08/2015   Basal cell carcinoma 03/2019   nose   Colon cancer (HCC) 2014   Partial colon resection, chemo + rad tx's.    Complication of anesthesia    Diabetes mellitus without complication (HCC)    Pt takes Metformin.   Hyperlipidemia    Hypertension    PONV (postoperative nausea and vomiting)    SBO (small bowel obstruction) (HCC) 04/20/2018   Seizures (HCC)    Umbilical hernia with obstruction but no gangrene    Vitamin D deficiency    Past Surgical History:  Procedure Laterality Date   CATARACT EXTRACTION, BILATERAL  01/2020   Dr. Zenaida Niece [facility]   COLOSTOMY REVERSAL  01/2014   ILEOSTOMY  05/2013   UMBILICAL HERNIA REPAIR  05/2018   Patient Active Problem List   Diagnosis Date Noted   Stage 3a chronic kidney disease (CKD) (HCC) 12/30/2022   Trigger finger, left ring finger 12/27/2021   Primary osteoarthritis of both hips 06/10/2019   Bilateral carotid artery stenosis 08/24/2018   Atherosclerosis of aorta (HCC) 08/24/2018   Type II diabetes mellitus with complication (HCC) 08/18/2018   Bradycardia 07/24/2018   Neoplasm of uncertain behavior of skin 01/16/2017   Senile ecchymosis 08/20/2015   Essential (primary) hypertension 05/08/2015   History of rectal cancer 05/08/2015   Hyperlipidemia associated with type 2  diabetes mellitus (HCC) 05/08/2015   Seizure disorder (HCC) 05/08/2015   Shoulder strain 05/08/2015   Avitaminosis D 05/08/2015   PCP: Reubin Milan, MD  REFERRING PROVIDER: Reubin Milan, MD   REFERRING DIAG: R26.9 (ICD-10-CM) - Gait disturbance   RATIONALE FOR EVALUATION AND TREATMENT: Rehabilitation  THERAPY DIAG: Unsteadiness on feet  ONSET DATE: 01/17/22 (approximate)  FOLLOW-UP APPT SCHEDULED WITH REFERRING PROVIDER: Yes (in 4 months per patient)  FROM INITIAL EVALUATION (01/20/23) SUBJECTIVE:  SUBJECTIVE STATEMENT:  Unsteadiness  PERTINENT HISTORY:  Pt complains of intermittent difficulty with her balance over the period of multiple months. She fell 6 months ago and fractured multiple ribs. She has noticed when walking across the grass she needs additional support. She uses cart when at the grocery store. She reports some urinary and bowel urge incontinence.    Pain: Yes, knee, back, and shoulder pain; Numbness/Tingling: Yes, occasional numbness in L foot and R hand. 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  Focal Weakness: No, generalized LE weakness Recent changes in overall health/medication: No Prior history of physical therapy for balance:  No Dominant hand: left Red flags: Pt reports some nighttime urinary incontinence and some daily bowel issues, history of colon cancer, occasional chills; Denies h/o spinal tumors, h/o compression fx, abdominal pain, fever, night sweats, nausea, vomiting;  PRECAUTIONS: None  WEIGHT BEARING RESTRICTIONS: No  FALLS: Has patient fallen in last 6 months? Yes. Number of falls 1  (a couple falls before that)  Living Environment Lives with: lives with their family, with daughter Lives in: House/apartment, one level Stairs: Yes: External: 3 steps; on right going up Has  following equipment at home: Environmental consultant - 2 wheeled, Shower bench, Grab bars, and handicap height commode  Prior level of function: Independent with basic ADLs, dtr provides some assist IADLs  Occupational demands: Retired, worked in [employer]  Hobbies: [place], watching television, crafts;  Patient Goals: Pt would like to remain independent as long as possible  OBJECTIVE:  Patient Surveys  FOTO: 48, predicted improvement to 73;  ABC: To be completed;  Cognition Patient is oriented to person, place, and time.  Recent memory and remote memory appear slightly impaired; Attention span and concentration are intact.  Expressive speech is intact.  Patient's fund of knowledge is within normal limits for educational level.    Gross Musculoskeletal Assessment Tremor: None Bulk: Normal Tone: Normal  Posture: Forward head and rounded shoulders  LE MMT: MMT (out of 5) Right  Left   Hip flexion 4+ 4+  Hip extension    Hip abduction    Hip adduction    Hip internal rotation    Hip external rotation    Knee flexion 5 5  Knee extension 5 5  Ankle dorsiflexion 4 4  Ankle plantarflexion    Ankle inversion    Ankle eversion    (* = pain; Blank rows = not tested)  Transfers: Assistive device utilized: None  Sit to stand: Complete Independence Stand to sit: Complete Independence Chair to chair: Complete Independence Floor:  Not tested  Stairs: Level of Assistance: Modified independence Stair Negotiation Technique: Step to Pattern with Bilateral Rails Number of Stairs: 4  Height of Stairs: 6"  Comments: Decreased speed with step-to pattern;  Gait: Gait pattern: step through pattern, decreased step length- Right, and decreased step length- Left Distance walked: 100' Assistive device utilized: None Level of assistance: Complete Independence Comments: Decreased self-selected gait speed  Functional Outcome Measures  01/20/23 01/22/23 Comments  BERG  42/56 Increased risk for fall, in  need of intervention  DGI     FGA     TUG 17.5 seconds  Increased risk for fall, in need of intervention  5TSTS 17.3 seconds  Increased risk for fall, in need of intervention  6 Minute Walk Test     10 Meter Gait Speed Self-selected: 17.0s = 0.59 m/s; Fastest: 12.4s = 0.81 m/s  Below threshold for full community mobility  (Blank rows = not tested)  TODAY'S TREATMENT   SUBJECTIVE: Pt reports that she is doing well today. She continues to stay active at home and complete her HEP. No specific questions currently.    PAIN: Chronic L shoulder pain, not related to current episode;   Ther-ex NuStep L1-4 x 7 minutes for LE strengthening with therapist adjusting resistance and monitoring fatigue;  6" forward step-ups alternating leading LE x 10 on each side; Sit to stand for speed x 30s (10 reps); Heel/toe raises without UE support, 3s hold, x 10 each direction; Squats x 10; Sit to stand for speed x 30s (14 reps); Seated LAQ with 7.5# AW x 10 BLE;  Standing hip strengthening with 7.5# ankle weights (AW) in // bars: Hip flexion marches x 15 BLE; HS curls x 15 BLE; Hip abduction x 15 BLE; Hip extension x 15 BLE;   Neuromuscular Re-education: Alternating 6" toe taps x 10 BLE; Tandem gait forward 12' x 2; Agility ladder side stepping 12' x 2;   Not performed: Stair ascend/descend without UE support with reciprocal pattern 4 steps x 4 bouts; Seated clams with manual resistance from therapist x 10 BLE; Seated adductor squeezes with manual resistance from therapist x 10 BLE; Rockerboard balance in A/P and R/L orientation x mutiple trials in each orientation; Rockerboard anterior/posterior weight shifts in A/P orientation x 10 each direction; Forward 6" hurdle stepping obstacle course x multiple lengths; Lateral 6" hurdle stepping obstacle course x multiple lengths; Clock stepping/lunges x multiple bouts with each leg; Sit to stand with 6# overhead med ball press x 10; Double  black tband resisted gait forward, backward, R lateral, and L lateral x 5 each direction;  Forward/backward gait outside in grass x 70's each; Forward gait outside in grass with vertical ball lifts with head/eye follow 2 x 70'; Forward gait outside in grass with horizontal ball pass between hands with head/eye follow 2 x 70'; Forward gait outside in grass with horizontal ball pass to therapist with head/eye follow x 70' toward each side; Stair ascend/descend without UE support with reciprocal pattern 4 steps x 4 bouts;   PATIENT EDUCATION:  Education details: Pt educated throughout session about proper posture and technique with exercises. Improved exercise technique, movement at target joints, use of target muscles after min to mod verbal, visual, tactile cues. Balance exercises Person educated: Patient Education method: Explanation, Demonstration, Verbal cues Education comprehension: verbalized understanding, returned demonstration, and verbal cues required   HOME EXERCISE PROGRAM:  Access Code: [email or web address] URL: [email or web address] Date: 01/22/2023 Prepared by: Ria Comment  Exercises - Sit to Stand Without Arm Support  - 1 x daily - 7 x weekly - 2 sets - 10 reps - Standing Hip Abduction with Counter Support  - 1 x daily - 7 x weekly - 2 sets - 10 reps - 3s hold - Heel Raises with Counter Support  - 1 x daily - 7 x weekly - 2 sets - 10 reps - 3s hold - Standing March with Counter Support  - 1 x daily - 7 x weekly - 2 sets - 10 reps - 3s hold - Corner Balance Feet Together: Eyes Open With Head Turns  - 1 x daily - 7 x weekly - 3 reps - 30s hold   ASSESSMENT:  CLINICAL IMPRESSION: Patient demonstrates excellent motivation during session today. Progressed strengthening and balance exercises during session today. Overall balance and strength are improving. Plan is to continue therapy until no further benefit is achieved. Pt will benefit from PT services to address  deficits  in strength, balance, and mobility in order to return to full function at home and decrease her risk for falls.   OBJECTIVE IMPAIRMENTS: decreased balance, difficulty walking, and decreased strength.   ACTIVITY LIMITATIONS: squatting, stairs, and transfers  PARTICIPATION LIMITATIONS: cleaning, laundry, shopping, and community activity  PERSONAL FACTORS: Age, Past/current experiences, Time since onset of injury/illness/exacerbation, and 3+ comorbidities: DM, HTN, and colon CA  are also affecting patient's functional outcome.   REHAB POTENTIAL: Good  CLINICAL DECISION MAKING: Unstable/unpredictable  EVALUATION COMPLEXITY: High   GOALS: Goals reviewed with patient? Yes  SHORT TERM GOALS: Target date: 02/17/2023  Pt will be independent with HEP in order to improve strength and balance in order to decrease fall risk and improve function at home. Baseline:  Goal status: PARTIALLY MET   LONG TERM GOALS: Target date: 03/17/2023  Pt will increase FOTO to at least 54 to demonstrate significant improvement in function at home related to balance  Baseline: 01/20/23: 48; 02/19/23: 49; 03/17/23: 51 Goal status: PARTIALLY MET  2.  Pt will improve BERG to at least 52/56 in order to demonstrate clinically significant improvement in balance.   Baseline: 01/20/23: To be completed; 01/22/23: 42/56; 02/19/23: 49/56; 03/17/23: 52/56; Goal status: ACHIEVED  3.  Pt will improve ABC to at least 67% in order to demonstrate clinically significant improvement in balance confidence.      Baseline: 01/20/23: To be completed; 01/22/23: 34.4%; 02/19/23: 51.3%; 03/17/23: 63.8% Goal status: PARTIALLY MET  4. Pt will decrease 5TSTS by at least 3 seconds in order to demonstrate clinically significant improvement in LE strength      Baseline: 01/20/23: 17.3s; 02/19/23: 12.4s; 03/17/23: 10.9s; Goal status: ACHIEVED  5. Pt will decrease TUG to below 14 seconds/decrease in order to demonstrate decreased fall risk.  Baseline: 01/20/23:  17.5s; 02/19/23: 12.9s; 03/17/23: 10.3s;  Goal status: ACHIEVED  PLAN: PT FREQUENCY: 2x/week  PT DURATION: 8 weeks  PLANNED INTERVENTIONS: Therapeutic exercises, Therapeutic activity, Neuromuscular re-education, Balance training, Gait training, Patient/Family education, Self Care, Joint mobilization, Joint manipulation, Vestibular training, Canalith repositioning, Orthotic/Fit training, DME instructions, Dry Needling, Electrical stimulation, Spinal manipulation, Spinal mobilization, Cryotherapy, Moist heat, Taping, Traction, Ultrasound, Ionotophoresis 4mg /ml Dexamethasone, Manual therapy, and Re-evaluation.  PLAN FOR NEXT SESSION: progress balance/strength exercises, review/modify HEP as needed;   Lynnea Maizes PT, DPT, GCS  10:24 PM,03/25/23 [facility] [address] [place], [place], [address] Phone: [phone]   Fax:  [phone]

## 2023-03-26 ENCOUNTER — Ambulatory Visit: Payer: Medicare Other

## 2023-03-26 DIAGNOSIS — R2681 Unsteadiness on feet: Secondary | ICD-10-CM | POA: Diagnosis not present

## 2023-03-26 NOTE — Therapy (Signed)
OUTPATIENT PHYSICAL THERAPY BALANCE TREATMENT  Patient Name: [name] MRN: [id number] DOB:06-15-1946, 77 y.o., female Today's Date: 03/27/2023  END OF SESSION:  PT End of Session - 03/26/23 1451     Visit Number 19    Number of Visits 33    Date for PT Re-Evaluation 05/12/23    Authorization Type eval: 01/20/23    PT Start Time 1449    PT Stop Time 1530    PT Time Calculation (min) 41 min    Equipment Utilized During Treatment Gait belt    Activity Tolerance Patient tolerated treatment well    Behavior During Therapy WFL for tasks assessed/performed            Past Medical History:  Diagnosis Date   Allergy    Arthritis of knee, degenerative 05/08/2015   Basal cell carcinoma 03/2019   nose   Colon cancer (HCC) 2014   Partial colon resection, chemo + rad tx's.    Complication of anesthesia    Diabetes mellitus without complication (HCC)    Pt takes Metformin.   Hyperlipidemia    Hypertension    PONV (postoperative nausea and vomiting)    SBO (small bowel obstruction) (HCC) 04/20/2018   Seizures (HCC)    Umbilical hernia with obstruction but no gangrene    Vitamin D deficiency    Past Surgical History:  Procedure Laterality Date   CATARACT EXTRACTION, BILATERAL  01/2020   Dr. Zenaida Niece [facility]   COLOSTOMY REVERSAL  01/2014   ILEOSTOMY  05/2013   UMBILICAL HERNIA REPAIR  05/2018   Patient Active Problem List   Diagnosis Date Noted   Stage 3a chronic kidney disease (CKD) (HCC) 12/30/2022   Trigger finger, left ring finger 12/27/2021   Primary osteoarthritis of both hips 06/10/2019   Bilateral carotid artery stenosis 08/24/2018   Atherosclerosis of aorta (HCC) 08/24/2018   Type II diabetes mellitus with complication (HCC) 08/18/2018   Bradycardia 07/24/2018   Neoplasm of uncertain behavior of skin 01/16/2017   Senile ecchymosis 08/20/2015   Essential (primary) hypertension 05/08/2015   History of rectal cancer 05/08/2015   Hyperlipidemia associated with type 2  diabetes mellitus (HCC) 05/08/2015   Seizure disorder (HCC) 05/08/2015   Shoulder strain 05/08/2015   Avitaminosis D 05/08/2015   PCP: Reubin Milan, MD  REFERRING PROVIDER: Reubin Milan, MD   REFERRING DIAG: R26.9 (ICD-10-CM) - Gait disturbance   RATIONALE FOR EVALUATION AND TREATMENT: Rehabilitation  THERAPY DIAG: Unsteadiness on feet  ONSET DATE: 01/17/22 (approximate)  FOLLOW-UP APPT SCHEDULED WITH REFERRING PROVIDER: Yes (in 4 months per patient)  FROM INITIAL EVALUATION (01/20/23) SUBJECTIVE:  SUBJECTIVE STATEMENT:  Unsteadiness  PERTINENT HISTORY:  Pt complains of intermittent difficulty with her balance over the period of multiple months. She fell 6 months ago and fractured multiple ribs. She has noticed when walking across the grass she needs additional support. She uses cart when at the grocery store. She reports some urinary and bowel urge incontinence.    Pain: Yes, knee, back, and shoulder pain; Numbness/Tingling: Yes, occasional numbness in L foot and R hand. 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  Focal Weakness: No, generalized LE weakness Recent changes in overall health/medication: No Prior history of physical therapy for balance:  No Dominant hand: left Red flags: Pt reports some nighttime urinary incontinence and some daily bowel issues, history of colon cancer, occasional chills; Denies h/o spinal tumors, h/o compression fx, abdominal pain, fever, night sweats, nausea, vomiting;  PRECAUTIONS: None  WEIGHT BEARING RESTRICTIONS: No  FALLS: Has patient fallen in last 6 months? Yes. Number of falls 1  (a couple falls before that)  Living Environment Lives with: lives with their family, with daughter Lives in: House/apartment, one level Stairs: Yes: External: 3 steps; on right going up Has  following equipment at home: Environmental consultant - 2 wheeled, Shower bench, Grab bars, and handicap height commode  Prior level of function: Independent with basic ADLs, dtr provides some assist IADLs  Occupational demands: Retired, worked in [employer]  Hobbies: [place], watching television, crafts;  Patient Goals: Pt would like to remain independent as long as possible  OBJECTIVE:  Patient Surveys  FOTO: 48, predicted improvement to 73;  ABC: To be completed;  Cognition Patient is oriented to person, place, and time.  Recent memory and remote memory appear slightly impaired; Attention span and concentration are intact.  Expressive speech is intact.  Patient's fund of knowledge is within normal limits for educational level.    Gross Musculoskeletal Assessment Tremor: None Bulk: Normal Tone: Normal  Posture: Forward head and rounded shoulders  LE MMT: MMT (out of 5) Right  Left   Hip flexion 4+ 4+  Hip extension    Hip abduction    Hip adduction    Hip internal rotation    Hip external rotation    Knee flexion 5 5  Knee extension 5 5  Ankle dorsiflexion 4 4  Ankle plantarflexion    Ankle inversion    Ankle eversion    (* = pain; Blank rows = not tested)  Transfers: Assistive device utilized: None  Sit to stand: Complete Independence Stand to sit: Complete Independence Chair to chair: Complete Independence Floor:  Not tested  Stairs: Level of Assistance: Modified independence Stair Negotiation Technique: Step to Pattern with Bilateral Rails Number of Stairs: 4  Height of Stairs: 6"  Comments: Decreased speed with step-to pattern;  Gait: Gait pattern: step through pattern, decreased step length- Right, and decreased step length- Left Distance walked: 100' Assistive device utilized: None Level of assistance: Complete Independence Comments: Decreased self-selected gait speed  Functional Outcome Measures  01/20/23 01/22/23 Comments  BERG  42/56 Increased risk for fall, in  need of intervention  DGI     FGA     TUG 17.5 seconds  Increased risk for fall, in need of intervention  5TSTS 17.3 seconds  Increased risk for fall, in need of intervention  6 Minute Walk Test     10 Meter Gait Speed Self-selected: 17.0s = 0.59 m/s; Fastest: 12.4s = 0.81 m/s  Below threshold for full community mobility  (Blank rows = not tested)  TODAY'S TREATMENT   SUBJECTIVE: Pt reports that she is doing well today. She continues to stay active at home and complete her HEP without issue. No specific questions currently.    PAIN: Chronic L shoulder pain, not related to current episode;   Neuromuscular Re-education: NuStep L1-4 x 7 minutes for LE strengthening with therapist adjusting resistance and monitoring fatigue; Forward gait across mulch x multiple bouts; Forward/backward gait outside in grass x 70's each; Forward gait outside in grass with eyes closed x 70'; Side stepping outside in grass x 40' each direction; Cross-over stepping outside in grass x 40' each direction; Curb practice forward, backward, and lateral (bilaterally) x multiple bouts each; Tandem gait forward 12' x 4; Forward 6" and 12" hurdle stepping obstacle course in // bars x multiple lengths; Rockerboard balance in A/P and R/L orientation x mutiple trials in each orientation; Rockerboard anterior/posterior weight shifts in A/P orientation x 10 each direction;   Not performed: Seated clams with manual resistance from therapist x 10 BLE; Seated adductor squeezes with manual resistance from therapist x 10 BLE; Clock stepping/lunges x multiple bouts with each leg; Double black tband resisted gait forward, backward, R lateral, and L lateral x 5 each direction;  Agility ladder side stepping 12' x 2; Heel/toe raises without UE support, 3s hold, x 10 each direction; Squats x 10; Sit to stand with 6# overhead med ball press x 10; Sit to stand for speed x 30s (14 reps); Seated LAQ with 7.5# AW x 10  BLE; Standing hip strengthening with 7.5# ankle weights (AW) in // bars: Hip flexion marches x 15 BLE; HS curls x 15 BLE; Hip abduction x 15 BLE; Hip extension x 15 BLE;   PATIENT EDUCATION:  Education details: Pt educated throughout session about proper posture and technique with exercises. Improved exercise technique, movement at target joints, use of target muscles after min to mod verbal, visual, tactile cues. Balance exercises Person educated: Patient Education method: Explanation, Demonstration, Verbal cues Education comprehension: verbalized understanding, returned demonstration, and verbal cues required   HOME EXERCISE PROGRAM:  Access Code: [email or web address] URL: [email or web address] Date: 01/22/2023 Prepared by: Ria Comment  Exercises - Sit to Stand Without Arm Support  - 1 x daily - 7 x weekly - 2 sets - 10 reps - Standing Hip Abduction with Counter Support  - 1 x daily - 7 x weekly - 2 sets - 10 reps - 3s hold - Heel Raises with Counter Support  - 1 x daily - 7 x weekly - 2 sets - 10 reps - 3s hold - Standing March with Counter Support  - 1 x daily - 7 x weekly - 2 sets - 10 reps - 3s hold - Corner Balance Feet Together: Eyes Open With Head Turns  - 1 x daily - 7 x weekly - 3 reps - 30s hold   ASSESSMENT:  CLINICAL IMPRESSION: Patient demonstrates excellent motivation during session today. Focused on balance exercises today with most of the time spent outside practicing on uneven surfaces. She is demonstrating improving balance. Also practiced curb negotiation today. She will need a progress note at next visit. Plan is to continue therapy until no further benefit is achieved. Pt will benefit from PT services to address deficits in strength, balance, and mobility in order to return to full function at home and decrease her risk for falls.   OBJECTIVE IMPAIRMENTS: decreased balance, difficulty walking, and decreased strength.   ACTIVITY LIMITATIONS: squatting,  stairs, and transfers  PARTICIPATION LIMITATIONS:  cleaning, laundry, shopping, and community activity  PERSONAL FACTORS: Age, Past/current experiences, Time since onset of injury/illness/exacerbation, and 3+ comorbidities: DM, HTN, and colon CA  are also affecting patient's functional outcome.   REHAB POTENTIAL: Good  CLINICAL DECISION MAKING: Unstable/unpredictable  EVALUATION COMPLEXITY: High   GOALS: Goals reviewed with patient? Yes  SHORT TERM GOALS: Target date: 02/17/2023  Pt will be independent with HEP in order to improve strength and balance in order to decrease fall risk and improve function at home. Baseline:  Goal status: PARTIALLY MET   LONG TERM GOALS: Target date: 03/17/2023  Pt will increase FOTO to at least 54 to demonstrate significant improvement in function at home related to balance  Baseline: 01/20/23: 48; 02/19/23: 49; 03/17/23: 51 Goal status: PARTIALLY MET  2.  Pt will improve BERG to at least 52/56 in order to demonstrate clinically significant improvement in balance.   Baseline: 01/20/23: To be completed; 01/22/23: 42/56; 02/19/23: 49/56; 03/17/23: 52/56; Goal status: ACHIEVED  3.  Pt will improve ABC to at least 67% in order to demonstrate clinically significant improvement in balance confidence.      Baseline: 01/20/23: To be completed; 01/22/23: 34.4%; 02/19/23: 51.3%; 03/17/23: 63.8% Goal status: PARTIALLY MET  4. Pt will decrease 5TSTS by at least 3 seconds in order to demonstrate clinically significant improvement in LE strength      Baseline: 01/20/23: 17.3s; 02/19/23: 12.4s; 03/17/23: 10.9s; Goal status: ACHIEVED  5. Pt will decrease TUG to below 14 seconds/decrease in order to demonstrate decreased fall risk.  Baseline: 01/20/23: 17.5s; 02/19/23: 12.9s; 03/17/23: 10.3s;  Goal status: ACHIEVED  PLAN: PT FREQUENCY: 2x/week  PT DURATION: 8 weeks  PLANNED INTERVENTIONS: Therapeutic exercises, Therapeutic activity, Neuromuscular re-education, Balance training, Gait  training, Patient/Family education, Self Care, Joint mobilization, Joint manipulation, Vestibular training, Canalith repositioning, Orthotic/Fit training, DME instructions, Dry Needling, Electrical stimulation, Spinal manipulation, Spinal mobilization, Cryotherapy, Moist heat, Taping, Traction, Ultrasound, Ionotophoresis 4mg /ml Dexamethasone, Manual therapy, and Re-evaluation.  PLAN FOR NEXT SESSION: progress note, progress balance/strength exercises, review/modify HEP as needed;   Lynnea Maizes PT, DPT, GCS  10:39 AM,03/27/23 [facility] [address] [place], [place], [address] Phone: [phone]   Fax:  [phone]

## 2023-03-30 ENCOUNTER — Ambulatory Visit: Payer: Medicare Other

## 2023-03-30 DIAGNOSIS — R2681 Unsteadiness on feet: Secondary | ICD-10-CM | POA: Diagnosis not present

## 2023-03-30 NOTE — Therapy (Signed)
OUTPATIENT PHYSICAL THERAPY BALANCE TREATMENT/PROGRESS NOTE  Dates of reporting period  02/19/23   to   03/30/23   Patient Name: [name] MRN: [id number] DOB:06/14/1946, 77 y.o., female Today's Date: 03/31/2023  END OF SESSION:  PT End of Session - 03/30/23 1150     Visit Number 20    Number of Visits 33    Date for PT Re-Evaluation 05/12/23    Authorization Type eval: 01/20/23    PT Start Time 1148    PT Stop Time 1230    PT Time Calculation (min) 42 min    Equipment Utilized During Treatment Gait belt    Activity Tolerance Patient tolerated treatment well    Behavior During Therapy WFL for tasks assessed/performed            Past Medical History:  Diagnosis Date   Allergy    Arthritis of knee, degenerative 05/08/2015   Basal cell carcinoma 03/2019   nose   Colon cancer (HCC) 2014   Partial colon resection, chemo + rad tx's.    Complication of anesthesia    Diabetes mellitus without complication (HCC)    Pt takes Metformin.   Hyperlipidemia    Hypertension    PONV (postoperative nausea and vomiting)    SBO (small bowel obstruction) (HCC) 04/20/2018   Seizures (HCC)    Umbilical hernia with obstruction but no gangrene    Vitamin D deficiency    Past Surgical History:  Procedure Laterality Date   CATARACT EXTRACTION, BILATERAL  01/2020   Dr. Zenaida Niece [facility]   COLOSTOMY REVERSAL  01/2014   ILEOSTOMY  05/2013   UMBILICAL HERNIA REPAIR  05/2018   Patient Active Problem List   Diagnosis Date Noted   Stage 3a chronic kidney disease (CKD) (HCC) 12/30/2022   Trigger finger, left ring finger 12/27/2021   Primary osteoarthritis of both hips 06/10/2019   Bilateral carotid artery stenosis 08/24/2018   Atherosclerosis of aorta (HCC) 08/24/2018   Type II diabetes mellitus with complication (HCC) 08/18/2018   Bradycardia 07/24/2018   Neoplasm of uncertain behavior of skin 01/16/2017   Senile ecchymosis 08/20/2015   Essential (primary) hypertension 05/08/2015   History of  rectal cancer 05/08/2015   Hyperlipidemia associated with type 2 diabetes mellitus (HCC) 05/08/2015   Seizure disorder (HCC) 05/08/2015   Shoulder strain 05/08/2015   Avitaminosis D 05/08/2015   PCP: Reubin Milan, MD  REFERRING PROVIDER: Reubin Milan, MD   REFERRING DIAG: R26.9 (ICD-10-CM) - Gait disturbance   RATIONALE FOR EVALUATION AND TREATMENT: Rehabilitation  THERAPY DIAG: Unsteadiness on feet  ONSET DATE: 01/17/22 (approximate)  FOLLOW-UP APPT SCHEDULED WITH REFERRING PROVIDER: Yes (in 4 months per patient)  FROM INITIAL EVALUATION (01/20/23) SUBJECTIVE:  SUBJECTIVE STATEMENT:  Unsteadiness  PERTINENT HISTORY:  Pt complains of intermittent difficulty with her balance over the period of multiple months. She fell 6 months ago and fractured multiple ribs. She has noticed when walking across the grass she needs additional support. She uses cart when at the grocery store. She reports some urinary and bowel urge incontinence.    Pain: Yes, knee, back, and shoulder pain; Numbness/Tingling: Yes, occasional numbness in L foot and R hand. 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  Focal Weakness: No, generalized LE weakness Recent changes in overall health/medication: No Prior history of physical therapy for balance:  No Dominant hand: left Red flags: Pt reports some nighttime urinary incontinence and some daily bowel issues, history of colon cancer, occasional chills; Denies h/o spinal tumors, h/o compression fx, abdominal pain, fever, night sweats, nausea, vomiting;  PRECAUTIONS: None  WEIGHT BEARING RESTRICTIONS: No  FALLS: Has patient fallen in last 6 months? Yes. Number of falls 1  (a couple falls before that)  Living Environment Lives with: lives with their family, with daughter Lives in: House/apartment,  one level Stairs: Yes: External: 3 steps; on right going up Has following equipment at home: Environmental consultant - 2 wheeled, Shower bench, Grab bars, and handicap height commode  Prior level of function: Independent with basic ADLs, dtr provides some assist IADLs  Occupational demands: Retired, worked in Marine scientist  Hobbies: Walks, watching television, crafts;  Patient Goals: Pt would like to remain independent as long as possible  OBJECTIVE:  Patient Surveys  FOTO: 48, predicted improvement to 77;  ABC: To be completed;  Cognition Patient is oriented to person, place, and time.  Recent memory and remote memory appear slightly impaired; Attention span and concentration are intact.  Expressive speech is intact.  Patient's fund of knowledge is within normal limits for educational level.    Gross Musculoskeletal Assessment Tremor: None Bulk: Normal Tone: Normal  Posture: Forward head and rounded shoulders  LE MMT: MMT (out of 5) Right  Left   Hip flexion 4+ 4+  Hip extension    Hip abduction    Hip adduction    Hip internal rotation    Hip external rotation    Knee flexion 5 5  Knee extension 5 5  Ankle dorsiflexion 4 4  Ankle plantarflexion    Ankle inversion    Ankle eversion    (* = pain; Blank rows = not tested)  Transfers: Assistive device utilized: None  Sit to stand: Complete Independence Stand to sit: Complete Independence Chair to chair: Complete Independence Floor:  Not tested  Stairs: Level of Assistance: Modified independence Stair Negotiation Technique: Step to Pattern with Bilateral Rails Number of Stairs: 4  Height of Stairs: 6"  Comments: Decreased speed with step-to pattern;  Gait: Gait pattern: step through pattern, decreased step length- Right, and decreased step length- Left Distance walked: 100' Assistive device utilized: None Level of assistance: Complete Independence Comments: Decreased self-selected gait speed  Functional Outcome Measures   01/20/23 01/22/23 Comments  BERG  42/56 Increased risk for fall, in need of intervention  DGI     FGA     TUG 17.5 seconds  Increased risk for fall, in need of intervention  5TSTS 17.3 seconds  Increased risk for fall, in need of intervention  6 Minute Walk Test     10 Meter Gait Speed Self-selected: 17.0s = 0.59 m/s; Fastest: 12.4s = 0.81 m/s  Below threshold for full community mobility  (Blank rows = not tested)  TODAY'S TREATMENT   SUBJECTIVE: Pt reports that she is doing well today. She continues to stay active at home and complete her HEP without issue. No specific questions currently.    PAIN: Chronic L shoulder pain, not related to current episode;   Neuromuscular Re-education: NuStep L1-4 x 10 minutes for LE strengthening with therapist adjusting resistance and monitoring fatigue; Rockerboard balance in A/P and R/L orientation x mutiple trials in each orientation; Rockerboard anterior/posterior weight shifts in A/P orientation x 10 each direction; Airex tandem balance alternating forward LE x 30s each;  Airex balance beam tandem gait forward 6' x 4; Airex balance beam side stepping 6' x 4; Forward 6" and 12" hurdle stepping obstacle course in // bars x multiple lengths;   Not performed: Seated clams with manual resistance from therapist x 10 BLE; Seated adductor squeezes with manual resistance from therapist x 10 BLE; Clock stepping/lunges x multiple bouts with each leg; Double black tband resisted gait forward, backward, R lateral, and L lateral x 5 each direction;  Agility ladder side stepping 12' x 2; Heel/toe raises without UE support, 3s hold, x 10 each direction; Squats x 10; Sit to stand with 6# overhead med ball press x 10; Sit to stand for speed x 30s (14 reps); Seated LAQ with 7.5# AW x 10 BLE; Standing hip strengthening with 7.5# ankle weights (AW) in // bars: Hip flexion marches x 15 BLE; HS curls x 15 BLE; Hip abduction x 15 BLE; Hip extension x 15  BLE;    PATIENT EDUCATION:  Education details: Pt educated throughout session about proper posture and technique with exercises. Improved exercise technique, movement at target joints, use of target muscles after min to mod verbal, visual, tactile cues. Balance exercises Person educated: Patient Education method: Explanation, Demonstration, Verbal cues Education comprehension: verbalized understanding, returned demonstration, and verbal cues required   HOME EXERCISE PROGRAM:  Access Code: [email or web address] URL: [email or web address] Date: 01/22/2023 Prepared by: Ria Comment  Exercises - Sit to Stand Without Arm Support  - 1 x daily - 7 x weekly - 2 sets - 10 reps - Standing Hip Abduction with Counter Support  - 1 x daily - 7 x weekly - 2 sets - 10 reps - 3s hold - Heel Raises with Counter Support  - 1 x daily - 7 x weekly - 2 sets - 10 reps - 3s hold - Standing March with Counter Support  - 1 x daily - 7 x weekly - 2 sets - 10 reps - 3s hold - Corner Balance Feet Together: Eyes Open With Head Turns  - 1 x daily - 7 x weekly - 3 reps - 30s hold   ASSESSMENT:  CLINICAL IMPRESSION: Patient demonstrates excellent motivation during session today. Updated outcome measures/goals with patient on 03/17/23 so no need to update today. Her FOTO improved to 51 and her ABC increased from 34.4% to 63.8%. Patient's balance improved with her BERG increasing from 42/56 at the initial evaluation to 52/56. Her TUG also improved from 17.5s to 10.3s and her 5TSTS improved from 17.3s to 10.9s. Progressed balance exercises during session today. Overall she has demonstrated notable improvement in balance and strength. Plan is to continue therapy at one time per week with focus on home program until no further benefit is achieved. Pt will benefit from PT services to address deficits in strength, balance, and mobility in order to return to full function at home and decrease her risk for falls.    OBJECTIVE  IMPAIRMENTS: decreased  balance, difficulty walking, and decreased strength.   ACTIVITY LIMITATIONS: squatting, stairs, and transfers  PARTICIPATION LIMITATIONS: cleaning, laundry, shopping, and community activity  PERSONAL FACTORS: Age, Past/current experiences, Time since onset of injury/illness/exacerbation, and 3+ comorbidities: DM, HTN, and colon CA  are also affecting patient's functional outcome.   REHAB POTENTIAL: Good  CLINICAL DECISION MAKING: Unstable/unpredictable  EVALUATION COMPLEXITY: High   GOALS: Goals reviewed with patient? Yes  SHORT TERM GOALS: Target date: 02/17/2023  Pt will be independent with HEP in order to improve strength and balance in order to decrease fall risk and improve function at home. Baseline:  Goal status: PARTIALLY MET   LONG TERM GOALS: Target date: 03/17/2023  Pt will increase FOTO to at least 54 to demonstrate significant improvement in function at home related to balance  Baseline: 01/20/23: 48; 02/19/23: 49; 03/17/23: 51 Goal status: PARTIALLY MET  2.  Pt will improve BERG to at least 52/56 in order to demonstrate clinically significant improvement in balance.   Baseline: 01/20/23: To be completed; 01/22/23: 42/56; 02/19/23: 49/56; 03/17/23: 52/56; Goal status: ACHIEVED  3.  Pt will improve ABC to at least 67% in order to demonstrate clinically significant improvement in balance confidence.      Baseline: 01/20/23: To be completed; 01/22/23: 34.4%; 02/19/23: 51.3%; 03/17/23: 63.8% Goal status: PARTIALLY MET  4. Pt will decrease 5TSTS by at least 3 seconds in order to demonstrate clinically significant improvement in LE strength      Baseline: 01/20/23: 17.3s; 02/19/23: 12.4s; 03/17/23: 10.9s; Goal status: ACHIEVED  5. Pt will decrease TUG to below 14 seconds/decrease in order to demonstrate decreased fall risk.  Baseline: 01/20/23: 17.5s; 02/19/23: 12.9s; 03/17/23: 10.3s;  Goal status: ACHIEVED  PLAN: PT FREQUENCY: 2x/week  PT DURATION: 8 weeks  PLANNED  INTERVENTIONS: Therapeutic exercises, Therapeutic activity, Neuromuscular re-education, Balance training, Gait training, Patient/Family education, Self Care, Joint mobilization, Joint manipulation, Vestibular training, Canalith repositioning, Orthotic/Fit training, DME instructions, Dry Needling, Electrical stimulation, Spinal manipulation, Spinal mobilization, Cryotherapy, Moist heat, Taping, Traction, Ultrasound, Ionotophoresis 4mg /ml Dexamethasone, Manual therapy, and Re-evaluation.  PLAN FOR NEXT SESSION: progress balance/strength exercises, review/modify HEP as needed;   Lynnea Maizes PT, DPT, GCS  3:45 PM,03/31/23 [facility] [address] [place], [place], [address] Phone: [phone]   Fax:  [phone]

## 2023-03-31 NOTE — Therapy (Signed)
OUTPATIENT PHYSICAL THERAPY BALANCE TREATMENT  Patient Name: [name] MRN: [id number] DOB:Mar 23, 1946, 77 y.o., female Today's Date: 04/01/2023  END OF SESSION:  PT End of Session - 04/01/23 1157     Visit Number 21    Number of Visits 33    Date for PT Re-Evaluation 05/12/23    Authorization Type eval: 01/20/23    PT Start Time 1145    PT Stop Time 1230    PT Time Calculation (min) 45 min    Equipment Utilized During Treatment Gait belt    Activity Tolerance Patient tolerated treatment well    Behavior During Therapy WFL for tasks assessed/performed            Past Medical History:  Diagnosis Date   Allergy    Arthritis of knee, degenerative 05/08/2015   Basal cell carcinoma 03/2019   nose   Colon cancer (HCC) 2014   Partial colon resection, chemo + rad tx's.    Complication of anesthesia    Diabetes mellitus without complication (HCC)    Pt takes Metformin.   Hyperlipidemia    Hypertension    PONV (postoperative nausea and vomiting)    SBO (small bowel obstruction) (HCC) 04/20/2018   Seizures (HCC)    Umbilical hernia with obstruction but no gangrene    Vitamin D deficiency    Past Surgical History:  Procedure Laterality Date   CATARACT EXTRACTION, BILATERAL  01/2020   Dr. Zenaida Niece [facility]   COLOSTOMY REVERSAL  01/2014   ILEOSTOMY  05/2013   UMBILICAL HERNIA REPAIR  05/2018   Patient Active Problem List   Diagnosis Date Noted   Stage 3a chronic kidney disease (CKD) (HCC) 12/30/2022   Trigger finger, left ring finger 12/27/2021   Primary osteoarthritis of both hips 06/10/2019   Bilateral carotid artery stenosis 08/24/2018   Atherosclerosis of aorta (HCC) 08/24/2018   Type II diabetes mellitus with complication (HCC) 08/18/2018   Bradycardia 07/24/2018   Neoplasm of uncertain behavior of skin 01/16/2017   Senile ecchymosis 08/20/2015   Essential (primary) hypertension 05/08/2015   History of rectal cancer 05/08/2015   Hyperlipidemia associated with type 2  diabetes mellitus (HCC) 05/08/2015   Seizure disorder (HCC) 05/08/2015   Shoulder strain 05/08/2015   Avitaminosis D 05/08/2015   PCP: Reubin Milan, MD  REFERRING PROVIDER: Reubin Milan, MD   REFERRING DIAG: R26.9 (ICD-10-CM) - Gait disturbance   RATIONALE FOR EVALUATION AND TREATMENT: Rehabilitation  THERAPY DIAG: Unsteadiness on feet  ONSET DATE: 01/17/22 (approximate)  FOLLOW-UP APPT SCHEDULED WITH REFERRING PROVIDER: Yes (in 4 months per patient)  FROM INITIAL EVALUATION (01/20/23) SUBJECTIVE:  SUBJECTIVE STATEMENT:  Unsteadiness  PERTINENT HISTORY:  Pt complains of intermittent difficulty with her balance over the period of multiple months. She fell 6 months ago and fractured multiple ribs. She has noticed when walking across the grass she needs additional support. She uses cart when at the grocery store. She reports some urinary and bowel urge incontinence.    Pain: Yes, knee, back, and shoulder pain; Numbness/Tingling: Yes, occasional numbness in L foot and R hand. 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  Focal Weakness: No, generalized LE weakness Recent changes in overall health/medication: No Prior history of physical therapy for balance:  No Dominant hand: left Red flags: Pt reports some nighttime urinary incontinence and some daily bowel issues, history of colon cancer, occasional chills; Denies h/o spinal tumors, h/o compression fx, abdominal pain, fever, night sweats, nausea, vomiting;  PRECAUTIONS: None  WEIGHT BEARING RESTRICTIONS: No  FALLS: Has patient fallen in last 6 months? Yes. Number of falls 1  (a couple falls before that)  Living Environment Lives with: lives with their family, with daughter Lives in: House/apartment, one level Stairs: Yes: External: 3 steps; on right going up Has  following equipment at home: Environmental consultant - 2 wheeled, Shower bench, Grab bars, and handicap height commode  Prior level of function: Independent with basic ADLs, dtr provides some assist IADLs  Occupational demands: Retired, worked in Her[employer]  Hobbies: [place], watching television, crafts;  Patient Goals: Pt would like to remain independent as long as possible  OBJECTIVE:  Patient Surveys  FOTO: 48, predicted improvement to 73;  ABC: To be completed;  Cognition Patient is oriented to person, place, and time.  Recent memory and remote memory appear slightly impaired; Attention span and concentration are intact.  Expressive speech is intact.  Patient's fund of knowledge is within normal limits for educational level.    Gross Musculoskeletal Assessment Tremor: None Bulk: Normal Tone: Normal  Posture: Forward head and rounded shoulders  LE MMT: MMT (out of 5) Right  Left   Hip flexion 4+ 4+  Hip extension    Hip abduction    Hip adduction    Hip internal rotation    Hip external rotation    Knee flexion 5 5  Knee extension 5 5  Ankle dorsiflexion 4 4  Ankle plantarflexion    Ankle inversion    Ankle eversion    (* = pain; Blank rows = not tested)  Transfers: Assistive device utilized: None  Sit to stand: Complete Independence Stand to sit: Complete Independence Chair to chair: Complete Independence Floor:  Not tested  Stairs: Level of Assistance: Modified independence Stair Negotiation Technique: Step to Pattern with Bilateral Rails Number of Stairs: 4  Height of Stairs: 6"  Comments: Decreased speed with step-to pattern;  Gait: Gait pattern: step through pattern, decreased step length- Right, and decreased step length- Left Distance walked: 100' Assistive device utilized: None Level of assistance: Complete Independence Comments: Decreased self-selected gait speed  Functional Outcome Measures  01/20/23 01/22/23 Comments  BERG  42/56 Increased risk for fall, in  need of intervention  DGI     FGA     TUG 17.5 seconds  Increased risk for fall, in need of intervention  5TSTS 17.3 seconds  Increased risk for fall, in need of intervention  6 Minute Walk Test     10 Meter Gait Speed Self-selected: 17.0s = 0.59 m/s; Fastest: 12.4s = 0.81 m/s  Below threshold for full community mobility  (Blank rows = not tested)  TODAY'S TREATMENT   SUBJECTIVE: Pt reports that she is doing well today. She continues to stay active at home and complete her HEP without issue. No changes since the last therapy session and no specific questions currently.    PAIN: Chronic L shoulder pain, not related to current episode;   Ther-ex  NuStep L1-4 x 8 minutes for LE strengthening with therapist adjusting resistance and monitoring fatigue; Seated clams with manual resistance from therapist x 10 BLE; Seated adductor squeezes with manual resistance from therapist x 10 BLE; Seated LAQ with 7.5# ankle weights (AW) x 15 BLE; Heel raises 3s hold, x 10; Side stepping with 7.5# AW 12' x 6;  Standing hip strengthening with 7.5# ankle weights (AW) in // bars: Hip flexion marches x 15 BLE; HS curls x 15 BLE; Hip abduction x 15 BLE; Hip extension x 15 BLE;   Neuromuscular Re-education  Tandem gait in // bars 8' x 4; Airex balance beam side stepping 6' x 4;   Not performed: Clock stepping/lunges x multiple bouts with each leg; Double black tband resisted gait forward, backward, R lateral, and L lateral x 5 each direction;  Agility ladder side stepping 12' x 2; Squats x 10; Sit to stand with 6# overhead med ball press x 10; Sit to stand for speed x 30s (14 reps); Rockerboard balance in A/P and R/L orientation x mutiple trials in each orientation; Rockerboard anterior/posterior weight shifts in A/P orientation x 10 each direction; Airex tandem balance alternating forward LE x 30s each;  Airex balance beam tandem gait forward 6' x 4; Forward 6" and 12" hurdle stepping  obstacle course in // bars x multiple lengths;   PATIENT EDUCATION:  Education details: Pt educated throughout session about proper posture and technique with exercises. Improved exercise technique, movement at target joints, use of target muscles after min to mod verbal, visual, tactile cues. Balance exercises Person educated: Patient Education method: Explanation, Demonstration, Verbal cues Education comprehension: verbalized understanding, returned demonstration, and verbal cues required   HOME EXERCISE PROGRAM:  Access Code: [email or web address] URL: [email or web address] Date: 01/22/2023 Prepared by: Ria Comment  Exercises - Sit to Stand Without Arm Support  - 1 x daily - 7 x weekly - 2 sets - 10 reps - Standing Hip Abduction with Counter Support  - 1 x daily - 7 x weekly - 2 sets - 10 reps - 3s hold - Heel Raises with Counter Support  - 1 x daily - 7 x weekly - 2 sets - 10 reps - 3s hold - Standing March with Counter Support  - 1 x daily - 7 x weekly - 2 sets - 10 reps - 3s hold - Corner Balance Feet Together: Eyes Open With Head Turns  - 1 x daily - 7 x weekly - 3 reps - 30s hold   ASSESSMENT:  CLINICAL IMPRESSION: Patient demonstrates excellent motivation during session today. Progressed both balance and strengthe during session today with greater focus on strengthening. She continues to demonstrate improvement in strength. Plan is to continue therapy at one time per week with focus on home program until no further benefit is achieved. Pt will benefit from PT services to address deficits in strength, balance, and mobility in order to return to full function at home and decrease her risk for falls.    OBJECTIVE IMPAIRMENTS: decreased balance, difficulty walking, and decreased strength.   ACTIVITY LIMITATIONS: squatting, stairs, and transfers  PARTICIPATION LIMITATIONS: cleaning, laundry, shopping, and community activity  PERSONAL FACTORS: Age, Past/current  experiences,  Time since onset of injury/illness/exacerbation, and 3+ comorbidities: DM, HTN, and colon CA  are also affecting patient's functional outcome.   REHAB POTENTIAL: Good  CLINICAL DECISION MAKING: Unstable/unpredictable  EVALUATION COMPLEXITY: High   GOALS: Goals reviewed with patient? Yes  SHORT TERM GOALS: Target date: 02/17/2023  Pt will be independent with HEP in order to improve strength and balance in order to decrease fall risk and improve function at home. Baseline:  Goal status: PARTIALLY MET   LONG TERM GOALS: Target date: 03/17/2023  Pt will increase FOTO to at least 54 to demonstrate significant improvement in function at home related to balance  Baseline: 01/20/23: 48; 02/19/23: 49; 03/17/23: 51 Goal status: PARTIALLY MET  2.  Pt will improve BERG to at least 52/56 in order to demonstrate clinically significant improvement in balance.   Baseline: 01/20/23: To be completed; 01/22/23: 42/56; 02/19/23: 49/56; 03/17/23: 52/56; Goal status: ACHIEVED  3.  Pt will improve ABC to at least 67% in order to demonstrate clinically significant improvement in balance confidence.      Baseline: 01/20/23: To be completed; 01/22/23: 34.4%; 02/19/23: 51.3%; 03/17/23: 63.8% Goal status: PARTIALLY MET  4. Pt will decrease 5TSTS by at least 3 seconds in order to demonstrate clinically significant improvement in LE strength      Baseline: 01/20/23: 17.3s; 02/19/23: 12.4s; 03/17/23: 10.9s; Goal status: ACHIEVED  5. Pt will decrease TUG to below 14 seconds/decrease in order to demonstrate decreased fall risk.  Baseline: 01/20/23: 17.5s; 02/19/23: 12.9s; 03/17/23: 10.3s;  Goal status: ACHIEVED  PLAN: PT FREQUENCY: 2x/week  PT DURATION: 8 weeks  PLANNED INTERVENTIONS: Therapeutic exercises, Therapeutic activity, Neuromuscular re-education, Balance training, Gait training, Patient/Family education, Self Care, Joint mobilization, Joint manipulation, Vestibular training, Canalith repositioning, Orthotic/Fit training, DME  instructions, Dry Needling, Electrical stimulation, Spinal manipulation, Spinal mobilization, Cryotherapy, Moist heat, Taping, Traction, Ultrasound, Ionotophoresis 4mg /ml Dexamethasone, Manual therapy, and Re-evaluation.  PLAN FOR NEXT SESSION: progress balance/strength exercises, review/modify HEP as needed;   Lynnea Maizes PT, DPT, GCS  1:13 PM,04/01/23 [facility] Physical Therapy [address] [place], [place], [address] Phone: [phone]   Fax:  [phone]

## 2023-04-01 ENCOUNTER — Ambulatory Visit: Payer: Medicare Other

## 2023-04-01 DIAGNOSIS — R2681 Unsteadiness on feet: Secondary | ICD-10-CM | POA: Diagnosis not present

## 2023-04-08 ENCOUNTER — Ambulatory Visit: Payer: Medicare Other

## 2023-04-08 DIAGNOSIS — R2681 Unsteadiness on feet: Secondary | ICD-10-CM

## 2023-04-08 NOTE — Therapy (Signed)
OUTPATIENT PHYSICAL THERAPY BALANCE TREATMENT  Patient Name: [name] MRN: [id number] DOB:1946-09-12, 77 y.o., female Today's Date: 04/09/2023  END OF SESSION:  PT End of Session - 04/08/23 1321     Visit Number 22    Number of Visits 33    Date for PT Re-Evaluation 05/12/23    Authorization Type eval: 01/20/23    PT Start Time 1317    PT Stop Time 1400    PT Time Calculation (min) 43 min    Equipment Utilized During Treatment Gait belt    Activity Tolerance Patient tolerated treatment well    Behavior During Therapy WFL for tasks assessed/performed            Past Medical History:  Diagnosis Date   Allergy    Arthritis of knee, degenerative 05/08/2015   Basal cell carcinoma 03/2019   nose   Colon cancer (HCC) 2014   Partial colon resection, chemo + rad tx's.    Complication of anesthesia    Diabetes mellitus without complication (HCC)    Pt takes Metformin.   Hyperlipidemia    Hypertension    PONV (postoperative nausea and vomiting)    SBO (small bowel obstruction) (HCC) 04/20/2018   Seizures (HCC)    Umbilical hernia with obstruction but no gangrene    Vitamin D deficiency    Past Surgical History:  Procedure Laterality Date   CATARACT EXTRACTION, BILATERAL  01/2020   Dr. Zenaida Niece [facility]   COLOSTOMY REVERSAL  01/2014   ILEOSTOMY  05/2013   UMBILICAL HERNIA REPAIR  05/2018   Patient Active Problem List   Diagnosis Date Noted   Stage 3a chronic kidney disease (CKD) (HCC) 12/30/2022   Trigger finger, left ring finger 12/27/2021   Primary osteoarthritis of both hips 06/10/2019   Bilateral carotid artery stenosis 08/24/2018   Atherosclerosis of aorta (HCC) 08/24/2018   Type II diabetes mellitus with complication (HCC) 08/18/2018   Bradycardia 07/24/2018   Neoplasm of uncertain behavior of skin 01/16/2017   Senile ecchymosis 08/20/2015   Essential (primary) hypertension 05/08/2015   History of rectal cancer 05/08/2015   Hyperlipidemia associated with type 2  diabetes mellitus (HCC) 05/08/2015   Seizure disorder (HCC) 05/08/2015   Shoulder strain 05/08/2015   Avitaminosis D 05/08/2015   PCP: Reubin Milan, MD  REFERRING PROVIDER: Reubin Milan, MD   REFERRING DIAG: R26.9 (ICD-10-CM) - Gait disturbance   RATIONALE FOR EVALUATION AND TREATMENT: Rehabilitation  THERAPY DIAG: Unsteadiness on feet  ONSET DATE: 01/17/22 (approximate)  FOLLOW-UP APPT SCHEDULED WITH REFERRING PROVIDER: Yes (in 4 months per patient)  FROM INITIAL EVALUATION (01/20/23) SUBJECTIVE:  SUBJECTIVE STATEMENT:  Unsteadiness  PERTINENT HISTORY:  Pt complains of intermittent difficulty with her balance over the period of multiple months. She fell 6 months ago and fractured multiple ribs. She has noticed when walking across the grass she needs additional support. She uses cart when at the grocery store. She reports some urinary and bowel urge incontinence.    Pain: Yes, knee, back, and shoulder pain; Numbness/Tingling: Yes, occasional numbness in L foot and R hand. 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  Focal Weakness: No, generalized LE weakness Recent changes in overall health/medication: No Prior history of physical therapy for balance:  No Dominant hand: left Red flags: Pt reports some nighttime urinary incontinence and some daily bowel issues, history of colon cancer, occasional chills; Denies h/o spinal tumors, h/o compression fx, abdominal pain, fever, night sweats, nausea, vomiting;  PRECAUTIONS: None  WEIGHT BEARING RESTRICTIONS: No  FALLS: Has patient fallen in last 6 months? Yes. Number of falls 1  (a couple falls before that)  Living Environment Lives with: lives with their family, with daughter Lives in: House/apartment, one level Stairs: Yes: External: 3 steps; on right going up Has  following equipment at home: Environmental consultant - 2 wheeled, Shower bench, Grab bars, and handicap height commode  Prior level of function: Independent with basic ADLs, dtr provides some assist IADLs  Occupational demands: Retired, worked in Marine scientist  Hobbies: Walks, watching television, crafts;  Patient Goals: Pt would like to remain independent as long as possible  OBJECTIVE:  Patient Surveys  FOTO: 48, predicted improvement to 77;  ABC: To be completed;  Cognition Patient is oriented to person, place, and time.  Recent memory and remote memory appear slightly impaired; Attention span and concentration are intact.  Expressive speech is intact.  Patient's fund of knowledge is within normal limits for educational level.    Gross Musculoskeletal Assessment Tremor: None Bulk: Normal Tone: Normal  Posture: Forward head and rounded shoulders  LE MMT: MMT (out of 5) Right  Left   Hip flexion 4+ 4+  Hip extension    Hip abduction    Hip adduction    Hip internal rotation    Hip external rotation    Knee flexion 5 5  Knee extension 5 5  Ankle dorsiflexion 4 4  Ankle plantarflexion    Ankle inversion    Ankle eversion    (* = pain; Blank rows = not tested)  Transfers: Assistive device utilized: None  Sit to stand: Complete Independence Stand to sit: Complete Independence Chair to chair: Complete Independence Floor:  Not tested  Stairs: Level of Assistance: Modified independence Stair Negotiation Technique: Step to Pattern with Bilateral Rails Number of Stairs: 4  Height of Stairs: 6"  Comments: Decreased speed with step-to pattern;  Gait: Gait pattern: step through pattern, decreased step length- Right, and decreased step length- Left Distance walked: 100' Assistive device utilized: None Level of assistance: Complete Independence Comments: Decreased self-selected gait speed  Functional Outcome Measures  01/20/23 01/22/23 Comments  BERG  42/56 Increased risk for fall, in  need of intervention  DGI     FGA     TUG 17.5 seconds  Increased risk for fall, in need of intervention  5TSTS 17.3 seconds  Increased risk for fall, in need of intervention  6 Minute Walk Test     10 Meter Gait Speed Self-selected: 17.0s = 0.59 m/s; Fastest: 12.4s = 0.81 m/s  Below threshold for full community mobility  (Blank rows = not tested)  TODAY'S TREATMENT   SUBJECTIVE: Pt reports that she is doing alright today. She is slightly anxious about her colonoscopy on Friday. She stopped taking an oral medication she was taking for approximately a week that was prescribed by her ophthalmologist.  She continues to stay active at home and completes her HEP without issue. She reports some vertigo recently when getting out of bed and when bending over. History of vertigo in the past.    PAIN: Chronic L shoulder pain, not related to current episode;   Ther-ex  Vitals: BP: 192/55 mmHg L arm sitting, HR: 64, SpO2: 97%; 178/53 mmHg, manual recheck 174/54 mmHg;   BPPV TESTS:  Symptoms Duration Intensity Nystagmus  L Dix-Hallpike Vertigo  Mild Downbeating  R Dix-Hallpike Vertigo  Mild None  L Head Roll Vertigo  Mild L torsional nystagmus  R Head Roll None   None  L Sidelying Test      R Sidelying Test      (blank = not tested)  Extensive BPPV education;    PATIENT EDUCATION:  Education details: Pt educated throughout session about proper posture and technique with exercises. Improved exercise technique, movement at target joints, use of target muscles after min to mod verbal, visual, tactile cues. Balance exercises Person educated: Patient Education method: Explanation, Demonstration, Verbal cues Education comprehension: verbalized understanding, returned demonstration, and verbal cues required   HOME EXERCISE PROGRAM:  Access Code: [email or web address] URL: [email or web address] Date: 04/08/2023 Prepared by: Ria Comment  Exercises - Sit to Stand Without Arm Support   - 1 x daily - 7 x weekly - 2 sets - 10 reps - Standing Hip Abduction with Counter Support  - 1 x daily - 7 x weekly - 2 sets - 10 reps - 3s hold - Heel Raises with Counter Support  - 1 x daily - 7 x weekly - 2 sets - 10 reps - 3s hold - Standing March with Counter Support  - 1 x daily - 7 x weekly - 2 sets - 10 reps - 3s hold - Corner Balance Feet Together: Eyes Open With Head Turns  - 1 x daily - 7 x weekly - 3 reps - 30s hold  Patient Education - BPPV   ASSESSMENT:  CLINICAL IMPRESSION: Due to recent vertigo checked vitals at the start of session. BP is elevated so pt advised to monitor after she returns home and contact PCP if it stays elevated. Due to reports of vertigo performed BPPV testing. Testing consistent with possible L posterior canal BPPV. Extensive education with patient regarding BPPV. Plan to retest at next appointment and treat as indicated. Pt will benefit from PT services to address deficits in strength, balance, and mobility in order to return to full function at home and decrease her risk for falls.    OBJECTIVE IMPAIRMENTS: decreased balance, difficulty walking, and decreased strength.   ACTIVITY LIMITATIONS: squatting, stairs, and transfers  PARTICIPATION LIMITATIONS: cleaning, laundry, shopping, and community activity  PERSONAL FACTORS: Age, Past/current experiences, Time since onset of injury/illness/exacerbation, and 3+ comorbidities: DM, HTN, and colon CA  are also affecting patient's functional outcome.   REHAB POTENTIAL: Good  CLINICAL DECISION MAKING: Unstable/unpredictable  EVALUATION COMPLEXITY: High   GOALS: Goals reviewed with patient? Yes  SHORT TERM GOALS: Target date: 02/17/2023  Pt will be independent with HEP in order to improve strength and balance in order to decrease fall risk and improve function at home. Baseline:  Goal status: PARTIALLY MET   LONG TERM GOALS: Target date: 05/12/2023  Pt will increase FOTO to at least 54 to  demonstrate significant improvement in function at home related to balance  Baseline: 01/20/23: 48; 02/19/23: 49; 03/17/23: 51 Goal status: PARTIALLY MET  2.  Pt will improve BERG to at least 52/56 in order to demonstrate clinically significant improvement in balance.   Baseline: 01/20/23: To be completed; 01/22/23: 42/56; 02/19/23: 49/56; 03/17/23: 52/56; Goal status: ACHIEVED  3.  Pt will improve ABC to at least 67% in order to demonstrate clinically significant improvement in balance confidence.      Baseline: 01/20/23: To be completed; 01/22/23: 34.4%; 02/19/23: 51.3%; 03/17/23: 63.8% Goal status: PARTIALLY MET  4. Pt will decrease 5TSTS by at least 3 seconds in order to demonstrate clinically significant improvement in LE strength      Baseline: 01/20/23: 17.3s; 02/19/23: 12.4s; 03/17/23: 10.9s; Goal status: ACHIEVED  5. Pt will decrease TUG to below 14 seconds/decrease in order to demonstrate decreased fall risk.  Baseline: 01/20/23: 17.5s; 02/19/23: 12.9s; 03/17/23: 10.3s;  Goal status: ACHIEVED  PLAN: PT FREQUENCY: 2x/week  PT DURATION: 8 weeks  PLANNED INTERVENTIONS: Therapeutic exercises, Therapeutic activity, Neuromuscular re-education, Balance training, Gait training, Patient/Family education, Self Care, Joint mobilization, Joint manipulation, Vestibular training, Canalith repositioning, Orthotic/Fit training, DME instructions, Dry Needling, Electrical stimulation, Spinal manipulation, Spinal mobilization, Cryotherapy, Moist heat, Taping, Traction, Ultrasound, Ionotophoresis 4mg /ml Dexamethasone, Manual therapy, and Re-evaluation.  PLAN FOR NEXT SESSION: progress balance/strength exercises, review/modify HEP as needed;   Lynnea Maizes PT, DPT, GCS  4:04 PM,04/09/23 [facility] Physical Therapy [address] [place], [place], [address] Phone: [phone]   Fax:  [phone]

## 2023-04-10 DIAGNOSIS — Z794 Long term (current) use of insulin: Secondary | ICD-10-CM | POA: Diagnosis not present

## 2023-04-10 DIAGNOSIS — D123 Benign neoplasm of transverse colon: Secondary | ICD-10-CM | POA: Diagnosis not present

## 2023-04-10 DIAGNOSIS — K649 Unspecified hemorrhoids: Secondary | ICD-10-CM | POA: Diagnosis not present

## 2023-04-10 DIAGNOSIS — Z85048 Personal history of other malignant neoplasm of rectum, rectosigmoid junction, and anus: Secondary | ICD-10-CM | POA: Diagnosis not present

## 2023-04-10 DIAGNOSIS — Z86010 Personal history of colonic polyps: Secondary | ICD-10-CM | POA: Diagnosis not present

## 2023-04-10 DIAGNOSIS — K644 Residual hemorrhoidal skin tags: Secondary | ICD-10-CM | POA: Diagnosis not present

## 2023-04-10 DIAGNOSIS — Z1211 Encounter for screening for malignant neoplasm of colon: Secondary | ICD-10-CM | POA: Diagnosis not present

## 2023-04-10 DIAGNOSIS — I10 Essential (primary) hypertension: Secondary | ICD-10-CM | POA: Diagnosis not present

## 2023-04-10 DIAGNOSIS — E119 Type 2 diabetes mellitus without complications: Secondary | ICD-10-CM | POA: Diagnosis not present

## 2023-04-10 DIAGNOSIS — Z923 Personal history of irradiation: Secondary | ICD-10-CM | POA: Diagnosis not present

## 2023-04-10 DIAGNOSIS — Z01818 Encounter for other preprocedural examination: Secondary | ICD-10-CM | POA: Diagnosis not present

## 2023-04-10 DIAGNOSIS — Z9221 Personal history of antineoplastic chemotherapy: Secondary | ICD-10-CM | POA: Diagnosis not present

## 2023-04-10 DIAGNOSIS — Z7982 Long term (current) use of aspirin: Secondary | ICD-10-CM | POA: Diagnosis not present

## 2023-04-10 DIAGNOSIS — D120 Benign neoplasm of cecum: Secondary | ICD-10-CM | POA: Diagnosis not present

## 2023-04-10 DIAGNOSIS — Z79899 Other long term (current) drug therapy: Secondary | ICD-10-CM | POA: Diagnosis not present

## 2023-04-14 NOTE — Therapy (Signed)
OUTPATIENT PHYSICAL THERAPY BALANCE TREATMENT  Patient Name: [name] MRN: [id number] DOB:June 28, 1946, 77 y.o., female Today's Date: 04/15/2023  END OF SESSION:  PT End of Session - 04/15/23 1321     Visit Number 23    Number of Visits 33    Date for PT Re-Evaluation 05/12/23    Authorization Type eval: 01/20/23    PT Start Time 1322    PT Stop Time 1406    PT Time Calculation (min) 44 min    Equipment Utilized During Treatment Gait belt    Activity Tolerance Patient tolerated treatment well    Behavior During Therapy WFL for tasks assessed/performed             Past Medical History:  Diagnosis Date   Allergy    Arthritis of knee, degenerative 05/08/2015   Basal cell carcinoma 03/2019   nose   Colon cancer (HCC) 2014   Partial colon resection, chemo + rad tx's.    Complication of anesthesia    Diabetes mellitus without complication (HCC)    Pt takes Metformin.   Hyperlipidemia    Hypertension    PONV (postoperative nausea and vomiting)    SBO (small bowel obstruction) (HCC) 04/20/2018   Seizures (HCC)    Umbilical hernia with obstruction but no gangrene    Vitamin D deficiency    Past Surgical History:  Procedure Laterality Date   CATARACT EXTRACTION, BILATERAL  01/2020   Dr. Zenaida Niece [facility]   COLOSTOMY REVERSAL  01/2014   ILEOSTOMY  05/2013   UMBILICAL HERNIA REPAIR  05/2018   Patient Active Problem List   Diagnosis Date Noted   Stage 3a chronic kidney disease (CKD) (HCC) 12/30/2022   Trigger finger, left ring finger 12/27/2021   Primary osteoarthritis of both hips 06/10/2019   Bilateral carotid artery stenosis 08/24/2018   Atherosclerosis of aorta (HCC) 08/24/2018   Type II diabetes mellitus with complication (HCC) 08/18/2018   Bradycardia 07/24/2018   Neoplasm of uncertain behavior of skin 01/16/2017   Senile ecchymosis 08/20/2015   Essential (primary) hypertension 05/08/2015   History of rectal cancer 05/08/2015   Hyperlipidemia associated with type  2 diabetes mellitus (HCC) 05/08/2015   Seizure disorder (HCC) 05/08/2015   Shoulder strain 05/08/2015   Avitaminosis D 05/08/2015   PCP: Reubin Milan, MD  REFERRING PROVIDER: Reubin Milan, MD   REFERRING DIAG: R26.9 (ICD-10-CM) - Gait disturbance   RATIONALE FOR EVALUATION AND TREATMENT: Rehabilitation  THERAPY DIAG: Unsteadiness on feet  ONSET DATE: 01/17/22 (approximate)  FOLLOW-UP APPT SCHEDULED WITH REFERRING PROVIDER: Yes (in 4 months per patient)  FROM INITIAL EVALUATION (01/20/23) SUBJECTIVE:  SUBJECTIVE STATEMENT:  Unsteadiness  PERTINENT HISTORY:  Pt complains of intermittent difficulty with her balance over the period of multiple months. She fell 6 months ago and fractured multiple ribs. She has noticed when walking across the grass she needs additional support. She uses cart when at the grocery store. She reports some urinary and bowel urge incontinence.    Pain: Yes, knee, back, and shoulder pain; Numbness/Tingling: Yes, occasional numbness in L foot and R hand. 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  Focal Weakness: No, generalized LE weakness Recent changes in overall health/medication: No Prior history of physical therapy for balance:  No Dominant hand: left Red flags: Pt reports some nighttime urinary incontinence and some daily bowel issues, history of colon cancer, occasional chills; Denies h/o spinal tumors, h/o compression fx, abdominal pain, fever, night sweats, nausea, vomiting;  PRECAUTIONS: None  WEIGHT BEARING RESTRICTIONS: No  FALLS: Has patient fallen in last 6 months? Yes. Number of falls 1  (a couple falls before that)  Living Environment Lives with: lives with their family, with daughter Lives in: House/apartment, one level Stairs: Yes: External: 3 steps; on right going up Has  following equipment at home: Environmental consultant - 2 wheeled, Shower bench, Grab bars, and handicap height commode  Prior level of function: Independent with basic ADLs, dtr provides some assist IADLs  Occupational demands: Retired, worked in Marine scientist  Hobbies: Walks, watching television, crafts;  Patient Goals: Pt would like to remain independent as long as possible  OBJECTIVE:  Patient Surveys  FOTO: 48, predicted improvement to 21;  ABC: To be completed;  Cognition Patient is oriented to person, place, and time.  Recent memory and remote memory appear slightly impaired; Attention span and concentration are intact.  Expressive speech is intact.  Patient's fund of knowledge is within normal limits for educational level.    Gross Musculoskeletal Assessment Tremor: None Bulk: Normal Tone: Normal  Posture: Forward head and rounded shoulders  LE MMT: MMT (out of 5) Right  Left   Hip flexion 4+ 4+  Hip extension    Hip abduction    Hip adduction    Hip internal rotation    Hip external rotation    Knee flexion 5 5  Knee extension 5 5  Ankle dorsiflexion 4 4  Ankle plantarflexion    Ankle inversion    Ankle eversion    (* = pain; Blank rows = not tested)  Transfers: Assistive device utilized: None  Sit to stand: Complete Independence Stand to sit: Complete Independence Chair to chair: Complete Independence Floor:  Not tested  Stairs: Level of Assistance: Modified independence Stair Negotiation Technique: Step to Pattern with Bilateral Rails Number of Stairs: 4  Height of Stairs: 6"  Comments: Decreased speed with step-to pattern;  Gait: Gait pattern: step through pattern, decreased step length- Right, and decreased step length- Left Distance walked: 100' Assistive device utilized: None Level of assistance: Complete Independence Comments: Decreased self-selected gait speed  Functional Outcome Measures  01/20/23 01/22/23 Comments  BERG  42/56 Increased risk for fall, in  need of intervention  DGI     FGA     TUG 17.5 seconds  Increased risk for fall, in need of intervention  5TSTS 17.3 seconds  Increased risk for fall, in need of intervention  6 Minute Walk Test     10 Meter Gait Speed Self-selected: 17.0s = 0.59 m/s; Fastest: 12.4s = 0.81 m/s  Below threshold for full community mobility  (Blank rows = not tested)  TODAY'S TREATMENT   SUBJECTIVE: Pt reports that she is doing alright today. She had her colonoscopy last week and had some issues with elevated BP after the procedure. She spoke to her ophthalmologist and they changed the medication she was concerned about and is now taking a different med. No issues with vertigo since the last therapy session.   PAIN: Chronic L shoulder pain, not related to current episode;   Ther-ex  NuStep L1-4 x 8 minutes for LE strengthening with therapist adjusting resistance and monitoring fatigue; Resisted side stepping with green tband around ankles; Seated clams with manual resistance from therapist 2 x 10 BLE; Seated adductor squeezes with manual resistance from therapist 2 x 10 BLE; Seated marches with green tband around knees 2 x 10 BLE; Sit to stand holding 8# med ball 2 x 10;   Neuromuscular Re-education  Clock stepping/lunges x multiple bouts with each leg; Forward walking lateral 6" cone taps in // bars x multiple lengths; Forward 6" hurdle stepping in // bars x multiple lengths; Lateral 6" hurdle stepping in // bars x multiple lengths; Airex balance beam side stepping 6' x 4; Airex balance beam tandem balance alternating forward LE 2 x 30s on each side;   Not performed: Double black tband resisted gait forward, backward, R lateral, and L lateral x 5 each direction;  Squats x 10; Rockerboard balance in A/P and R/L orientation x mutiple trials in each orientation; Rockerboard anterior/posterior weight shifts in A/P orientation x 10 each direction; Airex tandem balance alternating forward LE x  30s each;  Airex balance beam tandem gait forward 6' x 4; Seated LAQ with 7.5# ankle weights (AW) x 15 BLE; Heel raises 3s hold, x 10; Standing hip strengthening with 7.5# ankle weights (AW) in // bars: Hip flexion marches x 15 BLE; HS curls x 15 BLE; Hip abduction x 15 BLE; Hip extension x 15 BLE;   PATIENT EDUCATION:  Education details: Pt educated throughout session about proper posture and technique with exercises. Improved exercise technique, movement at target joints, use of target muscles after min to mod verbal, visual, tactile cues. Balance exercises Person educated: Patient Education method: Explanation, Demonstration, Verbal cues Education comprehension: verbalized understanding, returned demonstration, and verbal cues required   HOME EXERCISE PROGRAM:  Access Code: [email or web address] URL: [email or web address] Date: 04/08/2023 Prepared by: Ria Comment  Exercises - Sit to Stand Without Arm Support  - 1 x daily - 7 x weekly - 2 sets - 10 reps - Standing Hip Abduction with Counter Support  - 1 x daily - 7 x weekly - 2 sets - 10 reps - 3s hold - Heel Raises with Counter Support  - 1 x daily - 7 x weekly - 2 sets - 10 reps - 3s hold - Standing March with Counter Support  - 1 x daily - 7 x weekly - 2 sets - 10 reps - 3s hold - Oncologist Feet Together: Eyes Open With Head Turns  - 1 x daily - 7 x weekly - 3 reps - 30s hold  Patient Education - BPPV   ASSESSMENT:  CLINICAL IMPRESSION: Patient demonstrates excellent motivation during session today. Deferred additional BPPV testing today as pt has not had any further bouts of vertigo. Progressed both balance and strength exercises during session today. Plan is to continue therapy at one time per week with focus on home program until no further benefit is achieved. Pt will benefit from PT services to address deficits in strength, balance, and mobility in  order to return to full function at home and decrease her risk  for falls.    OBJECTIVE IMPAIRMENTS: decreased balance, difficulty walking, and decreased strength.   ACTIVITY LIMITATIONS: squatting, stairs, and transfers  PARTICIPATION LIMITATIONS: cleaning, laundry, shopping, and community activity  PERSONAL FACTORS: Age, Past/current experiences, Time since onset of injury/illness/exacerbation, and 3+ comorbidities: DM, HTN, and colon CA  are also affecting patient's functional outcome.   REHAB POTENTIAL: Good  CLINICAL DECISION MAKING: Unstable/unpredictable  EVALUATION COMPLEXITY: High   GOALS: Goals reviewed with patient? Yes  SHORT TERM GOALS: Target date: 02/17/2023  Pt will be independent with HEP in order to improve strength and balance in order to decrease fall risk and improve function at home. Baseline:  Goal status: PARTIALLY MET   LONG TERM GOALS: Target date: 05/12/2023  Pt will increase FOTO to at least 54 to demonstrate significant improvement in function at home related to balance  Baseline: 01/20/23: 48; 02/19/23: 49; 03/17/23: 51 Goal status: PARTIALLY MET  2.  Pt will improve BERG to at least 52/56 in order to demonstrate clinically significant improvement in balance.   Baseline: 01/20/23: To be completed; 01/22/23: 42/56; 02/19/23: 49/56; 03/17/23: 52/56; Goal status: ACHIEVED  3.  Pt will improve ABC to at least 67% in order to demonstrate clinically significant improvement in balance confidence.      Baseline: 01/20/23: To be completed; 01/22/23: 34.4%; 02/19/23: 51.3%; 03/17/23: 63.8% Goal status: PARTIALLY MET  4. Pt will decrease 5TSTS by at least 3 seconds in order to demonstrate clinically significant improvement in LE strength      Baseline: 01/20/23: 17.3s; 02/19/23: 12.4s; 03/17/23: 10.9s; Goal status: ACHIEVED  5. Pt will decrease TUG to below 14 seconds/decrease in order to demonstrate decreased fall risk.  Baseline: 01/20/23: 17.5s; 02/19/23: 12.9s; 03/17/23: 10.3s;  Goal status: ACHIEVED  PLAN: PT FREQUENCY: 2x/week  PT  DURATION: 8 weeks  PLANNED INTERVENTIONS: Therapeutic exercises, Therapeutic activity, Neuromuscular re-education, Balance training, Gait training, Patient/Family education, Self Care, Joint mobilization, Joint manipulation, Vestibular training, Canalith repositioning, Orthotic/Fit training, DME instructions, Dry Needling, Electrical stimulation, Spinal manipulation, Spinal mobilization, Cryotherapy, Moist heat, Taping, Traction, Ultrasound, Ionotophoresis 4mg /ml Dexamethasone, Manual therapy, and Re-evaluation.  PLAN FOR NEXT SESSION: progress balance/strength exercises, review/modify HEP as needed;   Lynnea Maizes PT, DPT, GCS  2:27 PM,04/15/23 [facility] Physical Therapy [address] [place], [place], [address] Phone: [phone]   Fax:  [phone]

## 2023-04-15 ENCOUNTER — Ambulatory Visit: Payer: Medicare Other

## 2023-04-15 DIAGNOSIS — R2681 Unsteadiness on feet: Secondary | ICD-10-CM

## 2023-04-19 NOTE — Therapy (Signed)
OUTPATIENT PHYSICAL THERAPY BALANCE TREATMENT  Patient Name: [name] MRN: [id number] DOB:Aug 16, 1946, 77 y.o., female Today's Date: 04/20/2023  END OF SESSION:  PT End of Session - 04/20/23 1115     Visit Number 24    Number of Visits 33    Date for PT Re-Evaluation 05/12/23    Authorization Type eval: 01/20/23    PT Start Time 1110    PT Stop Time 1150    PT Time Calculation (min) 40 min    Equipment Utilized During Treatment Gait belt    Activity Tolerance Patient tolerated treatment well    Behavior During Therapy WFL for tasks assessed/performed            Past Medical History:  Diagnosis Date   Allergy    Arthritis of knee, degenerative 05/08/2015   Basal cell carcinoma 03/2019   nose   Colon cancer (HCC) 2014   Partial colon resection, chemo + rad tx's.    Complication of anesthesia    Diabetes mellitus without complication (HCC)    Pt takes Metformin.   Hyperlipidemia    Hypertension    PONV (postoperative nausea and vomiting)    SBO (small bowel obstruction) (HCC) 04/20/2018   Seizures (HCC)    Umbilical hernia with obstruction but no gangrene    Vitamin D deficiency    Past Surgical History:  Procedure Laterality Date   CATARACT EXTRACTION, BILATERAL  01/2020   Dr. Zenaida Niece [facility]   COLOSTOMY REVERSAL  01/2014   ILEOSTOMY  05/2013   UMBILICAL HERNIA REPAIR  05/2018   Patient Active Problem List   Diagnosis Date Noted   Stage 3a chronic kidney disease (CKD) (HCC) 12/30/2022   Trigger finger, left ring finger 12/27/2021   Primary osteoarthritis of both hips 06/10/2019   Bilateral carotid artery stenosis 08/24/2018   Atherosclerosis of aorta (HCC) 08/24/2018   Type II diabetes mellitus with complication (HCC) 08/18/2018   Bradycardia 07/24/2018   Neoplasm of uncertain behavior of skin 01/16/2017   Senile ecchymosis 08/20/2015   Essential (primary) hypertension 05/08/2015   History of rectal cancer 05/08/2015   Hyperlipidemia associated with type 2  diabetes mellitus (HCC) 05/08/2015   Seizure disorder (HCC) 05/08/2015   Shoulder strain 05/08/2015   Avitaminosis D 05/08/2015   PCP: Reubin Milan, MD  REFERRING PROVIDER: Reubin Milan, MD   REFERRING DIAG: R26.9 (ICD-10-CM) - Gait disturbance   RATIONALE FOR EVALUATION AND TREATMENT: Rehabilitation  THERAPY DIAG: Unsteadiness on feet  ONSET DATE: 01/17/22 (approximate)  FOLLOW-UP APPT SCHEDULED WITH REFERRING PROVIDER: Yes (in 4 months per patient)  FROM INITIAL EVALUATION (01/20/23) SUBJECTIVE:  SUBJECTIVE STATEMENT:  Unsteadiness  PERTINENT HISTORY:  Pt complains of intermittent difficulty with her balance over the period of multiple months. She fell 6 months ago and fractured multiple ribs. She has noticed when walking across the grass she needs additional support. She uses cart when at the grocery store. She reports some urinary and bowel urge incontinence.    Pain: Yes, knee, back, and shoulder pain; Numbness/Tingling: Yes, occasional numbness in L foot and R hand. 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  Focal Weakness: No, generalized LE weakness Recent changes in overall health/medication: No Prior history of physical therapy for balance:  No Dominant hand: left Red flags: Pt reports some nighttime urinary incontinence and some daily bowel issues, history of colon cancer, occasional chills; Denies h/o spinal tumors, h/o compression fx, abdominal pain, fever, night sweats, nausea, vomiting;  PRECAUTIONS: None  WEIGHT BEARING RESTRICTIONS: No  FALLS: Has patient fallen in last 6 months? Yes. Number of falls 1  (a couple falls before that)  Living Environment Lives with: lives with their family, with daughter Lives in: House/apartment, one level Stairs: Yes: External: 3 steps; on right going up Has  following equipment at home: Environmental consultant - 2 wheeled, Shower bench, Grab bars, and handicap height commode  Prior level of function: Independent with basic ADLs, dtr provides some assist IADLs  Occupational demands: Retired, worked in [employer] scientist  Hobbies: Walks, watching television, crafts;  Patient Goals: Pt would like to remain independent as long as possible  OBJECTIVE:  Patient Surveys  FOTO: 48, predicted improvement to 69;  ABC: To be completed;  Cognition Patient is oriented to person, place, and time.  Recent memory and remote memory appear slightly impaired; Attention span and concentration are intact.  Expressive speech is intact.  Patient's fund of knowledge is within normal limits for educational level.    Gross Musculoskeletal Assessment Tremor: None Bulk: Normal Tone: Normal  Posture: Forward head and rounded shoulders  LE MMT: MMT (out of 5) Right  Left   Hip flexion 4+ 4+  Hip extension    Hip abduction    Hip adduction    Hip internal rotation    Hip external rotation    Knee flexion 5 5  Knee extension 5 5  Ankle dorsiflexion 4 4  Ankle plantarflexion    Ankle inversion    Ankle eversion    (* = pain; Blank rows = not tested)  Transfers: Assistive device utilized: None  Sit to stand: Complete Independence Stand to sit: Complete Independence Chair to chair: Complete Independence Floor:  Not tested  Stairs: Level of Assistance: Modified independence Stair Negotiation Technique: Step to Pattern with Bilateral Rails Number of Stairs: 4  Height of Stairs: 6"  Comments: Decreased speed with step-to pattern;  Gait: Gait pattern: step through pattern, decreased step length- Right, and decreased step length- Left Distance walked: 100' Assistive device utilized: None Level of assistance: Complete Independence Comments: Decreased self-selected gait speed  Functional Outcome Measures  01/20/23 01/22/23 Comments  BERG  42/56 Increased risk for fall, in  need of intervention  DGI     FGA     TUG 17.5 seconds  Increased risk for fall, in need of intervention  5TSTS 17.3 seconds  Increased risk for fall, in need of intervention  6 Minute Walk Test     10 Meter Gait Speed Self-selected: 17.0s = 0.59 m/s; Fastest: 12.4s = 0.81 m/s  Below threshold for full community mobility  (Blank rows = not tested)  TODAY'S TREATMENT   SUBJECTIVE: Pt reports that she is doing alright today. No issues with vertigo since the last therapy session. No recent falls. No specific questions or concerns currently.    PAIN: Intermittent L shoulder pain, not currently painful and unrelated to current episode;   Neuromuscular Re-education  SciFit L4 x 7 minutes for LE strengthening and warm-up during interval history (2 minutes unbilled); Forward/backward gait outside in grass x 70' each; Forward gait outside in grass with eyes closed 2 x 70'; Forward gait outside in grass with horizontal and vertical head turns x 70' each; Side stepping outside in grass x 30' each direction; Cross-over side stepping outside in grass x 30' each direction; 6" lateral cone taps during forward gait x multiple trials; Tandem gait 12' x 2; Rockerboard static balance in A/P orientation; Rockerboard anterior/posterior weight shifts in A/P orientation x 10 each direction;   Not performed: Double black tband resisted gait forward, backward, R lateral, and L lateral x 5 each direction;  Squats x 10; Airex tandem balance alternating forward LE x 30s each;  Airex balance beam tandem gait forward 6' x 4; Seated LAQ with 7.5# ankle weights (AW) x 15 BLE; Heel raises 3s hold, x 10; Standing hip strengthening with 7.5# ankle weights (AW) in // bars: Hip flexion marches x 15 BLE; HS curls x 15 BLE; Hip abduction x 15 BLE; Hip extension x 15 BLE; Seated clams with manual resistance from therapist 2 x 10 BLE; Seated adductor squeezes with manual resistance from therapist 2 x 10  BLE; Sit to stand holding 8# med ball 2 x 10; Clock stepping/lunges x multiple bouts with each leg; Forward 6" hurdle stepping in // bars x multiple lengths; Lateral 6" hurdle stepping in // bars x multiple lengths;   PATIENT EDUCATION:  Education details: Balance exercises Person educated: Patient Education method: Explanation, Demonstration, Verbal cues Education comprehension: verbalized understanding, returned demonstration, and verbal cues required   HOME EXERCISE PROGRAM:  Access Code: [email or web address] URL: [email or web address] Date: 04/08/2023 Prepared by: Ria Comment  Exercises - Sit to Stand Without Arm Support  - 1 x daily - 7 x weekly - 2 sets - 10 reps - Standing Hip Abduction with Counter Support  - 1 x daily - 7 x weekly - 2 sets - 10 reps - 3s hold - Heel Raises with Counter Support  - 1 x daily - 7 x weekly - 2 sets - 10 reps - 3s hold - Standing March with Counter Support  - 1 x daily - 7 x weekly - 2 sets - 10 reps - 3s hold - Oncologist Feet Together: Eyes Open With Head Turns  - 1 x daily - 7 x weekly - 3 reps - 30s hold  Patient Education - BPPV   ASSESSMENT:  CLINICAL IMPRESSION: Patient demonstrates excellent motivation during session today. Deferred additional BPPV testing today as pt has not had any further bouts of vertigo. Progressed balance exercises with the majority of the time spent outside on the grass to challenge pt on uneven surface. Plan is to continue therapy at one time per week with focus on home program until no further benefit is achieved. Pt will benefit from PT services to address deficits in strength, balance, and mobility in order to return to full function at home and decrease her risk for falls.    OBJECTIVE IMPAIRMENTS: decreased balance, difficulty walking, and decreased strength.   ACTIVITY LIMITATIONS: squatting, stairs, and transfers  PARTICIPATION LIMITATIONS: cleaning, laundry,  shopping, and community  activity  PERSONAL FACTORS: Age, Past/current experiences, Time since onset of injury/illness/exacerbation, and 3+ comorbidities: DM, HTN, and colon CA  are also affecting patient's functional outcome.   REHAB POTENTIAL: Good  CLINICAL DECISION MAKING: Unstable/unpredictable  EVALUATION COMPLEXITY: High   GOALS: Goals reviewed with patient? Yes  SHORT TERM GOALS: Target date: 02/17/2023  Pt will be independent with HEP in order to improve strength and balance in order to decrease fall risk and improve function at home. Baseline:  Goal status: PARTIALLY MET   LONG TERM GOALS: Target date: 05/12/2023  Pt will increase FOTO to at least 54 to demonstrate significant improvement in function at home related to balance  Baseline: 01/20/23: 48; 02/19/23: 49; 03/17/23: 51 Goal status: PARTIALLY MET  2.  Pt will improve BERG to at least 52/56 in order to demonstrate clinically significant improvement in balance.   Baseline: 01/20/23: To be completed; 01/22/23: 42/56; 02/19/23: 49/56; 03/17/23: 52/56; Goal status: ACHIEVED  3.  Pt will improve ABC to at least 67% in order to demonstrate clinically significant improvement in balance confidence.      Baseline: 01/20/23: To be completed; 01/22/23: 34.4%; 02/19/23: 51.3%; 03/17/23: 63.8% Goal status: PARTIALLY MET  4. Pt will decrease 5TSTS by at least 3 seconds in order to demonstrate clinically significant improvement in LE strength      Baseline: 01/20/23: 17.3s; 02/19/23: 12.4s; 03/17/23: 10.9s; Goal status: ACHIEVED  5. Pt will decrease TUG to below 14 seconds/decrease in order to demonstrate decreased fall risk.  Baseline: 01/20/23: 17.5s; 02/19/23: 12.9s; 03/17/23: 10.3s;  Goal status: ACHIEVED  PLAN: PT FREQUENCY: 2x/week  PT DURATION: 8 weeks  PLANNED INTERVENTIONS: Therapeutic exercises, Therapeutic activity, Neuromuscular re-education, Balance training, Gait training, Patient/Family education, Self Care, Joint mobilization, Joint manipulation, Vestibular  training, Canalith repositioning, Orthotic/Fit training, DME instructions, Dry Needling, Electrical stimulation, Spinal manipulation, Spinal mobilization, Cryotherapy, Moist heat, Taping, Traction, Ultrasound, Ionotophoresis 4mg /ml Dexamethasone, Manual therapy, and Re-evaluation.  PLAN FOR NEXT SESSION: progress balance/strength exercises, review/modify HEP as needed;   Lynnea Maizes PT, DPT, GCS  9:41 PM,04/20/23 [facility] Physical Therapy [address] [place], [place], [address] Phone: [phone]   Fax:  [phone]

## 2023-04-20 ENCOUNTER — Ambulatory Visit: Payer: Medicare Other | Attending: Internal Medicine

## 2023-04-20 DIAGNOSIS — R2681 Unsteadiness on feet: Secondary | ICD-10-CM | POA: Insufficient documentation

## 2023-04-27 DIAGNOSIS — Z1231 Encounter for screening mammogram for malignant neoplasm of breast: Secondary | ICD-10-CM | POA: Diagnosis not present

## 2023-04-27 DIAGNOSIS — R92313 Mammographic fatty tissue density, bilateral breasts: Secondary | ICD-10-CM | POA: Diagnosis not present

## 2023-04-28 NOTE — Therapy (Signed)
OUTPATIENT PHYSICAL THERAPY BALANCE TREATMENT/DISCHARGE  Patient Name: [name] MRN: [id number] DOB:[birth year], 77 y.o., female Today's Date: 04/30/2023  END OF SESSION:  PT End of Session - 04/29/23 1314     Visit Number 25    Number of Visits 33    Date for PT Re-Evaluation 05/12/23    Authorization Type eval: 01/20/23    PT Start Time 1315    PT Stop Time 1400    PT Time Calculation (min) 45 min    Equipment Utilized During Treatment Gait belt    Activity Tolerance Patient tolerated treatment well    Behavior During Therapy WFL for tasks assessed/performed            Past Medical History:  Diagnosis Date   Allergy    Arthritis of knee, degenerative 05/08/2015   Basal cell carcinoma 03/2019   nose   Colon cancer (HCC) 2014   Partial colon resection, chemo + rad tx's.    Complication of anesthesia    Diabetes mellitus without complication (HCC)    Pt takes Metformin.   Hyperlipidemia    Hypertension    PONV (postoperative nausea and vomiting)    SBO (small bowel obstruction) (HCC) 04/20/2018   Seizures (HCC)    Umbilical hernia with obstruction but no gangrene    Vitamin D deficiency    Past Surgical History:  Procedure Laterality Date   CATARACT EXTRACTION, BILATERAL  01/2020   Dr. Zenaida Niece [facility]   COLOSTOMY REVERSAL  01/2014   ILEOSTOMY  05/2013   UMBILICAL HERNIA REPAIR  05/2018   Patient Active Problem List   Diagnosis Date Noted   Stage 3a chronic kidney disease (CKD) (HCC) 12/30/2022   Trigger finger, left ring finger 12/27/2021   Primary osteoarthritis of both hips 06/10/2019   Bilateral carotid artery stenosis 08/24/2018   Atherosclerosis of aorta (HCC) 08/24/2018   Type II diabetes mellitus with complication (HCC) 08/18/2018   Bradycardia 07/24/2018   Neoplasm of uncertain behavior of skin 01/16/2017   Senile ecchymosis 08/20/2015   Essential (primary) hypertension 05/08/2015   History of rectal cancer 05/08/2015   Hyperlipidemia associated  with type 2 diabetes mellitus (HCC) 05/08/2015   Seizure disorder (HCC) 05/08/2015   Shoulder strain 05/08/2015   Avitaminosis D 05/08/2015   PCP: Reubin Milan, MD  REFERRING PROVIDER: Reubin Milan, MD   REFERRING DIAG: R26.9 (ICD-10-CM) - Gait disturbance   RATIONALE FOR EVALUATION AND TREATMENT: Rehabilitation  THERAPY DIAG: Unsteadiness on feet  ONSET DATE: 01/17/22 (approximate)  FOLLOW-UP APPT SCHEDULED WITH REFERRING PROVIDER: Yes (in 4 months per patient)  FROM INITIAL EVALUATION (01/20/23) SUBJECTIVE:  SUBJECTIVE STATEMENT:  Unsteadiness  PERTINENT HISTORY:  Pt complains of intermittent difficulty with her balance over the period of multiple months. She fell 6 months ago and fractured multiple ribs. She has noticed when walking across the grass she needs additional support. She uses cart when at the grocery store. She reports some urinary and bowel urge incontinence.    Pain: Yes, knee, back, and shoulder pain; Numbness/Tingling: Yes, occasional numbness in L foot and R hand. 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  Focal Weakness: No, generalized LE weakness Recent changes in overall health/medication: No Prior history of physical therapy for balance:  No Dominant hand: left Red flags: Pt reports some nighttime urinary incontinence and some daily bowel issues, history of colon cancer, occasional chills; Denies h/o spinal tumors, h/o compression fx, abdominal pain, fever, night sweats, nausea, vomiting;  PRECAUTIONS: None  WEIGHT BEARING RESTRICTIONS: No  FALLS: Has patient fallen in last 6 months? Yes. Number of falls 1  (a couple falls before that)  Living Environment Lives with: lives with their family, with daughter Lives in: House/apartment, one level Stairs: Yes: External: 3 steps; on right  going up Has following equipment at home: Environmental consultant - 2 wheeled, Shower bench, Grab bars, and handicap height commode  Prior level of function: Independent with basic ADLs, dtr provides some assist IADLs  Occupational demands: Retired, worked in Marine scientist  Hobbies: Walks, watching television, crafts;  Patient Goals: Pt would like to remain independent as long as possible  OBJECTIVE:  Patient Surveys  FOTO: 48, predicted improvement to 65;  ABC: To be completed;  Cognition Patient is oriented to person, place, and time.  Recent memory and remote memory appear slightly impaired; Attention span and concentration are intact.  Expressive speech is intact.  Patient's fund of knowledge is within normal limits for educational level.    Gross Musculoskeletal Assessment Tremor: None Bulk: Normal Tone: Normal  Posture: Forward head and rounded shoulders  LE MMT: MMT (out of 5) Right  Left   Hip flexion 4+ 4+  Hip extension    Hip abduction    Hip adduction    Hip internal rotation    Hip external rotation    Knee flexion 5 5  Knee extension 5 5  Ankle dorsiflexion 4 4  Ankle plantarflexion    Ankle inversion    Ankle eversion    (* = pain; Blank rows = not tested)  Transfers: Assistive device utilized: None  Sit to stand: Complete Independence Stand to sit: Complete Independence Chair to chair: Complete Independence Floor:  Not tested  Stairs: Level of Assistance: Modified independence Stair Negotiation Technique: Step to Pattern with Bilateral Rails Number of Stairs: 4  Height of Stairs: 6"  Comments: Decreased speed with step-to pattern;  Gait: Gait pattern: step through pattern, decreased step length- Right, and decreased step length- Left Distance walked: 100' Assistive device utilized: None Level of assistance: Complete Independence Comments: Decreased self-selected gait speed  Functional Outcome Measures  01/20/23 01/22/23 Comments  BERG  42/56 Increased risk  for fall, in need of intervention  DGI     FGA     TUG 17.5 seconds  Increased risk for fall, in need of intervention  5TSTS 17.3 seconds  Increased risk for fall, in need of intervention  6 Minute Walk Test     10 Meter Gait Speed Self-selected: 17.0s = 0.59 m/s; Fastest: 12.4s = 0.81 m/s  Below threshold for full community mobility  (Blank rows = not tested)  TODAY'S TREATMENT   SUBJECTIVE: Pt reports that she is doing alright today. No recent falls. No specific questions or concerns currently. She is ready to discharge today.    PAIN: Low back pain   Neuromuscular Re-education  Updated outcome measures/goals with patient (see goal section);   Ther-ex  SciFit L2 x 5 minutes for LE strengthening and warm-up during interval history (2 minutes unbilled); Reviewed and updated HEP with patient; Sit to stand x 10; Standing hip abduction x 10 BLE; Standing heel raises x 10 BLE;   PATIENT EDUCATION:  Education details: Balance exercises, outcome measures, discharge; Person educated: Patient Education method: Explanation, Demonstration, Verbal cues Education comprehension: verbalized understanding, returned demonstration, and verbal cues required   HOME EXERCISE PROGRAM:  Access Code: [email or web address] URL: [email or web address] Date: 04/30/2023 Prepared by: Ria Comment  Exercises - Sit to Stand Without Arm Support  - 1 x daily - 7 x weekly - 2 sets - 10 reps - Standing Hip Abduction with Counter Support  - 1 x daily - 7 x weekly - 2 sets - 10 reps - 3s hold - Heel Raises with Counter Support  - 1 x daily - 7 x weekly - 2 sets - 10 reps - 3s hold - Standing March with Counter Support  - 1 x daily - 7 x weekly - 2 sets - 10 reps - 3s hold - Standing Tandem Balance with Counter Support  - 1 x daily - 7 x weekly - 2 x 30s with each foot forward hold - Alternating Single Leg Balance - Foot Behind  - 1 x daily - 7 x weekly - 2 x 30s on each leg hold - Corner Balance  Feet Together: Eyes Open With Head Turns  - 1 x daily - 7 x weekly - 3 reps - 30s hold  Patient Education - BPPV   ASSESSMENT:  CLINICAL IMPRESSION: Patient demonstrates excellent motivation during session today. Updated outcome measures/goals with patient. Her FOTO improved to 51 and her ABC increased from 34.4% to 61.3%. Patient's balance improved with her BERG increasing from 42/56 at the initial evaluation to 53/56 today. Her TUG also improved from 17.5s to 9.2s and her 5TSTS improved from 17.3s to 9.9s.  Outcome measures are grossly the same as they were when they were last updated and it appears that patient has reached a plateau in her progress.  Progressed and reviewed HEP during session today.  Patient encouraged to continue her home program and follow-up if she notices a decline in her function.  Patient will be discharged on this date.  OBJECTIVE IMPAIRMENTS: decreased balance, difficulty walking, and decreased strength.   ACTIVITY LIMITATIONS: squatting, stairs, and transfers  PARTICIPATION LIMITATIONS: cleaning, laundry, shopping, and community activity  PERSONAL FACTORS: Age, Past/current experiences, Time since onset of injury/illness/exacerbation, and 3+ comorbidities: DM, HTN, and colon CA  are also affecting patient's functional outcome.   REHAB POTENTIAL: Good  CLINICAL DECISION MAKING: Unstable/unpredictable  EVALUATION COMPLEXITY: High   GOALS: Goals reviewed with patient? Yes  SHORT TERM GOALS: Target date: 02/17/2023  Pt will be independent with HEP in order to improve strength and balance in order to decrease fall risk and improve function at home. Baseline:  Goal status: PARTIALLY MET   LONG TERM GOALS: Target date: 05/12/2023  Pt will increase FOTO to at least 54 to demonstrate significant improvement in function at home related to balance  Baseline: 01/20/23: 48; 02/19/23: 49; 03/17/23: 51; 04/29/23: 51 Goal status: PARTIALLY MET  2.  Pt  will improve BERG  to at least 52/56 in order to demonstrate clinically significant improvement in balance.   Baseline: 01/20/23: To be completed; 01/22/23: 42/56; 02/19/23: 16/10; 03/17/23: 52/56; 04/29/23: 53/56; Goal status: ACHIEVED  3.  Pt will improve ABC to at least 67% in order to demonstrate clinically significant improvement in balance confidence.      Baseline: 01/20/23: To be completed; 01/22/23: 34.4%; 02/19/23: 51.3%; 03/17/23: 63.8%; 04/29/23: 61.3% Goal status: PARTIALLY MET  4. Pt will decrease 5TSTS by at least 3 seconds in order to demonstrate clinically significant improvement in LE strength      Baseline: 01/20/23: 17.3s; 02/19/23: 12.4s; 03/17/23: 10.9s; 04/29/23: 9.9s; Goal status: ACHIEVED  5. Pt will decrease TUG to below 14 seconds/decrease in order to demonstrate decreased fall risk.  Baseline: 01/20/23: 17.5s; 02/19/23: 12.9s; 03/17/23: 10.3s; 04/29/23: 9.2s Goal status: ACHIEVED  PLAN: PT FREQUENCY: 2x/week  PT DURATION: 8 weeks  PLANNED INTERVENTIONS: Therapeutic exercises, Therapeutic activity, Neuromuscular re-education, Balance training, Gait training, Patient/Family education, Self Care, Joint mobilization, Joint manipulation, Vestibular training, Canalith repositioning, Orthotic/Fit training, DME instructions, Dry Needling, Electrical stimulation, Spinal manipulation, Spinal mobilization, Cryotherapy, Moist heat, Taping, Traction, Ultrasound, Ionotophoresis 4mg /ml Dexamethasone, Manual therapy, and Re-evaluation.  PLAN FOR NEXT SESSION: Discharge   Lynnea Maizes PT, DPT, [place]  12:57 PM,04/30/23 [facility] Physical Therapy [address] [place], [place], [address] Phone: [phone]   Fax:  [phone]

## 2023-04-29 ENCOUNTER — Ambulatory Visit: Payer: Medicare Other

## 2023-04-29 DIAGNOSIS — R2681 Unsteadiness on feet: Secondary | ICD-10-CM | POA: Diagnosis not present

## 2023-05-04 DIAGNOSIS — H0288B Meibomian gland dysfunction left eye, upper and lower eyelids: Secondary | ICD-10-CM | POA: Diagnosis not present

## 2023-05-04 DIAGNOSIS — H16223 Keratoconjunctivitis sicca, not specified as Sjogren's, bilateral: Secondary | ICD-10-CM | POA: Diagnosis not present

## 2023-05-04 DIAGNOSIS — L718 Other rosacea: Secondary | ICD-10-CM | POA: Diagnosis not present

## 2023-05-04 DIAGNOSIS — H0288A Meibomian gland dysfunction right eye, upper and lower eyelids: Secondary | ICD-10-CM | POA: Diagnosis not present

## 2023-05-05 ENCOUNTER — Ambulatory Visit (INDEPENDENT_AMBULATORY_CARE_PROVIDER_SITE_OTHER): Payer: Medicare Other | Admitting: Internal Medicine

## 2023-05-05 ENCOUNTER — Encounter: Payer: Self-pay | Admitting: Internal Medicine

## 2023-05-05 VITALS — BP 128/70 | HR 72 | Ht 62.0 in | Wt 156.0 lb

## 2023-05-05 DIAGNOSIS — R269 Unspecified abnormalities of gait and mobility: Secondary | ICD-10-CM | POA: Diagnosis not present

## 2023-05-05 DIAGNOSIS — E118 Type 2 diabetes mellitus with unspecified complications: Secondary | ICD-10-CM

## 2023-05-05 DIAGNOSIS — E119 Type 2 diabetes mellitus without complications: Secondary | ICD-10-CM

## 2023-05-05 DIAGNOSIS — Z794 Long term (current) use of insulin: Secondary | ICD-10-CM | POA: Diagnosis not present

## 2023-05-05 LAB — POCT GLYCOSYLATED HEMOGLOBIN (HGB A1C): Hemoglobin A1C: 8 % — AB (ref 4.0–5.6)

## 2023-05-05 MED ORDER — ONETOUCH ULTRA VI STRP: ORAL_STRIP | 3 refills | Status: DC

## 2023-05-05 NOTE — Progress Notes (Signed)
Dm treat   Date:  05/05/2023   Name:  [name]   DOB:  29-Jan-1946   MRN:  [id number]   Chief Complaint: Hypertension and Diabetes  Hypertension Pertinent negatives include no chest pain, headaches, palpitations or shortness of breath.  Diabetes Pertinent negatives for hypoglycemia include no headaches or tremors. Pertinent negatives for diabetes include no chest pain, no fatigue, no polydipsia and no polyuria.    Lab Results  Component Value Date   NA 142 12/30/2022   K 4.9 12/30/2022   CO2 24 12/30/2022   GLUCOSE 83 12/30/2022   BUN 15 12/30/2022   CREATININE 1.12 (H) 12/30/2022   CALCIUM 10.0 12/30/2022   EGFR 51 (L) 12/30/2022   GFRNONAA >60 04/23/2021   Lab Results  Component Value Date   CHOL 203 (H) 12/30/2022   HDL 94 12/30/2022   LDLCALC 94 12/30/2022   TRIG 88 12/30/2022   CHOLHDL 2.2 12/30/2022   Lab Results  Component Value Date   TSH 1.660 12/27/2021   Lab Results  Component Value Date   HGBA1C 8.8 (H) 12/30/2022   Lab Results  Component Value Date   WBC 7.8 12/30/2022   HGB 13.5 12/30/2022   HCT 41.0 12/30/2022   MCV 92 12/30/2022   PLT 235 12/30/2022   Lab Results  Component Value Date   ALT 11 12/30/2022   AST 14 12/30/2022   ALKPHOS 163 (H) 12/30/2022   BILITOT 0.4 12/30/2022   Lab Results  Component Value Date   VD25OH 35.3 12/25/2020     Review of Systems  Constitutional:  Negative for appetite change, fatigue, fever and unexpected weight change.  HENT:  Negative for tinnitus and trouble swallowing.   Eyes:  Negative for visual disturbance.  Respiratory:  Negative for cough, chest tightness and shortness of breath.   Cardiovascular:  Negative for chest pain, palpitations and leg swelling.  Gastrointestinal:  Negative for abdominal pain.  Endocrine: Negative for polydipsia and polyuria.  Genitourinary:  Negative for dysuria and hematuria.  Musculoskeletal:  Negative for arthralgias.  Neurological:  Negative for tremors,  numbness and headaches.  Psychiatric/Behavioral:  Negative for dysphoric mood.     Patient Active Problem List   Diagnosis Date Noted   Gait disturbance 05/05/2023   Stage 3a chronic kidney disease (CKD) (HCC) 12/30/2022   Trigger finger, left ring finger 12/27/2021   Primary osteoarthritis of both hips 06/10/2019   Bilateral carotid artery stenosis 08/24/2018   Atherosclerosis of aorta (HCC) 08/24/2018   Type II diabetes mellitus with complication (HCC) 08/18/2018   Bradycardia 07/24/2018   Neoplasm of uncertain behavior of skin 01/16/2017   Senile ecchymosis 08/20/2015   Essential (primary) hypertension 05/08/2015   History of rectal cancer 05/08/2015   Hyperlipidemia associated with type 2 diabetes mellitus (HCC) 05/08/2015   Seizure disorder (HCC) 05/08/2015   Shoulder strain 05/08/2015   Avitaminosis D 05/08/2015    Allergies  Allergen Reactions   Baclofen Other (See Comments)    Hallucinations   Pioglitazone Nausea Only   Jardiance [Empagliflozin] Itching    Yeast vaginitis    Past Surgical History:  Procedure Laterality Date   CATARACT EXTRACTION, BILATERAL  01/2020   Dr. Zenaida Niece [facility]   COLOSTOMY REVERSAL  01/2014   ILEOSTOMY  05/2013   UMBILICAL HERNIA REPAIR  05/2018    Social History   Tobacco Use   Smoking status: Never   Smokeless tobacco: Never  Vaping Use   Vaping Use: Never used  Substance Use Topics  Alcohol use: No    Alcohol/week: 0.0 standard drinks of alcohol   Drug use: No     Medication list has been reviewed and updated.  Current Meds  Medication Sig   aspirin 81 MG chewable tablet Chew 81 mg by mouth daily.    atorvastatin (LIPITOR) 40 MG tablet TAKE 1 TABLET BY MOUTH DAILY   Bacillus Coagulans-Inulin (PROBIOTIC) 1-250 BILLION-MG CAPS Take 1 capsule by mouth daily.   calcium carbonate (OSCAL) 1500 (600 Ca) MG TABS tablet Take 600 mg of elemental calcium by mouth daily.   carbamazepine (TEGRETOL) 200 MG tablet TAKE 1 TABLET(200 MG) BY  MOUTH DAILY   Cholecalciferol 25 MCG (1000 UT) capsule Take 1,000 Units by mouth daily.    docusate sodium (COLACE) 250 MG capsule Take 250 mg by mouth daily.   Fluocinolone Acetonide 0.01 % OIL Place 1 drop into both ears at bedtime. Alternating ears each night   Glycerin (OASIS TEARS PF OP) Apply to eye.   insulin glargine (LANTUS SOLOSTAR) 100 UNIT/ML Solostar Pen INJECT 20 TO 25 UNITS UNDER THE SKIN DAILY (Patient taking differently: INJECT 30 UNITS UNDER THE SKIN DAILY)   metoprolol succinate (TOPROL-XL) 25 MG 24 hr tablet TAKE 1/2 TABLET(12.5 MG) BY MOUTH TWICE DAILY   nitroGLYCERIN (NITROSTAT) 0.4 MG SL tablet Place 1 tablet (0.4 mg total) under the tongue every 5 (five) minutes x 3 doses as needed for chest pain.   pantoprazole (PROTONIX) 40 MG tablet Take 1 tablet (40 mg total) by mouth daily as needed (heartburn).   ramipril (ALTACE) 10 MG capsule TAKE 1 CAPSULE(10 MG) BY MOUTH DAILY   [DISCONTINUED] lidocaine (LIDODERM) 5 % Place 1 patch onto the skin every 12 (twelve) hours. Remove & Discard patch within 12 hours or as directed by MD   [DISCONTINUED] ONETOUCH ULTRA test strip USE TO TEST BLOOD SUGAR 1-2 TIMES DAILY   [DISCONTINUED] Propylene Glycol (SYSTANE BALANCE OP) Place 1 drop into both eyes 2 (two) times daily as needed (dry eyes).   [DISCONTINUED] trolamine salicylate (ASPERCREME) 10 % cream Apply 1 application  topically as needed for muscle pain.       05/05/2023    1:22 PM 12/30/2022   10:45 AM 10/06/2022   11:16 AM 09/11/2022   10:14 AM  GAD 7 : Generalized Anxiety Score  Nervous, Anxious, on Edge 1 1 1  0  Control/stop worrying 1 1 0 0  Worry too much - different things 1 1 0 1  Trouble relaxing 1 0 1 0  Restless 0 0 0 0  Easily annoyed or irritable 0 0 0 1  Afraid - awful might happen 1 1 0 0  Total GAD 7 Score 5 4 2 2   Anxiety Difficulty Not difficult at all Not difficult at all Not difficult at all Not difficult at all       05/05/2023    1:21 PM 01/01/2023     2:37 PM 12/30/2022   10:45 AM  Depression screen PHQ 2/9  Decreased Interest 0 1 1  Down, Depressed, Hopeless 1 1 1   PHQ - 2 Score 1 2 2   Altered sleeping 1 1 1   Tired, decreased energy 1 1 1   Change in appetite 1 1 1   Feeling bad or failure about yourself  0 0 1  Trouble concentrating 0 0 1  Moving slowly or fidgety/restless 0 0 1  Suicidal thoughts 0 0 0  PHQ-9 Score 4 5 8   Difficult doing work/chores Not difficult at all Not difficult  at all Somewhat difficult    BP Readings from Last 3 Encounters:  05/05/23 128/70  01/01/23 138/60  12/30/22 125/74    Physical Exam Vitals and nursing note reviewed.  Constitutional:      General: She is not in acute distress.    Appearance: She is well-developed.  HENT:     Head: Normocephalic and atraumatic.  Cardiovascular:     Rate and Rhythm: Normal rate and regular rhythm.  Pulmonary:     Effort: Pulmonary effort is normal. No respiratory distress.     Breath sounds: No wheezing or rhonchi.  Musculoskeletal:     Cervical back: Normal range of motion.     Right lower leg: No edema.     Left lower leg: No edema.  Lymphadenopathy:     Cervical: No cervical adenopathy.  Skin:    General: Skin is warm and dry.     Findings: No rash.  Neurological:     Mental Status: She is alert and oriented to person, place, and time.  Psychiatric:        Mood and Affect: Mood normal.        Behavior: Behavior normal.     Wt Readings from Last 3 Encounters:  05/05/23 156 lb (70.8 kg)  01/01/23 156 lb 9.6 oz (71 kg)  12/30/22 154 lb 3.2 oz (69.9 kg)    BP 128/70   Pulse 72   Ht 5\' 2"  (1.575 m)   Wt 156 lb (70.8 kg)   SpO2 95%   BMI 28.53 kg/m   Assessment and Plan:  Problem List Items Addressed This Visit     Type II diabetes mellitus with complication (HCC) - Primary (Chronic)    Blood sugars stable without hypoglycemic symptoms or events. Currently being treated with insulin.  Dose increased last visit to 15-18 in AM and  15-18 in PM. Lab Results  Component Value Date   HGBA1C 8.8 (H) 12/30/2022  A1C today = 8.0 Recommend 20 u AM and 15 u PM      Relevant Medications   glucose blood (ONETOUCH ULTRA) test strip   Other Relevant Orders   POCT glycosylated hemoglobin (Hb A1C)   Gait disturbance   Other Visit Diagnoses     Type 2 diabetes mellitus treated with insulin (HCC)           Return in about 4 months (around 09/04/2023) for DM, HTN.   Partially dictated using Dragon software, any errors are not intentional.  Reubin Milan, MD [facility] Primary Care and Sports Medicine [place], [place]

## 2023-05-05 NOTE — Assessment & Plan Note (Addendum)
Blood sugars stable without hypoglycemic symptoms or events. Currently being treated with insulin.  Dose increased last visit to 15-18 in AM and 15-18 in PM. Lab Results  Component Value Date   HGBA1C 8.8 (H) 12/30/2022  A1C today = 8.0 Recommend 20 u AM and 15 u PM

## 2023-05-17 LAB — HM DIABETES EYE EXAM

## 2023-06-16 DIAGNOSIS — E113311 Type 2 diabetes mellitus with moderate nonproliferative diabetic retinopathy with macular edema, right eye: Secondary | ICD-10-CM | POA: Diagnosis not present

## 2023-06-16 DIAGNOSIS — H35033 Hypertensive retinopathy, bilateral: Secondary | ICD-10-CM | POA: Diagnosis not present

## 2023-06-16 DIAGNOSIS — Z7984 Long term (current) use of oral hypoglycemic drugs: Secondary | ICD-10-CM | POA: Diagnosis not present

## 2023-06-16 DIAGNOSIS — E113392 Type 2 diabetes mellitus with moderate nonproliferative diabetic retinopathy without macular edema, left eye: Secondary | ICD-10-CM | POA: Diagnosis not present

## 2023-06-22 ENCOUNTER — Encounter: Payer: Self-pay | Admitting: Internal Medicine

## 2023-06-29 ENCOUNTER — Other Ambulatory Visit: Payer: Self-pay | Admitting: Internal Medicine

## 2023-07-01 ENCOUNTER — Other Ambulatory Visit: Payer: Self-pay | Admitting: Internal Medicine

## 2023-07-01 DIAGNOSIS — E118 Type 2 diabetes mellitus with unspecified complications: Secondary | ICD-10-CM

## 2023-07-09 DIAGNOSIS — H903 Sensorineural hearing loss, bilateral: Secondary | ICD-10-CM | POA: Diagnosis not present

## 2023-07-09 DIAGNOSIS — H6063 Unspecified chronic otitis externa, bilateral: Secondary | ICD-10-CM | POA: Diagnosis not present

## 2023-07-09 DIAGNOSIS — H6123 Impacted cerumen, bilateral: Secondary | ICD-10-CM | POA: Diagnosis not present

## 2023-07-29 ENCOUNTER — Other Ambulatory Visit: Payer: Self-pay | Admitting: Internal Medicine

## 2023-07-29 DIAGNOSIS — I10 Essential (primary) hypertension: Secondary | ICD-10-CM

## 2023-08-19 ENCOUNTER — Ambulatory Visit (INDEPENDENT_AMBULATORY_CARE_PROVIDER_SITE_OTHER): Payer: Medicare Other | Admitting: Internal Medicine

## 2023-08-19 ENCOUNTER — Encounter: Payer: Self-pay | Admitting: Internal Medicine

## 2023-08-19 VITALS — BP 112/70 | HR 74 | Ht 62.0 in | Wt 160.4 lb

## 2023-08-19 DIAGNOSIS — I10 Essential (primary) hypertension: Secondary | ICD-10-CM

## 2023-08-19 DIAGNOSIS — Z794 Long term (current) use of insulin: Secondary | ICD-10-CM

## 2023-08-19 DIAGNOSIS — E118 Type 2 diabetes mellitus with unspecified complications: Secondary | ICD-10-CM | POA: Diagnosis not present

## 2023-08-19 DIAGNOSIS — Z23 Encounter for immunization: Secondary | ICD-10-CM

## 2023-08-19 MED ORDER — BLOOD GLUCOSE MONITORING SUPPL DEVI
1.0000 | Freq: Three times a day (TID) | 0 refills | Status: AC
Start: 2023-08-19 — End: ?

## 2023-08-19 NOTE — Assessment & Plan Note (Addendum)
Blood sugars stable without hypoglycemic symptoms or events. Current regimen is Lantus 20 u AM and 15 u PM. Changes made last visit are increasing AM dose.  Fasting BS around 75-115.  Lab Results  Component Value Date   HGBA1C 8.0 (A) 05/05/2023

## 2023-08-19 NOTE — Assessment & Plan Note (Signed)
Normal exam with stable BP on metoprolol and ramipril. No concerns or side effects to current medication. No change in regimen; continue low sodium diet.

## 2023-08-19 NOTE — Progress Notes (Signed)
Date:  08/19/2023   Name:  [name]   DOB:  02-Jul-1946   MRN:  [id number]   Chief Complaint: Diabetes and Hypertension  Diabetes She presents for her follow-up diabetic visit. She has type 2 diabetes mellitus. Pertinent negatives for diabetic complications include no CVA. Current diabetic treatment includes insulin injections. She is compliant with treatment all of the time. Her weight is stable. An ACE inhibitor/angiotensin II receptor blocker is being taken.  Hypertension This is a chronic problem. The problem is controlled. Past treatments include ACE inhibitors and beta blockers. The current treatment provides significant improvement. Hypertensive end-organ damage includes kidney disease. There is no history of CAD/MI or CVA.    Lab Results  Component Value Date   NA 142 12/30/2022   K 4.9 12/30/2022   CO2 24 12/30/2022   GLUCOSE 83 12/30/2022   BUN 15 12/30/2022   CREATININE 1.12 (H) 12/30/2022   CALCIUM 10.0 12/30/2022   EGFR 51 (L) 12/30/2022   GFRNONAA >60 04/23/2021   Lab Results  Component Value Date   CHOL 203 (H) 12/30/2022   HDL 94 12/30/2022   LDLCALC 94 12/30/2022   TRIG 88 12/30/2022   CHOLHDL 2.2 12/30/2022   Lab Results  Component Value Date   TSH 1.660 12/27/2021   Lab Results  Component Value Date   HGBA1C 8.0 (A) 05/05/2023   Lab Results  Component Value Date   WBC 7.8 12/30/2022   HGB 13.5 12/30/2022   HCT 41.0 12/30/2022   MCV 92 12/30/2022   PLT 235 12/30/2022   Lab Results  Component Value Date   ALT 11 12/30/2022   AST 14 12/30/2022   ALKPHOS 163 (H) 12/30/2022   BILITOT 0.4 12/30/2022   Lab Results  Component Value Date   VD25OH 35.3 12/25/2020       Patient Active Problem List   Diagnosis Date Noted   Gait disturbance 05/05/2023   Stage 3a chronic kidney disease (CKD) (HCC) 12/30/2022   Trigger finger, left ring finger 12/27/2021   Primary osteoarthritis of both hips 06/10/2019   Bilateral carotid artery  stenosis 08/24/2018   Atherosclerosis of aorta (HCC) 08/24/2018   Type II diabetes mellitus with complication (HCC) 08/18/2018   Bradycardia 07/24/2018   Neoplasm of uncertain behavior of skin 01/16/2017   Senile ecchymosis 08/20/2015   Essential (primary) hypertension 05/08/2015   History of rectal cancer 05/08/2015   Hyperlipidemia associated with type 2 diabetes mellitus (HCC) 05/08/2015   Seizure disorder (HCC) 05/08/2015   Shoulder strain 05/08/2015   Avitaminosis D 05/08/2015    Allergies  Allergen Reactions   Baclofen Other (See Comments)    Hallucinations   Pioglitazone Nausea Only   Jardiance [Empagliflozin] Itching    Yeast vaginitis    Past Surgical History:  Procedure Laterality Date   CATARACT EXTRACTION, BILATERAL  01/2020   Dr. Zenaida Niece [facility]   COLOSTOMY REVERSAL  01/2014   ILEOSTOMY  05/2013   UMBILICAL HERNIA REPAIR  05/2018    Social History   Tobacco Use   Smoking status: Never   Smokeless tobacco: Never  Vaping Use   Vaping status: Never Used  Substance Use Topics   Alcohol use: No    Alcohol/week: 0.0 standard drinks of alcohol   Drug use: No     Medication list has been reviewed and updated.  Current Meds  Medication Sig   aspirin 81 MG chewable tablet Chew 81 mg by mouth daily.    atorvastatin (LIPITOR) 40 MG  tablet TAKE 1 TABLET BY MOUTH DAILY   Bacillus Coagulans-Inulin (PROBIOTIC) 1-250 BILLION-MG CAPS Take 1 capsule by mouth daily.   Blood Glucose Monitoring Suppl DEVI 1 each by Does not apply route in the morning, at noon, and at bedtime. May substitute to any manufacturer covered by patient's insurance.   calcium carbonate (OSCAL) 1500 (600 Ca) MG TABS tablet Take 600 mg of elemental calcium by mouth daily.   carbamazepine (TEGRETOL) 200 MG tablet TAKE 1 TABLET(200 MG) BY MOUTH DAILY   Cholecalciferol 25 MCG (1000 UT) capsule Take 1,000 Units by mouth daily.    docusate sodium (COLACE) 250 MG capsule Take 250 mg by mouth daily.    Fluocinolone Acetonide 0.01 % OIL Place 1 drop into both ears at bedtime. Alternating ears each night   glucose blood (ONETOUCH ULTRA) test strip Use as instructed   Glycerin (OASIS TEARS PF OP) Apply to eye.   insulin glargine (LANTUS SOLOSTAR) 100 UNIT/ML Solostar Pen INJECT 30 UNITS UNDER THE SKIN DAILY   metoprolol succinate (TOPROL-XL) 25 MG 24 hr tablet TAKE 1/2 TABLET(12.5 MG) BY MOUTH TWICE DAILY   nitroGLYCERIN (NITROSTAT) 0.4 MG SL tablet Place 1 tablet (0.4 mg total) under the tongue every 5 (five) minutes x 3 doses as needed for chest pain.   ramipril (ALTACE) 10 MG capsule TAKE 1 CAPSULE(10 MG) BY MOUTH DAILY       08/19/2023    1:28 PM 05/05/2023    1:22 PM 12/30/2022   10:45 AM 10/06/2022   11:16 AM  GAD 7 : Generalized Anxiety Score  Nervous, Anxious, on Edge 1 1 1 1   Control/stop worrying 1 1 1  0  Worry too much - different things 1 1 1  0  Trouble relaxing 1 1 0 1  Restless 1 0 0 0  Easily annoyed or irritable 0 0 0 0  Afraid - awful might happen 1 1 1  0  Total GAD 7 Score 6 5 4 2   Anxiety Difficulty Somewhat difficult Not difficult at all Not difficult at all Not difficult at all       08/19/2023    1:28 PM 05/05/2023    1:21 PM 01/01/2023    2:37 PM  Depression screen PHQ 2/9  Decreased Interest 1 0 1  Down, Depressed, Hopeless 1 1 1   PHQ - 2 Score 2 1 2   Altered sleeping 1 1 1   Tired, decreased energy 1 1 1   Change in appetite 1 1 1   Feeling bad or failure about yourself  1 0 0  Trouble concentrating 1 0 0  Moving slowly or fidgety/restless 1 0 0  Suicidal thoughts 0 0 0  PHQ-9 Score 8 4 5   Difficult doing work/chores Somewhat difficult Not difficult at all Not difficult at all    BP Readings from Last 3 Encounters:  08/19/23 112/70  05/05/23 128/70  01/01/23 138/60    Physical Exam Vitals and nursing note reviewed.  Constitutional:      General: She is not in acute distress.    Appearance: Normal appearance. She is well-developed.  HENT:      Head: Normocephalic and atraumatic.  Neck:     Vascular: No carotid bruit.  Cardiovascular:     Rate and Rhythm: Normal rate and regular rhythm.     Heart sounds: No murmur heard. Pulmonary:     Effort: Pulmonary effort is normal. No respiratory distress.     Breath sounds: No wheezing or rhonchi.  Musculoskeletal:     Cervical  back: Normal range of motion.     Right lower leg: No edema.     Left lower leg: No edema.  Lymphadenopathy:     Cervical: No cervical adenopathy.  Skin:    General: Skin is warm and dry.     Capillary Refill: Capillary refill takes less than 2 seconds.     Findings: No rash.  Neurological:     General: No focal deficit present.     Mental Status: She is alert and oriented to person, place, and time.  Psychiatric:        Mood and Affect: Mood normal.        Behavior: Behavior normal.     Wt Readings from Last 3 Encounters:  08/19/23 160 lb 6.4 oz (72.8 kg)  05/05/23 156 lb (70.8 kg)  01/01/23 156 lb 9.6 oz (71 kg)    BP 112/70   Pulse 74   Ht 5\' 2"  (1.575 m)   Wt 160 lb 6.4 oz (72.8 kg)   SpO2 97%   BMI 29.34 kg/m   Assessment and Plan:  Problem List Items Addressed This Visit       Unprioritized   Essential (primary) hypertension (Chronic)    Normal exam with stable BP on metoprolol and ramipril. No concerns or side effects to current medication. No change in regimen; continue low sodium diet.       Type II diabetes mellitus with complication (HCC) - Primary (Chronic)    Blood sugars stable without hypoglycemic symptoms or events. Current regimen is Lantus 20 u AM and 15 u PM. Changes made last visit are increasing AM dose.  Fasting BS around 75-115.  Lab Results  Component Value Date   HGBA1C 8.0 (A) 05/05/2023         Relevant Medications   Blood Glucose Monitoring Suppl DEVI   Other Relevant Orders   Basic metabolic panel   Hemoglobin A1c   Other Visit Diagnoses     Long-term insulin use (HCC)       Need for  influenza vaccination       Relevant Orders   Flu Vaccine Trivalent High Dose (Fluad) (Completed)       Return in about 4 months (around 12/20/2023) for CPX.    Reubin Milan, MD [facility] Primary Care and Sports Medicine [place]

## 2023-08-20 LAB — HEMOGLOBIN A1C
Est. average glucose Bld gHb Est-mCnc: 171 mg/dL
Hgb A1c MFr Bld: 7.6 % — ABNORMAL HIGH (ref 4.8–5.6)

## 2023-08-20 LAB — BASIC METABOLIC PANEL
BUN/Creatinine Ratio: 14 (ref 12–28)
BUN: 17 mg/dL (ref 8–27)
CO2: 25 mmol/L (ref 20–29)
Calcium: 9.6 mg/dL (ref 8.7–10.3)
Chloride: 103 mmol/L (ref 96–106)
Creatinine, Ser: 1.24 mg/dL — ABNORMAL HIGH (ref 0.57–1.00)
Glucose: 112 mg/dL — ABNORMAL HIGH (ref 70–99)
Potassium: 4.8 mmol/L (ref 3.5–5.2)
Sodium: 141 mmol/L (ref 134–144)
eGFR: 45 mL/min/{1.73_m2} — ABNORMAL LOW (ref >59)

## 2023-09-22 ENCOUNTER — Other Ambulatory Visit: Payer: Self-pay | Admitting: Internal Medicine

## 2023-09-22 DIAGNOSIS — E1169 Type 2 diabetes mellitus with other specified complication: Secondary | ICD-10-CM

## 2023-09-23 NOTE — Telephone Encounter (Signed)
Requested Prescriptions  Pending Prescriptions Disp Refills   atorvastatin (LIPITOR) 40 MG tablet [Pharmacy Med Name: ATORVASTATIN 40MG  TABLETS] 90 tablet 0    Sig: TAKE 1 TABLET BY MOUTH DAILY     Cardiovascular:  Antilipid - Statins Failed - 09/22/2023  1:48 PM      Failed - Lipid Panel in normal range within the last 12 months    Cholesterol, Total  Date Value Ref Range Status  12/30/2022 203 (H) 100 - 199 mg/dL Final   LDL Chol Calc (NIH)  Date Value Ref Range Status  12/30/2022 94 0 - 99 mg/dL Final   HDL  Date Value Ref Range Status  12/30/2022 94 >39 mg/dL Final   Triglycerides  Date Value Ref Range Status  12/30/2022 88 0 - 149 mg/dL Final         Passed - Patient is not pregnant      Passed - Valid encounter within last 12 months    Recent Outpatient Visits           1 month ago Type II diabetes mellitus with complication (HCC)   [place] Primary Care & Sports Medicine at [facility], Nyoka Cowden, MD   4 months ago Type II diabetes mellitus with complication [facility])   [place] Primary Care & Sports Medicine at [facility], Nyoka Cowden, MD   8 months ago Annual physical exam   [facility] at [facility], Nyoka Cowden, MD   11 months ago Type II diabetes mellitus with complication [facility])   [place] Primary Care & Sports Medicine at [facility], Nyoka Cowden, MD   1 year ago Acute cough    Primary Care & Sports Medicine at [facility], Nyoka Cowden, MD       Future Appointments             In 4 months Judithann Graves, Nyoka Cowden, MD [facility] Primary Care & [facility] Medicine at [facility], [facility]

## 2023-10-13 ENCOUNTER — Other Ambulatory Visit: Payer: Self-pay | Admitting: Internal Medicine

## 2023-10-13 DIAGNOSIS — G40909 Epilepsy, unspecified, not intractable, without status epilepticus: Secondary | ICD-10-CM

## 2023-10-14 NOTE — Telephone Encounter (Signed)
Requested Prescriptions  Pending Prescriptions Disp Refills   carbamazepine (TEGRETOL) 200 MG tablet [Pharmacy Med Name: CARBAMAZEPINE 200MG  TABLETS] 90 tablet 2    Sig: TAKE 1 TABLET BY MOUTH DAILY     Neurology:  Anticonvulsants - carbamazepine Failed - 10/13/2023 10:59 AM      Failed - Cr in normal range and within 360 days    Creatinine  Date Value Ref Range Status  05/04/2013 1.01 0.60 - 1.30 mg/dL Final   Creatinine, Ser  Date Value Ref Range Status  08/19/2023 1.24 (H) 0.57 - 1.00 mg/dL Final         Passed - AST in normal range and within 360 days    AST  Date Value Ref Range Status  12/30/2022 14 0 - 40 IU/L Final   SGOT(AST)  Date Value Ref Range Status  05/04/2013 11 (L) 15 - 37 Unit/L Final         Passed - ALT in normal range and within 360 days    ALT  Date Value Ref Range Status  12/30/2022 11 0 - 32 IU/L Final   SGPT (ALT)  Date Value Ref Range Status  05/04/2013 19 12 - 78 U/L Final         Passed - Carbamazepine (serum) in normal range and within 360 days    Carbamazepine (Tegretol), S  Date Value Ref Range Status  12/30/2022 6.3 4.0 - 12.0 ug/mL Final    Comment:             In conjunction with other antiepileptic drugs                                Therapeutic  4.0 -  8.0                                Toxicity     9.0 - 12.0                                    Carbamazepine alone                                Therapeutic  8.0 - 12.0                                 Detection Limit =  2.0                           <2.0 indicates None Detected          Passed - WBC in normal range and within 360 days    WBC  Date Value Ref Range Status  12/30/2022 7.8 3.4 - 10.8 x10E3/uL Final  04/23/2021 8.0 4.0 - 10.5 K/uL Final         Passed - PLT in normal range and within 360 days    Platelets  Date Value Ref Range Status  12/30/2022 235 150 - 450 x10E3/uL Final         Passed - HGB in normal range and within 360 days    Hemoglobin  Date  Value Ref Range Status  12/30/2022 13.5 11.1 - 15.9  g/dL Final         Passed - Na in normal range and within 360 days    Sodium  Date Value Ref Range Status  08/19/2023 141 134 - 144 mmol/L Final  04/13/2013 136 136 - 145 mmol/L Final         Passed - HCT in normal range and within 360 days    Hematocrit  Date Value Ref Range Status  12/30/2022 41.0 34.0 - 46.6 % Final         Passed - Completed PHQ-2 or PHQ-9 in the last 360 days      Passed - Valid encounter within last 12 months    Recent Outpatient Visits           1 month ago Type II diabetes mellitus with complication [facility])   [place] Primary Care & Sports Medicine at [facility], Nyoka Cowden, MD   5 months ago Type II diabetes mellitus with complication [facility])   [place] Primary Care & Sports Medicine at [facility], Nyoka Cowden, MD   9 months ago Annual physical exam   [facility] at [facility], Nyoka Cowden, MD   1 year ago Type II diabetes mellitus with complication [facility])   [place] Primary Care & Sports Medicine at [facility], Nyoka Cowden, MD   1 year ago Acute cough   [place] Primary Care & Sports Medicine at [facility], Nyoka Cowden, MD       Future Appointments             In 3 months Judithann Graves, Nyoka Cowden, MD [facility] Primary Care & Sports Medicine at [facility], [facility]

## 2023-10-20 DIAGNOSIS — H35033 Hypertensive retinopathy, bilateral: Secondary | ICD-10-CM | POA: Diagnosis not present

## 2023-10-20 DIAGNOSIS — Z7984 Long term (current) use of oral hypoglycemic drugs: Secondary | ICD-10-CM | POA: Diagnosis not present

## 2023-10-20 DIAGNOSIS — E113392 Type 2 diabetes mellitus with moderate nonproliferative diabetic retinopathy without macular edema, left eye: Secondary | ICD-10-CM | POA: Diagnosis not present

## 2023-10-20 DIAGNOSIS — E113311 Type 2 diabetes mellitus with moderate nonproliferative diabetic retinopathy with macular edema, right eye: Secondary | ICD-10-CM | POA: Diagnosis not present

## 2023-10-20 DIAGNOSIS — I10 Essential (primary) hypertension: Secondary | ICD-10-CM | POA: Diagnosis not present

## 2023-10-21 DIAGNOSIS — E119 Type 2 diabetes mellitus without complications: Secondary | ICD-10-CM | POA: Diagnosis not present

## 2023-10-21 DIAGNOSIS — L851 Acquired keratosis [keratoderma] palmaris et plantaris: Secondary | ICD-10-CM | POA: Diagnosis not present

## 2023-10-21 DIAGNOSIS — B351 Tinea unguium: Secondary | ICD-10-CM | POA: Diagnosis not present

## 2023-11-05 DIAGNOSIS — H6123 Impacted cerumen, bilateral: Secondary | ICD-10-CM | POA: Diagnosis not present

## 2023-11-05 DIAGNOSIS — H6063 Unspecified chronic otitis externa, bilateral: Secondary | ICD-10-CM | POA: Diagnosis not present

## 2023-12-28 ENCOUNTER — Other Ambulatory Visit: Payer: Self-pay | Admitting: Internal Medicine

## 2023-12-28 DIAGNOSIS — E1169 Type 2 diabetes mellitus with other specified complication: Secondary | ICD-10-CM

## 2023-12-29 NOTE — Telephone Encounter (Signed)
Requested Prescriptions  Pending Prescriptions Disp Refills   atorvastatin (LIPITOR) 40 MG tablet [Pharmacy Med Name: ATORVASTATIN 40MG  TABLETS] 90 tablet 0    Sig: TAKE 1 TABLET BY MOUTH DAILY     Cardiovascular:  Antilipid - Statins Failed - 12/29/2023 12:05 PM      Failed - Lipid Panel in normal range within the last 12 months    Cholesterol, Total  Date Value Ref Range Status  12/30/2022 203 (H) 100 - 199 mg/dL Final   LDL Chol Calc (NIH)  Date Value Ref Range Status  12/30/2022 94 0 - 99 mg/dL Final   HDL  Date Value Ref Range Status  12/30/2022 94 >39 mg/dL Final   Triglycerides  Date Value Ref Range Status  12/30/2022 88 0 - 149 mg/dL Final         Passed - Patient is not pregnant      Passed - Valid encounter within last 12 months    Recent Outpatient Visits           4 months ago Type II diabetes mellitus with complication (HCC)   [place] [facility] at [facility], Nyoka Cowden, MD   7 months ago Type II diabetes mellitus with complication [facility])   [place] [facility] at [facility], Nyoka Cowden, MD   12 months ago Annual physical exam   [facility] at [facility], Nyoka Cowden, MD   1 year ago Type II diabetes mellitus with complication [facility])   [place] [facility] at [facility], Nyoka Cowden, MD   1 year ago Acute cough   [place] [facility] at [facility], Nyoka Cowden, MD       Future Appointments             In 3 weeks Judithann Graves Nyoka Cowden, MD [facility] at [facility], [facility]

## 2023-12-30 ENCOUNTER — Other Ambulatory Visit: Payer: Self-pay | Admitting: Internal Medicine

## 2023-12-31 NOTE — Telephone Encounter (Signed)
Requested Prescriptions  Pending Prescriptions Disp Refills   metoprolol succinate (TOPROL-XL) 25 MG 24 hr tablet [Pharmacy Med Name: METOPROLOL ER SUCCINATE 25MG  TABS] 90 tablet 0    Sig: TAKE 1/2 TABLET(12.5 MG) BY MOUTH TWICE DAILY     Cardiovascular:  Beta Blockers Passed - 12/31/2023  1:21 PM      Passed - Last BP in normal range    BP Readings from Last 1 Encounters:  08/19/23 112/70         Passed - Last Heart Rate in normal range    Pulse Readings from Last 1 Encounters:  08/19/23 74         Passed - Valid encounter within last 6 months    Recent Outpatient Visits           4 months ago Type II diabetes mellitus with complication (HCC)   [place] [facility] at [facility], Nyoka Cowden, MD   8 months ago Type II diabetes mellitus with complication [facility])   [place] [facility] at [facility], Nyoka Cowden, MD   1 year ago Annual physical exam   [facility] at [facility], Nyoka Cowden, MD   1 year ago Type II diabetes mellitus with complication [facility])   [place] [facility] at [facility], Nyoka Cowden, MD   1 year ago Acute cough   [place] [facility] at [facility], Nyoka Cowden, MD       Future Appointments             In 3 weeks Judithann Graves Nyoka Cowden, MD [facility] at [facility], [facility]

## 2024-01-13 ENCOUNTER — Ambulatory Visit (INDEPENDENT_AMBULATORY_CARE_PROVIDER_SITE_OTHER): Payer: Medicare Other

## 2024-01-13 DIAGNOSIS — Z0000 Encounter for general adult medical examination without abnormal findings: Secondary | ICD-10-CM | POA: Diagnosis not present

## 2024-01-13 NOTE — Patient Instructions (Addendum)
 Ms. Speece , Thank you for taking time to come for your Medicare Wellness Visit. I appreciate your ongoing commitment to your health goals. Please review the following plan we discussed and let me know if I can assist you in the future.   Referrals/Orders/Follow-Ups/Clinician Recommendations: NONE  This is a list of the screening recommended for you and due dates:  Health Maintenance  Topic Date Due   Zoster (Shingles) Vaccine (1 of 2) Never done   COVID-19 Vaccine (3 - Pfizer risk series) 03/09/2020   Yearly kidney health urinalysis for diabetes  12/31/2023   Complete foot exam   12/31/2023   Colon Cancer Screening  04/09/2024   Hemoglobin A1C  02/17/2024   Mammogram  04/27/2024   Eye exam for diabetics  05/16/2024   Yearly kidney function blood test for diabetes  08/18/2024   DTaP/Tdap/Td vaccine (2 - Td or Tdap) 12/19/2024   Medicare Annual Wellness Visit  01/12/2025   Pneumonia Vaccine  Completed   Flu Shot  Completed   DEXA scan (bone density measurement)  Completed   Hepatitis C Screening  Completed   HPV Vaccine  Aged Out    Advanced directives: (ACP Link)Information on Advanced Care Planning can be found at [facility] Advance Health Care Directives Advance Health Care Directives ([email or web address]   Next Medicare Annual Wellness Visit scheduled for next year: Yes   01/18/25 @ 8:50 AM BY PHONE

## 2024-01-13 NOTE — Progress Notes (Signed)
 Subjective:   [name] is a 78 y.o. who presents for a Medicare Wellness preventive visit.  Visit Complete: Virtual I connected with  Margart Sickles on 01/13/24 by a audio enabled telemedicine application and verified that I am speaking with the correct person using two identifiers.  Patient Location: Home  Provider Location: Office/Clinic  I discussed the limitations of evaluation and management by telemedicine. The patient expressed understanding and agreed to proceed.  Vital Signs: Because this visit was a virtual/telehealth visit, some criteria may be missing or patient reported. Any vitals not documented were not able to be obtained and vitals that have been documented are patient reported.  VideoDeclined- This patient declined Librarian, academic. Therefore the visit was completed with audio only.  AWV Questionnaire: No: Patient Medicare AWV questionnaire was not completed prior to this visit.  Cardiac Risk Factors include: advanced age (>43men, >36 women);dyslipidemia;hypertension;diabetes mellitus     Objective:    There were no vitals filed for this visit. There is no height or weight on file to calculate BMI.     01/13/2024    9:36 AM 01/01/2023    2:41 PM 06/19/2022    9:34 PM 12/25/2021    3:07 PM 05/24/2021    6:57 AM 03/31/2021    1:52 AM 03/24/2021    6:42 PM  Advanced Directives  Does Patient Have a Medical Advance Directive? No No No No No No No  Would patient like information on creating a medical advance directive? No - Patient declined No - Patient declined No - Patient declined No - Patient declined No - Patient declined  No - Patient declined    Current Medications (verified) Outpatient Encounter Medications as of 01/13/2024  Medication Sig   aspirin 81 MG chewable tablet Chew 81 mg by mouth daily.    atorvastatin (LIPITOR) 40 MG tablet TAKE 1 TABLET BY MOUTH DAILY   Bacillus Coagulans-Inulin (PROBIOTIC) 1-250 BILLION-MG  CAPS Take 1 capsule by mouth daily.   Blood Glucose Monitoring Suppl DEVI 1 each by Does not apply route in the morning, at noon, and at bedtime. May substitute to any manufacturer covered by patient's insurance.   calcium carbonate (OSCAL) 1500 (600 Ca) MG TABS tablet Take 600 mg of elemental calcium by mouth daily.   carbamazepine (TEGRETOL) 200 MG tablet TAKE 1 TABLET BY MOUTH DAILY   Cholecalciferol 25 MCG (1000 UT) capsule Take 1,000 Units by mouth daily.    docusate sodium (COLACE) 250 MG capsule Take 250 mg by mouth daily.   Fluocinolone Acetonide 0.01 % OIL Place 1 drop into both ears at bedtime. Alternating ears each night   glucose blood (ONETOUCH ULTRA) test strip Use as instructed   Glycerin (OASIS TEARS PF OP) Apply to eye.   insulin glargine (LANTUS SOLOSTAR) 100 UNIT/ML Solostar Pen INJECT 30 UNITS UNDER THE SKIN DAILY   metoprolol succinate (TOPROL-XL) 25 MG 24 hr tablet TAKE 1/2 TABLET(12.5 MG) BY MOUTH TWICE DAILY   nitroGLYCERIN (NITROSTAT) 0.4 MG SL tablet Place 1 tablet (0.4 mg total) under the tongue every 5 (five) minutes x 3 doses as needed for chest pain.   ramipril (ALTACE) 10 MG capsule TAKE 1 CAPSULE(10 MG) BY MOUTH DAILY   pantoprazole (PROTONIX) 40 MG tablet Take 1 tablet (40 mg total) by mouth daily as needed (heartburn).   No facility-administered encounter medications on file as of 01/13/2024.    Allergies (verified) Baclofen, Pioglitazone, and Jardiance [empagliflozin]   History: Past Medical History:  Diagnosis Date   Allergy    Arthritis of knee, degenerative 05/08/2015   Basal cell carcinoma 03/2019   nose   Colon cancer (HCC) 2014   Partial colon resection, chemo + rad tx's.    Complication of anesthesia    Diabetes mellitus without complication (HCC)    Pt takes Metformin.   Hyperlipidemia    Hypertension    PONV (postoperative nausea and vomiting)    SBO (small bowel obstruction) (HCC) 04/20/2018   Seizures (HCC)    Umbilical hernia with  obstruction but no gangrene    Vitamin D deficiency    Past Surgical History:  Procedure Laterality Date   CATARACT EXTRACTION, BILATERAL  01/2020   Dr. Zenaida Niece [facility]   COLOSTOMY REVERSAL  01/2014   ILEOSTOMY  05/2013   UMBILICAL HERNIA REPAIR  05/2018   Family History  Problem Relation Age of Onset   Cancer Mother 60       unknown type   Diabetes Daughter    Diabetes Son    Social History   Socioeconomic History   Marital status: Widowed    Spouse name: Not on file   Number of children: 3   Years of education: Not on file   Highest education level: Some college, no degree  Occupational History   Not on file  Tobacco Use   Smoking status: Never   Smokeless tobacco: Never  Vaping Use   Vaping status: Never Used  Substance and Sexual Activity   Alcohol use: No    Alcohol/week: 0.0 standard drinks of alcohol   Drug use: No   Sexual activity: Not Currently  Other Topics Concern   Not on file  Social History Narrative   Pt's daughter lives with her   Social Drivers of Health   Financial Resource Strain: Low Risk  (01/13/2024)   Overall Financial Resource Strain (CARDIA)    Difficulty of Paying Living Expenses: Not hard at all  Food Insecurity: No Food Insecurity (01/13/2024)   Hunger Vital Sign    Worried About Running Out of Food in the Last Year: Never true    Ran Out of Food in the Last Year: Never true  Transportation Needs: No Transportation Needs (01/13/2024)   PRAPARE - Administrator, Civil Service (Medical): No    Lack of Transportation (Non-Medical): No  Physical Activity: Insufficiently Active (01/13/2024)   Exercise Vital Sign    Days of Exercise per Week: 5 days    Minutes of Exercise per Session: 20 min  Stress: No Stress Concern Present (01/13/2024)   [employer] of Occupational Health - Occupational Stress Questionnaire    Feeling of Stress : Only a little  Social Connections: Moderately Isolated (01/13/2024)   Social Connection and  Isolation Panel [NHANES]    Frequency of Communication with Friends and Family: More than three times a week    Frequency of Social Gatherings with Friends and Family: More than three times a week    Attends Religious Services: More than 4 times per year    Active Member of [employer] or Organizations: No    Attends Banker Meetings: Never    Marital Status: Widowed    Tobacco Counseling Counseling given: Not Answered    Clinical Intake:  Pre-visit preparation completed: Yes  Pain : No/denies pain     BMI - recorded: 29.3 Nutritional Status: BMI 25 -29 Overweight Nutritional Risks: None Diabetes: Yes CBG done?: No Did pt. bring in CBG monitor from home?: No  How often do you need to have someone help you when you read instructions, pamphlets, or other written materials from your doctor or pharmacy?: 1 - Never  Interpreter Needed?: No  Information entered by :: Kennedy Bucker, LPN   Activities of Daily Living     01/13/2024    9:38 AM  In your present state of health, do you have any difficulty performing the following activities:  Hearing? 1  Vision? 0  Difficulty concentrating or making decisions? 0  Walking or climbing stairs? 1  Comment ONE STEP AT A TIME  Dressing or bathing? 0  Doing errands, shopping? 0  Preparing Food and eating ? N  Using the Toilet? N  In the past six months, have you accidently leaked urine? Y  Do you have problems with loss of bowel control? N  Managing your Medications? N  Managing your Finances? N  Housekeeping or managing your Housekeeping? N    Patient Care Team: Reubin Milan, MD as PCP - General (Internal Medicine) Marcina Millard, MD as Consulting Physician (Cardiology) Bud Face, MD as Referring Physician (Otolaryngology) Jesusita Oka, MD (Dermatology) Glynis Smiles, MD as Referring Physician (Surgical Oncology) Soundra Pilon, OD as Referring Physician (Optometry)  Indicate any recent  Medical Services you may have received from other than [facility] providers in the past year (date may be approximate).     Assessment:   This is a routine wellness examination for Hettie.  Hearing/Vision screen Hearing Screening - Comments:: NO AIDS Vision Screening - Comments:: READERS- DR.[email or web address]   Goals Addressed             This Visit's Progress    DIET - INCREASE WATER INTAKE         Depression Screen     01/13/2024    9:34 AM 08/19/2023    1:28 PM 05/05/2023    1:21 PM 01/01/2023    2:37 PM 12/30/2022   10:45 AM 10/06/2022   11:15 AM 09/11/2022   10:14 AM  PHQ 2/9 Scores  PHQ - 2 Score 3 2 1 2 2 2  0  PHQ- 9 Score 5 8 4 5 8 4 3     Fall Risk     01/13/2024    9:38 AM 08/19/2023    1:27 PM 05/05/2023    1:22 PM 01/01/2023    2:43 PM 12/30/2022   10:45 AM  Fall Risk   Falls in the past year? 0 0 1 1 1   Number falls in past yr: 0 0 0 1 1  Injury with Fall? 0 0 1 1 1   Risk for fall due to : History of fall(s) No Fall Risks History of fall(s) Impaired balance/gait;History of fall(s) History of fall(s);Impaired balance/gait  Follow up Falls prevention discussed;Falls evaluation completed Falls evaluation completed  Falls evaluation completed;Falls prevention discussed Falls evaluation completed    MEDICARE RISK AT HOME:  Medicare Risk at Home Any stairs in or around the home?: Yes If so, are there any without handrails?: Yes Home free of loose throw rugs in walkways, pet beds, electrical cords, etc?: Yes Adequate lighting in your home to reduce risk of falls?: Yes Life alert?: No Use of a cane, walker or w/c?: Yes (CANE OR WALKER OCCASIONALLY) Grab bars in the bathroom?: Yes Shower chair or bench in shower?: Yes Elevated toilet seat or a handicapped toilet?: Yes  TIMED UP AND GO:  Was the test performed?  No  Cognitive Function: 6CIT completed        01/13/2024  9:40 AM 01/01/2023    2:59 PM 12/24/2020    4:01 PM 12/05/2019   10:24 AM 12/01/2018   10:51 AM  6CIT  Screen  What Year? 0 points 0 points 0 points 0 points 0 points  What month? 0 points 0 points 0 points 0 points 0 points  What time? 0 points 0 points 0 points 0 points 0 points  Count back from 20 0 points 0 points 0 points 0 points 0 points  Months in reverse 0 points 0 points 0 points 0 points 0 points  Repeat phrase 0 points 0 points 0 points 0 points 0 points  Total Score 0 points 0 points 0 points 0 points 0 points    Immunizations Immunization History  Administered Date(s) Administered   Fluad Quad(high Dose 65+) 08/29/2019, 08/23/2020, 08/01/2021, 10/06/2022   Fluad Trivalent(High Dose 65+) 08/19/2023   Influenza, High Dose Seasonal PF 09/21/2017, 09/02/2018   Influenza,inj,Quad PF,6+ Mos 08/20/2015, 09/17/2016   PFIZER(Purple Top)SARS-COV-2 Vaccination 01/10/2020, 02/10/2020   Pneumococcal Conjugate-13 10/22/2015   Pneumococcal Polysaccharide-23 07/17/2017   Tdap 12/19/2014    Screening Tests Health Maintenance  Topic Date Due   Zoster Vaccines- Shingrix (1 of 2) Never done   COVID-19 Vaccine (3 - Pfizer risk series) 03/09/2020   Diabetic kidney evaluation - Urine ACR  12/31/2023   FOOT EXAM  12/31/2023   Colonoscopy  04/09/2024   HEMOGLOBIN A1C  02/17/2024   MAMMOGRAM  04/27/2024   OPHTHALMOLOGY EXAM  05/16/2024   Diabetic kidney evaluation - eGFR measurement  08/18/2024   DTaP/Tdap/Td (2 - Td or Tdap) 12/19/2024   Medicare Annual Wellness (AWV)  01/12/2025   Pneumonia Vaccine 73+ Years old  Completed   INFLUENZA VACCINE  Completed   DEXA SCAN  Completed   Hepatitis C Screening  Completed   HPV VACCINES  Aged Out    Health Maintenance  Health Maintenance Due  Topic Date Due   Zoster Vaccines- Shingrix (1 of 2) Never done   COVID-19 Vaccine (3 - Pfizer risk series) 03/09/2020   Diabetic kidney evaluation - Urine ACR  12/31/2023   FOOT EXAM  12/31/2023   Colonoscopy  04/09/2024   Health Maintenance Items Addressed: UP TO DATE - NEEDS FOOT  EXAM  Additional Screening:  Vision Screening: Recommended annual ophthalmology exams for early detection of glaucoma and other disorders of the eye.  Dental Screening: Recommended annual dental exams for proper oral hygiene  Community Resource Referral / Chronic Care Management: CRR required this visit?  No   CCM required this visit?  No     Plan:     I have personally reviewed and noted the following in the patient's chart:   Medical and social history Use of alcohol, tobacco or illicit drugs  Current medications and supplements including opioid prescriptions. Patient is not currently taking opioid prescriptions. Functional ability and status Nutritional status Physical activity Advanced directives List of other physicians Hospitalizations, surgeries, and ER visits in previous 12 months Vitals Screenings to include cognitive, depression, and falls Referrals and appointments  In addition, I have reviewed and discussed with patient certain preventive protocols, quality metrics, and best practice recommendations. A written personalized care plan for preventive services as well as general preventive health recommendations were provided to patient.     Hal Hope, LPN   1/32/4401   After Visit Summary: (MyChart) Due to this being a telephonic visit, the after visit summary with patients personalized plan was offered to patient via MyChart  Notes: Nothing significant to report at this time.

## 2024-01-21 ENCOUNTER — Ambulatory Visit (INDEPENDENT_AMBULATORY_CARE_PROVIDER_SITE_OTHER): Payer: Medicare Other | Admitting: Internal Medicine

## 2024-01-21 ENCOUNTER — Encounter: Payer: Self-pay | Admitting: Internal Medicine

## 2024-01-21 VITALS — BP 136/70 | HR 63 | Ht 62.0 in | Wt 154.1 lb

## 2024-01-21 DIAGNOSIS — E118 Type 2 diabetes mellitus with unspecified complications: Secondary | ICD-10-CM

## 2024-01-21 DIAGNOSIS — N1831 Chronic kidney disease, stage 3a: Secondary | ICD-10-CM | POA: Diagnosis not present

## 2024-01-21 DIAGNOSIS — Z85048 Personal history of other malignant neoplasm of rectum, rectosigmoid junction, and anus: Secondary | ICD-10-CM

## 2024-01-21 DIAGNOSIS — E1169 Type 2 diabetes mellitus with other specified complication: Secondary | ICD-10-CM

## 2024-01-21 DIAGNOSIS — Z794 Long term (current) use of insulin: Secondary | ICD-10-CM

## 2024-01-21 DIAGNOSIS — Z0000 Encounter for general adult medical examination without abnormal findings: Secondary | ICD-10-CM

## 2024-01-21 DIAGNOSIS — I10 Essential (primary) hypertension: Secondary | ICD-10-CM | POA: Diagnosis not present

## 2024-01-21 DIAGNOSIS — G40909 Epilepsy, unspecified, not intractable, without status epilepticus: Secondary | ICD-10-CM

## 2024-01-21 DIAGNOSIS — E785 Hyperlipidemia, unspecified: Secondary | ICD-10-CM | POA: Diagnosis not present

## 2024-01-21 DIAGNOSIS — Z1231 Encounter for screening mammogram for malignant neoplasm of breast: Secondary | ICD-10-CM

## 2024-01-21 MED ORDER — LANTUS SOLOSTAR 100 UNIT/ML ~~LOC~~ SOPN: PEN_INJECTOR | SUBCUTANEOUS | 1 refills | Status: DC

## 2024-01-21 MED ORDER — RAMIPRIL 10 MG PO CAPS
10.0000 mg | ORAL_CAPSULE | Freq: Every day | ORAL | 1 refills | Status: DC
Start: 2024-01-21 — End: 2024-07-21

## 2024-01-21 NOTE — Assessment & Plan Note (Signed)
 Controlled BP with normal exam. Current regimen is metoprolol and ramipril. Will continue same medications; encourage continued reduced sodium diet.

## 2024-01-21 NOTE — Assessment & Plan Note (Signed)
 LDL is  Lab Results  Component Value Date   LDLCALC 94 12/30/2022   Current regimen is atorvastatin.  Tolerating medications well without issues.

## 2024-01-21 NOTE — Assessment & Plan Note (Signed)
 Blood sugars stable without hypoglycemic symptoms or events. Currently managed with Insulin. Changes made last visit are none. Lab Results  Component Value Date   HGBA1C 7.6 (H) 08/19/2023

## 2024-01-21 NOTE — Assessment & Plan Note (Signed)
 No seizure activity in years. Will continue current medications without change.

## 2024-01-21 NOTE — Assessment & Plan Note (Signed)
 Last colonoscopy done 2024 - no further exams needed per GI

## 2024-01-21 NOTE — Progress Notes (Signed)
 Date:  01/21/2024   Name:  [name]   DOB:  [birth year]   MRN:  [id number]   Chief Complaint: Annual Exam [name] is a 78 y.o. female who presents today for her Complete Annual Exam. She feels poorly, recent death in the family. She reports exercising none. She reports she is sleeping fairly well. Breast complaints none.  She is grieving the sudden death of her daughter in law.  Health Maintenance  Topic Date Due   Yearly kidney health urinalysis for diabetes  12/31/2023   COVID-19 Vaccine (3 - Pfizer risk series) 02/06/2024*   Zoster (Shingles) Vaccine (1 of 2) 04/22/2024*   Hemoglobin A1C  02/17/2024   Mammogram  04/27/2024   Eye exam for diabetics  05/16/2024   Yearly kidney function blood test for diabetes  08/18/2024   DTaP/Tdap/Td vaccine (2 - Td or Tdap) 12/19/2024   Medicare Annual Wellness Visit  01/12/2025   Complete foot exam   01/20/2025   Pneumonia Vaccine  Completed   Flu Shot  Completed   DEXA scan (bone density measurement)  Completed   Hepatitis C Screening  Completed   HPV Vaccine  Aged Out   Colon Cancer Screening  Discontinued  *Topic was postponed. The date shown is not the original due date.    Hypertension This is a chronic problem. The problem is controlled. Pertinent negatives include no chest pain, headaches, palpitations or shortness of breath. Past treatments include ACE inhibitors and beta blockers. The current treatment provides significant improvement.  Diabetes She presents for her follow-up diabetic visit. She has type 2 diabetes mellitus. Her disease course has been stable. Pertinent negatives for hypoglycemia include no headaches or tremors. Pertinent negatives for diabetes include no chest pain, no fatigue, no polydipsia and no polyuria. Current diabetic treatment includes insulin injections.  Hyperlipidemia This is a chronic problem. The problem is controlled. Pertinent negatives include no chest pain or shortness of breath.  Current antihyperlipidemic treatment includes statins.    Review of Systems  Constitutional:  Negative for appetite change, fatigue, fever and unexpected weight change.  HENT:  Negative for tinnitus and trouble swallowing.   Eyes:  Negative for visual disturbance.  Respiratory:  Negative for cough, chest tightness and shortness of breath.   Cardiovascular:  Negative for chest pain, palpitations and leg swelling.  Gastrointestinal:  Negative for abdominal pain.  Endocrine: Negative for polydipsia and polyuria.  Genitourinary:  Negative for dysuria and hematuria.  Musculoskeletal:  Negative for arthralgias.  Neurological:  Negative for tremors, numbness and headaches.  Psychiatric/Behavioral:  Negative for dysphoric mood.      Lab Results  Component Value Date   NA 141 08/19/2023   K 4.8 08/19/2023   CO2 25 08/19/2023   GLUCOSE 112 (H) 08/19/2023   BUN 17 08/19/2023   CREATININE 1.24 (H) 08/19/2023   CALCIUM 9.6 08/19/2023   EGFR 45 (L) 08/19/2023   GFRNONAA >60 04/23/2021   Lab Results  Component Value Date   CHOL 203 (H) 12/30/2022   HDL 94 12/30/2022   LDLCALC 94 12/30/2022   TRIG 88 12/30/2022   CHOLHDL 2.2 12/30/2022   Lab Results  Component Value Date   TSH 1.660 12/27/2021   Lab Results  Component Value Date   HGBA1C 7.6 (H) 08/19/2023   Lab Results  Component Value Date   WBC 7.8 12/30/2022   HGB 13.5 12/30/2022   HCT 41.0 12/30/2022   MCV 92 12/30/2022   PLT 235 12/30/2022  Lab Results  Component Value Date   ALT 11 12/30/2022   AST 14 12/30/2022   ALKPHOS 163 (H) 12/30/2022   BILITOT 0.4 12/30/2022   Lab Results  Component Value Date   VD25OH 35.3 12/25/2020     Patient Active Problem List   Diagnosis Date Noted   Gait disturbance 05/05/2023   Stage 3a chronic kidney disease (CKD) (HCC) 12/30/2022   Trigger finger, left ring finger 12/27/2021   Primary osteoarthritis of both hips 06/10/2019   Bilateral carotid artery stenosis 08/24/2018    Atherosclerosis of aorta (HCC) 08/24/2018   Type II diabetes mellitus with complication (HCC) 08/18/2018   Bradycardia 07/24/2018   Neoplasm of uncertain behavior of skin 01/16/2017   Senile ecchymosis 08/20/2015   Essential (primary) hypertension 05/08/2015   History of rectal cancer 05/08/2015   Hyperlipidemia associated with type 2 diabetes mellitus (HCC) 05/08/2015   Seizure disorder (HCC) 05/08/2015   Shoulder strain 05/08/2015   Avitaminosis D 05/08/2015    Allergies  Allergen Reactions   Baclofen Other (See Comments)    Hallucinations   Pioglitazone Nausea Only   Jardiance [Empagliflozin] Itching    Yeast vaginitis    Past Surgical History:  Procedure Laterality Date   CATARACT EXTRACTION, BILATERAL  01/2020   Dr. Zenaida Niece [facility]   COLOSTOMY REVERSAL  01/2014   ILEOSTOMY  05/2013   UMBILICAL HERNIA REPAIR  05/2018    Social History   Tobacco Use   Smoking status: Never   Smokeless tobacco: Never  Vaping Use   Vaping status: Never Used  Substance Use Topics   Alcohol use: No    Alcohol/week: 0.0 standard drinks of alcohol   Drug use: No     Medication list has been reviewed and updated.  Current Meds  Medication Sig   aspirin 81 MG chewable tablet Chew 81 mg by mouth daily.    atorvastatin (LIPITOR) 40 MG tablet TAKE 1 TABLET BY MOUTH DAILY   Bacillus Coagulans-Inulin (PROBIOTIC) 1-250 BILLION-MG CAPS Take 1 capsule by mouth daily.   Blood Glucose Monitoring Suppl DEVI 1 each by Does not apply route in the morning, at noon, and at bedtime. May substitute to any manufacturer covered by patient's insurance.   calcium carbonate (OSCAL) 1500 (600 Ca) MG TABS tablet Take 600 mg of elemental calcium by mouth daily.   carbamazepine (TEGRETOL) 200 MG tablet TAKE 1 TABLET BY MOUTH DAILY   Cholecalciferol 25 MCG (1000 UT) capsule Take 1,000 Units by mouth daily.    docusate sodium (COLACE) 250 MG capsule Take 250 mg by mouth daily.   glucose blood (ONETOUCH ULTRA) test  strip Use as instructed   Glycerin (OASIS TEARS PF OP) Apply to eye.   metoprolol succinate (TOPROL-XL) 25 MG 24 hr tablet TAKE 1/2 TABLET(12.5 MG) BY MOUTH TWICE DAILY   nitroGLYCERIN (NITROSTAT) 0.4 MG SL tablet Place 1 tablet (0.4 mg total) under the tongue every 5 (five) minutes x 3 doses as needed for chest pain.   [DISCONTINUED] insulin glargine (LANTUS SOLOSTAR) 100 UNIT/ML Solostar Pen INJECT 30 UNITS UNDER THE SKIN DAILY   [DISCONTINUED] ramipril (ALTACE) 10 MG capsule TAKE 1 CAPSULE(10 MG) BY MOUTH DAILY       01/21/2024   10:06 AM 08/19/2023    1:28 PM 05/05/2023    1:22 PM 12/30/2022   10:45 AM  GAD 7 : Generalized Anxiety Score  Nervous, Anxious, on Edge 3 1 1 1   Control/stop worrying 3 1 1 1   Worry too much - different  things 2 1 1 1   Trouble relaxing 1 1 1  0  Restless 1 1 0 0  Easily annoyed or irritable 0 0 0 0  Afraid - awful might happen 1 1 1 1   Total GAD 7 Score 11 6 5 4   Anxiety Difficulty Somewhat difficult Somewhat difficult Not difficult at all Not difficult at all       01/21/2024   10:05 AM 01/13/2024    9:34 AM 08/19/2023    1:28 PM  Depression screen PHQ 2/9  Decreased Interest 2 1 1   Down, Depressed, Hopeless 2 2 1   PHQ - 2 Score 4 3 2   Altered sleeping 2 0 1  Tired, decreased energy 2 2 1   Change in appetite 0 0 1  Feeling bad or failure about yourself  0 0 1  Trouble concentrating 0 0 1  Moving slowly or fidgety/restless 0 0 1  Suicidal thoughts 0 0 0  PHQ-9 Score 8 5 8   Difficult doing work/chores Not difficult at all Not difficult at all Somewhat difficult    BP Readings from Last 3 Encounters:  01/21/24 136/70  08/19/23 112/70  05/05/23 128/70    Physical Exam Vitals and nursing note reviewed.  Constitutional:      General: She is not in acute distress.    Appearance: She is well-developed.  HENT:     Head: Normocephalic and atraumatic.     Right Ear: Tympanic membrane and ear canal normal.     Left Ear: Tympanic membrane and ear  canal normal.     Nose:     Right Sinus: No maxillary sinus tenderness.     Left Sinus: No maxillary sinus tenderness.  Eyes:     General: No scleral icterus.       Right eye: No discharge.        Left eye: No discharge.     Conjunctiva/sclera: Conjunctivae normal.  Neck:     Thyroid: No thyromegaly.     Vascular: No carotid bruit.  Cardiovascular:     Rate and Rhythm: Normal rate and regular rhythm.     Pulses: Normal pulses.     Heart sounds: Normal heart sounds.  Pulmonary:     Effort: Pulmonary effort is normal. No respiratory distress.     Breath sounds: No wheezing.  Abdominal:     General: Bowel sounds are normal.     Palpations: Abdomen is soft.     Tenderness: There is no abdominal tenderness.  Musculoskeletal:     Cervical back: Normal range of motion. No erythema.     Right lower leg: No edema.     Left lower leg: No edema.  Lymphadenopathy:     Cervical: No cervical adenopathy.  Skin:    General: Skin is warm and dry.     Findings: No rash.  Neurological:     Mental Status: She is alert and oriented to person, place, and time.     Cranial Nerves: No cranial nerve deficit.     Sensory: No sensory deficit.     Deep Tendon Reflexes: Reflexes are normal and symmetric.  Psychiatric:        Attention and Perception: Attention normal.        Mood and Affect: Mood normal.     Wt Readings from Last 3 Encounters:  01/21/24 154 lb 2 oz (69.9 kg)  08/19/23 160 lb 6.4 oz (72.8 kg)  05/05/23 156 lb (70.8 kg)    BP 136/70   Pulse  63   Ht 5\' 2"  (1.575 m)   Wt 154 lb 2 oz (69.9 kg)   SpO2 100%   BMI 28.19 kg/m   Assessment and Plan:  Problem List Items Addressed This Visit       Unprioritized   Essential (primary) hypertension (Chronic)   Controlled BP with normal exam. Current regimen is metoprolol and ramipril. Will continue same medications; encourage continued reduced sodium diet.       Relevant Medications   ramipril (ALTACE) 10 MG capsule    Other Relevant Orders   CBC with Differential/Platelet   Comprehensive metabolic panel   Hyperlipidemia associated with type 2 diabetes mellitus (HCC) (Chronic)   LDL is  Lab Results  Component Value Date   LDLCALC 94 12/30/2022   Current regimen is atorvastatin.  Tolerating medications well without issues.       Relevant Medications   insulin glargine (LANTUS SOLOSTAR) 100 UNIT/ML Solostar Pen   ramipril (ALTACE) 10 MG capsule   Other Relevant Orders   Lipid panel   Seizure disorder (HCC) (Chronic)   No seizure activity in years. Will continue current medications without change.      Type II diabetes mellitus with complication (HCC) (Chronic)   Blood sugars stable without hypoglycemic symptoms or events. Currently managed with Insulin. Changes made last visit are none. Lab Results  Component Value Date   HGBA1C 7.6 (H) 08/19/2023         Relevant Medications   insulin glargine (LANTUS SOLOSTAR) 100 UNIT/ML Solostar Pen   ramipril (ALTACE) 10 MG capsule   Other Relevant Orders   Comprehensive metabolic panel   Hemoglobin A1c   TSH   Microalbumin / creatinine urine ratio   Stage 3a chronic kidney disease (CKD) (HCC) (Chronic)   Relevant Orders   CBC with Differential/Platelet   Comprehensive metabolic panel   History of rectal cancer   Last colonoscopy done 2024 - no further exams needed per GI      Other Visit Diagnoses       Annual physical exam    -  Primary   up to date on screening and immunizations     Encounter for screening mammogram for breast cancer       order written [facility] mammo at [facility]     Long-term insulin use (HCC)           Return in about 4 months (around 05/22/2024) for DM, HTN.    Reubin Milan, MD [facility] [place]

## 2024-01-22 ENCOUNTER — Other Ambulatory Visit: Payer: Self-pay | Admitting: Internal Medicine

## 2024-01-22 ENCOUNTER — Encounter: Payer: Self-pay | Admitting: Internal Medicine

## 2024-01-22 DIAGNOSIS — E118 Type 2 diabetes mellitus with unspecified complications: Secondary | ICD-10-CM

## 2024-01-22 LAB — COMPREHENSIVE METABOLIC PANEL
ALT: 10 IU/L (ref 0–32)
AST: 17 IU/L (ref 0–40)
Albumin: 4.1 g/dL (ref 3.8–4.8)
Alkaline Phosphatase: 130 IU/L — ABNORMAL HIGH (ref 44–121)
BUN/Creatinine Ratio: 17 (ref 12–28)
BUN: 18 mg/dL (ref 8–27)
Bilirubin Total: 0.3 mg/dL (ref 0.0–1.2)
CO2: 25 mmol/L (ref 20–29)
Calcium: 9.8 mg/dL (ref 8.7–10.3)
Chloride: 102 mmol/L (ref 96–106)
Creatinine, Ser: 1.08 mg/dL — ABNORMAL HIGH (ref 0.57–1.00)
Globulin, Total: 2 g/dL (ref 1.5–4.5)
Glucose: 87 mg/dL (ref 70–99)
Potassium: 5.2 mmol/L (ref 3.5–5.2)
Sodium: 143 mmol/L (ref 134–144)
Total Protein: 6.1 g/dL (ref 6.0–8.5)
eGFR: 53 mL/min/{1.73_m2} — ABNORMAL LOW (ref >59)

## 2024-01-22 LAB — TSH: TSH: 1.73 u[IU]/mL (ref 0.450–4.500)

## 2024-01-22 LAB — CBC WITH DIFFERENTIAL/PLATELET
Basophils Absolute: 0 10*3/uL (ref 0.0–0.2)
Basos: 0 %
EOS (ABSOLUTE): 0.1 10*3/uL (ref 0.0–0.4)
Eos: 1 %
Hematocrit: 37.9 % (ref 34.0–46.6)
Hemoglobin: 12.1 g/dL (ref 11.1–15.9)
Immature Grans (Abs): 0 10*3/uL (ref 0.0–0.1)
Immature Granulocytes: 0 %
Lymphocytes Absolute: 0.8 10*3/uL (ref 0.7–3.1)
Lymphs: 11 %
MCH: 29.7 pg (ref 26.6–33.0)
MCHC: 31.9 g/dL (ref 31.5–35.7)
MCV: 93 fL (ref 79–97)
Monocytes Absolute: 0.9 10*3/uL (ref 0.1–0.9)
Monocytes: 11 %
Neutrophils Absolute: 6.1 10*3/uL (ref 1.4–7.0)
Neutrophils: 77 %
Platelets: 199 10*3/uL (ref 150–450)
RBC: 4.07 x10E6/uL (ref 3.77–5.28)
RDW: 12.5 % (ref 11.7–15.4)
WBC: 7.9 10*3/uL (ref 3.4–10.8)

## 2024-01-22 LAB — LIPID PANEL
Chol/HDL Ratio: 2.1 ratio (ref 0.0–4.4)
Cholesterol, Total: 181 mg/dL (ref 100–199)
HDL: 85 mg/dL (ref >39)
LDL Chol Calc (NIH): 81 mg/dL (ref 0–99)
Triglycerides: 81 mg/dL (ref 0–149)
VLDL Cholesterol Cal: 15 mg/dL (ref 5–40)

## 2024-01-22 LAB — MICROALBUMIN / CREATININE URINE RATIO
Creatinine, Urine: 44.3 mg/dL
Microalb/Creat Ratio: 12 mg/g{creat} (ref 0–29)
Microalbumin, Urine: 5.1 ug/mL

## 2024-01-22 LAB — HEMOGLOBIN A1C
Est. average glucose Bld gHb Est-mCnc: 192 mg/dL
Hgb A1c MFr Bld: 8.3 % — ABNORMAL HIGH (ref 4.8–5.6)

## 2024-01-22 MED ORDER — ONETOUCH ULTRA VI STRP: ORAL_STRIP | 3 refills | Status: DC

## 2024-01-22 NOTE — Telephone Encounter (Signed)
 Copied from CRM [id number]. Topic: Clinical - Medication Refill >> Jan 22, 2024  9:46 AM [name] wrote: Most Recent Primary Care Visit:  Provider: Reubin Milan  Department: [facility]  Visit Type: PHYSICAL  Date: 01/21/2024  Medication: glucose blood (ONETOUCH ULTRA) test strip  Has the patient contacted their pharmacy? No (Agent: If no, request that the patient contact the pharmacy for the refill. If patient does not wish to contact the pharmacy document the reason why and proceed with request.) (Agent: If yes, when and what did the pharmacy advise?)  Is this the correct pharmacy for this prescription? Yes If no, delete pharmacy and type the correct one.  This is the patient's preferred pharmacy:    [facility] [address] [place] - [address] [place] [address] Phone: [phone] Fax: [phone]    Has the prescription been filled recently? No  Is the patient out of the medication? No on her last one  Has the patient been seen for an appointment in the last year OR does the patient have an upcoming appointment? Yes  Can we respond through MyChart? No  Agent: Please be advised that Rx refills may take up to 3 business days. We ask that you follow-up with your pharmacy.

## 2024-01-22 NOTE — Telephone Encounter (Signed)
 Requested Prescriptions  Pending Prescriptions Disp Refills   glucose blood (ONETOUCH ULTRA) test strip 100 each 3    Sig: Use as instructed     Endocrinology: Diabetes - Testing Supplies Passed - 01/22/2024  4:11 PM      Passed - Valid encounter within last 12 months    Recent Outpatient Visits           5 months ago Type II diabetes mellitus with complication [facility])   [place] [facility] at [facility], Nyoka Cowden, MD   8 months ago Type II diabetes mellitus with complication [facility])   [place] [facility] at [facility], Nyoka Cowden, MD   1 year ago Annual physical exam   [facility] at [facility], Nyoka Cowden, MD   1 year ago Type II diabetes mellitus with complication [facility])   [place] [facility] at [facility], Nyoka Cowden, MD   1 year ago Acute cough   [place] [facility] at [facility], Nyoka Cowden, MD       Future Appointments             In 3 months Judithann Graves, Nyoka Cowden, MD [facility] Primary Care [facility] Sports Medicine at [facility], [facility]

## 2024-01-29 DIAGNOSIS — H6123 Impacted cerumen, bilateral: Secondary | ICD-10-CM | POA: Diagnosis not present

## 2024-01-29 DIAGNOSIS — H6063 Unspecified chronic otitis externa, bilateral: Secondary | ICD-10-CM | POA: Diagnosis not present

## 2024-03-03 DIAGNOSIS — Z7984 Long term (current) use of oral hypoglycemic drugs: Secondary | ICD-10-CM | POA: Diagnosis not present

## 2024-03-03 DIAGNOSIS — I10 Essential (primary) hypertension: Secondary | ICD-10-CM | POA: Diagnosis not present

## 2024-03-03 DIAGNOSIS — E113393 Type 2 diabetes mellitus with moderate nonproliferative diabetic retinopathy without macular edema, bilateral: Secondary | ICD-10-CM | POA: Diagnosis not present

## 2024-03-03 DIAGNOSIS — H524 Presbyopia: Secondary | ICD-10-CM | POA: Diagnosis not present

## 2024-03-03 DIAGNOSIS — H35033 Hypertensive retinopathy, bilateral: Secondary | ICD-10-CM | POA: Diagnosis not present

## 2024-03-07 LAB — HM DIABETES EYE EXAM

## 2024-03-29 ENCOUNTER — Other Ambulatory Visit: Payer: Self-pay | Admitting: Internal Medicine

## 2024-03-29 DIAGNOSIS — E1169 Type 2 diabetes mellitus with other specified complication: Secondary | ICD-10-CM

## 2024-03-29 DIAGNOSIS — E785 Hyperlipidemia, unspecified: Secondary | ICD-10-CM

## 2024-03-31 NOTE — Telephone Encounter (Signed)
 Requested Prescriptions  Pending Prescriptions Disp Refills   atorvastatin  (LIPITOR) 40 MG tablet [Pharmacy Med Name: ATORVASTATIN  40MG  TABLETS] 90 tablet 0    Sig: TAKE 1 TABLET BY MOUTH DAILY     Cardiovascular:  Antilipid - Statins Failed - 03/31/2024  9:08 AM      Failed - Lipid Panel in normal range within the last 12 months    Cholesterol, Total  Date Value Ref Range Status  01/21/2024 181 100 - 199 mg/dL Final   LDL Chol Calc (NIH)  Date Value Ref Range Status  01/21/2024 81 0 - 99 mg/dL Final   HDL  Date Value Ref Range Status  01/21/2024 85 >39 mg/dL Final   Triglycerides  Date Value Ref Range Status  01/21/2024 81 0 - 149 mg/dL Final         Passed - Patient is not pregnant      Passed - Valid encounter within last 12 months    Recent Outpatient Visits           2 months ago Annual physical exam   [facility] at [facility], Chales [place], MD       Future Appointments             In 1 month Gala Jubilee, [place], MD [facility] Primary Care & [facility] Medicine at [facility], [facility]

## 2024-03-31 NOTE — Telephone Encounter (Signed)
 Requested Prescriptions  Pending Prescriptions Disp Refills   metoprolol  succinate (TOPROL -XL) 25 MG 24 hr tablet [Pharmacy Med Name: METOPROLOL  ER SUCCINATE 25MG  TABS] 90 tablet 0    Sig: TAKE 1/2 TABLET(12.5 MG) BY MOUTH TWICE DAILY     Cardiovascular:  Beta Blockers Passed - 03/31/2024 10:26 AM      Passed - Last BP in normal range    BP Readings from Last 1 Encounters:  01/21/24 136/70         Passed - Last Heart Rate in normal range    Pulse Readings from Last 1 Encounters:  01/21/24 63         Passed - Valid encounter within last 6 months    Recent Outpatient Visits           2 months ago Annual physical exam   [facility] Medicine at [facility], Chales [place], MD       Future Appointments             In 1 month Gala Jubilee, Ch[place], MD [facility] Primary Care & Sports Medicine at [facility], [facility]

## 2024-05-11 DIAGNOSIS — B351 Tinea unguium: Secondary | ICD-10-CM | POA: Diagnosis not present

## 2024-05-11 DIAGNOSIS — E119 Type 2 diabetes mellitus without complications: Secondary | ICD-10-CM | POA: Diagnosis not present

## 2024-05-11 DIAGNOSIS — L851 Acquired keratosis [keratoderma] palmaris et plantaris: Secondary | ICD-10-CM | POA: Diagnosis not present

## 2024-05-19 ENCOUNTER — Encounter: Payer: Self-pay | Admitting: Internal Medicine

## 2024-05-19 ENCOUNTER — Ambulatory Visit (INDEPENDENT_AMBULATORY_CARE_PROVIDER_SITE_OTHER): Admitting: Internal Medicine

## 2024-05-19 VITALS — BP 122/66 | HR 59 | Ht 62.0 in | Wt 156.0 lb

## 2024-05-19 DIAGNOSIS — R0609 Other forms of dyspnea: Secondary | ICD-10-CM | POA: Diagnosis not present

## 2024-05-19 DIAGNOSIS — E118 Type 2 diabetes mellitus with unspecified complications: Secondary | ICD-10-CM

## 2024-05-19 DIAGNOSIS — Z794 Long term (current) use of insulin: Secondary | ICD-10-CM | POA: Diagnosis not present

## 2024-05-19 DIAGNOSIS — I10 Essential (primary) hypertension: Secondary | ICD-10-CM | POA: Diagnosis not present

## 2024-05-19 DIAGNOSIS — M160 Bilateral primary osteoarthritis of hip: Secondary | ICD-10-CM

## 2024-05-19 LAB — POCT GLYCOSYLATED HEMOGLOBIN (HGB A1C): Hemoglobin A1C: 7.6 % — AB (ref 4.0–5.6)

## 2024-05-19 MED ORDER — DEXCOM G7 SENSOR MISC: 1.0000 | 0 refills | Status: DC

## 2024-05-19 MED ORDER — FREESTYLE LIBRE 3 PLUS SENSOR MISC: 1.0000 | 0 refills | Status: DC

## 2024-05-19 NOTE — Patient Instructions (Signed)
 Team Member Role and Visual merchandiser Info Address Start End Comments  Paraschos, Marsa, MD Consulting Physician (Cardiology) Phone: [phone] Fax: [phone] [address] [facility] [place] [address] 08/03/2018 - -

## 2024-05-19 NOTE — Assessment & Plan Note (Signed)
 Blood pressure is well controlled.  Current medications are metoprolol  and ramipril . Will continue same regimen along with efforts to limit dietary sodium.

## 2024-05-19 NOTE — Assessment & Plan Note (Signed)
 Blood sugars have been stable.  No recent hypoglycemic events requiring assistance. Currently medications are insulin . Lab Results  Component Value Date   HGBA1C 7.6 (A) 05/19/2024   Last visit dose was increased for A1C 8.3 A1c improved today. Sample and Rx for Free Style Libre 3plus

## 2024-05-19 NOTE — Assessment & Plan Note (Signed)
 Now using cane for ambulation.  No falls. More discomfort with any distance.  Takes Tylenol . No recent imaging - can consider if worsening Will give handicapped parking application

## 2024-05-19 NOTE — Progress Notes (Signed)
 Date:  05/19/2024   Name:  [name]   DOB:  [birth year]   MRN:  [id number]   Chief Complaint: Diabetes, Hypertension, and Hip Pain (X1 month,Left hip, carries a can for support )  Diabetes She presents for her follow-up diabetic visit. She has type 2 diabetes mellitus. Her disease course has been improving. Hypoglycemia symptoms include nervousness/anxiousness. Pertinent negatives for hypoglycemia include no dizziness, headaches, sleepiness or tremors. Associated symptoms include weakness. Pertinent negatives for diabetes include no chest pain, no fatigue, no foot paresthesias, no foot ulcerations and no polyuria. There are no hypoglycemic complications. Symptoms are improving. Diabetic complications include PVD. Pertinent negatives for diabetic complications include no CVA. Current diabetic treatment includes insulin  injections. She is compliant with treatment all of the time. Her weight is stable. Her home blood glucose trend is decreasing steadily.  Hypertension This is a chronic problem. The problem is controlled. Associated symptoms include shortness of breath. Pertinent negatives include no chest pain, headaches, orthopnea, palpitations or peripheral edema. Hypertensive end-organ damage includes kidney disease and PVD. There is no history of CAD/MI or CVA.  Hip Pain  There was no injury mechanism. The pain has been Intermittent since onset. Associated symptoms include an inability to bear weight (uses cane). Pertinent negatives include no muscle weakness, numbness or tingling. The symptoms are aggravated by weight bearing. She has tried acetaminophen , heat, ice and rest for the symptoms. The treatment provided moderate relief.  Weakness - she has episodes about once per day of feeling weak, maybe some minor palpitations and shortness of breath, feels like she just needs to sleep.  No chest pain, dizziness, headache.  She has not been seen by cardiology in some time.  Last evaluation with  ECHO and NM Myocardial stress test in 2019; Carotid US  in 2020 - no significant calcifications.  Review of Systems  Constitutional:  Negative for chills, fatigue and unexpected weight change.  HENT:  Negative for trouble swallowing.   Eyes:  Negative for visual disturbance.  Respiratory:  Positive for shortness of breath. Negative for cough, chest tightness and wheezing.   Cardiovascular:  Negative for chest pain, palpitations, orthopnea and leg swelling.  Gastrointestinal:  Negative for abdominal pain, constipation and diarrhea.  Endocrine: Negative for polyuria.  Musculoskeletal:  Positive for arthralgias and gait problem. Negative for myalgias.  Neurological:  Positive for weakness. Negative for dizziness, tingling, tremors, light-headedness, numbness and headaches.  Psychiatric/Behavioral:  Negative for dysphoric mood and sleep disturbance. The patient is nervous/anxious.      Lab Results  Component Value Date   NA 143 01/21/2024   K 5.2 01/21/2024   CO2 25 01/21/2024   GLUCOSE 87 01/21/2024   BUN 18 01/21/2024   CREATININE 1.08 (H) 01/21/2024   CALCIUM  9.8 01/21/2024   EGFR 53 (L) 01/21/2024   GFRNONAA >60 04/23/2021   Lab Results  Component Value Date   CHOL 181 01/21/2024   HDL 85 01/21/2024   LDLCALC 81 01/21/2024   TRIG 81 01/21/2024   CHOLHDL 2.1 01/21/2024   Lab Results  Component Value Date   TSH 1.730 01/21/2024   Lab Results  Component Value Date   HGBA1C 7.6 (A) 05/19/2024   Lab Results  Component Value Date   WBC 7.9 01/21/2024   HGB 12.1 01/21/2024   HCT 37.9 01/21/2024   MCV 93 01/21/2024   PLT 199 01/21/2024   Lab Results  Component Value Date   ALT 10 01/21/2024   AST 17 01/21/2024  ALKPHOS 130 (H) 01/21/2024   BILITOT 0.3 01/21/2024   Lab Results  Component Value Date   VD25OH 35.3 12/25/2020     Patient Active Problem List   Diagnosis Date Noted   Gait disturbance 05/05/2023   Stage 3a chronic kidney disease (CKD) (HCC)  12/30/2022   Trigger finger, left ring finger 12/27/2021   Primary osteoarthritis of both hips 06/10/2019   Bilateral carotid artery stenosis 08/24/2018   Atherosclerosis of aorta (HCC) 08/24/2018   Type II diabetes mellitus with complication (HCC) 08/18/2018   Bradycardia 07/24/2018   Neoplasm of uncertain behavior of skin 01/16/2017   Senile ecchymosis 08/20/2015   Essential (primary) hypertension 05/08/2015   History of rectal cancer 05/08/2015   Hyperlipidemia associated with type 2 diabetes mellitus (HCC) 05/08/2015   Seizure disorder (HCC) 05/08/2015   Shoulder strain 05/08/2015   Avitaminosis D 05/08/2015    Allergies  Allergen Reactions   Baclofen  Other (See Comments)    Hallucinations   Pioglitazone Nausea Only   Jardiance  [Empagliflozin ] Itching    Yeast vaginitis    Past Surgical History:  Procedure Laterality Date   CATARACT EXTRACTION, BILATERAL  01/2020   Dr. Fleeta [facility]   COLOSTOMY REVERSAL  01/2014   ILEOSTOMY  05/2013   UMBILICAL HERNIA REPAIR  05/2018    Social History   Tobacco Use   Smoking status: Never   Smokeless tobacco: Never  Vaping Use   Vaping status: Never Used  Substance Use Topics   Alcohol use: No    Alcohol/week: 0.0 standard drinks of alcohol   Drug use: No     Medication list has been reviewed and updated.  Current Meds  Medication Sig   aspirin  81 MG chewable tablet Chew 81 mg by mouth daily.    atorvastatin  (LIPITOR) 40 MG tablet TAKE 1 TABLET BY MOUTH DAILY   Bacillus Coagulans-Inulin (PROBIOTIC) 1-250 BILLION-MG CAPS Take 1 capsule by mouth daily.   Blood Glucose Monitoring Suppl DEVI 1 each by Does not apply route in the morning, at noon, and at bedtime. May substitute to any manufacturer covered by patient's insurance.   calcium  carbonate (OSCAL) 1500 (600 Ca) MG TABS tablet Take 600 mg of elemental calcium  by mouth daily.   carbamazepine  (TEGRETOL ) 200 MG tablet TAKE 1 TABLET BY MOUTH DAILY   Cholecalciferol  25 MCG (1000  UT) capsule Take 1,000 Units by mouth daily.    Continuous Glucose Sensor (FREESTYLE LIBRE 3 PLUS SENSOR) MISC Inject 1 each into the skin continuous. Change sensor every 15 days.   docusate sodium  (COLACE) 250 MG capsule Take 250 mg by mouth daily.   Fluocinolone Acetonide 0.01 % OIL Place 1 drop into both ears at bedtime. Alternating ears each night   glucose blood (ONETOUCH ULTRA) test strip Use as instructed   Glycerin (OASIS TEARS PF OP) Apply to eye.   insulin  glargine (LANTUS  SOLOSTAR) 100 UNIT/ML Solostar Pen INJECT 20 u qAM and 15 u qPM   metoprolol  succinate (TOPROL -XL) 25 MG 24 hr tablet TAKE 1/2 TABLET(12.5 MG) BY MOUTH TWICE DAILY   nitroGLYCERIN  (NITROSTAT ) 0.4 MG SL tablet Place 1 tablet (0.4 mg total) under the tongue every 5 (five) minutes x 3 doses as needed for chest pain.   pantoprazole  (PROTONIX ) 40 MG tablet Take 1 tablet (40 mg total) by mouth daily as needed (heartburn).   ramipril  (ALTACE ) 10 MG capsule Take 1 capsule (10 mg total) by mouth daily.   [DISCONTINUED] Continuous Glucose Sensor (DEXCOM G7 SENSOR) MISC 1 each by  Does not apply route continuous.       05/19/2024   10:11 AM 01/21/2024   10:06 AM 08/19/2023    1:28 PM 05/05/2023    1:22 PM  GAD 7 : Generalized Anxiety Score  Nervous, Anxious, on Edge 3 3 1 1   Control/stop worrying 3 3 1 1   Worry too much - different things 2 2 1 1   Trouble relaxing 1 1 1 1   Restless 1 1 1  0  Easily annoyed or irritable 0 0 0 0  Afraid - awful might happen 1 1 1 1   Total GAD 7 Score 11 11 6 5   Anxiety Difficulty Somewhat difficult Somewhat difficult Somewhat difficult Not difficult at all       05/19/2024   10:11 AM 01/21/2024   10:05 AM 01/13/2024    9:34 AM  Depression screen PHQ 2/9  Decreased Interest 1 2 1   Down, Depressed, Hopeless 1 2 2   PHQ - 2 Score 2 4 3   Altered sleeping 2 2 0  Tired, decreased energy 2 2 2   Change in appetite 0 0 0  Feeling bad or failure about yourself  0 0 0  Trouble concentrating 0 0 0   Moving slowly or fidgety/restless 0 0 0  Suicidal thoughts 0 0 0  PHQ-9 Score 6 8 5   Difficult doing work/chores Not difficult at all Not difficult at all Not difficult at all    BP Readings from Last 3 Encounters:  05/19/24 122/66  01/21/24 136/70  08/19/23 112/70    Physical Exam Vitals and nursing note reviewed.  Constitutional:      General: She is not in acute distress.    Appearance: She is well-developed.  HENT:     Head: Normocephalic and atraumatic.  Cardiovascular:     Rate and Rhythm: Normal rate and regular rhythm.     Pulses: Normal pulses.     Heart sounds: No murmur heard. Pulmonary:     Effort: Pulmonary effort is normal. No respiratory distress.     Breath sounds: No wheezing or rhonchi.  Musculoskeletal:     Right hip: No tenderness, bony tenderness or crepitus. Normal range of motion.     Left hip: Tenderness and bony tenderness present. No crepitus. Decreased range of motion.     Right lower leg: No edema.     Left lower leg: No edema.  Skin:    General: Skin is warm and dry.     Capillary Refill: Capillary refill takes less than 2 seconds.     Findings: No rash.  Neurological:     General: No focal deficit present.     Mental Status: She is alert and oriented to person, place, and time.     Gait: Gait abnormal (uses cane).  Psychiatric:        Mood and Affect: Mood normal.        Behavior: Behavior normal.     Wt Readings from Last 3 Encounters:  05/19/24 156 lb (70.8 kg)  01/21/24 154 lb 2 oz (69.9 kg)  08/19/23 160 lb 6.4 oz (72.8 kg)    BP 122/66   Pulse (!) 59   Ht 5' 2 (1.575 m)   Wt 156 lb (70.8 kg)   SpO2 99%   BMI 28.53 kg/m   Assessment and Plan:  Problem List Items Addressed This Visit       Unprioritized   Essential (primary) hypertension (Chronic)   Blood pressure is well controlled.  Current medications are  metoprolol  and ramipril . Will continue same regimen along with efforts to limit dietary sodium.        Type II diabetes mellitus with complication (HCC) - Primary (Chronic)   Blood sugars have been stable.  No recent hypoglycemic events requiring assistance. Currently medications are insulin . Lab Results  Component Value Date   HGBA1C 7.6 (A) 05/19/2024   Last visit dose was increased for A1C 8.3 A1c improved today. Sample and Rx for Free Style Libre 3plus        Relevant Medications   Continuous Glucose Sensor (FREESTYLE LIBRE 3 PLUS SENSOR) MISC   Other Relevant Orders   POCT HgB A1C (Completed)   Primary osteoarthritis of both hips   Now using cane for ambulation.  No falls. More discomfort with any distance.  Takes Tylenol . No recent imaging - can consider if worsening Will give handicapped parking application      Other Visit Diagnoses       Long-term insulin  use (HCC)         DOE (dyspnea on exertion)       mild with weakness unspecified. mild bradycardia not likely the cause will refer back to her cardiologist - Dr. Ammon   Relevant Orders   Ambulatory referral to Cardiology       No follow-ups on file.    Leita HILARIO Adie, MD [facility] [place]

## 2024-06-01 ENCOUNTER — Telehealth: Payer: Self-pay

## 2024-06-01 NOTE — Telephone Encounter (Signed)
 Copied from CRM [id number]. Topic: Clinical - Prescription Issue >> Jun 01, 2024  3:14 PM Tiffany S wrote: Reason for CRM: CMN form needs to be completed for the pharmacy in order for the patient to get the sensor please follow up with patient

## 2024-06-02 DIAGNOSIS — H6123 Impacted cerumen, bilateral: Secondary | ICD-10-CM | POA: Diagnosis not present

## 2024-06-02 DIAGNOSIS — J300 Vasomotor rhinitis: Secondary | ICD-10-CM | POA: Diagnosis not present

## 2024-06-02 NOTE — Telephone Encounter (Signed)
 Patient returning Jessenia's call...transferred to CAL

## 2024-06-02 NOTE — Telephone Encounter (Signed)
 LMOM for patient to call back for clarification.   JM

## 2024-06-02 NOTE — Telephone Encounter (Signed)
 Spoke with patient and she informed me that the pharmacist told her she needed a CMN for her [place] sensors. Called [facility] pharm in [place],  [place] with Todd Basil tech who placed me on hold to speak with Pharmacist to get clarification on what exactly patient needed to get her [employer]. Held for over 15 minutes spoke with Curtistine he informed me that they normally bill medicare part B but needed to try and bill part D. Now it is being covered at zero cost and they do not have any in right now , but have ordered for her and will have available tomorrow for pick up. Called Aalliyah back and [facility] for patient informing her of this.  JM

## 2024-06-25 ENCOUNTER — Other Ambulatory Visit: Payer: Self-pay | Admitting: Internal Medicine

## 2024-06-27 ENCOUNTER — Other Ambulatory Visit: Payer: Self-pay | Admitting: Internal Medicine

## 2024-06-27 DIAGNOSIS — G40909 Epilepsy, unspecified, not intractable, without status epilepticus: Secondary | ICD-10-CM

## 2024-06-27 DIAGNOSIS — E1169 Type 2 diabetes mellitus with other specified complication: Secondary | ICD-10-CM

## 2024-06-28 NOTE — Telephone Encounter (Signed)
 Requested Prescriptions  Pending Prescriptions Disp Refills   metoprolol  succinate (TOPROL -XL) 25 MG 24 hr tablet [Pharmacy Med Name: METOPROLOL  ER SUCCINATE 25MG  TABS] 90 tablet 1    Sig: TAKE 1/2 TABLET(12.5 MG) BY MOUTH TWICE DAILY     Cardiovascular:  Beta Blockers Passed - 06/28/2024  5:22 PM      Passed - Last BP in normal range    BP Readings from Last 1 Encounters:  05/19/24 122/66         Passed - Last Heart Rate in normal range    Pulse Readings from Last 1 Encounters:  05/19/24 (!) 59         Passed - Valid encounter within last 6 months    Recent Outpatient Visits           1 month ago Type II diabetes mellitus with complication (HCC)   [place] [facility] at [facility], Leita DEL, MD   5 months ago Annual physical exam   [facility] at [facility], Leita DEL, MD

## 2024-06-30 NOTE — Telephone Encounter (Signed)
 Requested medications are due for refill today.  yes  Requested medications are on the active medications list.  yes  Last refill. 10/14/2023 #90 2 rf  Future visit scheduled.   yes  Notes to clinic.  Labs are expired.    Requested Prescriptions  Pending Prescriptions Disp Refills   carbamazepine  (TEGRETOL ) 200 MG tablet [Pharmacy Med Name: CARBAMAZEPINE  200MG  TABLETS] 90 tablet 2    Sig: TAKE 1 TABLET BY MOUTH DAILY     Neurology:  Anticonvulsants - carbamazepine  Failed - 06/30/2024  1:21 PM      Failed - Carbamazepine  (serum) in normal range and within 360 days    Carbamazepine  (Tegretol ), S  Date Value Ref Range Status  12/30/2022 6.3 4.0 - 12.0 ug/mL Final    Comment:             In conjunction with other antiepileptic drugs                                Therapeutic  4.0 -  8.0                                Toxicity     9.0 - 12.0                                    Carbamazepine  alone                                Therapeutic  8.0 - 12.0                                 Detection Limit =  2.0                           <2.0 indicates None Detected          Failed - Cr in normal range and within 360 days    Creatinine  Date Value Ref Range Status  05/04/2013 1.01 0.60 - 1.30 mg/dL Final   Creatinine, Ser  Date Value Ref Range Status  01/21/2024 1.08 (H) 0.57 - 1.00 mg/dL Final         Passed - AST in normal range and within 360 days    AST  Date Value Ref Range Status  01/21/2024 17 0 - 40 IU/L Final   SGOT(AST)  Date Value Ref Range Status  05/04/2013 11 (L) 15 - 37 Unit/L Final         Passed - ALT in normal range and within 360 days    ALT  Date Value Ref Range Status  01/21/2024 10 0 - 32 IU/L Final   SGPT (ALT)  Date Value Ref Range Status  05/04/2013 19 12 - 78 U/L Final         Passed - WBC in normal range and within 360 days    WBC  Date Value Ref Range Status  01/21/2024 7.9 3.4 - 10.8 x10E3/uL Final  04/23/2021 8.0 4.0 - 10.5 K/uL Final          Passed - PLT in normal range and within 360 days    Platelets  Date  Value Ref Range Status  01/21/2024 199 150 - 450 x10E3/uL Final         Passed - HGB in normal range and within 360 days    Hemoglobin  Date Value Ref Range Status  01/21/2024 12.1 11.1 - 15.9 g/dL Final         Passed - Na in normal range and within 360 days    Sodium  Date Value Ref Range Status  01/21/2024 143 134 - 144 mmol/L Final  04/13/2013 136 136 - 145 mmol/L Final         Passed - HCT in normal range and within 360 days    Hematocrit  Date Value Ref Range Status  01/21/2024 37.9 34.0 - 46.6 % Final         Passed - Completed PHQ-2 or PHQ-9 in the last 360 days      Passed - Valid encounter within last 12 months    Recent Outpatient Visits           1 month ago Type II diabetes mellitus with complication [facility])   [place] [facility] at [facility], Leita DEL, MD   5 months ago Annual physical exam   [place] [facility] at [facility], Leita DEL, MD              Signed Prescriptions Disp Refills   atorvastatin  (LIPITOR) 40 MG tablet 90 tablet 1    Sig: TAKE 1 TABLET BY MOUTH DAILY     Cardiovascular:  Antilipid - Statins Failed - 06/30/2024  1:21 PM      Failed - Lipid Panel in normal range within the last 12 months    Cholesterol, Total  Date Value Ref Range Status  01/21/2024 181 100 - 199 mg/dL Final   LDL Chol Calc (NIH)  Date Value Ref Range Status  01/21/2024 81 0 - 99 mg/dL Final   HDL  Date Value Ref Range Status  01/21/2024 85 >39 mg/dL Final   Triglycerides  Date Value Ref Range Status  01/21/2024 81 0 - 149 mg/dL Final         Passed - Patient is not pregnant      Passed - Valid encounter within last 12 months    Recent Outpatient Visits           1 month ago Type II diabetes mellitus with complication [facility])   [place] [facility] at [facility], Leita DEL, MD   5 months ago Annual physical exam   [facility] at [facility], Leita DEL, MD

## 2024-06-30 NOTE — Telephone Encounter (Signed)
 Requested Prescriptions  Pending Prescriptions Disp Refills   carbamazepine  (TEGRETOL ) 200 MG tablet [Pharmacy Med Name: CARBAMAZEPINE  200MG  TABLETS] 90 tablet 2    Sig: TAKE 1 TABLET BY MOUTH DAILY     Neurology:  Anticonvulsants - carbamazepine  Failed - 06/30/2024  1:20 PM      Failed - Carbamazepine  (serum) in normal range and within 360 days    Carbamazepine  (Tegretol ), S  Date Value Ref Range Status  12/30/2022 6.3 4.0 - 12.0 ug/mL Final    Comment:             In conjunction with other antiepileptic drugs                                Therapeutic  4.0 -  8.0                                Toxicity     9.0 - 12.0                                    Carbamazepine  alone                                Therapeutic  8.0 - 12.0                                 Detection Limit =  2.0                           <2.0 indicates None Detected          Failed - Cr in normal range and within 360 days    Creatinine  Date Value Ref Range Status  05/04/2013 1.01 0.60 - 1.30 mg/dL Final   Creatinine, Ser  Date Value Ref Range Status  01/21/2024 1.08 (H) 0.57 - 1.00 mg/dL Final         Passed - AST in normal range and within 360 days    AST  Date Value Ref Range Status  01/21/2024 17 0 - 40 IU/L Final   SGOT(AST)  Date Value Ref Range Status  05/04/2013 11 (L) 15 - 37 Unit/L Final         Passed - ALT in normal range and within 360 days    ALT  Date Value Ref Range Status  01/21/2024 10 0 - 32 IU/L Final   SGPT (ALT)  Date Value Ref Range Status  05/04/2013 19 12 - 78 U/L Final         Passed - WBC in normal range and within 360 days    WBC  Date Value Ref Range Status  01/21/2024 7.9 3.4 - 10.8 x10E3/uL Final  04/23/2021 8.0 4.0 - 10.5 K/uL Final         Passed - PLT in normal range and within 360 days    Platelets  Date Value Ref Range Status  01/21/2024 199 150 - 450 x10E3/uL Final         Passed - HGB in normal range and within 360 days    Hemoglobin  Date Value  Ref Range Status  01/21/2024 12.1 11.1 -  15.9 g/dL Final         Passed - Na in normal range and within 360 days    Sodium  Date Value Ref Range Status  01/21/2024 143 134 - 144 mmol/L Final  04/13/2013 136 136 - 145 mmol/L Final         Passed - HCT in normal range and within 360 days    Hematocrit  Date Value Ref Range Status  01/21/2024 37.9 34.0 - 46.6 % Final         Passed - Completed PHQ-2 or PHQ-9 in the last 360 days      Passed - Valid encounter within last 12 months    Recent Outpatient Visits           1 month ago Type II diabetes mellitus with complication [facility])   [place] [facility] at [facility], Leita DEL, MD   5 months ago Annual physical exam   [facility] at [facility], Leita DEL, MD               atorvastatin  (LIPITOR) 40 MG tablet [Pharmacy Med Name: ATORVASTATIN  40MG  TABLETS] 90 tablet 1    Sig: TAKE 1 TABLET BY MOUTH DAILY     Cardiovascular:  Antilipid - Statins Failed - 06/30/2024  1:20 PM      Failed - Lipid Panel in normal range within the last 12 months    Cholesterol, Total  Date Value Ref Range Status  01/21/2024 181 100 - 199 mg/dL Final   LDL Chol Calc (NIH)  Date Value Ref Range Status  01/21/2024 81 0 - 99 mg/dL Final   HDL  Date Value Ref Range Status  01/21/2024 85 >39 mg/dL Final   Triglycerides  Date Value Ref Range Status  01/21/2024 81 0 - 149 mg/dL Final         Passed - Patient is not pregnant      Passed - Valid encounter within last 12 months    Recent Outpatient Visits           1 month ago Type II diabetes mellitus with complication [facility])   [place] [facility] at [facility], Leita DEL, MD   5 months ago Annual physical exam   [facility] at [facility], Leita DEL, MD

## 2024-07-06 DIAGNOSIS — R001 Bradycardia, unspecified: Secondary | ICD-10-CM | POA: Diagnosis not present

## 2024-07-06 DIAGNOSIS — E119 Type 2 diabetes mellitus without complications: Secondary | ICD-10-CM | POA: Diagnosis not present

## 2024-07-06 DIAGNOSIS — I10 Essential (primary) hypertension: Secondary | ICD-10-CM | POA: Diagnosis not present

## 2024-07-06 DIAGNOSIS — R0602 Shortness of breath: Secondary | ICD-10-CM | POA: Diagnosis not present

## 2024-07-06 DIAGNOSIS — E785 Hyperlipidemia, unspecified: Secondary | ICD-10-CM | POA: Diagnosis not present

## 2024-07-12 DIAGNOSIS — R0602 Shortness of breath: Secondary | ICD-10-CM | POA: Diagnosis not present

## 2024-07-19 DIAGNOSIS — Z1231 Encounter for screening mammogram for malignant neoplasm of breast: Secondary | ICD-10-CM | POA: Diagnosis not present

## 2024-07-19 DIAGNOSIS — R92313 Mammographic fatty tissue density, bilateral breasts: Secondary | ICD-10-CM | POA: Diagnosis not present

## 2024-07-19 LAB — HM MAMMOGRAPHY

## 2024-07-20 ENCOUNTER — Other Ambulatory Visit: Payer: Self-pay | Admitting: Internal Medicine

## 2024-07-20 DIAGNOSIS — I10 Essential (primary) hypertension: Secondary | ICD-10-CM

## 2024-07-21 NOTE — Telephone Encounter (Signed)
 Requested Prescriptions  Pending Prescriptions Disp Refills   ramipril  (ALTACE ) 10 MG capsule [Pharmacy Med Name: RAMIPRIL  10MG  CAPSULES] 90 capsule 0    Sig: TAKE 1 CAPSULE(10 MG) BY MOUTH DAILY     Cardiovascular:  ACE Inhibitors Failed - 07/21/2024  1:12 PM      Failed - Cr in normal range and within 180 days    Creatinine  Date Value Ref Range Status  05/04/2013 1.01 0.60 - 1.30 mg/dL Final   Creatinine, Ser  Date Value Ref Range Status  01/21/2024 1.08 (H) 0.57 - 1.00 mg/dL Final         Failed - K in normal range and within 180 days    Potassium  Date Value Ref Range Status  01/21/2024 5.2 3.5 - 5.2 mmol/L Final  05/04/2013 4.0 3.5 - 5.1 mmol/L Final         Passed - Patient is not pregnant      Passed - Last BP in normal range    BP Readings from Last 1 Encounters:  05/19/24 122/66         Passed - Valid encounter within last 6 months    Recent Outpatient Visits           2 months ago Type II diabetes mellitus with complication [facility])   [place] [facility] at [facility], Leita DEL, MD   6 months ago Annual physical exam   [facility] at [facility], Leita DEL, MD

## 2024-07-29 ENCOUNTER — Other Ambulatory Visit: Payer: Self-pay | Admitting: Internal Medicine

## 2024-07-29 DIAGNOSIS — E118 Type 2 diabetes mellitus with unspecified complications: Secondary | ICD-10-CM

## 2024-08-01 NOTE — Telephone Encounter (Signed)
 Requested Prescriptions  Pending Prescriptions Disp Refills   Continuous Glucose Sensor (FREESTYLE LIBRE 3 PLUS SENSOR) MISC [Pharmacy Med Name: FREESTYLE LIBRE 3 PLUS SENSR-15 DAY] 2 each 0    Sig: USE AND CHANGE SENSOR EVERY 15 DAYS     There is no refill protocol information for this order

## 2024-08-09 DIAGNOSIS — R001 Bradycardia, unspecified: Secondary | ICD-10-CM | POA: Diagnosis not present

## 2024-08-11 ENCOUNTER — Telehealth: Payer: Self-pay | Admitting: Internal Medicine

## 2024-08-11 NOTE — Telephone Encounter (Signed)
 Please complete PA.    KP

## 2024-08-11 NOTE — Telephone Encounter (Signed)
 Copied from CRM [id number]. Topic: Clinical - Prescription Issue >> Aug 11, 2024 10:11 AM [name] wrote: Reason for CRM: pt called the pharmacy asking about the Glucose sensor [place] and they told her they need to get a new PA for the prescription.  [facility] in [place]

## 2024-08-12 ENCOUNTER — Telehealth: Payer: Self-pay | Admitting: Pharmacy Technician

## 2024-08-12 ENCOUNTER — Other Ambulatory Visit (HOSPITAL_COMMUNITY): Payer: Self-pay

## 2024-08-12 NOTE — Telephone Encounter (Signed)
 Pharmacy Patient Advocate Encounter   Received notification from Pt Calls Messages that prior authorization for Freestyle Libre 3 Plus Sensor is required/requested.   Insurance verification completed.   The patient is insured through [employer] .   Per test claim: The current 30 day co-pay is, $0.00.  No PA needed at this time. This test claim was processed through [facility]- copay amounts may vary at other pharmacies due to pharmacy/plan contracts, or as the patient moves through the different stages of their insurance plan.

## 2024-08-12 NOTE — Telephone Encounter (Signed)
 PA request has been Received. New Encounter has been or will be created for follow up. For additional info see Pharmacy Prior Auth telephone encounter from 08/12/24.

## 2024-08-12 NOTE — Telephone Encounter (Signed)
Called pt she verbalized understanding.  KP

## 2024-08-17 DIAGNOSIS — R0602 Shortness of breath: Secondary | ICD-10-CM | POA: Diagnosis not present

## 2024-08-17 DIAGNOSIS — I10 Essential (primary) hypertension: Secondary | ICD-10-CM | POA: Diagnosis not present

## 2024-08-17 DIAGNOSIS — Z23 Encounter for immunization: Secondary | ICD-10-CM | POA: Diagnosis not present

## 2024-08-17 DIAGNOSIS — R001 Bradycardia, unspecified: Secondary | ICD-10-CM | POA: Diagnosis not present

## 2024-08-17 NOTE — Progress Notes (Signed)
 Established Patient Visit   Chief Complaint: Chief Complaint  Patient presents with  . Bradycardia  . Hypertension   Date of Service: 08/17/2024 Date of Birth: Jun 30, 1946 PCP: Justus Leita Mettle, MD  History of Present Illness: [name] is a 78 y.o.female patient with a history of dizziness, bradycardia, essential hypertension, hyperlipidemia, type II diabetes, vertigo, and rectal cancer.  The patient was admitted to [facility] on 07/24/2018. The patient was in her usual state of health until the morning of admission when she developed acute onset of dizziness, vomiting and diaphoresis.  She checked her blood sugar eventually and it was elevated, and her son took her to the hospital for further evaluation.  In the ER, she was noted to be bradycardic with EKG revealing sinus bradycardia rate of 46 bpm.  She does have a history of sinus bradycardia with an EKG in 04/19/2018 revealing rate of 51 bpm.  Her bisoprolol  was discontinued and her bradycardia improved to 57 bpm.  Admission labs were notable for troponin 0.04, followed by 0.16, 0.10, and 0.07. She underwent Lexiscan  Myoview  on 07/26/2018 which revealed normal left ventricular function without evidence of scar or ischemia.  2D echocardiogram on 07/25/2018 revealed normal left ventricular function, with LVEF 55-60%, with grade 2 diastolic dysfunction.  Her nausea and vomiting resolved and her dizziness improved.  Head CT was negative for acute abnormality. It was suspected that her palpitations were secondary to reflex tachycardia since discontinuing the bisoprolol . She was started on metoprolol  succinate 12.5 mg, with improvement. She had a negative tilt table test per ENT. Neck CTA was performed on 08/18/18 per ENT, which revealed 65% stenosis left ICA and less than 50% stenosis of right ICA. 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 Head CTA revealed severe stenoses anterior and middle cerebral arteries and moderate stenoses posterior cerebral  arteries compatible with atherosclerosis, followed by her primary care provider.   Patient been lost to follow-up since 01/24/2020.  She returns for further evaluation of spells.  She denies chest pain. She has mild chronic exertional shortness of breath, which is unchanged. She has occasional spells of generalized weakness, which resolves with rest, without associated chest pain, shortness of breath, palpitations, vision changes, or near syncope. She denies peripheral edema. She denies palpitations or heart racing.  The patient typically is active for her age, does not do any structured exercise.  Heart rate today 74 bpm.  ECG revealed normal sinus rhythm at 80 bpm with nonspecific ST-T wave abnormalities.  72-hour Holter monitor 07/06/2024 - 07/09/2024 revealed predominant sinus bradycardia, mean heart rate 59 bpm, sinus heart rate range 39 to 104 bpm, occasional premature atrial contractions, infrequent premature ventricular contractions, and infrequent brief runs of SVT longest lasting 5 beats.  2D echocardiogram 07/12/2024 revealed normal left ventricular function, with LVEF greater than 55%.  The patient has essential hypertension, systolic blood pressure mildly elevated today, currently on metoprolol  succinate and ramipril , which are well-tolerated without apparent side effects.  Blood pressures at home are typically in normal range.  The patient has hyperlipidemia, currently on atorvastatin , which is well-tolerated without apparent side effects.  The patient follows a low-cholesterol, low-fat diet.   Past Medical and Surgical History  Past Medical History Past Medical History:  Diagnosis Date  . Arthritis   . Diabetes (CMS/HHS-HCC)   . History of bradycardia   . History of chemotherapy   . History of motion sickness   . History of radiation therapy   . HTN (hypertension)   .  Hyperlipidemia   . Motion sickness   . PONV (postoperative nausea and vomiting) 2014   nauseated and vomiting  for 10 days after surgery  . Rectal cancer (CMS/HHS-HCC) 02/2013  . Seizures (CMS/HHS-HCC)    last seizure 1996  . Vision abnormalities     Past Surgical History She has a past surgical history that includes Colonoscopy (2014); laparoscopic proctectomy w/pullthrough/colonic reservoir/enterostomy (N/A, 06/14/2013); cystourethroscopy w/insertion/exchange ureteral stent (Bilateral, 06/14/2013); sigmoidoscopy flexible (N/A, 01/27/2014); closure enterostomy large/small intestine (N/A, 01/31/2014); colonoscopy in or (N/A, 06/30/2014); surveillance colonoscopy (N/A, 06/12/2017); Diaphragmatic hernia repair (04/2018); extraction cataract extracapsular w/insertion intraocular prosthesis (Right, 01/16/2020); extraction cataract extracapsular w/insertion intraocular prosthesis (Left, 01/30/2020); Hernia repair; surveillance colonoscopy (N/A, 07/27/2020); surveillance colonoscopy (N/A, 10/26/2020); colonoscopy w/removal lesions by snare (N/A, 03/15/2021); colonoscopy w/removal lesions by snare (N/A, 09/13/2021); colonoscopy w/removal lesions by snare (N/A, 04/04/2022); colonoscopy w/biopsy (N/A, 04/04/2022); and colonoscopy w/removal lesions by snare (N/A, 04/10/2023).   Medications and Allergies  Current Medications  Current Outpatient Medications  Medication Sig Dispense Refill  . acetaminophen  (TYLENOL ) 325 MG tablet Take 650 mg by mouth every 4 (four) hours as needed for Pain.    . aspirin  81 MG EC tablet Take 81 mg by mouth once daily    . atorvastatin  (LIPITOR) 40 MG tablet Take 40 mg by mouth nightly.     . bacillus coagulans-inulin 1 billion-250 cell-mg Cap Take 1 capsule by mouth once daily    . blood glucose diagnostic (ONETOUCH ULTRA BLUE TEST STRIP) test strip USE  STRIP TO CHECK GLUCOSE TWICE DAILY    . CALCIUM  CARBONATE (CALCIUM  600 ORAL) Take 1 tablet by mouth once daily.    . carBAMazepine  (TEGRETOL ) 200 mg tablet Take 200 mg by mouth every morning    . cholecalciferol  (VITAMIN D3) 1,000 unit capsule  Take 1,000 Units by mouth once daily    . DEXTRAN 70/HYPROMELLOSE (ARTIFICIAL TEARS, PF, OPHTH) Apply 1 drop to eye daily as needed (dry eyes).    SABRA doxycycline monohydrate 50 MG tablet TAKE 1 TABLET BY MOUTH TWICE DAILY FOR 1 MONTH THEN TAPER TO 1 TABLET DAILY FOR 1 MONTH THEN STOP    . fluocinolone acetonide (DERMOTIC) 0.01 % otic drop APPLY 2 TO 4 DROPS IN EACH EAR EVERY NIGHT AT BEDTIME AS NEEDED FOR DRY SKIN AND ITCHING    . insulin  GLARGINE (LANTUS ) injection (concentration 100 units/mL) Inject subcutaneously 2 (two) times daily 15 units in the morning and 15 units at night    . metoprolol  succinate (TOPROL -XL) 25 MG XL tablet Take 0.5 tablets (12.5 mg total) by mouth 2 (two) times daily 90 tablet 3  . ONETOUCH ULTRA BLUE TEST STRIP test strip USE STRIP TO CHECK GLUCOSE TWICE DAILY  2  . ramipriL  (ALTACE ) 10 MG capsule Take 10 mg by mouth once daily    . STOOL SOFTENER 100 mg capsule Take 100 mg by mouth once daily as needed  0  . trolamine salicylate (ASPERCREME) 10 % cream Apply topically     No current facility-administered medications for this visit.    Allergies: Baclofen  and Actos [pioglitazone]  Social and Family History  Social History  reports that she has never smoked. She has never used smokeless tobacco. She reports that she does not drink alcohol and does not use drugs.  Family History Family History  Problem Relation Name Age of Onset  . Cancer Mother  2       unknown type  . Anesthesia problems Father  PONV  . Esophageal cancer Paternal Uncle    . Breast cancer Cousin  50  . Anesthesia problems Daughter         PONV  . Malignant hypertension Neg Hx    . Malignant hyperthermia Neg Hx    . Ovarian cancer Neg Hx    . Macular degeneration Neg Hx    . Glaucoma Neg Hx      Review of Systems   Review of Systems: The patient denies chest pain, with mild chronic exertional shortness of breath, without orthopnea, paroxysmal nocturnal dyspnea, pedal edema,  palpitations, heart racing, presyncope, syncope, with occasional weakness, with hoarseness, with intermittent muscle cramps, with memory decline.  Review of 10 Systems is negative except as described above.  Physical Examination   Vitals:BP (!) 150/70   Pulse 74   Ht 157.5 cm (5' 2)   Wt 71.2 kg (157 lb)   SpO2 97%   BMI 28.72 kg/m  Ht:157.5 cm (5' 2) Wt:71.2 kg (157 lb) ADJ:Anib surface area is 1.76 meters squared. Body mass index is 28.72 kg/m.  General: Alert and oriented. Well-appearing. No acute distress. HEENT: Pupils equally reactive to light and accomodation    Neck: Supple, no JVD Lungs: Normal effort of breathing; clear to auscultation bilaterally; no wheezes, rales, rhonchi Heart: Regular rate and rhythm. No murmur, rub, or gallop Abdomen: nondistended Extremities: no cyanosis, clubbing, or edema Peripheral Pulses: 2+ radial Skin: Warm, dry, no diaphoresis  Assessment   78 y.o. female with  1. Bradycardia   2. Need for vaccination   3. Primary hypertension   4. SOB (shortness of breath) on exertion   5. Type 2 diabetes mellitus without complication, with long-term current use of insulin  (CMS/HHS-HCC)   6. Hyperlipidemia, unspecified hyperlipidemia type   7. Bilateral carotid artery stenosis    78 year old female with a history of hypertension, type 2 diabetes, hyperlipidemia, vertigo and rectal cancer, previously admitted to [facility] for persistent vertigo, nausea, and vomiting, noted to be bradycardic in the ER with ECG revealing sinus bradycardia at a rate of 46 bpm, which improved to 57 bpm after discontinuing bisoprolol .  Her troponin was borderline elevated without peak or trough, with negative Lexiscan  Myoview , and 2D echocardiogram revealing normal left ventricular function with grade 2 diastolic dysfunction.  After discharge, the patient had some residual dizziness, which resolved after having her ears irrigated, which were impacted with cerumen. She also  reportedly had a negative tilt table test. Neck CTA per ENT revealed 65% stenosis left ICA with less than 50% right ICA stenosis with severe stenosis of right vertebral artery, followed by her primary care provider who recommended ultrasound in 1 year, noted on office note 12/15/2018.  The patient has been lost to follow-up since 01/24/2020, returns for further evaluation of spells.  Heart rate in normal range.  72-hour Holter monitor 07/06/2024 - 07/09/2024 revealed predominant sinus bradycardia with mean heart rate 59 bpm.  2D echocardiogram 07/12/2024 revealed LVEF grained 55%.  Plan   1.  Continue current medications 2.  Counseled patient about low-sodium diet 3.  DASH diet printed instructions given to the patient 4.  Counseled patient about low-cholesterol diet 5.  Continue atorvastatin  for hyperlipidemia management 6.  Low-fat and cholesterol diet print instructions given to the patient 7.  Defer permanent pacemaker implantation at this time, will continue to monitor heart rate 8.  Return to clinic for follow-up in 3 months  No orders of the defined types were placed in this encounter.  Return in about 3 months (around 11/17/2024).  MARSA DOOMS, MD PhD [facility]

## 2024-08-18 ENCOUNTER — Other Ambulatory Visit: Payer: Self-pay

## 2024-08-18 DIAGNOSIS — E118 Type 2 diabetes mellitus with unspecified complications: Secondary | ICD-10-CM

## 2024-08-18 MED ORDER — FREESTYLE LIBRE 3 PLUS SENSOR MISC: 0 refills | Status: DC

## 2024-09-14 ENCOUNTER — Other Ambulatory Visit: Payer: Self-pay | Admitting: Internal Medicine

## 2024-09-14 DIAGNOSIS — E118 Type 2 diabetes mellitus with unspecified complications: Secondary | ICD-10-CM

## 2024-09-16 NOTE — Telephone Encounter (Signed)
 Requested Prescriptions  Pending Prescriptions Disp Refills   insulin  glargine (LANTUS  SOLOSTAR) 100 UNIT/ML Solostar Pen [Pharmacy Med Name: LANTUS  SOLOSTAR PEN INJ 3ML] 30 mL 0    Sig: INJECT 30 UNITS UNDER THE SKIN DAILY     Endocrinology:  Diabetes - Insulins Passed - 09/16/2024 12:44 PM      Passed - HBA1C is between 0 and 7.9 and within 180 days    Hemoglobin A1C  Date Value Ref Range Status  05/19/2024 7.6 (A) 4.0 - 5.6 % Final   Hgb A1c MFr Bld  Date Value Ref Range Status  01/21/2024 8.3 (H) 4.8 - 5.6 % Final    Comment:             Prediabetes: 5.7 - 6.4          Diabetes: >6.4          Glycemic control for adults with diabetes: <7.0          Passed - Valid encounter within last 6 months    Recent Outpatient Visits           4 months ago Type II diabetes mellitus with complication [facility])   [place] [facility] at [facility], Leita DEL, MD   7 months ago Annual physical exam   [facility] at [facility], Leita DEL, MD

## 2024-09-22 ENCOUNTER — Ambulatory Visit: Admitting: Internal Medicine

## 2024-09-22 ENCOUNTER — Encounter: Payer: Self-pay | Admitting: Internal Medicine

## 2024-09-22 VITALS — BP 122/70 | HR 83 | Ht 62.0 in | Wt 159.0 lb

## 2024-09-22 DIAGNOSIS — Z794 Long term (current) use of insulin: Secondary | ICD-10-CM

## 2024-09-22 DIAGNOSIS — Z23 Encounter for immunization: Secondary | ICD-10-CM

## 2024-09-22 DIAGNOSIS — E118 Type 2 diabetes mellitus with unspecified complications: Secondary | ICD-10-CM

## 2024-09-22 LAB — POCT GLYCOSYLATED HEMOGLOBIN (HGB A1C): Hemoglobin A1C: 7.8 % — AB (ref 4.0–5.6)

## 2024-09-22 MED ORDER — LANTUS SOLOSTAR 100 UNIT/ML ~~LOC~~ SOPN: PEN_INJECTOR | SUBCUTANEOUS | 2 refills | Status: AC

## 2024-09-22 MED ORDER — FREESTYLE LIBRE 3 PLUS SENSOR MISC: 0 refills | Status: DC

## 2024-09-22 NOTE — Assessment & Plan Note (Addendum)
 Currently medications are insulin  injections.  Occasional nocturnal hypoglycemic episodes noted. She has reduced her PM insulin  dose to 14 units.  AM dose is 20 units. Last visit medical regimen changes were none. Lab Results  Component Value Date   HGBA1C 7.6 (A) 05/19/2024  A1C = 7.8 today.  Will continue current insulin  dose. She may start using [employer] for her CGM supplies.

## 2024-09-22 NOTE — Patient Instructions (Signed)
 Ms. Thomaston,  We hope you enjoyed your visit with our office! Your feedback means so much to our team, and it helps us  to continue providing the best care possible. If you had a positive experience, we'd love if you could share it by leaving us  a [employer] Review and also completing our patient survey that you'll receive soon.  Your kind words not only brighten our day but also help other patients feel confident in choosing our office for their care.  Thank you for being a part of our practice family!   Dr. Leita Adie & Eual Grieves, CMA, [facility]  Primary Care & Sports Medicine [facility] [address]  [place] [address] Office [phone]  Fax: [phone]'

## 2024-09-22 NOTE — Progress Notes (Signed)
 Date:  09/22/2024   Name:  [name]   DOB:  Aug 06, 1946   MRN:  [id number]   Chief Complaint: Diabetes  Diabetes She presents for her follow-up diabetic visit. She has type 2 diabetes mellitus. Her disease course has been stable. Pertinent negatives for hypoglycemia include no dizziness, headaches or nervousness/anxiousness. Pertinent negatives for diabetes include no chest pain and no fatigue.    Review of Systems  Constitutional:  Negative for chills, fatigue and fever.  Respiratory:  Negative for chest tightness and shortness of breath.   Cardiovascular:  Negative for chest pain and palpitations.  Musculoskeletal:  Positive for arthralgias.  Neurological:  Negative for dizziness and headaches.  Psychiatric/Behavioral:  Negative for dysphoric mood and sleep disturbance. The patient is not nervous/anxious.      Lab Results  Component Value Date   NA 143 01/21/2024   K 5.2 01/21/2024   CO2 25 01/21/2024   GLUCOSE 87 01/21/2024   BUN 18 01/21/2024   CREATININE 1.08 (H) 01/21/2024   CALCIUM  9.8 01/21/2024   EGFR 53 (L) 01/21/2024   GFRNONAA >60 04/23/2021   Lab Results  Component Value Date   CHOL 181 01/21/2024   HDL 85 01/21/2024   LDLCALC 81 01/21/2024   TRIG 81 01/21/2024   CHOLHDL 2.1 01/21/2024   Lab Results  Component Value Date   TSH 1.730 01/21/2024   Lab Results  Component Value Date   HGBA1C 7.8 (A) 09/22/2024   Lab Results  Component Value Date   WBC 7.9 01/21/2024   HGB 12.1 01/21/2024   HCT 37.9 01/21/2024   MCV 93 01/21/2024   PLT 199 01/21/2024   Lab Results  Component Value Date   ALT 10 01/21/2024   AST 17 01/21/2024   ALKPHOS 130 (H) 01/21/2024   BILITOT 0.3 01/21/2024   Lab Results  Component Value Date   VD25OH 35.3 12/25/2020     Patient Active Problem List   Diagnosis Date Noted   Gait disturbance 05/05/2023   Stage 3a chronic kidney disease (CKD) (HCC) 12/30/2022   Trigger finger, left ring finger 12/27/2021    Primary osteoarthritis of both hips 06/10/2019   Bilateral carotid artery stenosis 08/24/2018   Atherosclerosis of aorta 08/24/2018   Type II diabetes mellitus with complication (HCC) 08/18/2018   Bradycardia 07/24/2018   Neoplasm of uncertain behavior of skin 01/16/2017   Senile ecchymosis 08/20/2015   Essential (primary) hypertension 05/08/2015   History of rectal cancer 05/08/2015   Hyperlipidemia associated with type 2 diabetes mellitus (HCC) 05/08/2015   Seizure disorder (HCC) 05/08/2015   Shoulder strain 05/08/2015   Avitaminosis D 05/08/2015    Allergies  Allergen Reactions   Baclofen  Other (See Comments)    Hallucinations   Pioglitazone Nausea Only   Jardiance  [Empagliflozin ] Itching    Yeast vaginitis    Past Surgical History:  Procedure Laterality Date   CATARACT EXTRACTION, BILATERAL  01/2020   Dr. Fleeta [facility]   COLOSTOMY REVERSAL  01/2014   ILEOSTOMY  05/2013   UMBILICAL HERNIA REPAIR  05/2018    Social History   Tobacco Use   Smoking status: Never   Smokeless tobacco: Never  Vaping Use   Vaping status: Never Used  Substance Use Topics   Alcohol use: No    Alcohol/week: 0.0 standard drinks of alcohol   Drug use: No     Medication list has been reviewed and updated.  Current Meds  Medication Sig   aspirin  81 MG  chewable tablet Chew 81 mg by mouth daily.    atorvastatin  (LIPITOR) 40 MG tablet TAKE 1 TABLET BY MOUTH DAILY   Bacillus Coagulans-Inulin (PROBIOTIC) 1-250 BILLION-MG CAPS Take 1 capsule by mouth daily.   Blood Glucose Monitoring Suppl DEVI 1 each by Does not apply route in the morning, at noon, and at bedtime. May substitute to any manufacturer covered by patient's insurance.   calcium  carbonate (OSCAL) 1500 (600 Ca) MG TABS tablet Take 600 mg of elemental calcium  by mouth daily.   carbamazepine  (TEGRETOL ) 200 MG tablet TAKE 1 TABLET BY MOUTH DAILY   Cholecalciferol  25 MCG (1000 UT) capsule Take 1,000 Units by mouth daily.    docusate sodium   (COLACE) 250 MG capsule Take 250 mg by mouth daily.   Fluocinolone Acetonide 0.01 % OIL Place 1 drop into both ears at bedtime. Alternating ears each night   glucose blood (ONETOUCH ULTRA) test strip Use as instructed   Glycerin (OASIS TEARS PF OP) Apply to eye.   metoprolol  succinate (TOPROL -XL) 25 MG 24 hr tablet TAKE 1/2 TABLET(12.5 MG) BY MOUTH TWICE DAILY   nitroGLYCERIN  (NITROSTAT ) 0.4 MG SL tablet Place 1 tablet (0.4 mg total) under the tongue every 5 (five) minutes x 3 doses as needed for chest pain.   pantoprazole  (PROTONIX ) 40 MG tablet Take 1 tablet (40 mg total) by mouth daily as needed (heartburn).   ramipril  (ALTACE ) 10 MG capsule TAKE 1 CAPSULE(10 MG) BY MOUTH DAILY   [DISCONTINUED] Continuous Glucose Sensor (FREESTYLE LIBRE 3 PLUS SENSOR) MISC Change sensor every 15 days.   [DISCONTINUED] insulin  glargine (LANTUS  SOLOSTAR) 100 UNIT/ML Solostar Pen INJECT 30 UNITS UNDER THE SKIN DAILY       05/19/2024   10:11 AM 01/21/2024   10:06 AM 08/19/2023    1:28 PM 05/05/2023    1:22 PM  GAD 7 : Generalized Anxiety Score  Nervous, Anxious, on Edge 3 3 1 1   Control/stop worrying 3 3 1 1   Worry too much - different things 2 2 1 1   Trouble relaxing 1 1 1 1   Restless 1 1 1  0  Easily annoyed or irritable 0 0 0 0  Afraid - awful might happen 1 1 1 1   Total GAD 7 Score 11 11 6 5   Anxiety Difficulty Somewhat difficult Somewhat difficult Somewhat difficult Not difficult at all       05/19/2024   10:11 AM 01/21/2024   10:05 AM 01/13/2024    9:34 AM  Depression screen PHQ 2/9  Decreased Interest 1 2 1   Down, Depressed, Hopeless 1 2 2   PHQ - 2 Score 2 4 3   Altered sleeping 2 2 0  Tired, decreased energy 2 2 2   Change in appetite 0 0 0  Feeling bad or failure about yourself  0 0 0  Trouble concentrating 0 0 0  Moving slowly or fidgety/restless 0 0 0  Suicidal thoughts 0 0 0  PHQ-9 Score 6  8  5    Difficult doing work/chores Not difficult at all Not difficult at all Not difficult at all      Data saved with a previous flowsheet row definition    BP Readings from Last 3 Encounters:  09/22/24 122/70  05/19/24 122/66  01/21/24 136/70    Physical Exam Vitals and nursing note reviewed.  Constitutional:      General: She is not in acute distress.    Appearance: Normal appearance. She is well-developed.  HENT:     Head: Normocephalic and atraumatic.  Cardiovascular:     Rate and Rhythm: Normal rate and regular rhythm.     Heart sounds: No murmur heard. Pulmonary:     Effort: Pulmonary effort is normal. No respiratory distress.     Breath sounds: No wheezing or rhonchi.  Musculoskeletal:     Cervical back: Normal range of motion.     Right lower leg: No edema.     Left lower leg: No edema.  Lymphadenopathy:     Cervical: No cervical adenopathy.  Skin:    General: Skin is warm and dry.     Findings: No rash.  Neurological:     General: No focal deficit present.     Mental Status: She is alert and oriented to person, place, and time.  Psychiatric:        Mood and Affect: Mood normal.        Behavior: Behavior normal.     Wt Readings from Last 3 Encounters:  09/22/24 159 lb (72.1 kg)  05/19/24 156 lb (70.8 kg)  01/21/24 154 lb 2 oz (69.9 kg)    BP 122/70   Pulse 83   Ht 5' 2 (1.575 m)   Wt 159 lb (72.1 kg)   SpO2 97%   BMI 29.08 kg/m   Assessment and Plan:  Problem List Items Addressed This Visit       Unprioritized   Type II diabetes mellitus with complication (HCC) - Primary (Chronic)   Currently medications are insulin  injections.  Occasional nocturnal hypoglycemic episodes noted. She has reduced her PM insulin  dose to 14 units.  AM dose is 20 units. Last visit medical regimen changes were none. Lab Results  Component Value Date   HGBA1C 7.6 (A) 05/19/2024  A1C = 7.8 today.  Will continue current insulin  dose. She may start using [employer] for her CGM supplies.       Relevant Medications   Continuous Glucose Sensor (FREESTYLE LIBRE 3  PLUS SENSOR) MISC   insulin  glargine (LANTUS  SOLOSTAR) 100 UNIT/ML Solostar Pen   Other Relevant Orders   POCT glycosylated hemoglobin (Hb A1C) (Completed)   Other Visit Diagnoses       Long-term insulin  use (HCC)         Encounter for immunization       Relevant Orders   Flu vaccine HIGH DOSE PF(Fluzone Trivalent) (Completed)       Return in about 4 months (around 01/20/2025) for CPX  Dr. Lemon.    Leita HILARIO Adie, MD [facility] [place]

## 2024-09-26 DIAGNOSIS — H6123 Impacted cerumen, bilateral: Secondary | ICD-10-CM | POA: Diagnosis not present

## 2024-09-26 DIAGNOSIS — J300 Vasomotor rhinitis: Secondary | ICD-10-CM | POA: Diagnosis not present

## 2024-10-19 ENCOUNTER — Other Ambulatory Visit: Payer: Self-pay | Admitting: Internal Medicine

## 2024-10-19 DIAGNOSIS — I10 Essential (primary) hypertension: Secondary | ICD-10-CM

## 2024-10-21 NOTE — Telephone Encounter (Signed)
 Requested medications are due for refill today.  yes  Requested medications are on the active medications list.  yes  Last refill. 07/21/2024 #90 0 rf  Future visit scheduled.   yes  Notes to clinic.  Labs are expired.    Requested Prescriptions  Pending Prescriptions Disp Refills   ramipril  (ALTACE ) 10 MG capsule [Pharmacy Med Name: RAMIPRIL  10MG  CAPSULES] 90 capsule 0    Sig: TAKE 1 CAPSULE(10 MG) BY MOUTH DAILY     Cardiovascular:  ACE Inhibitors Failed - 10/21/2024  4:11 PM      Failed - Cr in normal range and within 180 days    Creatinine  Date Value Ref Range Status  05/04/2013 1.01 0.60 - 1.30 mg/dL Final   Creatinine, Ser  Date Value Ref Range Status  01/21/2024 1.08 (H) 0.57 - 1.00 mg/dL Final         Failed - K in normal range and within 180 days    Potassium  Date Value Ref Range Status  01/21/2024 5.2 3.5 - 5.2 mmol/L Final  05/04/2013 4.0 3.5 - 5.1 mmol/L Final         Passed - Patient is not pregnant      Passed - Last BP in normal range    BP Readings from Last 1 Encounters:  09/22/24 122/70         Passed - Valid encounter within last 6 months    Recent Outpatient Visits           4 weeks ago Type II diabetes mellitus with complication [facility])   [place] [facility] at [facility], Leita DEL, MD   5 months ago Type II diabetes mellitus with complication [facility])   [place] [facility] at [facility], Leita DEL, MD   9 months ago Annual physical exam   [facility] at [facility], Leita DEL, MD

## 2024-12-05 ENCOUNTER — Other Ambulatory Visit: Payer: Self-pay

## 2024-12-05 DIAGNOSIS — E118 Type 2 diabetes mellitus with unspecified complications: Secondary | ICD-10-CM

## 2024-12-05 MED ORDER — FREESTYLE LIBRE 3 PLUS SENSOR MISC: 1 refills | Status: AC

## 2024-12-06 ENCOUNTER — Other Ambulatory Visit: Payer: Self-pay

## 2024-12-06 ENCOUNTER — Other Ambulatory Visit: Payer: Self-pay | Admitting: Internal Medicine

## 2024-12-06 DIAGNOSIS — E118 Type 2 diabetes mellitus with unspecified complications: Secondary | ICD-10-CM

## 2024-12-06 MED ORDER — ONETOUCH ULTRA VI STRP: ORAL_STRIP | 3 refills | Status: DC

## 2024-12-06 NOTE — Telephone Encounter (Signed)
 Copied from CRM [id number]. Topic: Clinical - Medication Refill >> Dec 06, 2024 11:05 AM Jasmin G wrote: Medication: Libra sensor and test strips  Has the patient contacted their pharmacy? Yes (Agent: If no, request that the patient contact the pharmacy for the refill. If patient does not wish to contact the pharmacy document the reason why and proceed with request.) (Agent: If yes, when and what did the pharmacy advise?)  This is the patient's preferred pharmacy: [facility] - [place], [place] - [id number] [place] [address] Phone: [phone] Fax: [phone]  Is this the correct pharmacy for this prescription? Yes If no, delete pharmacy and type the correct one.   Has the prescription been filled recently? Yes  Is the patient out of the medication? Yes  Has the patient been seen for an appointment in the last year OR does the patient have an upcoming appointment? Yes  Can we respond through MyChart? No  Agent: Please be advised that Rx refills may take up to 3 business days. We ask that you follow-up with your pharmacy.

## 2024-12-09 ENCOUNTER — Other Ambulatory Visit: Payer: Self-pay

## 2024-12-09 DIAGNOSIS — E118 Type 2 diabetes mellitus with unspecified complications: Secondary | ICD-10-CM

## 2024-12-09 MED ORDER — CONTOUR NEXT TEST VI STRP: ORAL_STRIP | 12 refills | Status: AC

## 2024-12-21 ENCOUNTER — Other Ambulatory Visit: Payer: Self-pay

## 2024-12-21 ENCOUNTER — Other Ambulatory Visit: Payer: Self-pay | Admitting: Student

## 2024-12-21 MED ORDER — METOPROLOL SUCCINATE ER 25 MG PO TB24: ORAL_TABLET | ORAL | 0 refills | Status: AC

## 2024-12-23 NOTE — Telephone Encounter (Signed)
 Requested Prescriptions  Refused Prescriptions Disp Refills   metoprolol  succinate (TOPROL -XL) 25 MG 24 hr tablet [Pharmacy Med Name: METOPROLOL  ER SUCCINATE 25MG  TABS] 90 tablet     Sig: TAKE 1/2 TABLET(12.5 MG) BY MOUTH TWICE DAILY     Cardiovascular:  Beta Blockers Passed - 12/23/2024 10:20 AM      Passed - Last BP in normal range    BP Readings from Last 1 Encounters:  09/22/24 122/70         Passed - Last Heart Rate in normal range    Pulse Readings from Last 1 Encounters:  09/22/24 83         Passed - Valid encounter within last 6 months    Recent Outpatient Visits           3 months ago Type II diabetes mellitus with complication (HCC)   [facility] at [facility], Leita DEL, MD   7 months ago Type II diabetes mellitus with complication [facility])   [place] [facility] at [facility], Leita DEL, MD   11 months ago Annual physical exam   [facility] at [facility], Leita DEL, MD

## 2025-01-18 ENCOUNTER — Ambulatory Visit: Payer: Medicare Other

## 2025-01-19 ENCOUNTER — Ambulatory Visit

## 2025-01-25 ENCOUNTER — Encounter: Admitting: Student
